# Patient Record
Sex: Male | Born: 1950
Health system: Southern US, Community
[De-identification: ages and names within clinical notes are randomized; demographics above are authoritative.]

## PROBLEM LIST (undated history)

## (undated) DIAGNOSIS — H269 Unspecified cataract: Secondary | ICD-10-CM

## (undated) DIAGNOSIS — M47816 Spondylosis without myelopathy or radiculopathy, lumbar region: Secondary | ICD-10-CM

## (undated) DIAGNOSIS — E785 Hyperlipidemia, unspecified: Secondary | ICD-10-CM

## (undated) DIAGNOSIS — K5909 Other constipation: Secondary | ICD-10-CM

## (undated) DIAGNOSIS — K635 Polyp of colon: Secondary | ICD-10-CM

## (undated) DIAGNOSIS — D563 Thalassemia minor: Secondary | ICD-10-CM

## (undated) DIAGNOSIS — M5136 Other intervertebral disc degeneration, lumbar region: Secondary | ICD-10-CM

## (undated) DIAGNOSIS — K627 Radiation proctitis: Secondary | ICD-10-CM

## (undated) DIAGNOSIS — K219 Gastro-esophageal reflux disease without esophagitis: Secondary | ICD-10-CM

## (undated) DIAGNOSIS — C801 Malignant (primary) neoplasm, unspecified: Secondary | ICD-10-CM

## (undated) DIAGNOSIS — R972 Elevated prostate specific antigen [PSA]: Secondary | ICD-10-CM

## (undated) HISTORY — DX: Spondylosis without myelopathy or radiculopathy, lumbar region: M47.816

## (undated) HISTORY — DX: Thalassemia minor: D56.3

## (undated) HISTORY — PX: PROSTATE SURGERY: SHX751

## (undated) HISTORY — DX: Hyperlipidemia, unspecified: E78.5

## (undated) HISTORY — DX: Other intervertebral disc degeneration, lumbar region: M51.36

## (undated) HISTORY — PX: CATARACT EXTRACTION W/ INTRAOCULAR LENS IMPLANT: SHX1309

## (undated) HISTORY — DX: Elevated prostate specific antigen (PSA): R97.20

---

## 2006-03-26 ENCOUNTER — Emergency Department: Payer: Self-pay | Admitting: Emergency Medicine

## 2007-10-02 ENCOUNTER — Other Ambulatory Visit: Payer: Self-pay

## 2007-10-02 ENCOUNTER — Emergency Department: Payer: Self-pay | Admitting: Emergency Medicine

## 2007-10-17 ENCOUNTER — Ambulatory Visit: Payer: Self-pay | Admitting: Emergency Medicine

## 2013-04-23 HISTORY — PX: CATARACT EXTRACTION W/ INTRAOCULAR LENS IMPLANT: SHX1309

## 2013-11-13 LAB — HM COLONOSCOPY

## 2015-09-09 ENCOUNTER — Ambulatory Visit (INDEPENDENT_AMBULATORY_CARE_PROVIDER_SITE_OTHER): Payer: Medicare Other | Admitting: Family Medicine

## 2015-09-09 ENCOUNTER — Encounter: Payer: Self-pay | Admitting: Family Medicine

## 2015-09-09 VITALS — BP 108/66 | HR 61 | Temp 97.7°F | Ht 67.9 in | Wt 140.6 lb

## 2015-09-09 DIAGNOSIS — E785 Hyperlipidemia, unspecified: Secondary | ICD-10-CM

## 2015-09-09 DIAGNOSIS — Z720 Tobacco use: Secondary | ICD-10-CM | POA: Insufficient documentation

## 2015-09-09 DIAGNOSIS — Z860100 Personal history of colon polyps, unspecified: Secondary | ICD-10-CM | POA: Insufficient documentation

## 2015-09-09 DIAGNOSIS — Z23 Encounter for immunization: Secondary | ICD-10-CM | POA: Diagnosis not present

## 2015-09-09 DIAGNOSIS — M47816 Spondylosis without myelopathy or radiculopathy, lumbar region: Secondary | ICD-10-CM | POA: Insufficient documentation

## 2015-09-09 DIAGNOSIS — M5430 Sciatica, unspecified side: Secondary | ICD-10-CM | POA: Insufficient documentation

## 2015-09-09 DIAGNOSIS — M5431 Sciatica, right side: Secondary | ICD-10-CM

## 2015-09-09 DIAGNOSIS — M479 Spondylosis, unspecified: Secondary | ICD-10-CM | POA: Diagnosis not present

## 2015-09-09 NOTE — Patient Instructions (Addendum)
I will suggest that your children talk to their doctors about getting screening colonoscopies starting at age 65, if not earlier based on their other risk factors Ask your [facility] why they stopped the aspirin Request labs and last 2 years of office notes from [place] colonoscopy report and path report form 2 years ago Good luck quitting smoking; it will be the best thing you can do for your health We'll see you in October for your Medicare visit  Back Exercises Back exercises help treat and prevent back injuries. The goal is to increase your strength in your belly (abdominal) and back muscles. These exercises can also help with flexibility. Start these exercises when told by your doctor. HOME CARE Back exercises include: Pelvic Tilt.  Lie on your back with your knees bent. Tilt your pelvis until the lower part of your back is against the floor. Hold this position 5 to 10 sec. Repeat this exercise 5 to 10 times. Knee to Chest.  Pull 1 knee up against your chest and hold for 20 to 30 seconds. Repeat this with the other knee. This may be done with the other leg straight or bent, whichever feels better. Then, pull both knees up against your chest. Sit-Ups or Curl-Ups.  Bend your knees 90 degrees. Start with tilting your pelvis, and do a partial, slow sit-up. Only lift your upper half 30 to 45 degrees off the floor. Take at least 2 to 3 seonds for each sit-up. Do not do sit-ups with your knees out straight. If partial sit-ups are difficult, simply do the above but with only tightening your belly (abdominal) muscles and holding it as told. Hip-Lift.  Lie on your back with your knees flexed 90 degrees. Push down with your feet and shoulders as you raise your hips 2 inches off the floor. Hold for 10 seconds, repeat 5 to 10 times. Back Arches.  Lie on your stomach. Prop yourself up on bent elbows. Slowly press on your hands, causing an arch in your low back. Repeat 3 to 5  times. Shoulder-Lifts.  Lie face down with arms beside your body. Keep hips and belly pressed to floor as you slowly lift your head and shoulders off the floor. Do not overdo your exercises. Be careful in the beginning. Exercises may cause you some mild back discomfort. If the pain lasts for more than 15 minutes, stop the exercises until you see your doctor. Improvement with exercise for back problems is slow.  Document Released: 01/13/2011 Document Revised: 03/04/2012 Document Reviewed: 10/12/2011 [facility] Patient Information 2015 [place], [place]. This information is not intended to replace advice given to you by your health care provider. Make sure you discuss any questions you have with your health care provider. Smoking Cessation, Tips for Success If you are ready to quit smoking, congratulations! You have chosen to help yourself be healthier. Cigarettes bring nicotine, tar, carbon monoxide, and other irritants into your body. Your lungs, heart, and blood vessels will be able to work better without these poisons. There are many different ways to quit smoking. Nicotine gum, nicotine patches, a nicotine inhaler, or nicotine nasal spray can help with physical craving. Hypnosis, support groups, and medicines help break the habit of smoking. WHAT THINGS CAN I DO TO MAKE QUITTING EASIER?  Here are some tips to help you quit for good:  Pick a date when you will quit smoking completely. Tell all of your friends and family about your plan to quit on that date.  Do  not try to slowly cut down on the number of cigarettes you are smoking. Pick a quit date and quit smoking completely starting on that day.  Throw away all cigarettes.   Clean and remove all ashtrays from your home, work, and car.  On a card, write down your reasons for quitting. Carry the card with you and read it when you get the urge to smoke.  Cleanse your body of nicotine. Drink enough water and fluids to keep your urine clear or pale  yellow. Do this after quitting to flush the nicotine from your body.  Learn to predict your moods. Do not let a bad situation be your excuse to have a cigarette. Some situations in your life might tempt you into wanting a cigarette.  Never have "just one" cigarette. It leads to wanting another and another. Remind yourself of your decision to quit.  Change habits associated with smoking. If you smoked while driving or when feeling stressed, try other activities to replace smoking. Stand up when drinking your coffee. Brush your teeth after eating. Sit in a different chair when you read the paper. Avoid alcohol while trying to quit, and try to drink fewer caffeinated beverages. Alcohol and caffeine may urge you to smoke.  Avoid foods and drinks that can trigger a desire to smoke, such as sugary or spicy foods and alcohol.  Ask people who smoke not to smoke around you.  Have something planned to do right after eating or having a cup of coffee. For example, plan to take a walk or exercise.  Try a relaxation exercise to calm you down and decrease your stress. Remember, you may be tense and nervous for the first 2 weeks after you quit, but this will pass.  Find new activities to keep your hands busy. Play with a pen, coin, or rubber band. Doodle or draw things on paper.  Brush your teeth right after eating. This will help cut down on the craving for the taste of tobacco after meals. You can also try mouthwash.   Use oral substitutes in place of cigarettes. Try using lemon drops, carrots, cinnamon sticks, or chewing gum. Keep them handy so they are available when you have the urge to smoke.  When you have the urge to smoke, try deep breathing.  Designate your home as a nonsmoking area.  If you are a heavy smoker, ask your health care provider about a prescription for nicotine chewing gum. It can ease your withdrawal from nicotine.  Reward yourself. Set aside the cigarette money you save and buy  yourself something nice.  Look for support from others. Join a support group or smoking cessation program. Ask someone at home or at work to help you with your plan to quit smoking.  Always ask yourself, "Do I need this cigarette or is this just a reflex?" Tell yourself, "Today, I choose not to smoke," or "I do not want to smoke." You are reminding yourself of your decision to quit.  Do not replace cigarette smoking with electronic cigarettes (commonly called e-cigarettes). The safety of e-cigarettes is unknown, and some may contain harmful chemicals.  If you relapse, do not give up! Plan ahead and think about what you will do the next time you get the urge to smoke. HOW WILL I FEEL WHEN I QUIT SMOKING? You may have symptoms of withdrawal because your body is used to nicotine (the addictive substance in cigarettes). You may crave cigarettes, be irritable, feel very hungry, cough often,  get headaches, or have difficulty concentrating. The withdrawal symptoms are only temporary. They are strongest when you first quit but will go away within 10-14 days. When withdrawal symptoms occur, stay in control. Think about your reasons for quitting. Remind yourself that these are signs that your body is healing and getting used to being without cigarettes. Remember that withdrawal symptoms are easier to treat than the major diseases that smoking can cause.  Even after the withdrawal is over, expect periodic urges to smoke. However, these cravings are generally short lived and will go away whether you smoke or not. Do not smoke! WHAT RESOURCES ARE AVAILABLE TO HELP ME QUIT SMOKING? Your health care provider can direct you to community resources or hospitals for support, which may include:  Group support.  Education.  Hypnosis.  Therapy. Document Released: 09/08/2004 Document Revised: 04/27/2014 Document Reviewed: 05/29/2013 [facility] Patient Information 2015 [place], [place]. This information is not intended  to replace advice given to you by your health care provider. Make sure you discuss any questions you have with your health care provider.

## 2015-09-09 NOTE — Progress Notes (Signed)
BP 108/66 mmHg  Pulse 61  Temp(Src) 97.7 F (36.5 C)  Ht 5' 7.9" (1.725 m)  Wt 140 lb 9.6 oz (63.776 kg)  BMI 21.43 kg/m2  SpO2 97%   Subjective:    Patient ID: Jerry Pena, male    DOB: 1951-10-18, 64 y.o.   MRN: [id number]  HPI: Jerry Pena is a 64 y.o. male  Chief Complaint  Patient presents with  . Establish Care   He has had sciatica on the right side for three years; tt just started out of the blue; he has tingling in the right leg and the right foot actually goes numb Pain is 24/7, ongoing; nothing makes it worse He was taking 8 tylenol a day but that caused constipation; he switched to Aleve 4 a day which helps some; most days the pain is a 10 out of 10; with the Aleve, the pain comes down to a 5 out of 10; no old injuries to back or hip He guesses it is old age; takes 10 minutes to get going in the mornings, lower back feels stiff; painful if he lifts something heavy or moves the wrong way, pain can keep him in bed for 4-5 days; he tries to be careful No known back problems in the family The [place] did xrays of the back and said there was arthritis, xrays were two years ago; he has an orthopaedist Naproxen Rx chewed up his stomach  He also takes Aleve for his right shoulder; either arthritis or rotator cuff; he is supposed to come back for an MRI to get that scheduled; he has a growth on the right shoulder too, fluid or fatty tissue, not sure; no pain from that; no old injuries; he is right-handed; orthopaedist gave him a shot in the shoulder last November, needs to get another shot  He has a history of high cholesterol, "not really high, just a little elevated"; he is on a small dose, 1/2 daily; last labs were done October 2015; no myalgias; no abdominal pain; tries to watch good diet; eats fish/seafood; eats fruits and veggies  He was taking aspirin daily but they stopped that, not sure why; no bleeding; no fam hx of heart disease; he will call to make an appointment to see  his provider and will ask why  He is going to schedule a colonoscopy; he had 10 polyps and they were removed and needed f/u in 2 years; they might have turned into cancer if left alone  Three years ago, had a blood vessel near his eye burst; did $10,000 worth of work; had double vision, left eye would not move; kept him in the hospital for a whole week; after a few weeks it corrected itself; then had cataracts removed in 2014; okay for a while; still having blurred vision; trouble reading; sees [place] eye doctor  Smoking; has patches and gum from the [place]; on the verge of quitting; started smoking at 24; quit for quite a while; 1/3 ppd  Flu shot UTD  Relevant past medical, surgical, family and social history reviewed and updated as indicated. Interim medical history since our last visit reviewed. Family History  Problem Relation Age of Onset  . Cancer Sister     breast  . Cancer Son     prostate   Allergies and medications reviewed and updated.  Review of Systems  HENT: Negative for nosebleeds.   Eyes: Positive for visual disturbance ("my eyes are terrible").  Respiratory: Negative for shortness of breath.  Cardiovascular: Negative for chest pain.  Gastrointestinal: Negative for abdominal pain and blood in stool.  Musculoskeletal:       Right shoulder pain; limited reaching behind his back; no weakness  Neurological: Positive for numbness (right leg).  Per HPI unless specifically indicated above     Objective:    BP 108/66 mmHg  Pulse 61  Temp(Src) 97.7 F (36.5 C)  Ht 5' 7.9" (1.725 m)  Wt 140 lb 9.6 oz (63.776 kg)  BMI 21.43 kg/m2  SpO2 97%  Wt Readings from Last 3 Encounters:  09/09/15 140 lb 9.6 oz (63.776 kg)    Physical Exam  Constitutional: He appears well-developed and well-nourished. No distress.  HENT:  Head: Normocephalic and atraumatic.  Eyes: EOM are normal. No scleral icterus.  Neck: No thyromegaly present.  Cardiovascular: Normal rate and regular rhythm.    Pulmonary/Chest: Effort normal and breath sounds normal.  Abdominal: Soft. Bowel sounds are normal. He exhibits no distension.  Musculoskeletal: He exhibits no edema.       Right shoulder: He exhibits decreased range of motion.  Neurological: Coordination normal.  Skin: Skin is warm and dry. No pallor.  Psychiatric: He has a normal mood and affect. His behavior is normal. Judgment and thought content normal.    No results found for this or any previous visit.    Assessment & Plan:   Problem List Items Addressed This Visit      Nervous and Auditory   Sciatica - Primary    [facility] by [facility], [facility]        Musculoskeletal and Integument   Arthritis, lumbar spine    Treated by [facility], ortho        Other   Hyperlipidemia    Managed by [facility]; labs done through [place]; encouraged healthy diet      Relevant Medications   atorvastatin (LIPITOR) 20 MG tablet   Tobacco abuse     Encouraged cessation; see AVS for tips given in hand-out      History of colon polyps    F/[facility] colonoscopy with [facility]       Other Visit Diagnoses    Immunization due        Relevant Orders    Flu Vaccine QUAD 36+ mos PF IM (Fluarix & Fluzone Quad PF) (Completed)        Follow up plan: Return in about 3 weeks (around 09/28/2015) for Medicare visit.  An after-visit summary was printed and given to the patient at [place].  Please see the patient instructions which may contain other information and recommendations beyond what is mentioned above in the assessment and plan.

## 2015-09-15 NOTE — Assessment & Plan Note (Signed)
[facility] by [facility]

## 2015-09-15 NOTE — Assessment & Plan Note (Signed)
Managed by [facility]; labs done through [place]; encouraged healthy diet

## 2015-09-15 NOTE — Assessment & Plan Note (Signed)
F/[facility]oscopy with [facility]

## 2015-09-15 NOTE — Assessment & Plan Note (Addendum)
Encouraged cessation; see AVS for tips given in hand-out

## 2015-09-28 ENCOUNTER — Encounter: Payer: Self-pay | Admitting: Family Medicine

## 2015-09-28 ENCOUNTER — Ambulatory Visit (INDEPENDENT_AMBULATORY_CARE_PROVIDER_SITE_OTHER): Payer: Medicare Other | Admitting: Family Medicine

## 2015-09-28 VITALS — BP 105/69 | HR 66 | Temp 96.1°F | Ht 67.25 in | Wt 135.6 lb

## 2015-09-28 DIAGNOSIS — Z720 Tobacco use: Secondary | ICD-10-CM

## 2015-09-28 DIAGNOSIS — Z86010 Personal history of colonic polyps: Secondary | ICD-10-CM

## 2015-09-28 DIAGNOSIS — Z0000 Encounter for general adult medical examination without abnormal findings: Secondary | ICD-10-CM | POA: Insufficient documentation

## 2015-09-28 NOTE — Progress Notes (Signed)
Patient: Jerry Pena, Male    DOB: 1951/10/01, 64 y.o.   MRN: [id number]  Visit Date: 10/03/2015  Today's Provider: Enid Derry, MD   Chief Complaint  Patient presents with  . Medicare Wellness    Pt. reports no problems.    Subjective:   Jerry Pena is a 64 y.o. male who presents today for his [facility] [employer] Visit.  Caregiver input:  n/a  HPI  Here for Medicare Wellness visit  Review of Systems  Constitutional: Negative for unexpected weight change (fluctuates 4-5 pounds either way).  HENT: Negative for hearing loss.   Respiratory: Negative for shortness of breath and wheezing.   Cardiovascular: Negative for chest pain.  Gastrointestinal: Negative for blood in stool.  Genitourinary: Negative for hematuria, decreased urine volume and difficulty urinating.  Musculoskeletal: Positive for arthralgias (3rd MCP on the left hand swelled up; saw [place] staff; no problem for 2 years).  Skin: Negative for rash.  Allergic/Immunologic: Negative for food allergies.  Neurological: Negative for tremors, speech difficulty, light-headedness and headaches.  Psychiatric/Behavioral: Positive for sleep disturbance (sleeps 4-5 hours a night and he's good). Negative for confusion, dysphoric mood and decreased concentration.   USPSTF grade A and B recommendations reviewed in detail together Alcohol; 3-4 beers per week HIV and Hep: testing politely declined by patient Colonoscopy: next due Nov 2016 Flu shot: UTD He had a pneumonia shot: Last year or year before last but not does know what kind (not sure if PCV-13 or PPSV-23); that was through the [place] Exercise: active Diet: eats quite a bit of salad, loves his greens; eats a lot of [place], [place] bacon instead of other meats; lots of seafood; lots of milk, also cheese, yogurt; shredded wheat cereal, raisin bran Tobacco: on patches and gum to help quit AAA: the [facility] ran some dye in him when he was having a problem with the blood vessel that  ruptured in his temple; they sent him through a scanner and had dye running through  Breast: sister had cancer, no lumps Tobacco: less than 30 pack years Aspirin: taking aspirin but the [facility] stopped that 3 years ago, he had some ruptured blood vessel Skin: no worrisome moles  Mother lived to 92 years old; died of a stroke Patient's cholesterol is borderline elevated; they gave him 1/2 pill of cholesterol med at the [place]; no problems Father lived to 20 years old; he was healthy up until the end, natural causes History reviewed. No pertinent past surgical history.  Family History  Problem Relation Age of Onset  . Cancer Sister     breast  . Cancer Son     prostate    Social History   Social History  . Marital Status: Married    Spouse Name: N/A  . Number of Children: N/A  . Years of Education: N/A   Occupational History  . Not on file.   Social History Main Topics  . Smoking status: Light Tobacco Smoker    Types: Cigarettes  . Smokeless tobacco: Never Used  . Alcohol Use: 2.4 oz/week    4 Cans of beer, 0 Standard drinks or equivalent per week  . Drug Use: No  . Sexual Activity: Not on file   Other Topics Concern  . Not on file   Social History Narrative    Outpatient Encounter Prescriptions as of 09/28/2015  Medication Sig  . atorvastatin (LIPITOR) 20 MG tablet Take by mouth. Takes .5 tablet daily  . docusate sodium (COLACE) 50 MG capsule  Take by mouth 2 (two) times daily. Take 2 tablets by mouth twice daily  . pantoprazole (PROTONIX) 40 MG tablet Take 40 mg by mouth daily.   No facility-administered encounter medications on file as of 09/28/2015.    Functional Ability / Safety Screening 1.  Was the timed Get Up and Go test longer than 30 seconds?  no 2.  Does the patient need help with the phone, transportation, shopping,      preparing meals, housework, laundry, medications, or managing money?  no 3.  Does the patient's home have:  loose throw rugs in the hallway?    no      Grab bars in the bathroom? yes      Handrails on the stairs?   n/a      Poor lighting?   no 4.  Has the patient noticed any hearing difficulties?   no  Fall Risk Assessment See under rooming  Depression Screen See under rooming Depression screen [facility] 2/9 09/09/2015  Decreased Interest 0  Down, Depressed, Hopeless 0  PHQ - 2 Score 0   Advanced Directives Does patient have a HCPOA?    yes If yes, name and contact information: oldest son has the info, lives in [place], [phone], [place] Does patient have a living will or MOST form?  yes  Objective:   Vitals: BP 105/69 mmHg  Pulse 66  Temp(Src) 96.1 F (35.6 C)  Ht 5' 7.25" (1.708 m)  Wt 135 lb 9.6 oz (61.508 kg)  BMI 21.08 kg/m2  SpO2 97% Body mass index is 21.08 kg/(m^2).  Visual Acuity Screening   Right eye Left eye Both eyes  Without correction: 20/20 20/20 20/20   With correction:       Physical Exam  Constitutional: He appears well-developed and well-nourished.  HENT:  Head: Normocephalic and atraumatic.  Mouth/Throat: Oropharynx is clear and moist.  Eyes: EOM are normal.  Neck: Neck supple. Carotid bruit is not present. No thyromegaly present.  Cardiovascular: Normal rate and regular rhythm.   Pulmonary/Chest: Effort normal and breath sounds normal.  Abdominal: Soft. He exhibits no distension.  Musculoskeletal: He exhibits no edema.  Neurological: He is alert. He displays no tremor. Gait normal.  Skin: Skin is warm.  Psychiatric: He has a normal mood and affect. His behavior is normal. Judgment and thought content normal.   Mood/affect:  euthymic Appearance:  Casually dressed  Cognitive Testing - 6-CIT  Correct? Score   What year is it? yes 0 Yes = 0    No = 4  What month is it? yes 0 Yes = 0    No = 3  Remember:     [address] [place], [place], [place]     What time is it? yes 0 Yes = 0    No = 3  Count backwards from 20 to 1 yes 0 Correct = 0    1 error = 2   More than 1 error = 4  Say  the months of the year in reverse. yes 0 Correct = 0    1 error = 2   More than 1 error = 4  What address did I ask you to remember? yes 0 Correct = 0  1 error = 2    2 error = 4    3 error = 6    4 error = 8    All wrong = 10       TOTAL SCORE  0/28   Interpretation:  Normal  Normal (0-7) Abnormal (8-28)    Assessment & Plan:     Annual Wellness Visit  Reviewed patient's Family Medical History Reviewed and updated list of patient's medical providers Assessment of cognitive impairment was done Assessed patient's functional ability Established a written schedule for health screening [facility] Completed and Reviewed  Immunization History  Administered Date(s) Administered  . Influenza,inj,Quad PF,36+ Mos 09/09/2015    Health Maintenance  Topic Date Due  . Hepatitis C Screening  [birth year]  . HIV Screening  02/13/1965  . PNA vac Low Risk Adult (2 of 2 - PPSV23) 06/25/2015  . COLONOSCOPY  10/26/2015  . INFLUENZA VACCINE  07/25/2016  . TETANUS/TDAP  06/24/2024  . ZOSTAVAX  Addressed   Discussed health benefits of physical activity, and encouraged him to engage in regular exercise appropriate for his age and condition.    Current outpatient prescriptions:  .  atorvastatin (LIPITOR) 20 MG tablet, Take by mouth. Takes .5 tablet daily, Disp: , Rfl:  .  docusate sodium (COLACE) 50 MG capsule, Take by mouth 2 (two) times daily. Take 2 tablets by mouth twice daily, Disp: , Rfl:  .  pantoprazole (PROTONIX) 40 MG tablet, Take 40 mg by mouth daily., Disp: , Rfl:  There are no discontinued medications.  Next Medicare Wellness Visit in 12+ months  EKG machine was not working, so patient will return for baseline EKG when repaired/replaced  Problem List Items Addressed This Visit      Other   Tobacco abuse    He is actively working on cessation, encouragement given      History of colon polyps    Next colonoscopy due in Nov, done through [place]       Preventative health care - Primary    USPSTF grade A and B recommendations reviewed with patient; age-appropriate recommendations, preventive care, screening tests, etc discussed and encouraged; healthy living encouraged; see AVS for patient education given to patient; return for EKG; request records from [place] to see if PCV-13 vaccine needs to be given        An after-visit summary was printed and given to the patient at [place].  Please see the patient instructions which may contain other information and recommendations beyond what is mentioned above in the assessment and plan.

## 2015-09-28 NOTE — Patient Instructions (Addendum)
Request vaccine record from the [place] office notes from the [place] since 2012 If you received the PPSV-23 (Pneumovax), then you should get a booster of that five years or more from the date of the last shot If you have not already had the PCV-13 (Prevnar), then you should get that shot next Request the tests done at the [place], everything since 2012 Request labs done at the [place] since 2014 We'll see what those show and get back to you If you have not heard back from me in 3 weeks about your PSA and vaccines, then please call me to follow-up  We'll have you return for an EKG when our machine is up and running Return in one year for your next Medicare Wellness visit  Health Maintenance  Topic Date Due  . Hepatitis C Screening  [birth year]  . HIV Screening  02/[birth year]/1965  . PNA vac Low Risk Adult (2 of 2 - PPSV23) 06/25/2015  . COLONOSCOPY  10/26/2015  . INFLUENZA VACCINE  07/25/2016  . TETANUS/TDAP  06/24/2024  . [place] Maintenance A healthy lifestyle and preventative care can promote health and wellness.  Maintain regular health, dental, and eye exams.  Eat a healthy diet. Foods like vegetables, fruits, whole grains, low-fat dairy products, and lean protein foods contain the nutrients you need and are low in calories. Decrease your intake of foods high in solid fats, added sugars, and salt. Get information about a proper diet from your health care provider, if necessary.  Regular physical exercise is one of the most important things you can do for your health. Most adults should get at least 150 minutes of moderate-intensity exercise (any activity that increases your heart rate and causes you to sweat) each week. In addition, most adults need muscle-strengthening exercises on 2 or more days a week.   Maintain a healthy weight. The body mass index (BMI) is a screening tool to identify possible weight problems. It provides an estimate of body fat based on height and weight.  Your health care provider can find your BMI and can help you achieve or maintain a healthy weight. For males 20 years and older:  A BMI below 18.5 is considered underweight.  A BMI of 18.5 to 24.9 is normal.  A BMI of 25 to 29.9 is considered overweight.  A BMI of 30 and above is considered obese.  Maintain normal blood lipids and cholesterol by exercising and minimizing your intake of saturated fat. Eat a balanced diet with plenty of fruits and vegetables. Blood tests for lipids and cholesterol should begin at age 59 and be repeated every 5 years. If your lipid or cholesterol levels are high, you are over age 4, or you are at high risk for heart disease, you may need your cholesterol levels checked more frequently.Ongoing high lipid and cholesterol levels should be treated with medicines if diet and exercise are not working.  If you smoke, find out from your health care provider how to quit. If you do not use tobacco, do not start.  Lung cancer screening is recommended for adults aged 22-80 years who are at high risk for developing lung cancer because of a history of smoking. A yearly low-dose CT scan of the lungs is recommended for people who have at least a 30-pack-year history of smoking and are current smokers or have quit within the past 15 years. A pack year of smoking is smoking an average of 1 pack of cigarettes a  day for 1 year (for example, a 30-pack-year history of smoking could mean smoking 1 pack a day for 30 years or 2 packs a day for 15 years). Yearly screening should continue until the smoker has stopped smoking for at least 15 years. Yearly screening should be stopped for people who develop a health problem that would prevent them from having lung cancer treatment.  If you choose to drink alcohol, do not have more than 2 drinks per day. One drink is considered to be 12 oz (360 mL) of beer, 5 oz (150 mL) of wine, or 1.5 oz (45 mL) of liquor.  Avoid the use of street drugs. Do not  share needles with anyone. Ask for help if you need support or instructions about stopping the use of drugs.  High blood pressure causes heart disease and increases the risk of stroke. Blood pressure should be checked at least every 1-2 years. Ongoing high blood pressure should be treated with medicines if weight loss and exercise are not effective.  If you are 31-77 years old, ask your health care provider if you should take aspirin to prevent heart disease.  Diabetes screening involves taking a blood sample to check your fasting blood sugar level. This should be done once every 3 years after age 31 if you are at a normal weight and without risk factors for diabetes. Testing should be considered at a younger age or be carried out more frequently if you are overweight and have at least 1 risk factor for diabetes.  Colorectal cancer can be detected and often prevented. Most routine colorectal cancer screening begins at the age of 36 and continues through age 69. However, your health care provider may recommend screening at an earlier age if you have risk factors for colon cancer. On a yearly basis, your health care provider may provide home test kits to check for hidden blood in the stool. A small camera at the end of a tube may be used to directly examine the colon (sigmoidoscopy or colonoscopy) to detect the earliest forms of colorectal cancer. Talk to your health care provider about this at age 71 when routine screening begins. A direct exam of the colon should be repeated every 5-10 years through age 72, unless early forms of precancerous polyps or small growths are found.  People who are at an increased risk for hepatitis B should be screened for this virus. You are considered at high risk for hepatitis B if:  You were born in a country where hepatitis B occurs often. Talk with your health care provider about which countries are considered high risk.  Your parents were born in a high-risk country  and you have not received a shot to protect against hepatitis B (hepatitis B vaccine).  You have HIV or AIDS.  You use needles to inject street drugs.  You live with, or have sex with, someone who has hepatitis B.  You are a man who has sex with other men (MSM).  You get hemodialysis treatment.  You take certain medicines for conditions like cancer, organ transplantation, and autoimmune conditions.  Hepatitis C blood testing is recommended for all people born from [birth year] through 1966 and any individual with known risk factors for hepatitis C.  Healthy men should no longer receive prostate-specific antigen (PSA) blood tests as part of routine cancer screening. Talk to your health care provider about prostate cancer screening.  Testicular cancer screening is not recommended for adolescents or adult males who have no  symptoms. Screening includes self-exam, a health care provider exam, and other screening tests. Consult with your health care provider about any symptoms you have or any concerns you have about testicular cancer.  Practice safe sex. Use condoms and avoid high-risk sexual practices to reduce the spread of sexually transmitted infections (STIs).  You should be screened for STIs, including gonorrhea and chlamydia if:  You are sexually active and are younger than 24 years.  You are older than 24 years, and your health care provider tells you that you are at risk for this type of infection.  Your sexual activity has changed since you were last screened, and you are at an increased risk for chlamydia or gonorrhea. Ask your health care provider if you are at risk.  If you are at risk of being infected with HIV, it is recommended that you take a prescription medicine daily to prevent HIV infection. This is called pre-exposure prophylaxis (PrEP). You are considered at risk if:  You are a man who has sex with other men (MSM).  You are a heterosexual man who is sexually active with  multiple partners.  You take drugs by injection.  You are sexually active with a partner who has HIV.  Talk with your health care provider about whether you are at high risk of being infected with HIV. If you choose to begin PrEP, you should first be tested for HIV. You should then be tested every 3 months for as long as you are taking PrEP.  Use sunscreen. Apply sunscreen liberally and repeatedly throughout the day. You should seek shade when your shadow is shorter than you. Protect yourself by wearing long sleeves, pants, a wide-brimmed hat, and sunglasses year round whenever you are outdoors.  Tell your health care provider of new moles or changes in moles, especially if there is a change in shape or color. Also, tell your health care provider if a mole is larger than the size of a pencil eraser.  A one-time screening for abdominal aortic aneurysm (AAA) and surgical repair of large AAAs by ultrasound is recommended for men aged 71-75 years who are current or former smokers.  Stay current with your vaccines (immunizations). Document Released: 06/08/2008 Document Revised: 12/16/2013 Document Reviewed: 05/08/2011 [facility] Patient Information 2015 [place], [place]. This information is not intended to replace advice given to you by your health care provider. Make sure you discuss any questions you have with your health care provider.

## 2015-10-03 NOTE — Assessment & Plan Note (Signed)
Next colonoscopy due in Nov, done through [place]

## 2015-10-03 NOTE — Assessment & Plan Note (Signed)
He is actively working on cessation, encouragement given

## 2015-10-03 NOTE — Assessment & Plan Note (Signed)
USPSTF grade A and B recommendations reviewed with patient; age-appropriate recommendations, preventive care, screening tests, etc discussed and encouraged; healthy living encouraged; see AVS for patient education given to patient; return for EKG; request records from [place] to see if PCV-13 vaccine needs to be given

## 2015-10-14 ENCOUNTER — Telehealth: Payer: Self-pay

## 2015-10-14 NOTE — Telephone Encounter (Signed)
Called patient and notified him that Dr. Sanda Klein said that prescription would have to from the doctor at the [facility]. He agreed and understood. I asked if he had any questions and he asked if he had to get a referral for an eye exam and I told him he did not.

## 2015-10-14 NOTE — Telephone Encounter (Signed)
That needs to go to the doctor who treats him for that; it's not me; it's someone at the [place]; I won't do that

## 2015-10-14 NOTE — Telephone Encounter (Signed)
Fax received from [facility]. Fax says "Patient authorized our pharmacy to contact you on behalf of their order. After speaking with your patient, we have preselected the medication we suggest will help manage their symptoms."  The directions for the medication listed on the fax are: Lidocane 5% Apply 1g to affected area 4 times daily.  Diclofenac Sodium Solution 1.5% Apply 40 drops to affected areas up to 4 times daily.

## 2017-06-06 ENCOUNTER — Ambulatory Visit: Payer: Self-pay | Admitting: Family Medicine

## 2017-06-13 ENCOUNTER — Ambulatory Visit: Payer: Self-pay | Admitting: Family Medicine

## 2017-06-13 ENCOUNTER — Ambulatory Visit (INDEPENDENT_AMBULATORY_CARE_PROVIDER_SITE_OTHER): Payer: Medicare HMO | Admitting: Family Medicine

## 2017-06-13 ENCOUNTER — Encounter: Payer: Self-pay | Admitting: Family Medicine

## 2017-06-13 ENCOUNTER — Ambulatory Visit
Admission: RE | Admit: 2017-06-13 | Discharge: 2017-06-13 | Disposition: A | Discharge status: Home / Self Care | Payer: Medicare HMO | Source: Ambulatory Visit | Attending: Family Medicine | Admitting: Family Medicine

## 2017-06-13 ENCOUNTER — Other Ambulatory Visit: Payer: Self-pay

## 2017-06-13 VITALS — BP 108/64 | HR 94 | Temp 97.9°F | Resp 14 | Wt 136.2 lb

## 2017-06-13 DIAGNOSIS — M5441 Lumbago with sciatica, right side: Secondary | ICD-10-CM

## 2017-06-13 DIAGNOSIS — L821 Other seborrheic keratosis: Secondary | ICD-10-CM

## 2017-06-13 DIAGNOSIS — M51369 Other intervertebral disc degeneration, lumbar region without mention of lumbar back pain or lower extremity pain: Secondary | ICD-10-CM | POA: Insufficient documentation

## 2017-06-13 DIAGNOSIS — Z125 Encounter for screening for malignant neoplasm of prostate: Secondary | ICD-10-CM | POA: Insufficient documentation

## 2017-06-13 DIAGNOSIS — M5136 Other intervertebral disc degeneration, lumbar region: Secondary | ICD-10-CM

## 2017-06-13 DIAGNOSIS — Z86010 Personal history of colonic polyps: Secondary | ICD-10-CM | POA: Diagnosis not present

## 2017-06-13 DIAGNOSIS — Z5181 Encounter for therapeutic drug level monitoring: Secondary | ICD-10-CM

## 2017-06-13 DIAGNOSIS — Z1159 Encounter for screening for other viral diseases: Secondary | ICD-10-CM

## 2017-06-13 DIAGNOSIS — Z720 Tobacco use: Secondary | ICD-10-CM | POA: Diagnosis not present

## 2017-06-13 DIAGNOSIS — Z23 Encounter for immunization: Secondary | ICD-10-CM | POA: Diagnosis not present

## 2017-06-13 DIAGNOSIS — E782 Mixed hyperlipidemia: Secondary | ICD-10-CM

## 2017-06-13 DIAGNOSIS — G8929 Other chronic pain: Secondary | ICD-10-CM | POA: Diagnosis not present

## 2017-06-13 DIAGNOSIS — M47816 Spondylosis without myelopathy or radiculopathy, lumbar region: Secondary | ICD-10-CM | POA: Insufficient documentation

## 2017-06-13 DIAGNOSIS — M545 Low back pain: Secondary | ICD-10-CM | POA: Diagnosis not present

## 2017-06-13 DIAGNOSIS — Z1211 Encounter for screening for malignant neoplasm of colon: Secondary | ICD-10-CM | POA: Diagnosis not present

## 2017-06-13 DIAGNOSIS — M2578 Osteophyte, vertebrae: Secondary | ICD-10-CM | POA: Diagnosis not present

## 2017-06-13 HISTORY — DX: Other intervertebral disc degeneration, lumbar region: M51.36

## 2017-06-13 HISTORY — DX: Other intervertebral disc degeneration, lumbar region without mention of lumbar back pain or lower extremity pain: M51.369

## 2017-06-13 HISTORY — DX: Spondylosis without myelopathy or radiculopathy, lumbar region: M47.816

## 2017-06-13 NOTE — Assessment & Plan Note (Signed)
Check fasting labs another day; encouraged diet low in saturated fats

## 2017-06-13 NOTE — Patient Instructions (Addendum)
Please call [phone] to contact [employer] about their home care management service We'll give you the PCV-13 vaccine today against Pneumonia See your pharmacist for the Shingrix (shingles vaccine) in one month or after Return for fasting labs in the next few weeks Nothing with calories after midnight before you come I've put in referrals for the dermatologist and the gastroenterologist If you have not heard anything from my staff in a week about any orders/referrals/studies from today, please contact us here to follow-up [phone] Try to limit saturated fats in your diet (bologna, hot dogs, barbeque, cheeseburgers, hamburgers, steak, bacon, sausage, cheese, etc.) and get more fresh fruits, vegetables, and whole grains

## 2017-06-13 NOTE — Assessment & Plan Note (Signed)
Check PSA when he returns for labs

## 2017-06-13 NOTE — Assessment & Plan Note (Signed)
Not rechecked, overdue; he would like to see someone here in [place]

## 2017-06-13 NOTE — Assessment & Plan Note (Signed)
Encouraged complete smoking cessation.  

## 2017-06-13 NOTE — Progress Notes (Signed)
BP 108/64   Pulse 94   Temp 97.9 F (36.6 C) (Oral)   Resp 14   Wt 136 lb 3.2 oz (61.8 kg)   SpO2 95%   BMI 21.17 kg/m    Subjective:    Patient ID: Jerry Pena, male    DOB: 1951-05-08, 66 y.o.   MRN: [id number]  HPI: Jerry Pena is a 66 y.o. male  Chief Complaint  Patient presents with  . Follow-up    regarding paperwork from ins company   HPI Patient is here to fill out paperwork for his insurance company; see form He needs PCV-13; no hx of that being given He smokes about 3 cigarettes a month; never more than a pack a week smoker Patient does not have diabetes; no hx of heart disease No recent hospital visits High cholesterol; no abd pain, no muscle aches, just Rx expired; not fasting today Eats a lot of fish and chicken; eats a lot of cheese; does eat eggs; does eat dairy, whole milk to 2% Stays active; eats a lot of shredded wheat Has HCPOA and living will Right foot pain going on for a long time; callus or wart; treated with freeze on, but comes back; gets really tender; just for blanket to touch is sore; left foot is fine; keeps a bandaid on it to keep it soft; using tylenol for pain Has pain in the right leg; all the way down the leg; numbness and tingling; bad some days; pain in the lower back; worse with some movements; iif he bends too fast, aggrevates him for 2 weeks; denies B/B dysfunction  Depression screen [facility] 2/9 06/13/2017 09/09/2015  Decreased Interest 0 0  Down, Depressed, Hopeless 0 0  PHQ - 2 Score 0 0   Relevant past medical, surgical, family and social history reviewed Past Medical History:  Diagnosis Date  . Hyperlipidemia    No past surgical history on file.   Family History  Problem Relation Age of Onset  . Cancer Sister        breast  . Cancer Son        prostate   Social History   Social History  . Marital status: Married    Spouse name: N/A  . Number of children: N/A  . Years of education: N/A   Occupational History  . Not  on file.   Social History Main Topics  . Smoking status: Light Tobacco Smoker    Types: Cigarettes  . Smokeless tobacco: Never Used  . Alcohol use 2.4 oz/week    4 Cans of beer per week  . Drug use: No  . Sexual activity: Not on file   Other Topics Concern  . Not on file   Social History Narrative  . No narrative on file   Interim medical history since last visit reviewed. Allergies and medications reviewed  Review of Systems Per HPI unless specifically indicated above     Objective:    BP 108/64   Pulse 94   Temp 97.9 F (36.6 C) (Oral)   Resp 14   Wt 136 lb 3.2 oz (61.8 kg)   SpO2 95%   BMI 21.17 kg/m   Wt Readings from Last 3 Encounters:  06/13/17 136 lb 3.2 oz (61.8 kg)  09/28/15 135 lb 9.6 oz (61.5 kg)  09/09/15 140 lb 9.6 oz (63.8 kg)    Physical Exam  Constitutional: He appears well-developed and well-nourished. No distress.  Eyes: No scleral icterus.  Cardiovascular: Normal rate.  Pulmonary/Chest: Effort normal.  Abdominal: He exhibits no distension.  Musculoskeletal:       Lumbar back: He exhibits tenderness. He exhibits no swelling, no edema, no deformity and no spasm.  Neurological: He is alert.  Reflex Scores:      Patellar reflexes are 2+ on the right side and 2+ on the left side. LE strength 5/5  Skin: He is not diaphoretic. No pallor.  Verrucous papule on the extensor surface of the 3rd toe on the right foot; tender; no proximal red streaks  Psychiatric: He has a normal mood and affect.    No results found for this or any previous visit.    Assessment & Plan:   Problem List Items Addressed This Visit      Other   Tobacco abuse    Encouraged complete smoking cessation      Prostate cancer screening    Check PSA when he returns for labs      Relevant Orders   PSA   Medication monitoring encounter   Relevant Orders   COMPLETE METABOLIC PANEL WITH GFR   Hyperlipidemia - Primary    Check fasting labs another day; encouraged diet  low in saturated fats      Relevant Orders   Lipid panel   History of colon polyps    Not rechecked, overdue; he would like to see someone here in [place]       Other Visit Diagnoses    Screen for colon cancer       Relevant Orders   Ambulatory referral to Gastroenterology   Need for shingles vaccine       get this in one month at local pharmacy   Need for pneumococcal vaccination       Relevant Orders   Pneumococcal conjugate vaccine 13-valent (Completed)   Verrucous papule       Relevant Orders   Ambulatory referral to Dermatology   Chronic right-sided low back pain with right-sided sciatica       will start with xray and PT, then on to MRI or referral if not improving; always take tylenol per package directions, not to excess   Relevant Medications   acetaminophen (TYLENOL) 500 MG tablet   Other Relevant Orders   DG Lumbar Spine Complete (Completed)   Ambulatory referral to Physical Therapy      Follow up plan: Return in about 1 year (around 06/13/2018) for Medicare Wellness check.  An after-visit summary was printed and given to the patient at [place].  Please see the patient instructions which may contain other information and recommendations beyond what is mentioned above in the assessment and plan.  Meds ordered this encounter  Medications  . acetaminophen (TYLENOL) 500 MG tablet    Sig: Take 500 mg by mouth every 6 (six) hours as needed.    Orders Placed This Encounter  Procedures  . DG Lumbar Spine Complete  . Pneumococcal conjugate vaccine 13-valent  . COMPLETE METABOLIC PANEL WITH GFR  . Lipid panel  . PSA  . Ambulatory referral to Gastroenterology  . Ambulatory referral to Dermatology  . Ambulatory referral to Physical Therapy

## 2017-06-22 ENCOUNTER — Telehealth: Payer: Self-pay | Admitting: Family Medicine

## 2017-06-22 ENCOUNTER — Telehealth: Payer: Self-pay

## 2017-06-22 ENCOUNTER — Other Ambulatory Visit: Payer: Self-pay

## 2017-06-22 ENCOUNTER — Encounter: Payer: Self-pay | Admitting: Family Medicine

## 2017-06-22 DIAGNOSIS — Z1211 Encounter for screening for malignant neoplasm of colon: Secondary | ICD-10-CM

## 2017-06-22 NOTE — Telephone Encounter (Signed)
Gastroenterology Pre-Procedure Review  Request Date:  Requesting Physician: Dr.   PATIENT REVIEW QUESTIONS: The patient responded to the following health history questions as indicated:    1. Are you having any GI issues? no 2. Do you have a personal history of Polyps? yes (2014) 3. Do you have a family history of Colon Cancer or Polyps? no 4. Diabetes Mellitus? no 5. Joint replacements in the past 12 months?no 6. Major health problems in the past 3 months?no 7. Any artificial heart valves, MVP, or defibrillator?no    MEDICATIONS & ALLERGIES:    Patient reports the following regarding taking any anticoagulation/antiplatelet therapy:   Plavix, Coumadin, Eliquis, Xarelto, Lovenox, Pradaxa, Brilinta, or Effient? no Aspirin? no  Patient confirms/reports the following medications:  Current Outpatient Prescriptions  Medication Sig Dispense Refill  . acetaminophen (TYLENOL) 500 MG tablet Take 500 mg by mouth every 6 (six) hours as needed.     No current facility-administered medications for this visit.     Patient confirms/reports the following allergies:  Allergies  Allergen Reactions  . Penicillins Hives    No orders of the defined types were placed in this encounter.   AUTHORIZATION INFORMATION Primary Insurance: 1D#: Group #:  Secondary Insurance: 1D#: Group #:  SCHEDULE INFORMATION: Date: 08/02/17 Time: Location: [facility]

## 2017-06-22 NOTE — Telephone Encounter (Signed)
Pt said that the dr told him to call if the pain was any worse. He said that since June 26th he has taken 20 to 24 tylenol and ibuprophen switching back and forth. Please advise. Michela Pitcher he is going to try heating pad.

## 2017-06-22 NOTE — Telephone Encounter (Signed)
Please call and find out if he has been scheduled with Dermatology yet. If not, please have Kristeen Miss look into getting this referral scheduled quickly. If pain is significantly worse, then he needs to be re-seen. Thanks!

## 2017-06-25 NOTE — Telephone Encounter (Signed)
[facility] VOICE MAIL

## 2017-06-26 MED ORDER — PREDNISONE 20 MG PO TABS: ORAL_TABLET | ORAL | 0 refills | Status: AC

## 2017-06-26 NOTE — Telephone Encounter (Signed)
I called number back and he doesn't live there any more I called mobile number; spoke with patient It's not the wart that's causing pain; it's the sciatica He has taken tylenol and ibuprofen Let's start prednisone taper, [facility] confirmed Do NOT take any ibuprofen while on prednisone; okay to take tylenol Call us back if we need to go further (PT, xrays, referral, etc.); he agrees

## 2017-06-26 NOTE — Telephone Encounter (Signed)
Staff -- please remove the [phone] number; he does not live there; thank you

## 2017-06-26 NOTE — Addendum Note (Signed)
Addended by: LADA, Satira Anis on: 06/26/2017 06:26 PM   Modules accepted: Orders

## 2017-07-11 ENCOUNTER — Telehealth: Payer: Self-pay | Admitting: Family Medicine

## 2017-07-11 NOTE — Telephone Encounter (Signed)
Patient notified information sent and was given phone number to [facility]

## 2017-07-11 NOTE — Telephone Encounter (Signed)
PT SAID THAT HE TOOK ALL THE PREDISONE AND THAT HIS PAIN IS BACK AS BAD AS BEFORE. ALSO HE HAS NOT HEARD ANYTHING FROM THE REFERRAL TO THE DERMATOLOGY. PLEASE ADVISE. PT SAID THAT ALL THE REFERRALS HAVE BEEN APPROVED PER HIS INSURANCE COMPANY.

## 2017-07-12 NOTE — Telephone Encounter (Signed)
Left detailed voicemail

## 2017-07-12 NOTE — Telephone Encounter (Signed)
Please suggest he go to the [facility] walk in clinic this afternoon; he likely needs imaging; may have disc problems, pinched nerve, needs to see ortho if my treatments have not given him relief; thank you

## 2017-07-30 ENCOUNTER — Telehealth: Payer: Self-pay

## 2017-07-30 NOTE — Telephone Encounter (Signed)
Patient needed to cancel colonoscopy 08/02/17 due to transportation.  He uses [facility] but did not have anyone to stay with him for procedure.

## 2017-08-02 ENCOUNTER — Ambulatory Visit: Admit: 2017-08-02 | Payer: Medicare HMO | Admitting: Gastroenterology

## 2017-08-02 SURGERY — COLONOSCOPY WITH PROPOFOL
Anesthesia: General

## 2017-08-23 ENCOUNTER — Telehealth: Payer: Self-pay | Admitting: Family Medicine

## 2017-08-23 NOTE — Telephone Encounter (Signed)
Patient notified

## 2017-08-23 NOTE — Telephone Encounter (Signed)
Please ask patient to have the fasting labs done that were ordered in June; thank you

## 2017-08-28 DIAGNOSIS — L84 Corns and callosities: Secondary | ICD-10-CM | POA: Diagnosis not present

## 2017-09-03 ENCOUNTER — Ambulatory Visit (INDEPENDENT_AMBULATORY_CARE_PROVIDER_SITE_OTHER): Payer: Medicare HMO

## 2017-09-03 DIAGNOSIS — Z23 Encounter for immunization: Secondary | ICD-10-CM | POA: Diagnosis not present

## 2017-09-03 DIAGNOSIS — Z125 Encounter for screening for malignant neoplasm of prostate: Secondary | ICD-10-CM | POA: Diagnosis not present

## 2017-09-03 DIAGNOSIS — Z1159 Encounter for screening for other viral diseases: Secondary | ICD-10-CM | POA: Diagnosis not present

## 2017-09-03 DIAGNOSIS — Z5181 Encounter for therapeutic drug level monitoring: Secondary | ICD-10-CM | POA: Diagnosis not present

## 2017-09-03 DIAGNOSIS — E782 Mixed hyperlipidemia: Secondary | ICD-10-CM | POA: Diagnosis not present

## 2017-09-03 LAB — COMPLETE METABOLIC PANEL WITH GFR
AG RATIO: 1.6 (calc) (ref 1.0–2.5)
ALT: 11 U/L (ref 9–46)
AST: 16 U/L (ref 10–35)
Albumin: 4.4 g/dL (ref 3.6–5.1)
Alkaline phosphatase (APISO): 63 U/L (ref 40–115)
BUN: 17 mg/dL (ref 7–25)
CALCIUM: 9.8 mg/dL (ref 8.6–10.3)
CHLORIDE: 105 mmol/L (ref 98–110)
CO2: 30 mmol/L (ref 20–32)
Creat: 1.02 mg/dL (ref 0.70–1.25)
GFR, EST NON AFRICAN AMERICAN: 76 mL/min/{1.73_m2} (ref >=60)
GFR, Est African American: 88 mL/min/{1.73_m2} (ref >=60)
GLOBULIN: 2.7 g/dL (ref 1.9–3.7)
Glucose, Bld: 85 mg/dL (ref 65–99)
POTASSIUM: 4.7 mmol/L (ref 3.5–5.3)
Sodium: 142 mmol/L (ref 135–146)
Total Bilirubin: 0.4 mg/dL (ref 0.2–1.2)
Total Protein: 7.1 g/dL (ref 6.1–8.1)

## 2017-09-03 LAB — PSA: PSA: 4.4 ng/mL — ABNORMAL HIGH (ref <=4.0)

## 2017-09-03 LAB — HEPATITIS C ANTIBODY
HEP C AB: NONREACTIVE
SIGNAL TO CUT-OFF: 0.03 (ref <1.00)

## 2017-09-03 LAB — LIPID PANEL
Cholesterol: 191 mg/dL (ref <200)
HDL: 71 mg/dL (ref >40)
LDL Cholesterol (Calc): 100 mg/dL (calc) — ABNORMAL HIGH
NON-HDL CHOLESTEROL (CALC): 120 mg/dL (ref <130)
Total CHOL/HDL Ratio: 2.7 (calc) (ref <5.0)
Triglycerides: 106 mg/dL (ref <150)

## 2017-09-12 ENCOUNTER — Other Ambulatory Visit: Payer: Self-pay

## 2017-09-12 ENCOUNTER — Encounter: Payer: Self-pay | Admitting: Family Medicine

## 2017-09-12 DIAGNOSIS — R972 Elevated prostate specific antigen [PSA]: Secondary | ICD-10-CM | POA: Insufficient documentation

## 2017-09-12 HISTORY — DX: Elevated prostate specific antigen (PSA): R97.20

## 2017-09-17 ENCOUNTER — Encounter: Payer: Self-pay | Admitting: Family Medicine

## 2017-10-02 ENCOUNTER — Encounter: Payer: Self-pay | Admitting: Urology

## 2017-10-02 ENCOUNTER — Ambulatory Visit (INDEPENDENT_AMBULATORY_CARE_PROVIDER_SITE_OTHER): Payer: Medicare HMO | Admitting: Urology

## 2017-10-02 VITALS — BP 106/69 | HR 66 | Ht 67.25 in | Wt 138.1 lb

## 2017-10-02 DIAGNOSIS — R972 Elevated prostate specific antigen [PSA]: Secondary | ICD-10-CM

## 2017-10-02 LAB — URINALYSIS, COMPLETE
Bilirubin, UA: NEGATIVE
GLUCOSE, UA: NEGATIVE
KETONES UA: NEGATIVE
LEUKOCYTES UA: NEGATIVE
NITRITE UA: NEGATIVE
RBC UA: NEGATIVE
SPEC GRAV UA: 1.025 (ref 1.005–1.030)
UUROB: 0.2 mg/dL (ref 0.2–1.0)
pH, UA: 5.5 (ref 5.0–7.5)

## 2017-10-02 NOTE — Progress Notes (Signed)
10/02/2017 9:59 AM   Arma Heading 1951/01/06 [id number]  Referring provider: Arnetha Courser, MD [address] [place], [place] [address]  Chief Complaint  Patient presents with  . Elevated PSA    HPI: 35 AAM presents for further evaluation and management of an elevated PSA.  This was noted to be elevated as part of a routine annual physical.  He denies any progression of his urinary tract symptoms over the last 6 months.  He has no history of gross hematuria or recurrent UTIs.  He has no significant urologic history.    His son was diagnosed with prostate cancer at the age of 56 (gleason 3+4) and underwent RALP.  SHIM 20 IPSS: 5, QOL 0   PMH: Past Medical History:  Diagnosis Date  . DDD (degenerative disc disease), lumbar 06/13/2017   Lumbar imaging June 2018  . Elevated PSA 09/12/2017   Refer to urologist, Sept 2018  . Facet hypertrophy of lumbar region 06/13/2017   Lumbar imaging June 2018  . Hyperlipidemia     Surgical History: History reviewed. No pertinent surgical history.  Home Medications:  Allergies as of 10/02/2017      Reactions   Penicillins Hives      Medication List       Accurate as of 10/02/17  9:59 AM. Always use your most recent med list.          acetaminophen 500 MG tablet Commonly known as:  TYLENOL Take 500 mg by mouth every 6 (six) hours as needed.       Allergies:  Allergies  Allergen Reactions  . Penicillins Hives    Family History: Family History  Problem Relation Age of Onset  . Cancer Sister        breast  . Cancer Son        prostate  . Prostate cancer Son   . Bladder Cancer Neg Hx   . Kidney cancer Neg Hx     Social History:  reports that he has been smoking Cigarettes.  He has never used smokeless tobacco. He reports that he drinks about 2.4 oz of alcohol per week . He reports that he does not use drugs.  ROS: UROLOGY Frequent Urination?: No Hard to postpone urination?: No Burning/pain with  urination?: No Get up at night to urinate?: No Leakage of urine?: No Urine stream starts and stops?: No Trouble starting stream?: No Do you have to strain to urinate?: No Blood in urine?: No Urinary tract infection?: No Sexually transmitted disease?: No Injury to kidneys or bladder?: No Painful intercourse?: No Weak stream?: No Erection problems?: No Penile pain?: No  Gastrointestinal Nausea?: No Vomiting?: No Indigestion/heartburn?: Yes Diarrhea?: No Constipation?: No  Constitutional Fever: No Night sweats?: No Weight loss?: No Fatigue?: No  Skin Skin rash/lesions?: No Itching?: No  Eyes Blurred vision?: Yes Double vision?: No  Ears/Nose/Throat Sore throat?: No Sinus problems?: No  Hematologic/Lymphatic Swollen glands?: No Easy bruising?: No  Cardiovascular Leg swelling?: No Chest pain?: No  Respiratory Cough?: No Shortness of breath?: No  Endocrine Excessive thirst?: No  Musculoskeletal Back pain?: Yes Joint pain?: Yes  Neurological Headaches?: No Dizziness?: No  Psychologic Depression?: No Anxiety?: No  Physical Exam: BP 106/69 (BP Location: Right Arm, Patient Position: Sitting, Cuff Size: Normal)   Pulse 66   Ht 5' 7.25" (1.708 m)   Wt 62.6 kg (138 lb 1.6 oz)   BMI 21.47 kg/m   Constitutional:  Alert and oriented, No acute distress. HEENT:  [place] AT, moist mucus membranes.  Trachea midline, no masses. Cardiovascular: No clubbing, cyanosis, or edema. Respiratory: Normal respiratory effort, no increased work of breathing. GI: Abdomen is soft, nontender, nondistended, no abdominal masses GU: No CVA tenderness.  DRE: 35gm prostate with nodule on right lateral lob, mid gland Skin: No rashes, bruises or suspicious lesions. Lymph: No cervical or inguinal adenopathy. Neurologic: Grossly intact, no focal deficits, moving all 4 extremities. Psychiatric: Normal mood and affect.  Laboratory Data: No results found for: WBC, HGB, HCT, MCV,  PLT  Lab Results  Component Value Date   CREATININE 1.02 09/03/2017    No results found for: PSA1  No results found for: TESTOSTERONE  No results found for: HGBA1C  Urinalysis No results found for: SPECGRAV, PHUR, COLORU, APPEARANCEUR, LEUKOCYTESUR, PROTEINUR, GLUCOSEU, KETONESU, RBCU, BILIRUBINUR, UUROB, NITRITE  No results found for: LABMICR, WBCUA, RBCUA, LABEPIT, MUCUS, BACTERIA  Pertinent Imaging: none No results found for this or any previous visit. No results found for this or any previous visit. No results found for this or any previous visit. No results found for this or any previous visit. No results found for this or any previous visit. No results found for this or any previous visit. No results found for this or any previous visit. No results found for this or any previous visit.  Assessment & Plan:  Patient with elevated PSA once, no PSA's prior to compare to.  His DRE was concerning for a nodule on the right.  He has a strong family history of prostate cancer.  I am concerned and think he will need a biopsy in the near future.  However, we will check his PSA today and a again in 3 months.  At that visit we will repeat his rectal exam.  I did discuss prostate biopsy with him briefly.  He understands that next visit will likely be further discussion about this.  1. Elevated PSA  - Urinalysis, Complete - PSA   No Follow-up on file.  Ardis Hughs, [facility] [address] [place], [place] [address] [phone]

## 2017-10-03 LAB — PSA: PROSTATE SPECIFIC AG, SERUM: 7.5 ng/mL — AB (ref 0.0–4.0)

## 2017-10-15 ENCOUNTER — Telehealth: Payer: Self-pay

## 2017-10-15 NOTE — Telephone Encounter (Signed)
Called patient and notified of vmail patient was previously scheduled and has a prostate biopsy tomorrow he will keep apt

## 2017-10-15 NOTE — Telephone Encounter (Signed)
-----   Message from Ardis Hughs, MD sent at 10/12/2017  6:59 AM EDT ----- Regarding: Elevated PSA Keina Mutch, This patient's PSA is significantly elevated compared to his initial PSA, he has a nodule, and he has a strong family history.  My initial plan was to repeat his PSA in 3 months.  I think he needs a biopsy sooner.  Can you help arrange this?  I would recommend that he be put on Dr. Cherrie Gauze schedule, because he's likely to need treatment.  Thanks, Suezanne Jacquet   ----- Message ----- From: Leia Alf, CMA Sent: 10/03/2017   8:48 AM To: Ardis Hughs, MD    ----- Message ----- From: Interface, [facility] Lab Results In Sent: 10/02/2017  11:39 AM To: Rowe [facility]

## 2017-10-16 ENCOUNTER — Ambulatory Visit (INDEPENDENT_AMBULATORY_CARE_PROVIDER_SITE_OTHER): Payer: Medicare HMO | Admitting: Urology

## 2017-10-16 ENCOUNTER — Encounter: Payer: Self-pay | Admitting: Urology

## 2017-10-16 ENCOUNTER — Other Ambulatory Visit: Payer: Self-pay | Admitting: Urology

## 2017-10-16 VITALS — BP 109/62 | HR 73 | Ht 67.0 in | Wt 138.0 lb

## 2017-10-16 DIAGNOSIS — N4289 Other specified disorders of prostate: Secondary | ICD-10-CM

## 2017-10-16 DIAGNOSIS — R972 Elevated prostate specific antigen [PSA]: Secondary | ICD-10-CM

## 2017-10-16 DIAGNOSIS — C61 Malignant neoplasm of prostate: Secondary | ICD-10-CM

## 2017-10-16 MED ORDER — GENTAMICIN SULFATE 40 MG/ML IJ SOLN
80.0000 mg | Freq: Once | INTRAMUSCULAR | Status: AC
  Administered 2017-10-16: 80 mg via INTRAMUSCULAR

## 2017-10-16 MED ORDER — LEVOFLOXACIN 500 MG PO TABS
500.0000 mg | ORAL_TABLET | Freq: Once | ORAL | Status: AC
  Administered 2017-10-16: 500 mg via ORAL

## 2017-10-22 ENCOUNTER — Other Ambulatory Visit: Payer: Self-pay | Admitting: Urology

## 2017-10-22 LAB — PATHOLOGY REPORT

## 2017-10-23 ENCOUNTER — Telehealth: Payer: Self-pay | Admitting: Gastroenterology

## 2017-10-23 NOTE — Telephone Encounter (Signed)
Patient LVM and is ready to r/s his procedure.

## 2017-10-24 NOTE — Telephone Encounter (Signed)
Pt stated that he is waiting for his PSA results to come back.  He would like to have his Colonoscopy before Thanksgiving.  He will call back to reschedule after he gets his results.  Thanks [employer]

## 2017-10-25 DIAGNOSIS — C801 Malignant (primary) neoplasm, unspecified: Secondary | ICD-10-CM

## 2017-10-25 HISTORY — DX: Malignant (primary) neoplasm, unspecified: C80.1

## 2017-10-29 NOTE — Progress Notes (Signed)
Prostate Biopsy Procedure   Informed consent was obtained after discussing risks/benefits of the procedure.  A time out was performed to ensure correct patient identity.  Pre-Procedure: - Last PSA Level: 7.5   - Gentamicin given prophylactically - Levaquin 500 mg administered PO -Transrectal Ultrasound performed revealing a 35 gm prostate -No significant hypoechoic or median lobe noted  Procedure: - Prostate block performed using 10 cc 1% lidocaine and biopsies taken from sextant areas, a total of 12 under ultrasound guidance.  Post-Procedure: - Patient tolerated the procedure well - He was counseled to seek immediate medical attention if experiences any severe pain, significant bleeding, or fevers - Return in one week to discuss biopsy results

## 2017-10-30 ENCOUNTER — Ambulatory Visit: Payer: Medicare HMO | Admitting: Urology

## 2017-10-31 ENCOUNTER — Encounter: Payer: Self-pay | Admitting: Urology

## 2017-10-31 ENCOUNTER — Ambulatory Visit (INDEPENDENT_AMBULATORY_CARE_PROVIDER_SITE_OTHER): Payer: Medicare HMO | Admitting: Urology

## 2017-10-31 VITALS — BP 102/66 | HR 67 | Ht 67.0 in | Wt 141.7 lb

## 2017-10-31 DIAGNOSIS — C61 Malignant neoplasm of prostate: Secondary | ICD-10-CM | POA: Diagnosis not present

## 2017-10-31 NOTE — Progress Notes (Signed)
10/31/2017 9:55 AM   Arma Heading February 26, 1951 [id number]  Referring provider: Arnetha Courser, MD [address] [place], [place] [address]  Chief Complaint  Patient presents with  . Follow-up    biopsy results    HPI: 66 year old male presents for prostate biopsy follow-up.  Prostate biopsy was performed on 10/16/2017 for PSA of 7.5.  Prostate volume was 35 cc.  Standard 12 core biopsies were performed.  He had no postbiopsy complaints.  Pathology: 7/12 cores positive for Gleason 3+3/3+4 adenocarcinoma with the most extensive disease on the right side.  Diagnosis:  A. PROSTATIC ADENOCARCINOMA. GLEASON'S SCORE 6 (GRADES 3 + 3) NOTED IN 1  OUT OF 1 SUBMITTED PROSTATE CORE SEGMENTS. APPROXIMATELY 7% OF SUBMITTED  TISSUE INVOLVED.  B. PROSTATIC ADENOCARCINOMA. GLEASON'S SCORE 6 (GRADES 3 + 3) NOTED IN 1  OUT OF 1 SUBMITTED PROSTATE CORE SEGMENTS. APPROXIMATELY 8% OF SUBMITTED  TISSUE INVOLVED.  C. PROSTATIC ADENOCARCINOMA. GLEASON'S SCORE 6 (GRADES 3 + 3) NOTED IN 1  OUT OF 1 SUBMITTED PROSTATE CORE SEGMENTS. APPROXIMATELY 30% OF SUBMITTED  TISSUE INVOLVED.  D. BENIGN PROSTATIC TISSUE AND TISSUE CONSISTENT WITH EITHER SEMINAL  VESICLE OR EJACULATORY DUCT ORIGIN. NO EVIDENCE OF MALIGNANCY.  E. BENIGN PROSTATE TISSUE. NO EVIDENCE OF MALIGNANCY.  F. PROSTATIC ADENOCARCINOMA. GLEASON'S SCORE 7 (GRADES 3 + 4) NOTED IN 1  OUT OF 1 SUBMITTED PROSTATE CORE SEGMENTS. APPROXIMATELY 95% OF SUBMITTED  TISSUE INVOLVED. GLEASON GRADE 4 COMPRISES 5% OR LESS OF THE TUMOR.  PERINEURAL INVASION IS PRESENT. (GRADE GROUP 2)  G. PROSTATIC TISSUE CONTAINING A SMALL FOCUS OF ATYPICAL GLANDS SUSPICIOUS  BUT NOT DIAGNOSTIC OF ADENOCARCINOMA.  H. PROSTATIC ADENOCARCINOMA. GLEASON'S SCORE 7 (GRADES 3 + 4) NOTED IN 1  OUT OF 1 SUBMITTED PROSTATE CORE SEGMENTS. APPROXIMATELY 5% OF SUBMITTED  TISSUE INVOLVED. GLEASON GRADE 4 COMPRISES 5% OR LESS OF THE TUMOR.  I. BENIGN PROSTATE TISSUE.  NO EVIDENCE OF MALIGNANCY.  [place] TISSUE CONSISTENT WITH PROSTATIC STROMA. NO  EVIDENCE OF MALIGNANCY.  K. PROSTATIC ADENOCARCINOMA. GLEASON'S SCORE 7 (GRADES 3 + 4) NOTED IN 1  OUT OF 1 SUBMITTED PROSTATE CORE SEGMENTS. APPROXIMATELY 64% OF SUBMITTED  TISSUE INVOLVED. GLEASON GRADE 4 APPROACHES 50% OF THE TUMOR. PERINEURAL  INVASION IS PRESENT. (GRADE GROUP 2)  L. PROSTATIC ADENOCARCINOMA. GLEASON'S SCORE 7 (GRADES 3 + 4) NOTED IN 1  OUT OF 1 SUBMITTED PROSTATE CORE SEGMENTS. APPROXIMATELY 56% OF SUBMITTED  TISSUE INVOLVED. GLEASON GRADE 4 COMPRISES 30% OF THE TUMOR. PERINEURAL  INVASION IS PRESENT.   PMH: Past Medical History:  Diagnosis Date  . DDD (degenerative disc disease), lumbar 06/13/2017   Lumbar imaging June 2018  . Elevated PSA 09/12/2017   Refer to urologist, Sept 2018  . Facet hypertrophy of lumbar region 06/13/2017   Lumbar imaging June 2018  . Hyperlipidemia     Surgical History: History reviewed. No pertinent surgical history.  Home Medications:  Allergies as of 10/31/2017      Reactions   Penicillins Hives      Medication List        Accurate as of 10/31/17  9:55 AM. Always use your most recent med list.          acetaminophen 500 MG tablet Commonly known as:  TYLENOL Take 500 mg by mouth every 6 (six) hours as needed.       Allergies:  Allergies  Allergen Reactions  . Penicillins Hives    Family History: Family History  Problem Relation Age of  Onset  . Cancer Sister        breast  . Cancer Son        prostate  . Prostate cancer Son   . Bladder Cancer Neg Hx   . Kidney cancer Neg Hx     Social History:  reports that he has been smoking cigarettes.  he has never used smokeless tobacco. He reports that he drinks about 2.4 oz of alcohol per week. He reports that he does not use drugs.  ROS: UROLOGY Frequent Urination?: No Hard to postpone urination?: No Burning/pain with urination?: No Get up at night to urinate?:  No Leakage of urine?: No Urine stream starts and stops?: No Trouble starting stream?: No Do you have to strain to urinate?: No Blood in urine?: No Urinary tract infection?: No Sexually transmitted disease?: No Injury to kidneys or bladder?: No Painful intercourse?: No Weak stream?: No Erection problems?: No Penile pain?: No  Gastrointestinal Nausea?: No Vomiting?: No Indigestion/heartburn?: No Diarrhea?: No Constipation?: No  Constitutional Fever: No Night sweats?: No Weight loss?: No Fatigue?: No  Skin Skin rash/lesions?: No Itching?: No  Eyes Blurred vision?: No Double vision?: No  Ears/Nose/Throat Sore throat?: No Sinus problems?: No  Hematologic/Lymphatic Swollen glands?: No Easy bruising?: No  Cardiovascular Leg swelling?: No Chest pain?: No  Respiratory Cough?: No Shortness of breath?: No  Endocrine Excessive thirst?: No  Musculoskeletal Back pain?: No Joint pain?: No  Neurological Headaches?: No Dizziness?: No  Psychologic Depression?: No Anxiety?: No  Physical Exam: BP 102/66 (BP Location: Right Arm, Patient Position: Sitting, Cuff Size: Normal)   Pulse 67   Ht 5\' 7"  (1.702 m)   Wt 141 lb 11.2 oz (64.3 kg)   BMI 22.19 kg/m   Constitutional:  Alert and oriented, No acute distress. HEENT: [place] AT, moist mucus membranes.  Trachea midline, no masses. Cardiovascular: No clubbing, cyanosis, or edema. Respiratory: Normal respiratory effort, no increased work of breathing. GI: Abdomen is soft, nontender, nondistended, no abdominal masses GU: No CVA tenderness.  Skin: No rashes, bruises or suspicious lesions. Lymph: No cervical or inguinal adenopathy. Neurologic: Grossly intact, no focal deficits, moving all 4 extremities. Psychiatric: Normal mood and affect.  Laboratory Data:  Lab Results  Component Value Date   CREATININE 1.02 09/03/2017    Lab Results  Component Value Date   PSA1 7.5 (H) 10/02/2017   Urinalysis Lab  Results  Component Value Date   SPECGRAV 1.025 10/02/2017   PHUR 5.5 10/02/2017   COLORU Yellow 10/02/2017   APPEARANCEUR Clear 10/02/2017   LEUKOCYTESUR Negative 10/02/2017   PROTEINUR Trace (A) 10/02/2017   GLUCOSEU Negative 10/02/2017   KETONESU Negative 10/02/2017   RBCU Negative 10/02/2017   BILIRUBINUR Negative 10/02/2017   UUROB 0.2 10/02/2017   NITRITE Negative 10/02/2017    Assessment & Plan:    1.  Adenocarcinoma prostate Clinical stage [id number].  CAPRA score is 4 indicating intermediate risk disease.  The pathology report was discussed with Mr. Apfel.  Management options were discussed in detail including curative options of radical prostatectomy and radiation modalities including IMRT and brachytherapy.  Active surveillance was discussed but not recommended.  He was informed at this point there is not considered a "best" treatment for prostate cancer based on the lack of randomized studies.  He indicated right away that he was interested in radical prostatectomy. Will schedule him an appointment with Dr. Erlene Quan for further discussion.  PT referral was placed.   Abbie Sons, [facility]  [address] [place], [place] [address] [phone]

## 2017-11-13 ENCOUNTER — Encounter: Payer: Self-pay | Admitting: Urology

## 2017-11-13 ENCOUNTER — Ambulatory Visit (INDEPENDENT_AMBULATORY_CARE_PROVIDER_SITE_OTHER): Payer: Medicare HMO | Admitting: Urology

## 2017-11-13 VITALS — BP 111/73 | HR 92 | Ht 67.0 in | Wt 139.8 lb

## 2017-11-13 DIAGNOSIS — C61 Malignant neoplasm of prostate: Secondary | ICD-10-CM | POA: Diagnosis not present

## 2017-11-13 NOTE — Progress Notes (Signed)
11/13/2017 8:56 AM   Jerry Pena 18-Feb-1951 [id number]  Referring provider: Arnetha Courser, MD [address] [place], [place] [address]  Chief Complaint  Patient presents with  . Prostate Cancer    HPI: 66 year old male who presents today for consideration of radical robotic prostatectomy.  He was initially seen and evaluated for elevated PSA of 7.5.  He underwent prostate biopsy which revealed 7 of 12 cores positive for malignancy, Gleason 3+4 on the right involving the apex and right lateral mid (up to 95% of tissue) as well as Gleason 3+3 and 3+4 on the contralateral side with lower volume disease.  TRUS volume 35 cc.  On rectal exam, nodule was noted at the right lateral lobe as well as mid gland.  SHIM 20 IPSS: 5, QOL 0  He does have a family history of prostate cancer.  Of note, his son was diagnosed with prostate cancer at age 41, Gleason 3+4 and underwent robotic laparoscopic radical prostatectomy.  No previous abdominal surgeries.  PMH: Past Medical History:  Diagnosis Date  . DDD (degenerative disc disease), lumbar 06/13/2017   Lumbar imaging June 2018  . Elevated PSA 09/12/2017   Refer to urologist, Sept 2018  . Facet hypertrophy of lumbar region 06/13/2017   Lumbar imaging June 2018  . Hyperlipidemia     Surgical History: History reviewed. No pertinent surgical history.  Home Medications:  Allergies as of 11/13/2017      Reactions   Penicillins Hives      Medication List        Accurate as of 11/13/17  8:56 AM. Always use your most recent med list.          acetaminophen 500 MG tablet Commonly known as:  TYLENOL Take 500 mg by mouth every 6 (six) hours as needed.       Allergies:  Allergies  Allergen Reactions  . Penicillins Hives    Family History: Family History  Problem Relation Age of Onset  . Cancer Sister        breast  . Cancer Son        prostate  . Prostate cancer Son   . Bladder Cancer Neg Hx   . Kidney cancer  Neg Hx     Social History:  reports that he has been smoking cigarettes.  he has never used smokeless tobacco. He reports that he drinks about 2.4 oz of alcohol per week. He reports that he does not use drugs.  ROS: UROLOGY Frequent Urination?: No Hard to postpone urination?: No Burning/pain with urination?: No Get up at night to urinate?: No Leakage of urine?: No Urine stream starts and stops?: No Trouble starting stream?: No Do you have to strain to urinate?: No Blood in urine?: No Urinary tract infection?: No Sexually transmitted disease?: No Injury to kidneys or bladder?: No Painful intercourse?: No Weak stream?: No Erection problems?: No Penile pain?: No  Gastrointestinal Nausea?: No Vomiting?: No Indigestion/heartburn?: No Diarrhea?: No Constipation?: No  Constitutional Fever: No Weight loss?: No Fatigue?: No  Skin Skin rash/lesions?: No Itching?: No  Eyes Blurred vision?: No  Ears/Nose/Throat Sore throat?: No Sinus problems?: No  Hematologic/Lymphatic Swollen glands?: No Easy bruising?: No  Cardiovascular Chest pain?: No  Respiratory Cough?: No Shortness of breath?: No     Musculoskeletal Back pain?: No Joint pain?: No  Neurological Dizziness?: No  Psychologic Depression?: No Anxiety?: No  Physical Exam: BP 111/73 (BP Location: Right Arm, Patient Position: Sitting, Cuff Size: Normal)  Pulse 92   Ht 5\' 7"  (1.702 m)   Wt 139 lb 12.8 oz (63.4 kg)   BMI 21.90 kg/m   Constitutional:  Alert and oriented, No acute distress. HEENT: [place] AT, moist mucus membranes.  Trachea midline, no masses. Cardiovascular: No clubbing, cyanosis, or edema. Respiratory: Normal respiratory effort, no increased work of breathing. GI: Abdomen is soft, nontender, nondistended, no abdominal masses. No abdominal scars.   GU: No CVA tenderness.  Neurologic: Grossly intact, no focal deficits, moving all 4 extremities. Psychiatric: Normal mood and  affect.  Laboratory Data: Lab Results  Component Value Date   CREATININE 1.02 09/03/2017    Lab Results  Component Value Date   PSA1 7.5 (H) 10/02/2017    Urinalysis Lab Results  Component Value Date   SPECGRAV 1.025 10/02/2017   PHUR 5.5 10/02/2017   COLORU Yellow 10/02/2017   APPEARANCEUR Clear 10/02/2017   LEUKOCYTESUR Negative 10/02/2017   PROTEINUR Trace (A) 10/02/2017   GLUCOSEU Negative 10/02/2017   KETONESU Negative 10/02/2017   RBCU Negative 10/02/2017   BILIRUBINUR Negative 10/02/2017   UUROB 0.2 10/02/2017   NITRITE Negative 10/02/2017    Pertinent Imaging: N/a  Assessment & Plan:    1. Prostate cancer [facility]) The patient was counseled about the natural history of prostate cancer and the standard treatment options that are available for prostate cancer. It was explained to him how his age and life expectancy, clinical stage, Gleason score, and PSA affect his prognosis, the decision to proceed with additional staging studies, as well as how that information influences recommended treatment strategies. We discussed the roles for active surveillance, radiation therapy, surgical therapy, androgen deprivation, as well as ablative therapy options for the treatment of prostate cancer as appropriate to his individual cancer situation. We discussed the risks and benefits of these options with regard to their impact on cancer control and also in terms of potential adverse events, complications, and impact on quality of life particularly related to urinary, bowel, and sexual function. The patient was encouraged to ask questions throughout the discussion today and all questions were answered to his stated satisfaction. In addition, the patient was providedwith and/or directed to appropriate resources and literature for further education about prostate cancer treatment options.  We discussed surgical therapy for prostate cancer including the different available surgical approaches.  We discussed, in detail, the risks and expectations of surgery with regard to cancer control, urinary control, and erectile dysfunction as well as expected post operative cover he processed. Additional risks of surgery including but not limalited to bleeding, infection, hernia formation, nerve damage, steel formation, bowel/rect injury, potentially necessitating colostomy, damage to the urinary tract resulting in urinary leakage, urethral stricture, and cardiopulmonary risk such as myocardial infarction, stroke, death, thromboembolism etc. were explained. The risk of open surgical conversion for robotics/laparoscopic prostatectomy is also discussed.  He was offered referral to radiation oncology but declined.  He is most interested in surgery and has talked to his son extensively about this.    He is scheduled for PT for [employer].    We will schedule him for a robotic radical prostatectomy with bilateral pelvic lymph node dissection.  We will likely perform non-nerve sparing on the right given the extent of disease and partial nerve sparing on the left depending on intraoperative findings.  He understands all of this.  Discussion was lengthy today.  All questions answered.  [name], MD  [facility] [address], [place], [place] [address] [phone]  [phone]  I spent 25 min with this patient of which greater than 50% was spent in counseling and coordination of care with the patient.

## 2017-11-13 NOTE — H&P (View-Only) (Signed)
11/13/2017 8:56 AM   Jerry Pena 08-06-1951 [id number]  Referring provider: Arnetha Courser, MD [address] [place], [place] [address]  Chief Complaint  Patient presents with  . Prostate Cancer    HPI: 66 year old male who presents today for consideration of radical robotic prostatectomy.  He was initially seen and evaluated for elevated PSA of 7.5.  He underwent prostate biopsy which revealed 7 of 12 cores positive for malignancy, Gleason 3+4 on the right involving the apex and right lateral mid (up to 95% of tissue) as well as Gleason 3+3 and 3+4 on the contralateral side with lower volume disease.  TRUS volume 35 cc.  On rectal exam, nodule was noted at the right lateral lobe as well as mid gland.  SHIM 20 IPSS: 5, QOL 0  He does have a family history of prostate cancer.  Of note, his son was diagnosed with prostate cancer at age 78, Gleason 3+4 and underwent robotic laparoscopic radical prostatectomy.  No previous abdominal surgeries.  PMH: Past Medical History:  Diagnosis Date  . DDD (degenerative disc disease), lumbar 06/13/2017   Lumbar imaging June 2018  . Elevated PSA 09/12/2017   Refer to urologist, Sept 2018  . Facet hypertrophy of lumbar region 06/13/2017   Lumbar imaging June 2018  . Hyperlipidemia     Surgical History: History reviewed. No pertinent surgical history.  Home Medications:  Allergies as of 11/13/2017      Reactions   Penicillins Hives      Medication List        Accurate as of 11/13/17  8:56 AM. Always use your most recent med list.          acetaminophen 500 MG tablet Commonly known as:  TYLENOL Take 500 mg by mouth every 6 (six) hours as needed.       Allergies:  Allergies  Allergen Reactions  . Penicillins Hives    Family History: Family History  Problem Relation Age of Onset  . Cancer Sister        breast  . Cancer Son        prostate  . Prostate cancer Son   . Bladder Cancer Neg Hx   . Kidney cancer  Neg Hx     Social History:  reports that he has been smoking cigarettes.  he has never used smokeless tobacco. He reports that he drinks about 2.4 oz of alcohol per week. He reports that he does not use drugs.  ROS: UROLOGY Frequent Urination?: No Hard to postpone urination?: No Burning/pain with urination?: No Get up at night to urinate?: No Leakage of urine?: No Urine stream starts and stops?: No Trouble starting stream?: No Do you have to strain to urinate?: No Blood in urine?: No Urinary tract infection?: No Sexually transmitted disease?: No Injury to kidneys or bladder?: No Painful intercourse?: No Weak stream?: No Erection problems?: No Penile pain?: No  Gastrointestinal Nausea?: No Vomiting?: No Indigestion/heartburn?: No Diarrhea?: No Constipation?: No  Constitutional Fever: No Weight loss?: No Fatigue?: No  Skin Skin rash/lesions?: No Itching?: No  Eyes Blurred vision?: No  Ears/Nose/Throat Sore throat?: No Sinus problems?: No  Hematologic/Lymphatic Swollen glands?: No Easy bruising?: No  Cardiovascular Chest pain?: No  Respiratory Cough?: No Shortness of breath?: No     Musculoskeletal Back pain?: No Joint pain?: No  Neurological Dizziness?: No  Psychologic Depression?: No Anxiety?: No  Physical Exam: BP 111/73 (BP Location: Right Arm, Patient Position: Sitting, Cuff Size: Normal)  Pulse 92   Ht 5\' 7"  (1.702 m)   Wt 139 lb 12.8 oz (63.4 kg)   BMI 21.90 kg/m   Constitutional:  Alert and oriented, No acute distress. HEENT: [place] AT, moist mucus membranes.  Trachea midline, no masses. Cardiovascular: No clubbing, cyanosis, or edema. Respiratory: Normal respiratory effort, no increased work of breathing. GI: Abdomen is soft, nontender, nondistended, no abdominal masses. No abdominal scars.   GU: No CVA tenderness.  Neurologic: Grossly intact, no focal deficits, moving all 4 extremities. Psychiatric: Normal mood and  affect.  Laboratory Data: Lab Results  Component Value Date   CREATININE 1.02 09/03/2017    Lab Results  Component Value Date   PSA1 7.5 (H) 10/02/2017    Urinalysis Lab Results  Component Value Date   SPECGRAV 1.025 10/02/2017   PHUR 5.5 10/02/2017   COLORU Yellow 10/02/2017   APPEARANCEUR Clear 10/02/2017   LEUKOCYTESUR Negative 10/02/2017   PROTEINUR Trace (A) 10/02/2017   GLUCOSEU Negative 10/02/2017   KETONESU Negative 10/02/2017   RBCU Negative 10/02/2017   BILIRUBINUR Negative 10/02/2017   UUROB 0.2 10/02/2017   NITRITE Negative 10/02/2017    Pertinent Imaging: N/a  Assessment & Plan:    1. Prostate cancer [facility]) The patient was counseled about the natural history of prostate cancer and the standard treatment options that are available for prostate cancer. It was explained to him how his age and life expectancy, clinical stage, Gleason score, and PSA affect his prognosis, the decision to proceed with additional staging studies, as well as how that information influences recommended treatment strategies. We discussed the roles for active surveillance, radiation therapy, surgical therapy, androgen deprivation, as well as ablative therapy options for the treatment of prostate cancer as appropriate to his individual cancer situation. We discussed the risks and benefits of these options with regard to their impact on cancer control and also in terms of potential adverse events, complications, and impact on quality of life particularly related to urinary, bowel, and sexual function. The patient was encouraged to ask questions throughout the discussion today and all questions were answered to his stated satisfaction. In addition, the patient was providedwith and/or directed to appropriate resources and literature for further education about prostate cancer treatment options.  We discussed surgical therapy for prostate cancer including the different available surgical approaches.  We discussed, in detail, the risks and expectations of surgery with regard to cancer control, urinary control, and erectile dysfunction as well as expected post operative cover he processed. Additional risks of surgery including but not limalited to bleeding, infection, hernia formation, nerve damage, steel formation, bowel/rect injury, potentially necessitating colostomy, damage to the urinary tract resulting in urinary leakage, urethral stricture, and cardiopulmonary risk such as myocardial infarction, stroke, death, thromboembolism etc. were explained. The risk of open surgical conversion for robotics/laparoscopic prostatectomy is also discussed.  He was offered referral to radiation oncology but declined.  He is most interested in surgery and has talked to his son extensively about this.    He is scheduled for PT for [employer].    We will schedule him for a robotic radical prostatectomy with bilateral pelvic lymph node dissection.  We will likely perform non-nerve sparing on the right given the extent of disease and partial nerve sparing on the left depending on intraoperative findings.  He understands all of this.  Discussion was lengthy today.  All questions answered.  [name], MD  [facility] [address], [place], [place] [address] [phone]  [phone]  I spent 25 min with this patient of which greater than 50% was spent in counseling and coordination of care with the patient.

## 2017-11-14 ENCOUNTER — Other Ambulatory Visit: Payer: Self-pay | Admitting: Radiology

## 2017-11-14 DIAGNOSIS — C61 Malignant neoplasm of prostate: Secondary | ICD-10-CM

## 2017-11-23 ENCOUNTER — Ambulatory Visit: Payer: Medicare HMO | Attending: Urology | Admitting: Physical Therapy

## 2017-11-23 ENCOUNTER — Encounter
Admission: RE | Admit: 2017-11-23 | Discharge: 2017-11-23 | Disposition: A | Discharge status: Home / Self Care | Payer: Medicare HMO | Source: Ambulatory Visit | Attending: Urology | Admitting: Urology

## 2017-11-23 ENCOUNTER — Other Ambulatory Visit: Payer: Self-pay

## 2017-11-23 ENCOUNTER — Encounter: Payer: Self-pay | Admitting: Physical Therapy

## 2017-11-23 DIAGNOSIS — R29898 Other symptoms and signs involving the musculoskeletal system: Secondary | ICD-10-CM | POA: Insufficient documentation

## 2017-11-23 DIAGNOSIS — R278 Other lack of coordination: Secondary | ICD-10-CM | POA: Diagnosis not present

## 2017-11-23 DIAGNOSIS — Z01818 Encounter for other preprocedural examination: Secondary | ICD-10-CM | POA: Insufficient documentation

## 2017-11-23 DIAGNOSIS — F17200 Nicotine dependence, unspecified, uncomplicated: Secondary | ICD-10-CM | POA: Insufficient documentation

## 2017-11-23 DIAGNOSIS — Z136 Encounter for screening for cardiovascular disorders: Secondary | ICD-10-CM | POA: Diagnosis not present

## 2017-11-23 DIAGNOSIS — R001 Bradycardia, unspecified: Secondary | ICD-10-CM | POA: Insufficient documentation

## 2017-11-23 HISTORY — DX: Malignant (primary) neoplasm, unspecified: C80.1

## 2017-11-23 LAB — PROTIME-INR
INR: 0.93
PROTHROMBIN TIME: 12.4 s (ref 11.4–15.2)

## 2017-11-23 LAB — CBC
HCT: 39.2 % — ABNORMAL LOW (ref 40.0–52.0)
Hemoglobin: 13.2 g/dL (ref 13.0–18.0)
MCH: 23.4 pg — AB (ref 26.0–34.0)
MCHC: 33.5 g/dL (ref 32.0–36.0)
MCV: 69.7 fL — AB (ref 80.0–100.0)
PLATELETS: 318 10*3/uL (ref 150–440)
RBC: 5.63 MIL/uL (ref 4.40–5.90)
RDW: 16.1 % — AB (ref 11.5–14.5)
WBC: 5.4 10*3/uL (ref 3.8–10.6)

## 2017-11-23 LAB — BASIC METABOLIC PANEL
ANION GAP: 10 (ref 5–15)
BUN: 16 mg/dL (ref 6–20)
CO2: 27 mmol/L (ref 22–32)
Calcium: 9.2 mg/dL (ref 8.9–10.3)
Chloride: 102 mmol/L (ref 101–111)
Creatinine, Ser: 0.97 mg/dL (ref 0.61–1.24)
GFR calc Af Amer: >60 mL/min (ref >60)
GLUCOSE: 103 mg/dL — AB (ref 65–99)
POTASSIUM: 4.4 mmol/L (ref 3.5–5.1)
SODIUM: 139 mmol/L (ref 135–145)

## 2017-11-23 LAB — URINALYSIS, ROUTINE W REFLEX MICROSCOPIC
BILIRUBIN URINE: NEGATIVE
Glucose, UA: NEGATIVE mg/dL
Hgb urine dipstick: NEGATIVE
Ketones, ur: NEGATIVE mg/dL
LEUKOCYTES UA: NEGATIVE
NITRITE: NEGATIVE
PH: 5 (ref 5.0–8.0)
Protein, ur: NEGATIVE mg/dL
SPECIFIC GRAVITY, URINE: 1.021 (ref 1.005–1.030)

## 2017-11-23 LAB — TYPE AND SCREEN
ABO/RH(D): O POS
Antibody Screen: NEGATIVE

## 2017-11-23 NOTE — Therapy (Addendum)
[place] MAIN [facility] SERVICES [address] [place], [place], [address] Phone: [phone]   Fax:  [phone]  Physical Therapy Evaluation  Patient Details  Name: Jerry Pena MRN: [id number] Date of Birth: [birth year] Referring Provider: Elenor Quinones    Encounter Date: 11/23/2017    Past Medical History:  Diagnosis Date  . Cancer ([place]) 10/2017   Prostate  . DDD (degenerative disc disease), lumbar 06/13/2017   Lumbar imaging June 2018  . Elevated PSA 09/12/2017   Refer to urologist, Sept 2018  . Facet hypertrophy of lumbar region 06/13/2017   Lumbar imaging June 2018  . Hyperlipidemia     Past Surgical History:  Procedure Laterality Date  . CATARACT EXTRACTION W/ INTRAOCULAR LENS IMPLANT Left 3/ 28/2014   eyes  . CATARACT EXTRACTION W/ INTRAOCULAR LENS IMPLANT Right 04/23/2013    There were no vitals filed for this visit.   Subjective Assessment - 11/23/17 0807    Subjective  Pt is scheduled for prostate surgery ( radical prostatectomy) on 12/10 /18 and was referred to PT for pelvic floor training to minimize urinary lekaage post surgery. Pt denied urinary leakage currently and LBP.  Daily Fluid intake:  3 glasses of water. 8 oz of ginger ale with every meal. coffee 2 x/ week.  no tea. Bowel movements occur daily to every other day. [place] Stool Type 4. Pt eats a lot of greens and fibers. Denied straining with bowel movements.   Pt is retired. Pt enjoys lifting weights 50lb ( 25 lbs in each hand) .  Pt has been active all his life. Pt walk to his grocery store.     Pertinent History  Hx of sciatica but it has gotten " alot " better since he has been more active.  Denied abdominal/ back surgeries.          [facility] PT Assessment - 11/23/17 1201      Assessment   Medical Diagnosis  prostate cancer    Referring Provider  [place]       Precautions   Precautions  None      Restrictions   Weight Bearing Restrictions  No      Balance  Screen   Has the patient fallen in the past 6 months  No      Functional Tests   Functional tests  Other      Other:   Other/ Comments  breathholding, straining w/ weightlifting dumbbells, sit to stand       Posture/Postural Control   Posture Comments  abdominal straining with double leg lifts ( self-selected exercise)  . Slight abdominal bulging with leg lifts which he prefers to perform in his self-selected workout      Bed Mobility   Bed Mobility  -- crunch method ( abdominal / pelvic floor strain)              Objective measurements completed on examination: See above findings.    Pelvic Floor Special Questions - 11/23/17 1203    External Perineal Exam  through clothing, abdominal / pelvic floor straining with cue for bowel movement. no lift of pelvic floor with cue for contraction ( imprved contraction with cue to squeeze with exhale)        [facility] Adult PT Treatment/Exercise - 11/23/17 1201      Neuro Re-ed    Neuro Re-ed Details   see pt instructions                PT Short  Term Goals - 12-22-17 1205      PT SHORT TERM GOAL #1   Title  Pt will demo proper pelvic floor coordination and contraction in order to progress with pelvic floor strengthenign exercises post surgery    Time  1    Period  Days    Status  Achieved      PT SHORT TERM GOAL #2   Title  Pt will be IND with HEP    Time  1    Period  Days    Status  Achieved      PT SHORT TERM GOAL #3   Title  Pt will demo proper coordination of deep core mm to minimzie strain on pelvic floor and abdominal mm with his self selected exercises with weight lifting and other core exercises    Time  1    Period  Weeks    Status  Achieved                Plan - 12-22-17 1204    Clinical Impression Statement  Pt is a 66 yo male who is scheduled for prostate surgery on 12/03/17. Pt was referred to pelvic health PT to learn ways to minimize urinary incontinence post -op. Pt has clinical  presentations that place him a higher risk for urinary incontinence include: _slight abdominal bulging  _poor deep core strength and coordination _dyscoordination of pelvic floor _limited education/awareness of proper ways to weight lift and perform core exercises with less strain on his abdominal/pelvic floor mm  These are factors leading to a poor functioning  intraabdomnal pressure system which is required for continence. Following today's session, pt learned body mechanics, modifications to his self-selected exercises, demo'd proper technique to weight lift with proper co-activation of deep core mm. He voiced understanding/ demo'd ways to not strain his abdominal/  pelvic floor area. Pt also demo'd correctly pelvic floor strengthening exercises. Pt is d/c today due to achieving his STG. Pt may benefit from pelvic health PT post surgery in order to yield better outcomes with urinary continence.  .          Clinical Presentation  Stable    Clinical Decision Making  Low    Rehab Potential  Good    PT Frequency  One time visit    PT Treatment/Interventions  Neuromuscular re-education;Patient/family education;Stair training;Therapeutic activities;Therapeutic exercise;Manual techniques;Moist Heat    Consulted and Agree with Plan of Care  Patient       Patient will benefit from skilled therapeutic intervention in order to improve the following deficits and impairments:  Difficulty walking, Decreased mobility, Decreased balance, Impaired flexibility, Decreased coordination, Decreased safety awareness, Hypomobility, Decreased endurance, Decreased strength, Increased muscle spasms  Visit Diagnosis: Other lack of coordination  Other symptoms and signs involving the musculoskeletal system  G-Codes - 12/22/17 1200    Functional Assessment Tool Used (Outpatient Only)  clinical judgement     Functional Limitation  Mobility: Walking and moving around    Mobility: Walking and Moving Around Current  Status ([id number])  At least 1 percent but less than 20 percent impaired, limited or restricted    Mobility: Walking and Moving Around Goal Status [phone])  At least 1 percent but less than 20 percent impaired, limited or restricted        Problem List Patient Active Problem List   Diagnosis Date Noted  . Elevated PSA 09/12/2017  . Prostate cancer screening 06/13/2017  . Medication monitoring encounter 06/13/2017  . Facet hypertrophy of lumbar  region 06/13/2017  . DDD (degenerative disc disease), lumbar 06/13/2017  . Preventative health care 09/28/2015  . Hyperlipidemia 09/09/2015  . Sciatica 09/09/2015  . Arthritis, lumbar spine 09/09/2015  . Tobacco abuse 09/09/2015  . History of colon polyps 09/09/2015    Jerl Mina ,PT, DPT, E-RYT  11/23/2017, 12:07 PM  [place] MAIN [facility] SERVICES [address] [place], [place], [address] Phone: [phone]   Fax:  [phone]  Name: Jerry Pena MRN: [id number] Date of Birth: 05-19-1951

## 2017-11-23 NOTE — Patient Instructions (Addendum)
    Currently:  3 ( 8 fl oz ) of ginger ale   3 ( 8 fl oz) of water   1: 1 ratio    ________  Aim to increase water to bladder irritants ( ginger ale) to 2:1  3 ( 8 fl oz ) of ginger ale   6 ( 8 fl oz) of water    ________________________   Avoid straining pelvic floor, abdominal muscles , spine  Use log rolling technique instead of getting out of bed with your neck or the sit-up    Into bed: Sit --> sidelying --> back    ___ Log rolling out of .bed   L  arm overhead  Raise hips and scoot hips to R   Drop knees to L,  scooting L shoulder back to get completely on your L side so your shoulders, hips, and knees point to the L    Then breathe as you drop feet off bed and prop onto L elbow and  use both hands to push yourself    ____    ONE WEEK PRIOR TO SURGERY:  1) Pelvic floor squeezes in sitting/ laying down   Inhale, exhale, quick squeeze then inhale to relax pelvic floor  10 reps X 5 x day     2) deep core level 2 ( knee out exercise)    3) lifting weights:  practice coordination:   exhale when you lift weight to engage pelvic floor correctly    ________________   WEEK AFTER SURGERY WHEN CATHETER IS IN:  Do not do any pelvic floor squeezes until catheter is removed   You can do  1)  Deep core level 2 exercise  6 min in the morning and night  2) walking 15 min for 2x day   WEEK AFTER CATHETER IS REMOVED  1) Pelvic floor squeezes in sitting/ laying down   Inhale, exhale, quick squeeze then inhale to relax pelvic floor  10 reps X 5 x day     2) deep core level 2 ( knee out exercise)    3) Hold off on lifting weights   _______________   AFTER YOUR DOCTOR HAS CLEARED TO BE ABLE TO LIFT AGAIN:   Build up your weights gradually from 3 lbs for 2-3 weeks , then progress to 5 lbs for 2-4 weeks, after 16  weeks then resume back to higher weights

## 2017-11-23 NOTE — Patient Instructions (Signed)
  Your procedure is scheduled ZO:[id number] Dec. 10th , 2018. Report to Same Day Surgery. To find out your arrival time please call [phone] between 1PM - 3PM on Friday Dec. 7th, 2018 .  Remember: Instructions that are not followed completely may result in serious medical risk, up to and including death, or upon the discretion of your surgeon and anesthesiologist your surgery may need to be rescheduled.    _x___ 1. Do not eat food after midnight night prior to surgery. No gum   chewing or hard candies, snacks or breakfast.    2023-06-12 drink the following: water, Gatorade, clear apple juice, black coffee     or black tea up until 2 hours prior to ARRIVAL time.     _x___ 2. No Alcohol for 24 hours before or after surgery.   ____ 3. Bring all medications with you on the day of surgery if instructed.    __x__ 4. Notify your doctor if there is any change in your medical condition     (cold, fever, infections).    __x___ 5.   Do Not Smoke or use e-cigarettes For 24 Hours Prior to Your   Surgery.  Do not use any chewable tobacco products for at least 6   hours prior to  surgery.                      Do not wear jewelry, make-up, hairpins, clips or nail polish.  Do not wear lotions, powders, or perfumes.   Do not shave 48 hours prior to surgery. Men may shave face and neck.  Do not bring valuables to the hospital.    [facility] is not responsible for any belongings or valuables.               Contacts, dentures or bridgework may not be worn into surgery.  Leave your suitcase in the car. After surgery it may be brought to your room.  For patients admitted to the hospital, discharge time is determined by your  treatment team.   Patients discharged the day of surgery will not be allowed to drive home.    Please read over the following fact sheets that you were given:   [facility] Preparing for Surgery  ____ Take these medicines the morning of surgery with A SIP OF WATER:  NONE        ____ Fleet Enema (as directed)   _x___ Use CHG Soap as directed on instruction sheet  ____ Use inhalers on the day of surgery and bring to hospital day of surgery  ____ Stop metformin 2 days prior to surgery    ____ Take 1/2 of usual insulin dose the night before surgery and none on the morning of          surgery.   ____ Stop Eliquis/Coumadin/Plavix/aspirin on does not apply.  _x___ Stop Anti-inflammatories such as Advil, Aleve, Ibuprofen, Motrin, Naproxen,  Naprosyn, Goodies powders or aspirin products. OK to take Tylenol.   ____ Stop supplements until after surgery.    ____ Bring C-Pap to the hospital.

## 2017-11-24 LAB — URINE CULTURE: CULTURE: NO GROWTH

## 2017-11-26 NOTE — Addendum Note (Signed)
Addended by: Jerl Mina on: 11/26/2017 08:28 AM   Modules accepted: Orders

## 2017-12-05 ENCOUNTER — Other Ambulatory Visit: Payer: Self-pay | Admitting: Radiology

## 2017-12-09 MED ORDER — CLINDAMYCIN PHOSPHATE 900 MG/50ML IV SOLN
900.0000 mg | INTRAVENOUS | Status: AC
  Administered 2017-12-10: 900 mg via INTRAVENOUS

## 2017-12-10 ENCOUNTER — Encounter
Admission: RE | Disposition: A | Discharge status: Home / Self Care | Payer: Self-pay | Source: Ambulatory Visit | Attending: Urology

## 2017-12-10 ENCOUNTER — Observation Stay
Admission: RE | Admit: 2017-12-10 | Discharge: 2017-12-11 | Disposition: A | Discharge status: Home / Self Care | Payer: Medicare HMO | Source: Ambulatory Visit | Attending: Urology | Admitting: Urology

## 2017-12-10 ENCOUNTER — Encounter: Payer: Self-pay | Admitting: *Deleted

## 2017-12-10 ENCOUNTER — Other Ambulatory Visit: Payer: Self-pay

## 2017-12-10 ENCOUNTER — Ambulatory Visit: Payer: Medicare HMO | Admitting: Certified Registered"

## 2017-12-10 DIAGNOSIS — J449 Chronic obstructive pulmonary disease, unspecified: Secondary | ICD-10-CM | POA: Diagnosis not present

## 2017-12-10 DIAGNOSIS — Z8042 Family history of malignant neoplasm of prostate: Secondary | ICD-10-CM | POA: Diagnosis not present

## 2017-12-10 DIAGNOSIS — C61 Malignant neoplasm of prostate: Secondary | ICD-10-CM | POA: Diagnosis not present

## 2017-12-10 DIAGNOSIS — C775 Secondary and unspecified malignant neoplasm of intrapelvic lymph nodes: Secondary | ICD-10-CM | POA: Diagnosis not present

## 2017-12-10 DIAGNOSIS — F17210 Nicotine dependence, cigarettes, uncomplicated: Secondary | ICD-10-CM | POA: Diagnosis not present

## 2017-12-10 HISTORY — PX: ROBOT ASSISTED LAPAROSCOPIC RADICAL PROSTATECTOMY: SHX5141

## 2017-12-10 HISTORY — PX: PELVIC LYMPH NODE DISSECTION: SHX6543

## 2017-12-10 LAB — TYPE AND SCREEN
ABO/RH(D): O POS
ANTIBODY SCREEN: NEGATIVE

## 2017-12-10 SURGERY — ROBOTIC ASSISTED LAPAROSCOPIC RADICAL PROSTATECTOMY
Anesthesia: General | Site: Abdomen | Wound class: Clean Contaminated

## 2017-12-10 MED ORDER — LACTATED RINGERS IV SOLN
INTRAVENOUS | Status: DC | PRN
  Administered 2017-12-10: 10:00:00 via INTRAVENOUS

## 2017-12-10 MED ORDER — LIDOCAINE HCL (PF) 2 % IJ SOLN
INTRAMUSCULAR | Status: AC
Start: 2017-12-10 — End: 2017-12-10
  Filled 2017-12-10: qty 10

## 2017-12-10 MED ORDER — EPHEDRINE SULFATE 50 MG/ML IJ SOLN
INTRAMUSCULAR | Status: AC
  Filled 2017-12-10: qty 1

## 2017-12-10 MED ORDER — ROCURONIUM BROMIDE 50 MG/5ML IV SOLN
INTRAVENOUS | Status: AC
  Filled 2017-12-10: qty 1

## 2017-12-10 MED ORDER — PHENYLEPHRINE HCL 10 MG/ML IJ SOLN
INTRAMUSCULAR | Status: DC | PRN
  Administered 2017-12-10: 100 ug via INTRAVENOUS
  Administered 2017-12-10 (×2): 50 ug via INTRAVENOUS

## 2017-12-10 MED ORDER — MIDAZOLAM HCL 2 MG/2ML IJ SOLN
INTRAMUSCULAR | Status: AC
  Filled 2017-12-10: qty 2

## 2017-12-10 MED ORDER — BUPIVACAINE HCL (PF) 0.5 % IJ SOLN
INTRAMUSCULAR | Status: AC
  Filled 2017-12-10: qty 30

## 2017-12-10 MED ORDER — SUGAMMADEX SODIUM 200 MG/2ML IV SOLN
INTRAVENOUS | Status: DC | PRN
  Administered 2017-12-10: 125 mg via INTRAVENOUS

## 2017-12-10 MED ORDER — SUCCINYLCHOLINE CHLORIDE 20 MG/ML IJ SOLN
INTRAMUSCULAR | Status: DC | PRN
  Administered 2017-12-10: 70 mg via INTRAVENOUS

## 2017-12-10 MED ORDER — FENTANYL CITRATE (PF) 100 MCG/2ML IJ SOLN
INTRAMUSCULAR | Status: AC
  Filled 2017-12-10: qty 2

## 2017-12-10 MED ORDER — FENTANYL CITRATE (PF) 250 MCG/5ML IJ SOLN
INTRAMUSCULAR | Status: AC
  Filled 2017-12-10: qty 5

## 2017-12-10 MED ORDER — HEPARIN SODIUM (PORCINE) 5000 UNIT/ML IJ SOLN
5000.0000 [IU] | Freq: Three times a day (TID) | INTRAMUSCULAR | Status: DC
  Administered 2017-12-10 – 2017-12-11 (×2): 5000 [IU] via SUBCUTANEOUS
  Filled 2017-12-10 (×2): qty 1

## 2017-12-10 MED ORDER — THROMBIN (RECOMBINANT) 5000 UNITS EX SOLR
CUTANEOUS | Status: DC | PRN
  Administered 2017-12-10: 5000 [IU] via TOPICAL

## 2017-12-10 MED ORDER — LIDOCAINE HCL (CARDIAC) 20 MG/ML IV SOLN
INTRAVENOUS | Status: DC | PRN
  Administered 2017-12-10: 60 mg via INTRAVENOUS

## 2017-12-10 MED ORDER — FAMOTIDINE 20 MG PO TABS
ORAL_TABLET | ORAL | Status: AC
  Filled 2017-12-10: qty 1

## 2017-12-10 MED ORDER — ONDANSETRON HCL 4 MG/2ML IJ SOLN: 4.0000 mg | Freq: Once | INTRAMUSCULAR | Status: DC | PRN

## 2017-12-10 MED ORDER — CLINDAMYCIN PHOSPHATE 900 MG/50ML IV SOLN
INTRAVENOUS | Status: AC
  Filled 2017-12-10: qty 50

## 2017-12-10 MED ORDER — OXYBUTYNIN CHLORIDE 5 MG PO TABS: 5.0000 mg | ORAL_TABLET | Freq: Three times a day (TID) | ORAL | Status: DC | PRN

## 2017-12-10 MED ORDER — SUGAMMADEX SODIUM 200 MG/2ML IV SOLN
INTRAVENOUS | Status: AC
  Filled 2017-12-10: qty 2

## 2017-12-10 MED ORDER — MIDAZOLAM HCL 2 MG/2ML IJ SOLN
INTRAMUSCULAR | Status: DC | PRN
  Administered 2017-12-10: 2 mg via INTRAVENOUS

## 2017-12-10 MED ORDER — THROMBIN (RECOMBINANT) 5000 UNITS EX SOLR
CUTANEOUS | Status: AC
  Filled 2017-12-10: qty 5000

## 2017-12-10 MED ORDER — SODIUM CHLORIDE 0.9 % IJ SOLN
INTRAMUSCULAR | Status: AC
  Filled 2017-12-10: qty 10

## 2017-12-10 MED ORDER — PROPOFOL 10 MG/ML IV BOLUS
INTRAVENOUS | Status: DC | PRN
  Administered 2017-12-10: 110 mg via INTRAVENOUS

## 2017-12-10 MED ORDER — ROCURONIUM BROMIDE 100 MG/10ML IV SOLN
INTRAVENOUS | Status: DC | PRN
  Administered 2017-12-10 (×2): 20 mg via INTRAVENOUS
  Administered 2017-12-10: 45 mg via INTRAVENOUS
  Administered 2017-12-10: 10 mg via INTRAVENOUS
  Administered 2017-12-10: 5 mg via INTRAVENOUS
  Administered 2017-12-10 (×2): 20 mg via INTRAVENOUS

## 2017-12-10 MED ORDER — MORPHINE SULFATE (PF) 2 MG/ML IV SOLN
2.0000 mg | INTRAVENOUS | Status: DC | PRN
  Administered 2017-12-10: 4 mg via INTRAVENOUS
  Administered 2017-12-10 (×2): 2 mg via INTRAVENOUS
  Administered 2017-12-11 (×4): 4 mg via INTRAVENOUS
  Filled 2017-12-10 (×2): qty 2
  Filled 2017-12-10 (×2): qty 1
  Filled 2017-12-10 (×3): qty 2

## 2017-12-10 MED ORDER — ACETAMINOPHEN 325 MG PO TABS: 650.0000 mg | ORAL_TABLET | ORAL | Status: DC | PRN

## 2017-12-10 MED ORDER — OXYCODONE HCL 5 MG PO TABS
5.0000 mg | ORAL_TABLET | ORAL | Status: DC | PRN
  Administered 2017-12-10: 5 mg via ORAL
  Filled 2017-12-10: qty 1

## 2017-12-10 MED ORDER — ONDANSETRON HCL 4 MG/2ML IJ SOLN
INTRAMUSCULAR | Status: DC | PRN
  Administered 2017-12-10: 4 mg via INTRAVENOUS

## 2017-12-10 MED ORDER — FENTANYL CITRATE (PF) 100 MCG/2ML IJ SOLN: 25.0000 ug | INTRAMUSCULAR | Status: DC | PRN

## 2017-12-10 MED ORDER — ZOLPIDEM TARTRATE 5 MG PO TABS: 5.0000 mg | ORAL_TABLET | Freq: Every evening | ORAL | Status: DC | PRN

## 2017-12-10 MED ORDER — PROPOFOL 10 MG/ML IV BOLUS
INTRAVENOUS | Status: AC
  Filled 2017-12-10: qty 20

## 2017-12-10 MED ORDER — ACETAMINOPHEN 10 MG/ML IV SOLN
INTRAVENOUS | Status: AC
  Filled 2017-12-10: qty 100

## 2017-12-10 MED ORDER — ONDANSETRON HCL 4 MG/2ML IJ SOLN: 4.0000 mg | INTRAMUSCULAR | Status: DC | PRN

## 2017-12-10 MED ORDER — LACTATED RINGERS IV SOLN
INTRAVENOUS | Status: DC
  Administered 2017-12-10: 09:00:00 via INTRAVENOUS

## 2017-12-10 MED ORDER — DIPHENHYDRAMINE HCL 12.5 MG/5ML PO ELIX
12.5000 mg | ORAL_SOLUTION | Freq: Four times a day (QID) | ORAL | Status: DC | PRN
  Filled 2017-12-10: qty 5

## 2017-12-10 MED ORDER — SUCCINYLCHOLINE CHLORIDE 20 MG/ML IJ SOLN
INTRAMUSCULAR | Status: AC
  Filled 2017-12-10: qty 1

## 2017-12-10 MED ORDER — SODIUM CHLORIDE 0.9 % IV SOLN
INTRAVENOUS | Status: DC
  Administered 2017-12-10 – 2017-12-11 (×2): via INTRAVENOUS

## 2017-12-10 MED ORDER — FENTANYL CITRATE (PF) 100 MCG/2ML IJ SOLN
INTRAMUSCULAR | Status: DC | PRN
  Administered 2017-12-10 (×2): 50 ug via INTRAVENOUS
  Administered 2017-12-10: 100 ug via INTRAVENOUS
  Administered 2017-12-10 (×2): 50 ug via INTRAVENOUS

## 2017-12-10 MED ORDER — DOCUSATE SODIUM 100 MG PO CAPS
100.0000 mg | ORAL_CAPSULE | Freq: Two times a day (BID) | ORAL | Status: DC
  Administered 2017-12-10 – 2017-12-11 (×2): 100 mg via ORAL
  Filled 2017-12-10 (×2): qty 1

## 2017-12-10 MED ORDER — DIPHENHYDRAMINE HCL 50 MG/ML IJ SOLN: 12.5000 mg | Freq: Four times a day (QID) | INTRAMUSCULAR | Status: DC | PRN

## 2017-12-10 MED ORDER — ACETAMINOPHEN 10 MG/ML IV SOLN
INTRAVENOUS | Status: DC | PRN
  Administered 2017-12-10: 1000 mg via INTRAVENOUS

## 2017-12-10 MED ORDER — ONDANSETRON HCL 4 MG/2ML IJ SOLN
INTRAMUSCULAR | Status: AC
  Filled 2017-12-10: qty 2

## 2017-12-10 MED ORDER — FAMOTIDINE 20 MG PO TABS
20.0000 mg | ORAL_TABLET | Freq: Once | ORAL | Status: AC
  Administered 2017-12-10: 20 mg via ORAL

## 2017-12-10 MED ORDER — BUPIVACAINE HCL 0.5 % IJ SOLN
INTRAMUSCULAR | Status: DC | PRN
  Administered 2017-12-10: 36 mL

## 2017-12-10 SURGICAL SUPPLY — 100 items
ANCHOR TIS RET SYS 235ML (MISCELLANEOUS) IMPLANT
APPLICATOR SURGIFLO ENDO (HEMOSTASIS) ×3 IMPLANT
APPLIER CLIP LOGIC TI 5 (MISCELLANEOUS) ×3 IMPLANT
BAG URINE DRAINAGE (UROLOGICAL SUPPLIES) ×3 IMPLANT
BLADE CLIPPER SURG (BLADE) ×3 IMPLANT
BULB RESERV EVAC DRAIN JP 100C (MISCELLANEOUS) IMPLANT
CANISTER SUCT 1200ML W/VALVE (MISCELLANEOUS) ×3 IMPLANT
CATH DRAINAGE MALECOT 26FR (CATHETERS) ×1 IMPLANT
CATH FOL 2WAY LX 18X5 (CATHETERS) ×6 IMPLANT
CATH MALECOT (CATHETERS) ×3
CHLORAPREP W/TINT 26ML (MISCELLANEOUS) ×6 IMPLANT
CLIP SUT LAPRA TY ABSORB (SUTURE) ×6 IMPLANT
CLIP VESOLOCK LG 6/CT PURPLE (CLIP) ×9 IMPLANT
CORD BIP STRL DISP 12FT (MISCELLANEOUS) ×3 IMPLANT
CORD MONOPOLAR M/FML 12FT (MISCELLANEOUS) ×3 IMPLANT
COVER TIP SHEARS 8 DVNC (MISCELLANEOUS) ×1 IMPLANT
COVER TIP SHEARS 8MM DA VINCI (MISCELLANEOUS) ×2
CUTTER ECHEON FLEX ENDO 45 340 (ENDOMECHANICALS) ×3 IMPLANT
DEFOGGER SCOPE WARMER CLEARIFY (MISCELLANEOUS) ×3 IMPLANT
DERMABOND ADVANCED (GAUZE/BANDAGES/DRESSINGS) ×2
DERMABOND ADVANCED .7 DNX12 (GAUZE/BANDAGES/DRESSINGS) ×1 IMPLANT
DRAIN CHANNEL JP 15F RND 16 (MISCELLANEOUS) IMPLANT
DRAIN CHANNEL JP 19F (MISCELLANEOUS) IMPLANT
DRAPE LEGGINS SURG 28X43 STRL (DRAPES) ×3 IMPLANT
DRAPE SHEET LG 3/4 BI-LAMINATE (DRAPES) ×3 IMPLANT
DRAPE SURG 17X11 SM STRL (DRAPES) ×12 IMPLANT
DRAPE TABLE BACK 80X90 (DRAPES) ×3 IMPLANT
DRAPE UNDER BUTTOCK W/FLU (DRAPES) ×3 IMPLANT
DRIVER LRG NEEDLE DA VINCI (INSTRUMENTS) ×6
DRIVER NDLE LRG DVNC (INSTRUMENTS) ×3 IMPLANT
DRSG TELFA 3X8 NADH (GAUZE/BANDAGES/DRESSINGS) ×3 IMPLANT
ELECT REM PT RETURN 9FT ADLT (ELECTROSURGICAL) ×3
ELECTRODE REM PT RTRN 9FT ADLT (ELECTROSURGICAL) ×1 IMPLANT
FILTER LAP SMOKE EVAC STRL (MISCELLANEOUS) ×3 IMPLANT
FORCEPS MARYLAND BIPOLAR 8X55 (INSTRUMENTS) ×2
FORCEPS MRYLND BPLR 8X55 DVNC (INSTRUMENTS) ×1 IMPLANT
GLOVE BIO SURGEON STRL SZ 6.5 (GLOVE) ×12 IMPLANT
GLOVE BIO SURGEONS STRL SZ 6.5 (GLOVE) ×6
GOWN STRL REUS W/ TWL LRG LVL3 (GOWN DISPOSABLE) ×4 IMPLANT
GOWN STRL REUS W/TWL LRG LVL3 (GOWN DISPOSABLE) ×8
GRASPER SUT TROCAR 14GX15 (MISCELLANEOUS) ×3 IMPLANT
HEMOSTAT SURGICEL 2X14 (HEMOSTASIS) IMPLANT
HOLDER FOLEY CATH W/STRAP (MISCELLANEOUS) ×3 IMPLANT
IRRIGATION STRYKERFLOW (MISCELLANEOUS) ×1 IMPLANT
IRRIGATOR STRYKERFLOW (MISCELLANEOUS) ×3
IV LACTATED RINGERS 1000ML (IV SOLUTION) ×3 IMPLANT
JELLY LUB 2OZ STRL (MISCELLANEOUS) ×2
JELLY LUBE 2OZ STRL (MISCELLANEOUS) ×1 IMPLANT
KIT ACCESSORY DA VINCI DISP (KITS) ×2
KIT ACCESSORY DVNC DISP (KITS) ×1 IMPLANT
KIT PINK PAD W/HEAD ARE REST (MISCELLANEOUS) ×3
KIT PINK PAD W/HEAD ARM REST (MISCELLANEOUS) ×1 IMPLANT
LABEL OR SOLS (LABEL) ×3 IMPLANT
MARKER SKIN DUAL TIP RULER LAB (MISCELLANEOUS) ×6 IMPLANT
NEEDLE HYPO 22GX1.5 SAFETY (NEEDLE) ×3 IMPLANT
NEEDLE INSUFFLATION 14GA 120MM (NEEDLE) ×3 IMPLANT
NS IRRIG 500ML POUR BTL (IV SOLUTION) ×3 IMPLANT
PACK LAP CHOLECYSTECTOMY (MISCELLANEOUS) ×3 IMPLANT
PENCIL ELECTRO HAND CTR (MISCELLANEOUS) ×3 IMPLANT
PROGRASP ENDOWRIST DA VINCI (INSTRUMENTS) ×2
PROGRASP ENDOWRIST DVNC (INSTRUMENTS) ×1 IMPLANT
RELOAD STAPLER WHITE 60MM (STAPLE) IMPLANT
RELOAD WH ECHELON 45 (STAPLE) ×3 IMPLANT
SCISSORS METZENBAUM CVD 33 (INSTRUMENTS) IMPLANT
SLEEVE ENDOPATH XCEL 5M (ENDOMECHANICALS) ×6 IMPLANT
SOLUTION ELECTROLUBE (MISCELLANEOUS) ×3 IMPLANT
SPOGE SURGIFLO 8M (HEMOSTASIS) ×2
SPONGE LAP 4X18 5PK (MISCELLANEOUS) ×3 IMPLANT
SPONGE SURGIFLO 8M (HEMOSTASIS) ×1 IMPLANT
SPONGE VERSALON 4X4 4PLY (MISCELLANEOUS) IMPLANT
STAPLE ECHEON FLEX 60 POW ENDO (STAPLE) IMPLANT
STAPLER RELOAD WHITE 60MM (STAPLE)
STAPLER SKIN PROX 35W (STAPLE) ×3 IMPLANT
STRAP SAFETY BODY (MISCELLANEOUS) ×6 IMPLANT
SUT DVC VLOC 90 3-0 CV23 UNDY (SUTURE) ×3 IMPLANT
SUT DVC VLOC 90 3-0 CV23 VLT (SUTURE) ×6
SUT ETHILON 3-0 FS-10 30 BLK (SUTURE)
SUT MNCRL 4-0 (SUTURE) ×4
SUT MNCRL 4-0 27XMFL (SUTURE) ×2
SUT PROLENE 5 0 RB 1 DA (SUTURE) IMPLANT
SUT SILK 2 0 SH (SUTURE) ×3 IMPLANT
SUT VIC AB 0 CT1 36 (SUTURE) ×6 IMPLANT
SUT VIC AB 2-0 CT1 (SUTURE) ×6 IMPLANT
SUT VIC AB 2-0 SH 27 (SUTURE) ×4
SUT VIC AB 2-0 SH 27XBRD (SUTURE) ×2 IMPLANT
SUT VICRYL 0 AB UR-6 (SUTURE) ×3 IMPLANT
SUTURE DVC VLC 90 3-0 CV23 VLT (SUTURE) ×2 IMPLANT
SUTURE EHLN 3-0 FS-10 30 BLK (SUTURE) IMPLANT
SUTURE MNCRL 4-0 27XMF (SUTURE) ×2 IMPLANT
SYR 10ML LL (SYRINGE) ×3 IMPLANT
SYR BULB IRRIG 60ML STRL (SYRINGE) IMPLANT
SYRINGE IRR TOOMEY STRL 70CC (SYRINGE) ×6 IMPLANT
TAPE CLOTH 10X20 WHT NS LF (TAPE) ×1 IMPLANT
TAPE CLOTH 2X10 WHT NS LF (TAPE) ×2
TROCAR DISP BLADELESS 8 DVNC (TROCAR) ×1 IMPLANT
TROCAR DISP BLADELESS 8MM (TROCAR) ×2
TROCAR ENDOPATH XCEL 12X100 BL (ENDOMECHANICALS) ×6 IMPLANT
TROCAR XCEL 12X100 BLDLESS (ENDOMECHANICALS) ×3 IMPLANT
TROCAR XCEL NON-BLD 5MMX100MML (ENDOMECHANICALS) ×3 IMPLANT
TUBING INSUFFLATION (TUBING) ×3 IMPLANT

## 2017-12-10 NOTE — Anesthesia Preprocedure Evaluation (Signed)
Anesthesia Evaluation  Patient identified by MRN, date of birth, ID band Patient awake    Reviewed: Allergy & Precautions, NPO status , Patient's Chart, lab work & pertinent test results  Airway Mallampati: II       Dental  (+) Teeth Intact   Pulmonary COPD, Current Smoker,     + decreased breath sounds      Cardiovascular Exercise Tolerance: Good  Rhythm:Regular Rate:Normal     Neuro/Psych negative psych ROS   GI/Hepatic negative GI ROS, Neg liver ROS,   Endo/Other  negative endocrine ROS  Renal/GU negative Renal ROS  negative genitourinary   Musculoskeletal   Abdominal Normal abdominal exam  (+)   Peds negative pediatric ROS (+)  Hematology negative hematology ROS (+)   Anesthesia Other Findings   Reproductive/Obstetrics                             Anesthesia Physical Anesthesia Plan  ASA: II  Anesthesia Plan: General   Post-op Pain Management:    Induction: Intravenous  PONV Risk Score and Plan:   Airway Management Planned: Oral ETT  Additional Equipment:   Intra-op Plan:   Post-operative Plan: Extubation in OR  Informed Consent: I have reviewed the patients History and Physical, chart, labs and discussed the procedure including the risks, benefits and alternatives for the proposed anesthesia with the patient or authorized representative who has indicated his/her understanding and acceptance.     Plan Discussed with: CRNA  Anesthesia Plan Comments:         Anesthesia Quick Evaluation

## 2017-12-10 NOTE — Interval H&P Note (Signed)
History and Physical Interval Note:  12/10/2017 8:36 AM  Jerry Pena  has presented today for surgery, with the diagnosis of Prostate cancer  The various methods of treatment have been discussed with the patient and family. After consideration of risks, benefits and other options for treatment, the patient has consented to  Procedure(s): ROBOTIC ASSISTED LAPAROSCOPIC RADICAL PROSTATECTOMY (N/A) PELVIC LYMPH NODE DISSECTION (N/A) as a surgical intervention .  The patient's history has been reviewed, patient examined, no change in status, stable for surgery.  I have reviewed the patient's chart and labs.  Questions were answered to the patient's satisfaction.    RRR CTAB  [name]

## 2017-12-10 NOTE — Transfer of Care (Signed)
Immediate Anesthesia Transfer of Care Note  Patient: Jerry Pena  Procedure(s) Performed: ROBOTIC ASSISTED LAPAROSCOPIC RADICAL PROSTATECTOMY (N/A Abdomen) PELVIC LYMPH NODE DISSECTION (N/A Abdomen)  Patient Location: PACU  Anesthesia Type:General  Level of Consciousness: sedated and responds to stimulation  Airway & Oxygen Therapy: Patient Spontanous Breathing and Patient connected to face mask oxygen  Post-op Assessment: Report given to RN and Post -op Vital signs reviewed and stable  Post vital signs: Reviewed and stable  Last Vitals:  Vitals:   12/10/17 1354 12/10/17 1357  BP: (!) 129/94 (!) 129/94  Pulse: 81 96  Resp: 16 17  Temp: 37.4 C   SpO2: 100% 100%    Last Pain:  Vitals:   12/10/17 0814  TempSrc: Temporal         Complications: No apparent anesthesia complications

## 2017-12-10 NOTE — Anesthesia Procedure Notes (Signed)
Procedure Name: Intubation Performed by: Lance Muss, CRNA Pre-anesthesia Checklist: Patient identified, Patient being monitored, Timeout performed, Emergency Drugs available and Suction available Patient Re-evaluated:Patient Re-evaluated prior to induction Oxygen Delivery Method: Circle system utilized Preoxygenation: Pre-oxygenation with 100% oxygen Induction Type: IV induction Ventilation: Mask ventilation without difficulty Laryngoscope Size: Mac and 3 Grade View: Grade IV Tube type: Oral Tube size: 7.5 mm Number of attempts: 2 Airway Equipment and Method: Stylet and Video-laryngoscopy Placement Confirmation: ETT inserted through vocal cords under direct vision,  positive ETCO2 and breath sounds checked- equal and bilateral Secured at: 23 cm Tube secured with: Tape Dental Injury: Teeth and Oropharynx as per pre-operative assessment  Difficulty Due To: Difficult Airway- due to anterior larynx and Difficult Airway- due to immobile epiglottis Future Recommendations: Recommend- induction with short-acting agent, and alternative techniques readily available Comments: First attempt with MAC 3, grade 4 view. Second attempt with Mcgraph 3, grade 2 view, able to pass ETT through cords with cricoid pressure. +EtCO2, +BBS.

## 2017-12-10 NOTE — Progress Notes (Signed)
IS attempted, pt sleepy not able to perform, requested to come back tomorrow

## 2017-12-10 NOTE — Anesthesia Post-op Follow-up Note (Signed)
Anesthesia QCDR form completed.        

## 2017-12-10 NOTE — Op Note (Signed)
12/10/17  PREOPERATIVE DIAGNOSIS: Prostate cancer.  POSTOPERATIVE DIAGNOSIS: Prostate cancer.  OPERATION PERFORMED: 1. DaVinci laparoscopic radical prostatectomy (right non-nerve sparing, partial nerve sparing on left) 2 DaVinci laproscopic bilateral pelvic lymph node dissection.  SURGEON: [name], MD  ASSISTANTS: Sharma Covert, PA  ANESTHESIA: General.  EBL: 50 cc  SPECIMEN: Prostate with bilateral seminal vesicals, bilateral pelvic lymph nodes, anterior fat pad.  INDICATION: Pt.is a 66 year old male with Gleason 3+4 prostate cancer. Treatment options were discussed with him at length and he chose DaVinci radical prostatectomy.  Excellent baseline erections therefore plan for at partial nerve sparing was discussed on the left only. Bilateral pelvic lymph node dissection was planned due to his risk stratification.  PROCEDURE IN DETAIL: Patient was given Ancef preoperatively. He had sequential compression devices applied preoperatively for DVT prophylaxis. He was taken to the operating room where he was induced with general anesthesia. After adequate anesthesia, he was placed in the dorsal lithotomy position. His arms were draped by his side and was appropriately padded and secured to the operating room table. He was placed in the Trendelenburg position.  He was prepped and draped in sterile fashion. An 46 French Foley was placed in the bladder and instilled with 15 cc sterile water. Orogastric tube was placed. The Veress needle was passed just above the umbilicus and the abdomen was insufflated to 15 atmospheres. A 12 mm, blunt-tip trocar was placed just above the umbilicus. The zero-degree camera was passed within this and the following trocars were placed under direct vision; 8 mm robotic trocars were placed 9 cm laterally and inferiorly to the initially placed umbilical trocar. A third one was placed 7 cm lateral to the left-sided trocar. In the  corresponding position on the right side, a 12 mm trocar was placed, and then a 5 mm trocar was placed to the right and well above the umbilicus.  The robot was then docked with the robot trocar. I used the zero-degree camera. I had the hot scissors in the right hand and the left hand with the [place] bipolar and far left hand the Prograsp forceps. Initially I divided the median umbilical ligament bilaterally and the urachus and developed the space of Retzius down to pubic bone. I divided the parietal peritoneum laterally up to the vas deferens on each side. I used the Cardier forceps to provide cranial traction on the urachus. I cleaned off the Endopelvic fascia on each side and then divided it with the scissors laterally to the perirectal fat and medially to the puboprostatic ligaments which were divided. I then ligated the dorsal vein complex using a 45 mm vascular load stapler.   I then addressed the bladder neck with a 30-degree down lens. I identified the bladder neck by pulling on the Foley catheter. I divided the anterior bladder neck musculature until I then found the anterior bladder neck mucosa which was incised. I identified the Foley catheter within, deflated the balloon, pulled the Foley out through this opening and then using the Carter-Thomason needle with a #0-Vicryl suture, passed through The suprapubic region and pulled the suture through the eye of the Foley and then back out. This allowed me to provide upward traction on the prostate. I then divided the lateral bladder neck mucosa and the posterior bladder neck mucosa. I was well away from ureteral orifices. I divided the posterior bladder neck musculature until I identified the vas deferens. They were freed proximally, then divided. I freed up the seminal vesicals using blunt and sharp  dissection. Extremely judicious use of electrocautery was used near the seminal vesicle tips to avoid injury to neurovascular  bundle.   I then went back to the 0-degree lens. I divided the Denonvilliers fascia beneath the prostate and developed the prostate off the rectum. I then did a left partial nerve sparing by  creating a plane which was intrafascial and a wider non-nerve sparing approach on the right. I then isolated the pedicles of the prostate and placed weck clips on the pedicles of the prostate and then divided it with cold scissors. I continued to divide the neurovascular bundles off the prostate out to the apex of the prostate. At this point the prostate was freed up except for the urethra. I addressed the prostate anteriorly, divided the dorsal vein , then the anterior urethral wall, pulled the Foley catheter back and then divided the posterior urethral wall. Specimen was completely freed up. I placed the prostate in an Endo catch bag and then placed the bag in the upper abdomen out of the way. I then irrigated the pelvis. The rectal test was negative. There was reasonable hemostasis.  I then did the pelvic lymph node dissection by incising the fascia overlying the right external iliac vein, dissecting distally. I went just distal to the node of [place] where we placed clips and then divided the lymphatics. The lateral aspect of the dissection was the pelvic side wall, inferior was the obturator nerve and proximal the hypogastric vessels. I placed clips at the proximal aspect and then divided the lymphatics. This was removed with the spoon grasper and sent to pathology.   I then did the left obturator lymph node dissection in the same fashion as the left side.  With good hemostasis, I then did the posterior reconstruction. I used a 3-0 VLoc suture on an RB1 through the cut edge of Denonvilliers fascia beneath the bladder on the right side and through the posterior striated sphincter underneath the urethra. This brought the bladder neck and urethra and closer proximity to help facilitate  anastomosis.   I then did the urethral vesicle anastomosis again with two 3-0 VLoc sutures on an RB1 needle interlocked. I passed both ends of the suture from the outside-in through the bladder neck at the 6 o'clock position. I passed both through the urethral stump from the inside-out in the corresponding position. I reapproximated the bladder neck to the urethra. I then ran the Left suture on the left side anastomosis to the 9 o'clock position. Then I went back to the right sided suture and ran that up the right side to the 12 o'clock position. I then continued the left suture to the 12 o'clock position.The suture was then suspended anteriorly behind the pubic bone.   I then placed a new 72 French Foley into the bladder and filled it with 10 cc sterile water. I irrigated the bladder with 120 cc. There was no leakage. There was reasonable hemostasis.  Surgiflow was used on either side of the pedicles for an additional hemostasis.  The instruments were then removed. The robot was undocked and all the trocars were removed under direct vision. There was good hemostasis. I then enlarged the umbilical trocar site large enough to remove the prostate and I closed the fascia here with #0-0 Vicryl suture in a running fashion. A 0-0 was used at the lateral right port site to close this fascia as well.  All the port sites were irrigated. Lidocaine was injected into all the trocar sites.  The skin was closed with 4-0 Monocryl in running subcuticular fashion. Dermabond was applied.   At this point patient was awakened and extubated in the operating room and taken to the recovery room in stable condition. There were no complications. All counts correct.  [name], MD

## 2017-12-10 NOTE — Anesthesia Postprocedure Evaluation (Signed)
Anesthesia Post Note  Patient: Jerry Pena  Procedure(s) Performed: ROBOTIC ASSISTED LAPAROSCOPIC RADICAL PROSTATECTOMY (N/A Abdomen) PELVIC LYMPH NODE DISSECTION (N/A Abdomen)  Patient location during evaluation: PACU Anesthesia Type: General Level of consciousness: awake Pain management: pain level controlled Vital Signs Assessment: post-procedure vital signs reviewed and stable Respiratory status: spontaneous breathing Cardiovascular status: stable Anesthetic complications: no     Last Vitals:  Vitals:   12/10/17 1354 12/10/17 1357  BP: (!) 129/94 (!) 129/94  Pulse: 81 96  Resp: 16 17  Temp: 37.4 C   SpO2: 100% 100%    Last Pain:  Vitals:   12/10/17 1354  TempSrc:   PainSc: Asleep                 VAN STAVEREN,Shineka Auble

## 2017-12-11 ENCOUNTER — Telehealth: Payer: Self-pay | Admitting: Family Medicine

## 2017-12-11 ENCOUNTER — Encounter: Payer: Self-pay | Admitting: Urology

## 2017-12-11 DIAGNOSIS — C775 Secondary and unspecified malignant neoplasm of intrapelvic lymph nodes: Secondary | ICD-10-CM | POA: Diagnosis not present

## 2017-12-11 DIAGNOSIS — J449 Chronic obstructive pulmonary disease, unspecified: Secondary | ICD-10-CM | POA: Diagnosis not present

## 2017-12-11 DIAGNOSIS — F17210 Nicotine dependence, cigarettes, uncomplicated: Secondary | ICD-10-CM | POA: Diagnosis not present

## 2017-12-11 DIAGNOSIS — C61 Malignant neoplasm of prostate: Secondary | ICD-10-CM | POA: Diagnosis not present

## 2017-12-11 DIAGNOSIS — Z8042 Family history of malignant neoplasm of prostate: Secondary | ICD-10-CM | POA: Diagnosis not present

## 2017-12-11 LAB — BASIC METABOLIC PANEL
Anion gap: 5 (ref 5–15)
BUN: 13 mg/dL (ref 6–20)
CALCIUM: 8.2 mg/dL — AB (ref 8.9–10.3)
CO2: 27 mmol/L (ref 22–32)
CREATININE: 1.01 mg/dL (ref 0.61–1.24)
Chloride: 104 mmol/L (ref 101–111)
GFR calc non Af Amer: >60 mL/min (ref >60)
Glucose, Bld: 105 mg/dL — ABNORMAL HIGH (ref 65–99)
Potassium: 3.8 mmol/L (ref 3.5–5.1)
SODIUM: 136 mmol/L (ref 135–145)

## 2017-12-11 LAB — CBC
HCT: 36.8 % — ABNORMAL LOW (ref 40.0–52.0)
Hemoglobin: 11.8 g/dL — ABNORMAL LOW (ref 13.0–18.0)
MCH: 22.3 pg — ABNORMAL LOW (ref 26.0–34.0)
MCHC: 32 g/dL (ref 32.0–36.0)
MCV: 69.9 fL — ABNORMAL LOW (ref 80.0–100.0)
PLATELETS: 236 10*3/uL (ref 150–440)
RBC: 5.27 MIL/uL (ref 4.40–5.90)
RDW: 16.1 % — AB (ref 11.5–14.5)
WBC: 7.9 10*3/uL (ref 3.8–10.6)

## 2017-12-11 MED ORDER — DOCUSATE SODIUM 100 MG PO CAPS: 100.0000 mg | ORAL_CAPSULE | Freq: Two times a day (BID) | ORAL | 0 refills | Status: DC

## 2017-12-11 MED ORDER — OXYCODONE-ACETAMINOPHEN 5-325 MG PO TABS: 1.0000 | ORAL_TABLET | ORAL | 0 refills | Status: DC | PRN

## 2017-12-11 MED ORDER — OXYBUTYNIN CHLORIDE 5 MG PO TABS: 5.0000 mg | ORAL_TABLET | Freq: Three times a day (TID) | ORAL | 0 refills | Status: DC | PRN

## 2017-12-11 NOTE — Discharge Instructions (Signed)
· Activity:  You are encouraged to ambulate frequently (about every hour during waking hours) to help prevent blood clots from forming in your legs or lungs.  However, you should not engage in any heavy lifting (> 5-10 lbs), strenuous activity, or straining. ° °· Diet: You should advance your diet as instructed by your physician.  It will be normal to have some bloating, nausea, and abdominal discomfort intermittently. ° °· Prescriptions:  You will be provided a prescription for pain medication to take as needed.  If your pain is not severe enough to require the prescription pain medication, you may take extra strength Tylenol instead which will have less side effects.  You should also take a prescribed stool softener to avoid straining with bowel movements as the prescription pain medication may constipate you. ° °· Incisions: You may remove your dressing bandages 48 hours after surgery if not removed in the hospital.  You will either have some small staples or special tissue glue at each of the incision sites. Once the bandages are removed (if present), the incisions may stay open to air.  You may start showering (but not soaking or bathing in water) the 2nd day after surgery and the incisions simply need to be patted dry after the shower.  No additional care is needed. ° °What to call us about: You should call the office if you develop fever > 101 or develop persistent vomiting, redness or draining around your incision, or any other concerning symptoms.   ° °[facility] °[address], [address] °[place], [place] [address] °[phone] ° ° °Indwelling Urinary Catheter Care, Adult °Take good care of your catheter to keep it working and to prevent problems. °How to wear your catheter °Attach your catheter to your leg with tape (adhesive tape) or a leg strap. Make sure it is not too tight. If you use tape, remove any bits of tape that are already on the catheter. °How to wear a drainage  bag °You should have: °· A large overnight bag. °· A small leg bag. ° °Overnight Bag °You may wear the overnight bag at any time. Always keep the bag below the level of your bladder but off the floor. When you sleep, put a clean plastic bag in a wastebasket. Then hang the bag inside the wastebasket. °Leg Bag °Never wear the leg bag at night. Always wear the leg bag below your knee. Keep the leg bag secure with a leg strap or tape. °How to care for your skin °· Clean the skin around the catheter at least once every day. °· Shower every day. Do not take baths. °· Put creams, lotions, or ointments on your genital area only as told by your doctor. °· Do not use powders, sprays, or lotions on your genital area. °How to clean your catheter and your skin °1. Wash your hands with soap and water. °2. Wet a washcloth in warm water and gentle (mild) soap. °3. Use the washcloth to clean the skin where the catheter enters your body. Clean downward and wipe away from the catheter in small circles. Do not wipe toward the catheter. °4. Pat the area dry with a clean towel. Make sure to clean off all soap. °How to care for your drainage bags °Empty your drainage bag when it is ?-½ full or at least 2-3 times a day. Replace your drainage bag once a month or sooner if it starts to smell bad or look dirty. Do not clean your   drainage bag unless told by your doctor. °Emptying a drainage bag ° °Supplies Needed °· Rubbing alcohol. °· Gauze pad or cotton ball. °· Tape or a leg strap. ° °Steps °1. Wash your hands with soap and water. °2. Separate (detach) the bag from your leg. °3. Hold the bag over the toilet or a clean container. Keep the bag below your hips and bladder. This stops pee (urine) from going back into the tube. °4. Open the pour spout at the bottom of the bag. °5. Empty the pee into the toilet or container. Do not let the pour spout touch any surface. °6. Put rubbing alcohol on a gauze pad or cotton ball. °7. Use the gauze pad  or cotton ball to clean the pour spout. °8. Close the pour spout. °9. Attach the bag to your leg with tape or a leg strap. °10. Wash your hands. ° °Changing a drainage bag °Supplies Needed °· Alcohol wipes. °· A clean drainage bag. °· Adhesive tape or a leg strap. ° °Steps °1. Wash your hands with soap and water. °2. Separate the dirty bag from your leg. °3. Pinch the rubber catheter with your fingers so that pee does not spill out. °4. Separate the catheter tube from the drainage tube where these tubes connect (at the connection valve). Do not let the tubes touch any surface. °5. Clean the end of the catheter tube with an alcohol wipe. Use a different alcohol wipe to clean the end of the drainage tube. °6. Connect the catheter tube to the drainage tube of the clean bag. °7. Attach the new bag to the leg with adhesive tape or a leg strap. °8. Wash your hands. ° °How to prevent infection and other problems °· Never pull on your catheter or try to remove it. Pulling can damage tissue in your body. °· Always wash your hands before and after touching your catheter. °· If a leg strap gets wet, replace it with a dry one. °· Drink enough fluids to keep your pee clear or pale yellow, or as told by your doctor. °· Do not let the drainage bag or tubing touch the floor. °· Wear cotton underwear. °· If you are male, wipe from front to back after you poop (have a bowel movement). °· Check on the catheter often to make sure it works and the tubing is not twisted. °Get help if: °· Your pee is cloudy. °· Your pee smells unusually bad. °· Your pee is not draining into the bag. °· Your tube gets clogged. °· Your catheter starts to leak. °· Your bladder feels full. °Get help right away if: °· You have redness, swelling, or pain where the catheter enters your body. °· You have fluid, pus, or a bad smell coming from the area where the catheter enters your body. °· The area where the catheter enters your body feels warm. °· You have a  fever. °· You have pain in your: °? Stomach (abdomen). °? Legs. °? Lower back. °? Bladder. °· You see blood fill the catheter. °· Your pee is pink or red. °· You feel sick to your stomach (nauseous). °· You throw up (vomit). °· You have chills. °· Your catheter gets pulled out. °This information is not intended to replace advice given to you by your health care provider. Make sure you discuss any questions you have with your health care provider. °Document Released: 04/07/2013 Document Revised: 11/08/2016 Document Reviewed: 05/26/2014 °Elsevier Interactive Patient Education © 2018 [employer]vier Inc[employer] ° °  Inc. ° °

## 2017-12-11 NOTE — Telephone Encounter (Signed)
Please call patient this afternoon Check on him after prostate surgery See how he's feeling Any needs, anything we can do? We're thinking of him Thank you

## 2017-12-11 NOTE — Discharge Summary (Signed)
Date of admission: 12/10/2017  Date of discharge: 12/11/2017  Admission diagnosis: Prostate cancer  Discharge diagnosis: same  Secondary diagnoses:  Patient Active Problem List   Diagnosis Date Noted  . Prostate cancer ([place]) 12/10/2017  . Elevated PSA 09/12/2017  . Prostate cancer screening 06/13/2017  . Medication monitoring encounter 06/13/2017  . Facet hypertrophy of lumbar region 06/13/2017  . DDD (degenerative disc disease), lumbar 06/13/2017  . Preventative health care 09/28/2015  . Hyperlipidemia 09/09/2015  . Sciatica 09/09/2015  . Arthritis, lumbar spine 09/09/2015  . Tobacco abuse 09/09/2015  . History of colon polyps 09/09/2015    History and Physical: For full details, please see admission history and physical. Briefly, Jerry Pena is a 66 y.o. year old patient with prostate cancer admitted for observation following robotic radical prostatectomy with bilateral pelvic lymph node dissection.   Hospital Course: Patient tolerated the procedure well.  He was then transferred to the floor after an uneventful PACU stay.  His hospital course was uncomplicated.  On POD#*1 he had met discharge criteria: was eating a regular diet, was up and ambulating independently,  pain was well controlled, and was ready to for discharge.  He underwent Foley catheter teaching prior to discharge.  Physical Exam  Constitutional: He is oriented to person, place, and time. He appears well-developed.  HENT:  Head: Normocephalic and atraumatic.  Cardiovascular: Normal rate.  Pulmonary/Chest: Effort normal. No respiratory distress.  Abdominal:  Incisions clean dry and intact.  Nondistended.  Appropriately tender.  Genitourinary:  Genitourinary Comments: Foley catheter in place draining pink tinged urine.  Paraphimosis reduced.  Secured to left leg.  Neurological: He is alert and oriented to person, place, and time.  Skin: Skin is warm and dry.  Vitals reviewed.    Laboratory values:   Recent Labs    12/11/17 0341  WBC 7.9  HGB 11.8*  HCT 36.8*   Recent Labs    12/11/17 0341  NA 136  K 3.8  CL 104  CO2 27  GLUCOSE 105*  BUN 13  CREATININE 1.01  CALCIUM 8.2*   No results for input(s): LABPT, INR in the last 72 hours. No results for input(s): LABURIN in the last 72 hours. Results for orders placed or performed during the hospital encounter of 11/23/17  Urine culture     Status: None   Collection Time: 11/23/17  9:59 AM  Result Value Ref Range Status   Specimen Description URINE, RANDOM  Final   Special Requests NONE  Final   Culture   Final    NO GROWTH Performed at [facility], [address], [place], [place] [address]    Report Status 11/24/2017 FINAL  Final    Disposition: Home   Discharge instruction: Activity:  You are encouraged to ambulate frequently (about every hour during waking hours) to help prevent blood clots from forming in your legs or lungs.  However, you should not engage in any heavy lifting (> 5-10 lbs), strenuous activity, or straining.   Diet: You should advance your diet as instructed by your physician.  It will be normal to have some bloating, nausea, and abdominal discomfort intermittently.   Prescriptions:  You will be provided a prescription for pain medication to take as needed.  If your pain is not severe enough to require the prescription pain medication, you may take extra strength Tylenol instead which will have less side effects.  You should also take a prescribed stool softener to avoid straining with bowel movements as  the prescription pain medication may constipate you.   Incisions: You may remove your dressing bandages 48 hours after surgery if not removed in the hospital.  You will either have some small staples or special tissue glue at each of the incision sites. Once the bandages are removed (if present), the incisions may stay open to air.  You may start showering (but not soaking or bathing in water)  the 2nd day after surgery and the incisions simply need to be patted dry after the shower.  No additional care is needed.  What to call us about: You should call the office if you develop fever > 101 or develop persistent vomiting, redness or draining around your incision, or any other concerning symptoms.    [facility] [address] [place], [place] [address] [phone]    Discharge medications:  Allergies as of 12/11/2017      Reactions   Aspirin Other (See Comments)   Upset stomach, indigestion and heart burn   Penicillins Hives, Other (See Comments)   Has patient had a PCN reaction causing immediate rash, facial/tongue/throat swelling, SOB or lightheadedness with hypotension: Yes Has patient had a PCN reaction causing severe rash involving mucus membranes or skin necrosis: No Has patient had a PCN reaction that required hospitalization: Yes Has patient had a PCN reaction occurring within the last 10 years: No If all of the above answers are "NO", then may proceed with Cephalosporin use.      Medication List    TAKE these medications   acetaminophen 500 MG tablet Commonly known as:  TYLENOL Take 1,000 mg by mouth every 6 (six) hours as needed for moderate pain or headache.   docusate sodium 100 MG capsule Commonly known as:  COLACE Take 1 capsule (100 mg total) by mouth 2 (two) times daily.   ibuprofen 200 MG tablet Commonly known as:  ADVIL,MOTRIN Take 400 mg by mouth every 6 (six) hours as needed for headache or moderate pain.   oxybutynin 5 MG tablet Commonly known as:  DITROPAN Take 1 tablet (5 mg total) by mouth every 8 (eight) hours as needed for bladder spasms.   oxyCODONE-acetaminophen 5-325 MG tablet Commonly known as:  PERCOCET Take 1-2 tablets by mouth every 4 (four) hours as needed for moderate pain or severe pain.       Followup:  Follow-up Information    [name], MD In 1 month.   Specialty:   Urology Why:  post op (PSA just prior to f/u) Contact information: [address] [place] [id number] [phone]

## 2017-12-11 NOTE — Progress Notes (Signed)
Discharge teaching, paperwork, follow up appointments, and prescriptions provided. Patient given leg bag, bedside bag, leg strap, and 10 ml syringe as ordered. Demonstrated to patient how to switch between leg bag and bedside bag and how to empty each type of foley bag. Patient verbalized understanding of each process. Educated patient on how to remove foley catheter using 10 ml syringe. Patient verbalized understanding. No questions or concerns. Discharged home. Escorted via wheelchair. ELQ

## 2017-12-11 NOTE — Care Management Obs Status (Signed)
[facility] NOTIFICATION   Patient Details  Name: Lawson Mahone MRN: [id number] Date of Birth: October 26, 1951   Medicare Observation Status Notification Given:  No(admitted less than 24 hours)    Beverly Sessions, RN 12/11/2017, 10:35 AM

## 2017-12-11 NOTE — Telephone Encounter (Signed)
Called pt, he states that he is doing much better. Yesterday didn't feel so well after anesthesia but much improved today. Thanks [place] for calling. Informed pt that he will be in our thoughts and prayers.

## 2017-12-12 LAB — SURGICAL PATHOLOGY

## 2017-12-14 ENCOUNTER — Ambulatory Visit: Payer: Medicare HMO | Admitting: Urology

## 2017-12-14 ENCOUNTER — Encounter: Payer: Self-pay | Admitting: Urology

## 2017-12-14 ENCOUNTER — Telehealth: Payer: Self-pay | Admitting: Radiology

## 2017-12-14 ENCOUNTER — Ambulatory Visit (INDEPENDENT_AMBULATORY_CARE_PROVIDER_SITE_OTHER): Payer: Medicare HMO | Admitting: Urology

## 2017-12-14 VITALS — BP 127/81 | HR 92 | Ht 67.0 in | Wt 140.0 lb

## 2017-12-14 DIAGNOSIS — T839XXA Unspecified complication of genitourinary prosthetic device, implant and graft, initial encounter: Secondary | ICD-10-CM

## 2017-12-14 DIAGNOSIS — C61 Malignant neoplasm of prostate: Secondary | ICD-10-CM

## 2017-12-14 NOTE — Progress Notes (Signed)
Subjective:     Patient ID: Jerry Pena, male   DOB: 11-08-1951, 66 y.o.   MRN: [id number]  66 year old male postop day 4 status post prostatectomy with bilateral pelvic lymph node dissection issues with his catheter overnight.  He noted that it stopped draining around 11 PM with leakage around the catheter.  He was also having severe bladder spasms.  Around 3 AM, he elected to remove his own catheter without consultation from any medical professional.    Since the catheter has been removed, he is dripping continuously.  His urine is clear yellow.  No abdominal pain.  Does report having significant bladder spasms when straining to have a bowel movement.  These persist for approximately 20-30 after the start.  He is taking oxybutynin as needed.  He is very bothered by these.  He is extremely hesitant to have his catheter replaced today.       Objective:   Physical Exam  Constitutional: He appears well-developed.  HENT:  Head: Normocephalic and atraumatic.  Cardiovascular: Normal rate.  Pulmonary/Chest: No respiratory distress.  Abdominal: Soft. He exhibits no distension. There is no tenderness.  Clean dry and intact.  Genitourinary:  Genitourinary Comments: Uncircumcised with bilateral descended testicles.  Clear urine seen dripping from meatus.  Musculoskeletal: He exhibits no edema.  Psychiatric: He has a normal mood and affect.   After lengthy discussion, the patient ultimately was agreeable to having his catheter placed.  The patient was prepped and draped in standard surgical fashion.  Attention was applied.  Urethral meatus to sit for several minutes.  Foley catheter placement.  First attempt at placing a 46 French standard Foley catheter was unsuccessful meeting resistance at the anastomosis.  There was blood tinged urine at the tip of the catheter upon removal.  I made one final attempt using a 16 [place] coud which was ultimately successful.  The bladder was irrigated several  times using 60 cc time.  No clots were identified.  This confirmed adequate position.  The balloon was filled with 10 cc of sterile water.  It was secured to the patient's right thigh.  Catheter teaching was also performed again today.     Assessment:     66 year old male status post robotic prostatectomy with bilateral pelvic patient removed his own catheter [place] earlier this morning after failed to drain properly.    Plan:     -Foley catheter replaced, keep an additional week due to concern for trauma of the anastomosis -We discussed possible implications and complications of premature Foley catheter including worsening of his stress incontinence and slow recovery -Myrbetriq 25 mg x 1 week given in addition to oxybutynin to help with bladder spasms -Importance of maintaining the catheter was reviewed again in detail with the patient -Pathology was reviewed today with the patient, will defer this discussion until his follow-up visit with PSA  [name], MD

## 2017-12-14 NOTE — Telephone Encounter (Signed)
Pt states he found a ride & will arrive asap.

## 2017-12-14 NOTE — Telephone Encounter (Signed)
Pt called stating he noticed there was very little urine in his catheter bag last night around 11:30 & his bladder felt full. After calling the nurse line he continued to feel pressure but still no urine was going into the bag. At that time pt removed his catheter in the manner explained to him upon discharge from [facility]. Pt was still unable to urinate so he pressed on his bladder & after a blood clot came out he was able to urinate. Pt states there is no bleeding at this time. Advised pt to RTC immediately. Pt states he will try to find a ride. Explained the importance of returning to clinic & urged pt to find a ride asap. Pt voices understanding.

## 2017-12-21 ENCOUNTER — Ambulatory Visit (INDEPENDENT_AMBULATORY_CARE_PROVIDER_SITE_OTHER): Payer: Medicare HMO

## 2017-12-21 VITALS — BP 122/77 | HR 72 | Ht 67.0 in | Wt 136.0 lb

## 2017-12-21 DIAGNOSIS — C61 Malignant neoplasm of prostate: Secondary | ICD-10-CM

## 2017-12-21 DIAGNOSIS — Z4689 Encounter for fitting and adjustment of other specified devices: Secondary | ICD-10-CM | POA: Diagnosis not present

## 2017-12-21 NOTE — Progress Notes (Signed)
Catheter Removal  Patient is present today for a catheter removal.  73ml of water was drained from the balloon. A 16FR coude foley cath was removed from the bladder no complications were noted . Patient tolerated well.  Preformed by: Toniann Fail, LPN   Follow up/ Additional notes: Reinforced with pt to increase fluid in take today and RTC by 3pm if not able to urinate. Pt voiced understanding.   Blood pressure 122/77, pulse 72, height 5\' 7"  (1.702 m), weight 136 lb (61.7 kg).

## 2017-12-27 ENCOUNTER — Other Ambulatory Visit: Payer: Medicare HMO

## 2018-01-02 ENCOUNTER — Ambulatory Visit: Payer: Medicare HMO | Admitting: Urology

## 2018-01-03 ENCOUNTER — Other Ambulatory Visit: Payer: Medicare HMO

## 2018-01-03 DIAGNOSIS — C61 Malignant neoplasm of prostate: Secondary | ICD-10-CM

## 2018-01-04 LAB — PSA: Prostate Specific Ag, Serum: 0.2 ng/mL (ref 0.0–4.0)

## 2018-01-08 ENCOUNTER — Encounter: Payer: Self-pay | Admitting: Urology

## 2018-01-08 ENCOUNTER — Ambulatory Visit (INDEPENDENT_AMBULATORY_CARE_PROVIDER_SITE_OTHER): Payer: Medicare HMO | Admitting: Urology

## 2018-01-08 VITALS — BP 93/59 | HR 72 | Ht 67.0 in | Wt 138.0 lb

## 2018-01-08 DIAGNOSIS — N5231 Erectile dysfunction following radical prostatectomy: Secondary | ICD-10-CM

## 2018-01-08 DIAGNOSIS — C61 Malignant neoplasm of prostate: Secondary | ICD-10-CM

## 2018-01-08 DIAGNOSIS — N393 Stress incontinence (female) (male): Secondary | ICD-10-CM

## 2018-01-08 MED ORDER — TADALAFIL 5 MG PO TABS: 5.0000 mg | ORAL_TABLET | Freq: Every day | ORAL | 11 refills | Status: DC | PRN

## 2018-01-08 NOTE — Progress Notes (Signed)
01/08/2018 1:15 PM   Arma Heading May 09, 1951 [id number]  Referring provider: Arnetha Courser, MD [address] [place], [place] [address]  Chief Complaint  Patient presents with  . Prostate Cancer    HPI: 67 year old male with a history of Gleason 4+3 prostate cancer who underwent radical robotic prostatectomy with bilateral pelvic lymph node dissection on 12/10/2017.  A non-nerve sparing approach was used on the right side with partial nerve sparing on the left.  His postoperative course was complicated by premature Foley removal by the patient himself.  This was replaced in the clinic and remained for an additional week.  Ultimately, his catheter was removed on 12/21/2017.  Surgical pathology consistent with Gleason 4+3 prostate cancer since of extraprostatic extension but negative margins.  In addition to this, 1 of 4 nodes from the right pelvic lymph nodes including external iliac and obturator positive, 0 of 2 on the left. pT3a N1 Mx.    He reports that overall, he is pleased with the progress in his urinary incontinence.  Initially, he was copiously wet but now is only leaking 3-4 pads per day which are not saturated.  If he wears a depends, this is not at all saturated.  He is dry at night.  He is able to control his urinary flow.  He sees improvement on a daily basis.  He is started doing his pelvic floor exercises again routinely.  He would like to discuss his erectile status today as well.  He reports that he has had some partial erections with penile fullness but not a true erection.  He is interested in augmenting this medication.  Postop PSA 0.2.  PMH: Past Medical History:  Diagnosis Date  . Cancer ([facility]) 10/2017   Prostate  . DDD (degenerative disc disease), lumbar 06/13/2017   Lumbar imaging June 2018  . Elevated PSA 09/12/2017   Refer to urologist, Sept 2018  . Facet hypertrophy of lumbar region 06/13/2017   Lumbar imaging June 2018  . Hyperlipidemia       Surgical History: Past Surgical History:  Procedure Laterality Date  . CATARACT EXTRACTION W/ INTRAOCULAR LENS IMPLANT Left 3/ 28/2014   eyes  . CATARACT EXTRACTION W/ INTRAOCULAR LENS IMPLANT Right 04/23/2013  . PELVIC LYMPH NODE DISSECTION N/A 12/10/2017   Procedure: PELVIC LYMPH NODE DISSECTION;  Surgeon: [name], MD;  Location: [facility] ORS;  Service: Urology;  Laterality: N/A;  . ROBOT ASSISTED LAPAROSCOPIC RADICAL PROSTATECTOMY N/A 12/10/2017   Procedure: ROBOTIC ASSISTED LAPAROSCOPIC RADICAL PROSTATECTOMY;  Surgeon: [name], MD;  Location: [facility] ORS;  Service: Urology;  Laterality: N/A;    Home Medications:  Allergies as of 01/08/2018      Reactions   Aspirin Other (See Comments)   Upset stomach, indigestion and heart burn   Penicillins Hives, Other (See Comments)   Has patient had a PCN reaction causing immediate rash, facial/tongue/throat swelling, SOB or lightheadedness with hypotension: Yes Has patient had a PCN reaction causing severe rash involving mucus membranes or skin necrosis: No Has patient had a PCN reaction that required hospitalization: Yes Has patient had a PCN reaction occurring within the last 10 years: No If all of the above answers are "NO", then may proceed with Cephalosporin use.      Medication List        Accurate as of 01/08/18  1:15 PM. Always use your most recent med list.          acetaminophen 500 MG tablet Commonly known as:  TYLENOL Take 1,000 mg by mouth every 6 (six) hours as needed for moderate pain or headache.   AZO CONFIDENCE PO Take by mouth.   docusate sodium 100 MG capsule Commonly known as:  COLACE Take 1 capsule (100 mg total) by mouth 2 (two) times daily.   ibuprofen 200 MG tablet Commonly known as:  ADVIL,MOTRIN Take 400 mg by mouth every 6 (six) hours as needed for headache or moderate pain.   oxybutynin 5 MG tablet Commonly known as:  DITROPAN Take 5 mg by mouth 3 (three) times daily.   tadalafil 5  MG tablet Commonly known as:  CIALIS Take 1 tablet (5 mg total) by mouth daily as needed for erectile dysfunction.       Allergies:  Allergies  Allergen Reactions  . Aspirin Other (See Comments)    Upset stomach, indigestion and heart burn  . Penicillins Hives and Other (See Comments)    Has patient had a PCN reaction causing immediate rash, facial/tongue/throat swelling, SOB or lightheadedness with hypotension: Yes Has patient had a PCN reaction causing severe rash involving mucus membranes or skin necrosis: No Has patient had a PCN reaction that required hospitalization: Yes Has patient had a PCN reaction occurring within the last 10 years: No If all of the above answers are "NO", then may proceed with Cephalosporin use.     Family History: Family History  Problem Relation Age of Onset  . Cancer Sister        breast  . Cancer Son        prostate  . Prostate cancer Son   . Bladder Cancer Neg Hx   . Kidney cancer Neg Hx     Social History:  reports that he has been smoking cigarettes.  he has never used smokeless tobacco. He reports that he drinks about 1.2 oz of alcohol per week. He reports that he does not use drugs.  ROS: UROLOGY Frequent Urination?: No Hard to postpone urination?: No Burning/pain with urination?: No Get up at night to urinate?: No Leakage of urine?: Yes Urine stream starts and stops?: No Trouble starting stream?: No Do you have to strain to urinate?: No Blood in urine?: No Urinary tract infection?: No Sexually transmitted disease?: No Injury to kidneys or bladder?: No Painful intercourse?: No Weak stream?: No Erection problems?: Yes Penile pain?: No  Gastrointestinal Nausea?: No Vomiting?: No Indigestion/heartburn?: No Diarrhea?: No Constipation?: No  Constitutional Fever: No Night sweats?: No Weight loss?: No Fatigue?: No  Skin Skin rash/lesions?: No Itching?: No  Eyes Blurred vision?: No Double vision?:  No  Ears/Nose/Throat Sore throat?: No Sinus problems?: No  Hematologic/Lymphatic Swollen glands?: No Easy bruising?: No  Cardiovascular Leg swelling?: No Chest pain?: No  Respiratory Cough?: No Shortness of breath?: No  Endocrine Excessive thirst?: No  Musculoskeletal Back pain?: No Joint pain?: No  Neurological Headaches?: No Dizziness?: No  Psychologic Depression?: No Anxiety?: No  Physical Exam: BP (!) 93/59   Pulse 72   Ht 5\' 7"  (1.702 m)   Wt 138 lb (62.6 kg)   BMI 21.61 kg/m   Constitutional:  Alert and oriented, No acute distress. HEENT: [place] AT, moist mucus membranes.  Trachea midline, no masses. Cardiovascular: No clubbing, cyanosis, or edema. Respiratory: Normal respiratory effort, no increased work of breathing. GI: Abdomen is soft, nontender, wounds healing well.  No evidence of hernia. Skin: No rashes, bruises or suspicious lesions. Neurologic: Grossly intact, no focal deficits, moving all 4 extremities. Psychiatric: Normal mood and affect.  Laboratory Data: Lab Results  Component Value Date   WBC 7.9 12/11/2017   HGB 11.8 (L) 12/11/2017   HCT 36.8 (L) 12/11/2017   MCV 69.9 (L) 12/11/2017   PLT 236 12/11/2017    Lab Results  Component Value Date   CREATININE 1.01 12/11/2017    Lab Results  Component Value Date   PSA1 0.2 01/03/2018   PSA1 7.5 (H) 10/02/2017     Urinalysis N/a  Pertinent Imaging: n/a  Assessment & Plan:    1. Prostate cancer [facility]) Surgical pathology reviewed with the patient, pT3a N1 with persistent PSA of 0.2 I recommended further staging with to ensure that there is no evidence of additional metastatic disease other than local regional If this is otherwise negative, would highly consider adjuvant radiation pole pelvis with ADT I will call him with his PET scan results and further recommendations He is agreeable to this plan - NM PET (AXUMIN) SKULL BASE TO MID THIGH; Future  2. Stress incontinence of  urine Continue pelvic floor exercises Overall significant improvement which is reassuring  3. Erectile dysfunction after radical prostatectomy Interested in penile rehab Will start with Cialis daily if this is covered by his insurance company Alternatively, discussed generic sildenafil as an option discussed how she take these medications (he will call us and let us know if he would like to fill this prescription)    [name], MD  [place] [address], [employer] [place], [place] [address] [phone]

## 2018-01-09 ENCOUNTER — Telehealth: Payer: Self-pay | Admitting: Urology

## 2018-01-09 NOTE — Telephone Encounter (Signed)
-----   Message from [name], MD sent at 01/08/2018  1:16 PM EST ----- I ordered a PET for this patient- please arrange

## 2018-01-09 NOTE — Telephone Encounter (Signed)
PET scan was approved today and scheduling has been notified to call the patient to get this scheduled ASAP.  Thanks, Sharyn Lull

## 2018-01-11 ENCOUNTER — Telehealth: Payer: Self-pay | Admitting: Radiology

## 2018-01-11 NOTE — Telephone Encounter (Signed)
Pt called re: cialis prescription prior auth. Made pt aware that medication is not a formulary medication with his insurance & that pharmacy advised they would send request for an alternative medication. Pt voices understanding.

## 2018-01-17 ENCOUNTER — Encounter
Admission: RE | Admit: 2018-01-17 | Discharge: 2018-01-17 | Disposition: A | Discharge status: Home / Self Care | Payer: Medicare HMO | Source: Ambulatory Visit | Attending: Urology | Admitting: Urology

## 2018-01-17 DIAGNOSIS — C61 Malignant neoplasm of prostate: Secondary | ICD-10-CM | POA: Insufficient documentation

## 2018-01-17 DIAGNOSIS — N289 Disorder of kidney and ureter, unspecified: Secondary | ICD-10-CM | POA: Diagnosis not present

## 2018-01-17 MED ORDER — AXUMIN (FLUCICLOVINE F 18) INJECTION
10.0000 | Freq: Once | INTRAVENOUS | Status: AC
  Administered 2018-01-17: 10.6 via INTRAVENOUS

## 2018-01-21 NOTE — Telephone Encounter (Signed)
Results of Axumin PET scan reviewed with the patient by telephone today.  No evidence of metastatic disease or any metabolically active disease.  Given evidence of disease, would recommend Lupron.  We discussed the side effects of this medication in detail including risk of hot flashes, loss of muscle mass, loss of bone mass, and cardiovascular side effects for long-term ADT.  He understands all of these and is willing to have this injection done.  I also sent a message to Dr. Noreene Filbert asking about the role of radiation which could be considered.  I am waiting to hear back from him.  Would likely want to wait until he has regained some of his continence before we offered him any adjuvant radiation.  Patient is agreeable to this plan.  He will come in and have a repeat PSA drawn as well as be administered at 38-month Depo-Lupron.  I will see him in 3 months after his injection at the time of his next Lupron due date to discuss his continence at that point and role for radiation.  [name], MD

## 2018-01-21 NOTE — Telephone Encounter (Signed)
apps made left message for patient to call back to confirm  michelle

## 2018-01-23 ENCOUNTER — Ambulatory Visit: Payer: Medicare HMO

## 2018-01-28 ENCOUNTER — Telehealth: Payer: Self-pay | Admitting: Radiology

## 2018-01-28 ENCOUNTER — Ambulatory Visit (INDEPENDENT_AMBULATORY_CARE_PROVIDER_SITE_OTHER): Payer: Medicare HMO

## 2018-01-28 VITALS — BP 120/81 | HR 72 | Ht 67.0 in | Wt 138.0 lb

## 2018-01-28 DIAGNOSIS — C61 Malignant neoplasm of prostate: Secondary | ICD-10-CM

## 2018-01-28 MED ORDER — LEUPROLIDE ACETATE (3 MONTH) 22.5 MG IM KIT
22.5000 mg | PACK | Freq: Once | INTRAMUSCULAR | Status: AC
  Administered 2018-01-28: 22.5 mg via INTRAMUSCULAR

## 2018-01-28 MED ORDER — SILDENAFIL CITRATE 20 MG PO TABS: 20.0000 mg | ORAL_TABLET | ORAL | 11 refills | Status: DC | PRN

## 2018-01-28 NOTE — Telephone Encounter (Signed)
Pt notified of script sent to pharmacy. No questions at this time.

## 2018-01-28 NOTE — Progress Notes (Signed)
Lupron IM Injection   Due to Prostate Cancer patient is present today for a Lupron Injection.  Medication: Lupron 3 month Dose: 22.5 mg  Location: left upper outer buttocks Lot: [id number] Exp: 07/29/2020  Patient tolerated well, no complications were noted  Performed by: Toniann Fail, LPN   Follow up: 3 months to see Dr. Erlene Quan and receive next injection.   Blood pressure 120/81, pulse 72, height 5\' 7"  (1.702 m), weight 138 lb (62.6 kg).

## 2018-01-28 NOTE — Telephone Encounter (Signed)
Notified pt that per [facility], Medicare denies all prescriptions for ED & they go by Medicare guidelines. Ref #[id number] Pt asks for prescription of Viagra to be sent to [facility] he will pay out of pocket. Please advise.

## 2018-01-28 NOTE — Telephone Encounter (Signed)
Script sent to pharmacy.  Jarrah Seher, MD  

## 2018-04-19 ENCOUNTER — Other Ambulatory Visit: Payer: Self-pay

## 2018-04-19 ENCOUNTER — Other Ambulatory Visit: Payer: Medicare HMO

## 2018-04-19 ENCOUNTER — Telehealth: Payer: Self-pay | Admitting: Urology

## 2018-04-19 DIAGNOSIS — R972 Elevated prostate specific antigen [PSA]: Secondary | ICD-10-CM

## 2018-04-19 NOTE — Telephone Encounter (Signed)
Pt came in for lab work today and asked if someone could give him a call.  He said Dr Erlene Quan told him he could possibly have hot flashes after surgery.  He wants to know if he can take anything or do anything about this.  Please give pt a call.

## 2018-04-19 NOTE — Telephone Encounter (Signed)
Hot flashes are from the Lupron.  They will improve over time.  If he would like to come in and discuss medical options, he can make an appointment.  The medications used are antidepressants.  [name], MD

## 2018-04-19 NOTE — Telephone Encounter (Signed)
LMOM for patient to call back and schedule an appointment if he wanted to discuss medical options for his Hot Flashes. I did tell him the hot flashes are coming from the Lupron injection.

## 2018-04-20 LAB — PSA: Prostate Specific Ag, Serum: <0.1 ng/mL (ref 0.0–4.0)

## 2018-04-23 ENCOUNTER — Ambulatory Visit: Payer: Medicare HMO | Admitting: Urology

## 2018-04-25 NOTE — Telephone Encounter (Addendum)
Pt states he does not want to continue Lupron injections due to the severity of the hot flashes. He has an appt with Dr Erlene Quan on 05/08/2018 & states he will discuss this further with her at that time.

## 2018-05-01 ENCOUNTER — Ambulatory Visit: Payer: Medicare HMO | Admitting: Urology

## 2018-05-08 ENCOUNTER — Ambulatory Visit: Payer: Medicare HMO | Admitting: Urology

## 2018-05-24 ENCOUNTER — Ambulatory Visit (INDEPENDENT_AMBULATORY_CARE_PROVIDER_SITE_OTHER): Payer: Medicare HMO | Admitting: Urology

## 2018-05-24 ENCOUNTER — Encounter: Payer: Self-pay | Admitting: Urology

## 2018-05-24 VITALS — BP 103/70 | HR 89 | Ht 67.0 in | Wt 140.6 lb

## 2018-05-24 DIAGNOSIS — N5231 Erectile dysfunction following radical prostatectomy: Secondary | ICD-10-CM | POA: Diagnosis not present

## 2018-05-24 DIAGNOSIS — C61 Malignant neoplasm of prostate: Secondary | ICD-10-CM | POA: Diagnosis not present

## 2018-05-24 DIAGNOSIS — N393 Stress incontinence (female) (male): Secondary | ICD-10-CM

## 2018-05-24 NOTE — Progress Notes (Signed)
05/24/2018 10:16 AM   Arma Heading 08-13-1951 [id number]  Referring provider: Arnetha Courser, MD [address] [place], [place] [address]  Chief Complaint  Patient presents with  . Follow-up    prostate cancer    HPI: 67 year old male with a history of Gleason 4+3 prostate cancer who underwent radical robotic prostatectomy with bilateral pelvic lymph node dissection on 12/10/2017.  A non-nerve sparing approach was used on the right side with partial nerve sparing on the left.  His postoperative course was complicated by premature Foley removal by the patient himself.  This was replaced in the clinic and remained for an additional week.  Ultimately, his catheter was removed on 12/21/2017.  Surgical pathology consistent with Gleason 4+3 prostate cancer since of extraprostatic extension but negative margins.  In addition to this, 1 of 4 nodes from the right pelvic lymph nodes including external iliac and obturator positive, 0 of 2 on the left. pT3a N1 Mx.  Postoperatively, he had an [place] PET scan which showed no evidence of prostate cancer within the prostatectomy bed or residual nodal mets.  No evidence of distant disease or skeletal disease appreciated.  In the setting of metastatic diease to the nodes, he was started on Lupron but struggling with hot flashes.  He does not want to take the medication anymore.  He is miserable and has a hot flash as frequently as every 15 minutes.  Postop PSA 0.2.  Most recent PSA <0.01 on 24/26/2019.    Leaking 2 ppd which are moist but not saturated.    He has no erectile function.  He has tried sildenafil without response.  He may be interested in intracavernosal injections.   PMH: Past Medical History:  Diagnosis Date  . Cancer ([place]) 10/2017   Prostate  . DDD (degenerative disc disease), lumbar 06/13/2017   Lumbar imaging June 2018  . Elevated PSA 09/12/2017   Refer to urologist, Sept 2018  . Facet hypertrophy of lumbar  region 06/13/2017   Lumbar imaging June 2018  . Hyperlipidemia     Surgical History: Past Surgical History:  Procedure Laterality Date  . CATARACT EXTRACTION W/ INTRAOCULAR LENS IMPLANT Left 3/ 28/2014   eyes  . CATARACT EXTRACTION W/ INTRAOCULAR LENS IMPLANT Right 04/23/2013  . PELVIC LYMPH NODE DISSECTION N/A 12/10/2017   Procedure: PELVIC LYMPH NODE DISSECTION;  Surgeon: [name], MD;  Location: [facility] ORS;  Service: Urology;  Laterality: N/A;  . ROBOT ASSISTED LAPAROSCOPIC RADICAL PROSTATECTOMY N/A 12/10/2017   Procedure: ROBOTIC ASSISTED LAPAROSCOPIC RADICAL PROSTATECTOMY;  Surgeon: [name], MD;  Location: [facility] ORS;  Service: Urology;  Laterality: N/A;    Home Medications:  Allergies as of 05/24/2018      Reactions   Aspirin Other (See Comments)   Upset stomach, indigestion and heart burn   Penicillins Hives, Other (See Comments)   Has patient had a PCN reaction causing immediate rash, facial/tongue/throat swelling, SOB or lightheadedness with hypotension: Yes Has patient had a PCN reaction causing severe rash involving mucus membranes or skin necrosis: No Has patient had a PCN reaction that required hospitalization: Yes Has patient had a PCN reaction occurring within the last 10 years: No If all of the above answers are "NO", then may proceed with Cephalosporin use.      Medication List        Accurate as of 05/24/18 10:16 AM. Always use your most recent med list.          acetaminophen 500 MG tablet Commonly  known as:  TYLENOL Take 1,000 mg by mouth every 6 (six) hours as needed for moderate pain or headache.   docusate sodium 100 MG capsule Commonly known as:  COLACE Take 1 capsule (100 mg total) by mouth 2 (two) times daily.   ibuprofen 200 MG tablet Commonly known as:  ADVIL,MOTRIN Take 400 mg by mouth every 6 (six) hours as needed for headache or moderate pain.   sildenafil 20 MG tablet Commonly known as:  REVATIO Take 1 tablet (20 mg total) by  mouth as needed. Take 1-5 tabs as needed prior to intercourse       Allergies:  Allergies  Allergen Reactions  . Aspirin Other (See Comments)    Upset stomach, indigestion and heart burn  . Penicillins Hives and Other (See Comments)    Has patient had a PCN reaction causing immediate rash, facial/tongue/throat swelling, SOB or lightheadedness with hypotension: Yes Has patient had a PCN reaction causing severe rash involving mucus membranes or skin necrosis: No Has patient had a PCN reaction that required hospitalization: Yes Has patient had a PCN reaction occurring within the last 10 years: No If all of the above answers are "NO", then may proceed with Cephalosporin use.     Family History: Family History  Problem Relation Age of Onset  . Cancer Sister        breast  . Cancer Son        prostate  . Prostate cancer Son   . Bladder Cancer Neg Hx   . Kidney cancer Neg Hx     Social History:  reports that he has been smoking cigarettes.  He has never used smokeless tobacco. He reports that he drinks about 1.2 oz of alcohol per week. He reports that he does not use drugs.  ROS: UROLOGY Frequent Urination?: Yes Hard to postpone urination?: No Burning/pain with urination?: No Get up at night to urinate?: No Leakage of urine?: Yes Urine stream starts and stops?: No Trouble starting stream?: No Do you have to strain to urinate?: No Blood in urine?: No Urinary tract infection?: No Sexually transmitted disease?: No Injury to kidneys or bladder?: No Painful intercourse?: No Weak stream?: No Erection problems?: Yes Penile pain?: No  Gastrointestinal Nausea?: No Vomiting?: No Indigestion/heartburn?: No Diarrhea?: No Constipation?: No  Constitutional Fever: No Night sweats?: Yes Weight loss?: No Fatigue?: No  Skin Skin rash/lesions?: No Itching?: Yes  Eyes Blurred vision?: No Double vision?: No  Ears/Nose/Throat Sore throat?: No Sinus problems?:  No  Hematologic/Lymphatic Swollen glands?: No Easy bruising?: No  Cardiovascular Leg swelling?: No Chest pain?: No  Respiratory Cough?: No Shortness of breath?: No  Endocrine Excessive thirst?: No  Musculoskeletal Back pain?: Yes Joint pain?: Yes  Neurological Headaches?: No Dizziness?: No  Psychologic Depression?: No Anxiety?: No  Physical Exam: BP 103/70 (BP Location: Left Arm, Patient Position: Sitting, Cuff Size: Normal)   Pulse 89   Ht 5\' 7"  (1.702 m)   Wt 140 lb 9.6 oz (63.8 kg)   BMI 22.02 kg/m   Constitutional:  Alert and oriented, No acute distress. HEENT: [place] AT, moist mucus membranes.  Trachea midline, no masses. Cardiovascular: No clubbing, cyanosis, or edema. Respiratory: Normal respiratory effort, no increased work of breathing. Skin: No rashes, bruises or suspicious lesions. Neurologic: Grossly intact, no focal deficits, moving all 4 extremities. Psychiatric: Normal mood and affect.  Laboratory Data: Lab Results  Component Value Date   WBC 7.9 12/11/2017   HGB 11.8 (L) 12/11/2017   HCT 36.8 (  L) 12/11/2017   MCV 69.9 (L) 12/11/2017   PLT 236 12/11/2017    Lab Results  Component Value Date   CREATININE 1.01 12/11/2017    Component     Latest Ref Rng & Units 10/02/2017 01/03/2018 04/19/2018  Prostate Specific Ag, Serum     0.0 - 4.0 ng/mL 7.5 (H) 0.2 <0.1    Urinalysis N/a  Pertinent Imaging: No new interval imaging  Assessment & Plan:     1. Prostate cancer ([place]) pT3a N1 with persistent PSA of 0.2 s/p RALP with BPNLD 11/2017 Staging via [facility] PET negative Refuses any further Lupron secondary to hot flashes today, declined treatment with antidepressants, etc. for symptom control Case discussed with Dr. Baruch Gouty, may be a candidate for whole pelvic salvage radiation Will refer to radiation oncology to discuss this further  2. Stress incontinence of urine Continue pelvic floor exercises Improving slowly  3. Erectile  dysfunction after radical prostatectomy No response to PDE 5 inhibitors Interested in intracavernosal injections We will arrange for teaching session, advised to pick up test dose 1 day prior to visit   Return for injection teaching with shannon then MD visit with me 6 mo.  [name], MD  [facility] [address], [place], [place] [address] [phone]

## 2018-05-30 ENCOUNTER — Encounter: Payer: Self-pay | Admitting: Radiation Oncology

## 2018-05-30 ENCOUNTER — Ambulatory Visit
Admission: RE | Admit: 2018-05-30 | Discharge: 2018-05-30 | Disposition: A | Discharge status: Home / Self Care | Payer: Medicare HMO | Source: Ambulatory Visit | Attending: Radiation Oncology | Admitting: Radiation Oncology

## 2018-05-30 ENCOUNTER — Other Ambulatory Visit: Payer: Self-pay

## 2018-05-30 VITALS — BP 115/70 | HR 76 | Temp 95.9°F | Resp 18 | Ht 68.0 in | Wt 143.0 lb

## 2018-05-30 DIAGNOSIS — N529 Male erectile dysfunction, unspecified: Secondary | ICD-10-CM | POA: Insufficient documentation

## 2018-05-30 DIAGNOSIS — E785 Hyperlipidemia, unspecified: Secondary | ICD-10-CM | POA: Insufficient documentation

## 2018-05-30 DIAGNOSIS — Z79899 Other long term (current) drug therapy: Secondary | ICD-10-CM | POA: Diagnosis not present

## 2018-05-30 DIAGNOSIS — Z8042 Family history of malignant neoplasm of prostate: Secondary | ICD-10-CM | POA: Insufficient documentation

## 2018-05-30 DIAGNOSIS — F17210 Nicotine dependence, cigarettes, uncomplicated: Secondary | ICD-10-CM | POA: Insufficient documentation

## 2018-05-30 DIAGNOSIS — Z809 Family history of malignant neoplasm, unspecified: Secondary | ICD-10-CM | POA: Diagnosis not present

## 2018-05-30 DIAGNOSIS — M5136 Other intervertebral disc degeneration, lumbar region: Secondary | ICD-10-CM | POA: Diagnosis not present

## 2018-05-30 DIAGNOSIS — C61 Malignant neoplasm of prostate: Secondary | ICD-10-CM | POA: Insufficient documentation

## 2018-05-30 NOTE — Consult Note (Signed)
NEW PATIENT EVALUATION  Name: Jerry Pena  MRN: [id number]  Date:   05/30/2018     DOB: [birth year]   This 67 y.o. male patient presents to the clinic for initial evaluation of stage IV (T3 N1 M0) pathologic staging adenocarcinoma the prostate Gleason score of 7 (4+3) status post robotic prostatectomy and pelvic lymph node sampling.  REFERRING PHYSICIAN: Arnetha Courser, MD  CHIEF COMPLAINT:  Chief Complaint  Patient presents with  . Prostate Cancer    Pt is here for initial consultation of prostate cancer.      DIAGNOSIS: The encounter diagnosis was Prostate cancer ([place]).   PREVIOUS INVESTIGATIONS:  PET CT scan reviewed Pathology reports reviewed Clinical notes reviewed  HPI: patient is a 67 year old male who originally presented with a PSA of 7.5 prompting transrectal ultrasound-guided biopsy showing 8 of 12 cores positive for adenocarcinoma a mixture of Gleason 7 (3+4) and Gleason 6 (3+3). Patient went on to [facility] radical robotic prostatectomy ith pelvic lymph node sampling. Surgical pathology was consistent with Gleason 7 (4+3) adenocarcinoma with extraprostatic extension but negative margins. One of 4 lymph nodes from the right pelvic lymph node sampling was positive for metastatic disease. Postoperatively he had an PET CT scan showing no evidence of prostate cancer within the prostatic bedor any residual activity in pelvic lymph nodes. He also had no evidence of distant spread or skeletal metastasis. Patient is now incontinent. He was started on Lupron although cannot tolerated based on the hot flash profile. Patient also has erectile dysfunction.his most recent PSA is less than 0.01. Based on his poor prognostic factors including lymph node involvement and extraprostatic extension he is now referred to radiation oncology for opinion. He's having no bone pain at this time urinary incontinence and erectile dysfunction as described above are present.  PLANNED TREATMENT REGIMEN: salvage  radiation therapy  PAST MEDICAL HISTORY:  has a past medical history of Cancer ([place]) (10/2017), DDD (degenerative disc disease), lumbar (06/13/2017), Elevated PSA (09/12/2017), Facet hypertrophy of lumbar region (06/13/2017), and Hyperlipidemia.    PAST SURGICAL HISTORY:  Past Surgical History:  Procedure Laterality Date  . CATARACT EXTRACTION W/ INTRAOCULAR LENS IMPLANT Left 3/ 28/2014   eyes  . CATARACT EXTRACTION W/ INTRAOCULAR LENS IMPLANT Right 04/23/2013  . PELVIC LYMPH NODE DISSECTION N/A 12/10/2017   Procedure: PELVIC LYMPH NODE DISSECTION;  Surgeon: [name], MD;  Location: [facility] ORS;  Service: Urology;  Laterality: N/A;  . ROBOT ASSISTED LAPAROSCOPIC RADICAL PROSTATECTOMY N/A 12/10/2017   Procedure: ROBOTIC ASSISTED LAPAROSCOPIC RADICAL PROSTATECTOMY;  Surgeon: [name], MD;  Location: [facility] ORS;  Service: Urology;  Laterality: N/A;    FAMILY HISTORY: family history includes Cancer in his sister and son; Prostate cancer in his son.  SOCIAL HISTORY:  reports that he has been smoking cigarettes.  He has never used smokeless tobacco. He reports that he drinks about 1.2 oz of alcohol per week. He reports that he does not use drugs.  ALLERGIES: Aspirin and Penicillins  MEDICATIONS:  Current Outpatient Medications  Medication Sig Dispense Refill  . acetaminophen (TYLENOL) 500 MG tablet Take 1,000 mg by mouth every 6 (six) hours as needed for moderate pain or headache.     . docusate sodium (COLACE) 100 MG capsule Take 1 capsule (100 mg total) by mouth 2 (two) times daily. 60 capsule 0  . ibuprofen (ADVIL,MOTRIN) 200 MG tablet Take 400 mg by mouth every 6 (six) hours as needed for headache or moderate pain.    . sildenafil (REVATIO) 20 MG  tablet Take 1 tablet (20 mg total) by mouth as needed. Take 1-5 tabs as needed prior to intercourse (Patient not taking: Reported on 05/24/2018) 30 tablet 11   No current facility-administered medications for this encounter.     ECOG  PERFORMANCE STATUS:  0 - Asymptomatic  REVIEW OF SYSTEMS: except for the urinary incontinence and erectile dysfunction Patient denies any weight loss, fatigue, weakness, fever, chills or night sweats. Patient denies any loss of vision, blurred vision. Patient denies any ringing  of the ears or hearing loss. No irregular heartbeat. Patient denies heart murmur or history of fainting. Patient denies any chest pain or pain radiating to her upper extremities. Patient denies any shortness of breath, difficulty breathing at night, cough or hemoptysis. Patient denies any swelling in the lower legs. Patient denies any nausea vomiting, vomiting of blood, or coffee ground material in the vomitus. Patient denies any stomach pain. Patient states has had normal bowel movements no significant constipation or diarrhea. Patient denies any dysuria, hematuria or significant nocturia. Patient denies any problems walking, swelling in the joints or loss of balance. Patient denies any skin changes, loss of hair or loss of weight. Patient denies any excessive worrying or anxiety or significant depression. Patient denies any problems with insomnia. Patient denies excessive thirst, polyuria, polydipsia. Patient denies any swollen glands, patient denies easy bruising or easy bleeding. Patient denies any recent infections, allergies or URI. Patient "s visual fields have not changed significantly in recent time.    PHYSICAL EXAM: BP 115/70   Pulse 76   Temp (!) 95.9 F (35.5 C)   Resp 18   Ht 5\' 8"  (1.727 m)   Wt 142 lb 15.5 oz (64.8 kg)   BMI 21.74 kg/m  On rectal exam rectal sphincter tone is good prostatic fossa is clear without evidence of nodularity or mass. Well-developed well-nourished patient in NAD. HEENT reveals PERLA, EOMI, discs not visualized.  Oral cavity is clear. No oral mucosal lesions are identified. Neck is clear without evidence of cervical or supraclavicular adenopathy. Lungs are clear to A&P. Cardiac  examination is essentially unremarkable with regular rate and rhythm without murmur rub or thrill. Abdomen is benign with no organomegaly or masses noted. Motor sensory and DTR levels are equal and symmetric in the upper and lower extremities. Cranial nerves II through XII are grossly intact. Proprioception is intact. No peripheral adenopathy or edema is identified. No motor or sensory levels are noted. Crude visual fields are within normal range.  LABORATORY DATA: pathology reports reviewed    RADIOLOGY RESULTS:PET CT scan reviewed and compatible above-stated findings   IMPRESSION: stage IV pathologic staging T3 N1 M0 Gleason 7 (+3) adenocarcinoma the prostate status post radical robotic-assisted prostatectomy in 67 year old male  PLAN: at this time I have recommended salvage radiation therapy to both his prostatic fossa as well as pelvic lymph nodes. Would use I MRT radiation therapy to treat both his prostatic fossa up to 7600 cGy in his pelvic lymph nodes up to 5400 cGy using I MRT dose painting technique. Risks and benefits of treatment including increased lower urinary tract symptoms such as urgency frequency possible diarrhea fatigue alteration of blood counts skin reaction all were discussed in detail with the patient. I go to a slightly higher dose based on years clinical experience using I am RT treating this area with excellent side effect profile. Patient seems to comprehend my treatment plan well. I have personally set up and ordered CT simulation for next week.  I  would like to take this opportunity to thank you for allowing me to participate in the care of your patient.Noreene Filbert, MD

## 2018-06-04 ENCOUNTER — Ambulatory Visit: Payer: Medicare HMO

## 2018-06-05 ENCOUNTER — Ambulatory Visit: Payer: Medicare HMO

## 2018-06-05 ENCOUNTER — Ambulatory Visit: Payer: Medicare HMO | Admitting: Urology

## 2018-06-06 ENCOUNTER — Ambulatory Visit
Admission: RE | Admit: 2018-06-06 | Discharge: 2018-06-06 | Disposition: A | Discharge status: Home / Self Care | Payer: Medicare HMO | Source: Ambulatory Visit | Attending: Radiation Oncology | Admitting: Radiation Oncology

## 2018-06-06 DIAGNOSIS — Z801 Family history of malignant neoplasm of trachea, bronchus and lung: Secondary | ICD-10-CM | POA: Insufficient documentation

## 2018-06-06 DIAGNOSIS — Z823 Family history of stroke: Secondary | ICD-10-CM | POA: Diagnosis not present

## 2018-06-06 DIAGNOSIS — Z803 Family history of malignant neoplasm of breast: Secondary | ICD-10-CM | POA: Insufficient documentation

## 2018-06-06 DIAGNOSIS — C61 Malignant neoplasm of prostate: Secondary | ICD-10-CM | POA: Insufficient documentation

## 2018-06-06 DIAGNOSIS — Z8052 Family history of malignant neoplasm of bladder: Secondary | ICD-10-CM | POA: Insufficient documentation

## 2018-06-06 DIAGNOSIS — Z510 Encounter for antineoplastic radiation therapy: Secondary | ICD-10-CM | POA: Insufficient documentation

## 2018-06-06 DIAGNOSIS — Z79899 Other long term (current) drug therapy: Secondary | ICD-10-CM | POA: Diagnosis not present

## 2018-06-06 DIAGNOSIS — Z8249 Family history of ischemic heart disease and other diseases of the circulatory system: Secondary | ICD-10-CM | POA: Diagnosis not present

## 2018-06-06 DIAGNOSIS — E7800 Pure hypercholesterolemia, unspecified: Secondary | ICD-10-CM | POA: Insufficient documentation

## 2018-06-06 DIAGNOSIS — Z8051 Family history of malignant neoplasm of kidney: Secondary | ICD-10-CM | POA: Diagnosis not present

## 2018-06-06 DIAGNOSIS — Z8042 Family history of malignant neoplasm of prostate: Secondary | ICD-10-CM | POA: Insufficient documentation

## 2018-06-06 DIAGNOSIS — F17210 Nicotine dependence, cigarettes, uncomplicated: Secondary | ICD-10-CM | POA: Diagnosis not present

## 2018-06-07 ENCOUNTER — Ambulatory Visit: Payer: Medicare HMO | Admitting: Urology

## 2018-06-07 ENCOUNTER — Encounter: Payer: Self-pay | Admitting: Family Medicine

## 2018-06-07 ENCOUNTER — Ambulatory Visit (INDEPENDENT_AMBULATORY_CARE_PROVIDER_SITE_OTHER): Payer: Medicare HMO

## 2018-06-07 VITALS — BP 110/68 | HR 60 | Temp 97.5°F | Resp 12 | Ht 67.0 in | Wt 140.6 lb

## 2018-06-07 DIAGNOSIS — C61 Malignant neoplasm of prostate: Secondary | ICD-10-CM | POA: Diagnosis not present

## 2018-06-07 DIAGNOSIS — Z0000 Encounter for general adult medical examination without abnormal findings: Secondary | ICD-10-CM | POA: Diagnosis not present

## 2018-06-07 DIAGNOSIS — Z9229 Personal history of other drug therapy: Secondary | ICD-10-CM | POA: Diagnosis not present

## 2018-06-07 DIAGNOSIS — Z1211 Encounter for screening for malignant neoplasm of colon: Secondary | ICD-10-CM | POA: Diagnosis not present

## 2018-06-07 NOTE — Patient Instructions (Addendum)
Jerry Pena , Thank you for taking time to come for your Medicare Wellness Visit. I appreciate your ongoing commitment to your health goals. Please review the following plan we discussed and let me know if I can assist you in the future.   Screening recommendations/referrals: Colorectal Screening: You will receive a call from our office Lung Cancer Screening: You do not qualify for this screening Hepatitis C Screening: Up to date Bone Density: Please call to schedule your appointment  Vision and Dental Exams: Recommended annual ophthalmology exams for early detection of glaucoma and other disorders of the eye Recommended annual dental exams for proper oral hygiene  Vaccinations: Influenza vaccine: Up to date Pneumococcal vaccine: Up to date Tdap vaccine: Up to date Shingles vaccine: Please call your insurance company to determine your out of pocket expense for the Shingrix vaccine. You may also receive this vaccine at your local pharmacy or Health Dept.  Advanced directives: Please bring a copy of your POA (Power of Attorney) and/or Living Will to your next appointment.  Conditions/risks identified: Recommend to drink at least 6-8 8oz glasses of water per day.  Next appointment: Please schedule your Annual Wellness Visit with your Nurse Health Advisor in one year.  Preventive Care 1 Years and Older, Male Preventive care refers to lifestyle choices and visits with your health care provider that can promote health and wellness. What does preventive care include?  A yearly physical exam. This is also called an annual well check.  Dental exams once or twice a year.  Routine eye exams. Ask your health care provider how often you should have your eyes checked.  Personal lifestyle choices, including:  Daily care of your teeth and gums.  Regular physical activity.  Eating a healthy diet.  Avoiding tobacco and drug use.  Limiting alcohol use.  Practicing safe sex.  Taking low  doses of aspirin every day.  Taking vitamin and mineral supplements as recommended by your health care provider. What happens during an annual well check? The services and screenings done by your health care provider during your annual well check will depend on your age, overall health, lifestyle risk factors, and family history of disease. Counseling  Your health care provider may ask you questions about your:  Alcohol use.  Tobacco use.  Drug use.  Emotional well-being.  Home and relationship well-being.  Sexual activity.  Eating habits.  History of falls.  Memory and ability to understand (cognition).  Work and work Statistician. Screening  You may have the following tests or measurements:  Height, weight, and BMI.  Blood pressure.  Lipid and cholesterol levels. These may be checked every 5 years, or more frequently if you are over 64 years old.  Skin check.  Lung cancer screening. You may have this screening every year starting at age 54 if you have a 30-pack-year history of smoking and currently smoke or have quit within the past 15 years.  Fecal occult blood test (FOBT) of the stool. You may have this test every year starting at age 39.  Flexible sigmoidoscopy or colonoscopy. You may have a sigmoidoscopy every 5 years or a colonoscopy every 10 years starting at age 45.  Prostate cancer screening. Recommendations will vary depending on your family history and other risks.  Hepatitis C blood test.  Hepatitis B blood test.  Sexually transmitted disease (STD) testing.  Diabetes screening. This is done by checking your blood sugar (glucose) after you have not eaten for a while (fasting). You may have  this done every 1-3 years.  Abdominal aortic aneurysm (AAA) screening. You may need this if you are a current or former smoker.  Osteoporosis. You may be screened starting at age 41 if you are at high risk. Talk with your health care provider about your test  results, treatment options, and if necessary, the need for more tests. Vaccines  Your health care provider may recommend certain vaccines, such as:  Influenza vaccine. This is recommended every year.  Tetanus, diphtheria, and acellular pertussis (Tdap, Td) vaccine. You may need a Td booster every 10 years.  Zoster vaccine. You may need this after age 88.  Pneumococcal 13-valent conjugate (PCV13) vaccine. One dose is recommended after age 15.  Pneumococcal polysaccharide (PPSV23) vaccine. One dose is recommended after age 72. Talk to your health care provider about which screenings and vaccines you need and how often you need them. This information is not intended to replace advice given to you by your health care provider. Make sure you discuss any questions you have with your health care provider. Document Released: 01/07/2016 Document Revised: 08/30/2016 Document Reviewed: 10/12/2015 Elsevier Interactive Patient Education  2017 [place] Prevention in the Home Falls can cause injuries. They can happen to people of all ages. There are many things you can do to make your home safe and to help prevent falls. What can I do on the outside of my home?  Regularly fix the edges of walkways and driveways and fix any cracks.  Remove anything that might make you trip as you walk through a door, such as a raised step or threshold.  Trim any bushes or trees on the path to your home.  Use bright outdoor lighting.  Clear any walking paths of anything that might make someone trip, such as rocks or tools.  Regularly check to see if handrails are loose or broken. Make sure that both sides of any steps have handrails.  Any raised decks and porches should have guardrails on the edges.  Have any leaves, snow, or ice cleared regularly.  Use sand or salt on walking paths during winter.  Clean up any spills in your garage right away. This includes oil or grease spills. What can I do in  the bathroom?  Use night lights.  Install grab bars by the toilet and in the tub and shower. Do not use towel bars as grab bars.  Use non-skid mats or decals in the tub or shower.  If you need to sit down in the shower, use a plastic, non-slip stool.  Keep the floor dry. Clean up any water that spills on the floor as soon as it happens.  Remove soap buildup in the tub or shower regularly.  Attach bath mats securely with double-sided non-slip rug tape.  Do not have throw rugs and other things on the floor that can make you trip. What can I do in the bedroom?  Use night lights.  Make sure that you have a light by your bed that is easy to reach.  Do not use any sheets or blankets that are too big for your bed. They should not hang down onto the floor.  Have a firm chair that has side arms. You can use this for support while you get dressed.  Do not have throw rugs and other things on the floor that can make you trip. What can I do in the kitchen?  Clean up any spills right away.  Avoid walking on wet floors.  Keep items that you use a lot in easy-to-reach places.  If you need to reach something above you, use a strong step stool that has a grab bar.  Keep electrical cords out of the way.  Do not use floor polish or wax that makes floors slippery. If you must use wax, use non-skid floor wax.  Do not have throw rugs and other things on the floor that can make you trip. What can I do with my stairs?  Do not leave any items on the stairs.  Make sure that there are handrails on both sides of the stairs and use them. Fix handrails that are broken or loose. Make sure that handrails are as long as the stairways.  Check any carpeting to make sure that it is firmly attached to the stairs. Fix any carpet that is loose or worn.  Avoid having throw rugs at the top or bottom of the stairs. If you do have throw rugs, attach them to the floor with carpet tape.  Make sure that you  have a light switch at the top of the stairs and the bottom of the stairs. If you do not have them, ask someone to add them for you. What else can I do to help prevent falls?  Wear shoes that:  Do not have high heels.  Have rubber bottoms.  Are comfortable and fit you well.  Are closed at the toe. Do not wear sandals.  If you use a stepladder:  Make sure that it is fully opened. Do not climb a closed stepladder.  Make sure that both sides of the stepladder are locked into place.  Ask someone to hold it for you, if possible.  Clearly mark and make sure that you can see:  Any grab bars or handrails.  First and last steps.  Where the edge of each step is.  Use tools that help you move around (mobility aids) if they are needed. These include:  Canes.  Walkers.  Scooters.  Crutches.  Turn on the lights when you go into a dark area. Replace any light bulbs as soon as they burn out.  Set up your furniture so you have a clear path. Avoid moving your furniture around.  If any of your floors are uneven, fix them.  If there are any pets around you, be aware of where they are.  Review your medicines with your doctor. Some medicines can make you feel dizzy. This can increase your chance of falling. Ask your doctor what other things that you can do to help prevent falls. This information is not intended to replace advice given to you by your health care provider. Make sure you discuss any questions you have with your health care provider. Document Released: 10/07/2009 Document Revised: 05/18/2016 Document Reviewed: 01/15/2015 Elsevier Interactive Patient Education  2017 [employer].

## 2018-06-07 NOTE — Progress Notes (Signed)
Subjective:   Jerry Pena is a 67 y.o. male who presents for an Initial Medicare Annual Wellness Visit.  Review of Systems  N/A Cardiac Risk Factors include: smoking/ tobacco exposure;advanced age (>34men, >41 women);dyslipidemia;male gender    Objective:    Today's Vitals   06/07/18 0753  BP: 110/68  Pulse: 60  Resp: 12  Temp: (!) 97.5 F (36.4 C)  TempSrc: Oral  SpO2: 97%  Weight: 140 lb 9.6 oz (63.8 kg)  Height: 5\' 7"  (1.702 m)   Body mass index is 22.02 kg/m.  Advanced Directives 06/07/2018 05/30/2018 12/10/2017 11/23/2017 11/23/2017 06/13/2017  Does Patient Have a Medical Advance Directive? Yes Yes Yes Yes Yes Yes  Type of Paramedic of [place];Living will [place];Living will [place];Living will Living will;Healthcare Power of Attorney - -  Does patient want to make changes to medical advance directive? - No - Patient declined No - Patient declined No - Patient declined - -  Copy of [place] in Chart? No - copy requested No - copy requested No - copy requested No - copy requested - -    Current Medications (verified) Outpatient Encounter Medications as of 06/07/2018  Medication Sig  . acetaminophen (TYLENOL) 500 MG tablet Take 1,000 mg by mouth every 6 (six) hours as needed for moderate pain or headache.   . docusate sodium (COLACE) 100 MG capsule Take 1 capsule (100 mg total) by mouth 2 (two) times daily.  Marland Kitchen ibuprofen (ADVIL,MOTRIN) 200 MG tablet Take 400 mg by mouth every 6 (six) hours as needed for headache or moderate pain.  . sildenafil (REVATIO) 20 MG tablet Take 1 tablet (20 mg total) by mouth as needed. Take 1-5 tabs as needed prior to intercourse (Patient not taking: Reported on 05/24/2018)   No facility-administered encounter medications on file as of 06/07/2018.     Allergies (verified) Aspirin and Penicillins   History: Past Medical History:  Diagnosis Date  . Cancer  ([place]) 10/2017   Prostate  . DDD (degenerative disc disease), lumbar 06/13/2017   Lumbar imaging June 2018  . Elevated PSA 09/12/2017   Refer to urologist, Sept 2018  . Facet hypertrophy of lumbar region 06/13/2017   Lumbar imaging June 2018  . Hyperlipidemia    Past Surgical History:  Procedure Laterality Date  . CATARACT EXTRACTION W/ INTRAOCULAR LENS IMPLANT Left 3/ 28/2014   eyes  . CATARACT EXTRACTION W/ INTRAOCULAR LENS IMPLANT Right 04/23/2013  . PELVIC LYMPH NODE DISSECTION N/A 12/10/2017   Procedure: PELVIC LYMPH NODE DISSECTION;  Surgeon: [name], MD;  Location: [facility] ORS;  Service: Urology;  Laterality: N/A;  . ROBOT ASSISTED LAPAROSCOPIC RADICAL PROSTATECTOMY N/A 12/10/2017   Procedure: ROBOTIC ASSISTED LAPAROSCOPIC RADICAL PROSTATECTOMY;  Surgeon: [name], MD;  Location: [facility] ORS;  Service: Urology;  Laterality: N/A;   Family History  Problem Relation Age of Onset  . Stroke Mother   . Stroke Father   . Cancer Sister        breast, lung  . Pulmonary Hypertension Sister   . Cancer Son        prostate  . Prostate cancer Son   . Hypertension Sister   . Bladder Cancer Neg Hx   . Kidney cancer Neg Hx    Social History   Socioeconomic History  . Marital status: Divorced    Spouse name: Not on file  . Number of children: 4  . Years of education: Not on file  .  Highest education level: Associate degree: academic program  Occupational History  . Occupation: Retired  Scientific laboratory technician  . Financial resource strain: Not hard at all  . Food insecurity:    Worry: Never true    Inability: Never true  . Transportation needs:    Medical: No    Non-medical: No  Tobacco Use  . Smoking status: Light Tobacco Smoker    Years: 48.00    Types: Cigars  . Smokeless tobacco: Never Used  Substance and Sexual Activity  . Alcohol use: Yes    Alcohol/week: 0.0 oz    Comment: rare  . Drug use: No  . Sexual activity: Not Currently  Lifestyle  . Physical activity:     Days per week: 7 days    Minutes per session: 120 min  . Stress: Not at all  Relationships  . Social connections:    Talks on phone: Patient refused    Gets together: Patient refused    Attends religious service: Patient refused    Active member of club or organization: Patient refused    Attends meetings of clubs or organizations: Patient refused    Relationship status: Divorced  Other Topics Concern  . Not on file  Social History Narrative  . Not on file   Tobacco Counseling Ready to quit: No Counseling given: Yes  Clinical Intake:  Pre-visit preparation completed: Yes  Pain : No/denies pain   BMI - recorded: 22.02 Nutritional Status: BMI of 19-24  Normal Nutritional Risks: None Diabetes: No  How often do you need to have someone help you when you read instructions, pamphlets, or other written materials from your doctor or pharmacy?: 1 - Never  Interpreter Needed?: No  Information entered by :: AEversole, LPN  Activities of Daily Living In your present state of health, do you have any difficulty performing the following activities: 06/07/2018 12/10/2017  Hearing? N N  Comment denies hearing aids -  Vision? N N  Comment denies eyeglasses -  Difficulty concentrating or making decisions? N N  Walking or climbing stairs? Y N  Comment joint pain -  Dressing or bathing? N N  Doing errands, shopping? N N  Preparing Food and eating ? N -  Comment denies dentures -  Using the Toilet? N -  In the past six months, have you accidently leaked urine? Y -  Comment r/t prostate surgery -  Do you have problems with loss of bowel control? N -  Managing your Medications? N -  Managing your Finances? N -  Housekeeping or managing your Housekeeping? N -  Some recent data might be hidden     Immunizations and Health Maintenance Immunization History  Administered Date(s) Administered  . Influenza, High Dose Seasonal PF 09/03/2017  . Influenza,inj,Quad PF,6+ Mos 09/09/2015    . Pneumococcal Conjugate-13 06/13/2017  . Pneumococcal Polysaccharide-23 06/24/2014  . Td 06/24/2014   Health Maintenance Due  Topic Date Due  . COLONOSCOPY  10/26/2015    Patient Care Team: Arnetha Courser, MD as PCP - General (Family Medicine) [name], MD as Consulting Physician (Urology) Noreene Filbert, MD as Consulting Physician (Radiation Oncology)  Indicate any recent Medical Services you may have received from other than [facility] providers in the past year (date may be approximate).    Assessment:   This is a routine wellness examination for [place].  Hearing/Vision screen Vision Screening Comments: No established with a provider for annual eye exams  Dietary issues and exercise activities discussed: Current Exercise Habits:  Home exercise routine, Type of exercise: strength training/weights;walking, Time (Minutes): 60, Frequency (Times/Week): 7, Weekly Exercise (Minutes/Week): 420, Intensity: Mild, Exercise limited by: None identified  Goals    . DIET - INCREASE WATER INTAKE     Recommend to drink at least 6-8 8oz glasses of water per day.      Depression Screen PHQ 2/9 Scores 06/07/2018 05/30/2018 06/13/2017 09/09/2015  PHQ - 2 Score 0 0 0 0  PHQ- 9 Score 0 - - -    Fall Risk Fall Risk  06/07/2018 05/30/2018 06/13/2017 09/09/2015  Falls in the past year? No No No No  Risk for fall due to : Other (Comment) - - -  Risk for fall due to: Comment sciatica - - -    FALL RISK PREVENTION PERTAINING TO HOME: Is your home free of loose throw rugs in walkways, pet beds, electrical cords, etc? Yes Is there adequate lighting in your home to reduce risk of falls?  Yes Are there stairs in or around your home WITH handrails? Yes  ASSISTIVE DEVICES UTILIZED TO PREVENT FALLS: Use of a cane, walker or w/c? No Grab bars in the bathroom? Yes  Shower chair or a place to sit while bathing? No An elevated toilet seat or a handicapped toilet? No  Timed Get Up and Go Performed: Yes. Pt  ambulated 10 feet within 7 sec. Gait stead-fast and without the use of an assistive device. No intervention required at this time. Fall risk prevention has been discussed.  Community Resource Referral:  Pt declined my offer to send [employer] Referral to Care Guide for a shower chair or an elevated toilet seat.  Cognitive Function:     6CIT Screen 06/07/2018  What Year? 0 points  What month? 0 points  What time? 0 points  Count back from 20 0 points  Months in reverse 0 points  Repeat phrase 8 points  Total Score 8    Screening Tests Health Maintenance  Topic Date Due  . COLONOSCOPY  10/26/2015  . INFLUENZA VACCINE  07/25/2018  . PNA vac Low Risk Adult (2 of 2 - PPSV23) 06/25/2019  . TETANUS/TDAP  06/24/2024  . Hepatitis C Screening  Completed    Qualifies for Shingles Vaccine? Yes. Due for Shingrix. Education has been provided regarding the importance of this vaccine. Pt has been advised to call his insurance company to determine his out of pocket expense. Advised he may also receive this vaccine at his local pharmacy or Health Dept. Verbalized acceptance and understanding.  Cancer Screenings: Lung: Low Dose CT Chest recommended if Age 80-80 years, 30 pack-year currently smoking OR have quit w/in 15years. Patient does not qualify. Colorectal: Referral placed for pt to be scheduled for screening colonoscopy. Will undergo radiation txt's x6 weeks beginning 06/17/18. Will wait to proceed with scheduling colonoscopy until radiation txt's are complete and rad onc has cleared pt to proceed. Bone Density/DEXA: Ordered today. Pt provided with contact information and advised to schedule appt.  Additional Screenings: Hepatitis C Screening: Completed 09/03/17    Plan:  I have personally reviewed and addressed the Medicare Annual Wellness questionnaire and have noted the following in the patient's chart:  A. Medical and social history B. Use of alcohol, tobacco or illicit drugs    C. Current medications and supplements D. Functional ability and status E.  Nutritional status F.  Physical activity G. Advance directives H. List of other physicians I.  Hospitalizations, surgeries, and ER visits in previous 12 months J.  Vitals K. Screenings such as hearing and vision if needed, cognitive and depression L. Referrals and appointments  In addition, I have reviewed and discussed with patient certain preventive protocols, quality metrics, and best practice recommendations. A written personalized care plan for preventive services as well as general preventive health recommendations were provided to patient.  See attached scanned questionnaire for additional information.   Signed,  Aleatha Borer, LPN Nurse Health Advisor

## 2018-06-13 DIAGNOSIS — Z510 Encounter for antineoplastic radiation therapy: Secondary | ICD-10-CM | POA: Diagnosis not present

## 2018-06-13 DIAGNOSIS — Z801 Family history of malignant neoplasm of trachea, bronchus and lung: Secondary | ICD-10-CM | POA: Diagnosis not present

## 2018-06-13 DIAGNOSIS — C61 Malignant neoplasm of prostate: Secondary | ICD-10-CM | POA: Diagnosis not present

## 2018-06-13 DIAGNOSIS — F17210 Nicotine dependence, cigarettes, uncomplicated: Secondary | ICD-10-CM | POA: Diagnosis not present

## 2018-06-13 DIAGNOSIS — Z8051 Family history of malignant neoplasm of kidney: Secondary | ICD-10-CM | POA: Diagnosis not present

## 2018-06-13 DIAGNOSIS — Z803 Family history of malignant neoplasm of breast: Secondary | ICD-10-CM | POA: Diagnosis not present

## 2018-06-13 DIAGNOSIS — Z8052 Family history of malignant neoplasm of bladder: Secondary | ICD-10-CM | POA: Diagnosis not present

## 2018-06-13 DIAGNOSIS — Z8042 Family history of malignant neoplasm of prostate: Secondary | ICD-10-CM | POA: Diagnosis not present

## 2018-06-13 DIAGNOSIS — E7800 Pure hypercholesterolemia, unspecified: Secondary | ICD-10-CM | POA: Diagnosis not present

## 2018-06-14 ENCOUNTER — Ambulatory Visit (INDEPENDENT_AMBULATORY_CARE_PROVIDER_SITE_OTHER): Payer: Medicare HMO | Admitting: Family Medicine

## 2018-06-14 ENCOUNTER — Encounter: Payer: Self-pay | Admitting: Family Medicine

## 2018-06-14 ENCOUNTER — Other Ambulatory Visit: Payer: Self-pay | Admitting: *Deleted

## 2018-06-14 VITALS — BP 104/58 | HR 89 | Temp 97.5°F | Resp 14 | Ht 67.0 in | Wt 141.9 lb

## 2018-06-14 DIAGNOSIS — Z5181 Encounter for therapeutic drug level monitoring: Secondary | ICD-10-CM

## 2018-06-14 DIAGNOSIS — D649 Anemia, unspecified: Secondary | ICD-10-CM

## 2018-06-14 DIAGNOSIS — E782 Mixed hyperlipidemia: Secondary | ICD-10-CM | POA: Diagnosis not present

## 2018-06-14 DIAGNOSIS — C61 Malignant neoplasm of prostate: Secondary | ICD-10-CM | POA: Diagnosis not present

## 2018-06-14 DIAGNOSIS — Z720 Tobacco use: Secondary | ICD-10-CM

## 2018-06-14 DIAGNOSIS — M47816 Spondylosis without myelopathy or radiculopathy, lumbar region: Secondary | ICD-10-CM | POA: Diagnosis not present

## 2018-06-14 LAB — CBC WITH DIFFERENTIAL/PLATELET
BASOS ABS: 69 {cells}/uL (ref 0–200)
Basophils Relative: 1.3 %
EOS ABS: 254 {cells}/uL (ref 15–500)
Eosinophils Relative: 4.8 %
HCT: 33.9 % — ABNORMAL LOW (ref 38.5–50.0)
HEMOGLOBIN: 11.2 g/dL — AB (ref 13.2–17.1)
Lymphs Abs: 2268 cells/uL (ref 850–3900)
MCH: 22.8 pg — AB (ref 27.0–33.0)
MCHC: 33 g/dL (ref 32.0–36.0)
MCV: 69 fL — AB (ref 80.0–100.0)
MPV: 10.2 fL (ref 7.5–12.5)
Monocytes Relative: 11.4 %
NEUTROS ABS: 2104 {cells}/uL (ref 1500–7800)
Neutrophils Relative %: 39.7 %
Platelets: 369 10*3/uL (ref 140–400)
RBC: 4.91 10*6/uL (ref 4.20–5.80)
RDW: 17.4 % — ABNORMAL HIGH (ref 11.0–15.0)
Total Lymphocyte: 42.8 %
WBC: 5.3 10*3/uL (ref 3.8–10.8)
WBCMIX: 604 {cells}/uL (ref 200–950)

## 2018-06-14 LAB — LIPID PANEL
CHOL/HDL RATIO: 3.1 (calc) (ref <5.0)
Cholesterol: 168 mg/dL (ref <200)
HDL: 54 mg/dL (ref >40)
LDL CHOLESTEROL (CALC): 86 mg/dL
Non-HDL Cholesterol (Calc): 114 mg/dL (calc) (ref <130)
TRIGLYCERIDES: 184 mg/dL — AB (ref <150)

## 2018-06-14 LAB — BASIC METABOLIC PANEL
BUN: 18 mg/dL (ref 7–25)
CO2: 31 mmol/L (ref 20–32)
CREATININE: 0.95 mg/dL (ref 0.70–1.25)
Calcium: 9.8 mg/dL (ref 8.6–10.3)
Chloride: 103 mmol/L (ref 98–110)
GLUCOSE: 91 mg/dL (ref 65–139)
Potassium: 4.4 mmol/L (ref 3.5–5.3)
Sodium: 139 mmol/L (ref 135–146)

## 2018-06-14 LAB — IRON,TIBC AND FERRITIN PANEL
%SAT: 28 % (calc) (ref 20–48)
FERRITIN: 272 ng/mL (ref 24–380)
Iron: 83 ug/dL (ref 50–180)
TIBC: 294 mcg/dL (calc) (ref 250–425)

## 2018-06-14 LAB — CBC MORPHOLOGY

## 2018-06-14 NOTE — Patient Instructions (Addendum)
I do encourage you to quit smoking Call [phone] to sign up for smoking cessation classes You can call 1-800-QUIT-NOW to talk with a smoking cessation coach Try to limit saturated fats in your diet (bologna, hot dogs, barbeque, cheeseburgers, hamburgers, steak, bacon, sausage, cheese, etc.) and get more fresh fruits, vegetables, and whole grains  Steps to Quit Smoking Smoking tobacco can be bad for your health. It can also affect almost every organ in your body. Smoking puts you and people around you at risk for many serious long-lasting (chronic) diseases. Quitting smoking is hard, but it is one of the best things that you can do for your health. It is never too late to quit. What are the benefits of quitting smoking? When you quit smoking, you lower your risk for getting serious diseases and conditions. They can include:  Lung cancer or lung disease.  Heart disease.  Stroke.  Heart attack.  Not being able to have children (infertility).  Weak bones (osteoporosis) and broken bones (fractures).  If you have coughing, wheezing, and shortness of breath, those symptoms may get better when you quit. You may also get sick less often. If you are pregnant, quitting smoking can help to lower your chances of having a baby of low birth weight. What can I do to help me quit smoking? Talk with your doctor about what can help you quit smoking. Some things you can do (strategies) include:  Quitting smoking totally, instead of slowly cutting back how much you smoke over a period of time.  Going to in-person counseling. You are more likely to quit if you go to many counseling sessions.  Using resources and support systems, such as: ? Database administrator with a Social worker. ? Phone quitlines. ? Careers information officer. ? Support groups or group counseling. ? Text messaging programs. ? Mobile phone apps or applications.  Taking medicines. Some of these medicines may have nicotine in them. If you  are pregnant or breastfeeding, do not take any medicines to quit smoking unless your doctor says it is okay. Talk with your doctor about counseling or other things that can help you.  Talk with your doctor about using more than one strategy at the same time, such as taking medicines while you are also going to in-person counseling. This can help make quitting easier. What things can I do to make it easier to quit? Quitting smoking might feel very hard at first, but there is a lot that you can do to make it easier. Take these steps:  Talk to your family and friends. Ask them to support and encourage you.  Call phone quitlines, reach out to support groups, or work with a Social worker.  Ask people who smoke to not smoke around you.  Avoid places that make you want (trigger) to smoke, such as: ? Bars. ? Parties. ? Smoke-break areas at work.  Spend time with people who do not smoke.  Lower the stress in your life. Stress can make you want to smoke. Try these things to help your stress: ? Getting regular exercise. ? Deep-breathing exercises. ? Yoga. ? Meditating. ? Doing a body scan. To do this, close your eyes, focus on one area of your body at a time from head to toe, and notice which parts of your body are tense. Try to relax the muscles in those areas.  Download or buy apps on your mobile phone or tablet that can help you stick to your quit plan. There are many free apps,  such as QuitGuide from the [employer] Office manager for Disease Control and Prevention). You can find more support from smokefree.gov and other websites.  This information is not intended to replace advice given to you by your health care provider. Make sure you discuss any questions you have with your health care provider. Document Released: 10/07/2009 Document Revised: 08/08/2016 Document Reviewed: 04/27/2015 Elsevier Interactive Patient Education  2018 [employer].

## 2018-06-14 NOTE — Assessment & Plan Note (Signed)
He'll stay active; using tylenol for flare ups; [facility]

## 2018-06-14 NOTE — Assessment & Plan Note (Signed)
He'll try to quit on his own; discussed risk of intense, odd dreams with the Chantix; he'll try the nicorette gum instead for the occasional cigarette; call if I can of any help

## 2018-06-14 NOTE — Assessment & Plan Note (Signed)
Check today; limit saturated fats 

## 2018-06-14 NOTE — Progress Notes (Signed)
BP (!) 104/58   Pulse 89   Temp (!) 97.5 F (36.4 C) (Oral)   Resp 14   Ht 5\' 7"  (1.702 m)   Wt 141 lb 14.4 oz (64.4 kg)   SpO2 96%   BMI 22.22 kg/m    Subjective:    Patient ID: Jerry Pena, male    DOB: 1951/08/31, 67 y.o.   MRN: [id number]  HPI: Jerry Pena is a 67 y.o. male  Chief Complaint  Patient presents with  . Follow-up    from [facility]    HPI Patient is undergoing treatment for prostate cancer; radiation treatment starting on Monday; 5 days a week for 8 weeks; stopped the hormone therapy, hot flashes were too much; started to stay in; mood is a little better now Last PSA <0.1 in April  Blood pressure is excellent; mother and father lived into their late 59's  Facet arthropathy; no pain lately; might flare up every 3-4 months; inactivity will actually trigger it; when it flares up, he'll take a few tylenol; heating pad will help and that eases off after 2-3 days  Smoking every now and then; he's thinking about chantix; he has been reading about that; no previous experience; he used to use nicotine patches from the [facility], but had side effects of crazy dreams; stopped using those;   High cholesterol; he had been on small dose of cholesterol medicine through the [place]; not many fatty meats; not much cheese; not many eggs Lab Results  Component Value Date   CHOL 191 09/03/2017   HDL 71 09/03/2017   LDLCALC 100 (H) 09/03/2017   TRIG 106 09/03/2017   CHOLHDL 2.7 09/03/2017   Mild anemia in December 2018; eating a lot of greens; no blood in the stool  He'll have colonoscopy as soon as finished with radiation therapy and cleared by oncologist  Bone density test: patient called them; hx of hormone therapy  Depression screen [facility] 2/9 06/14/2018 06/07/2018 05/30/2018 06/13/2017 09/09/2015  Decreased Interest 0 0 0 0 0  Down, Depressed, Hopeless 1 0 0 0 0  PHQ - 2 Score 1 0 0 0 0  Altered sleeping 1 0 - - -  Tired, decreased energy 1 0 - - -  Change in appetite 0 0 -  - -  Feeling bad or failure about yourself  0 0 - - -  Trouble concentrating 0 0 - - -  Moving slowly or fidgety/restless 0 0 - - -  Suicidal thoughts 0 0 - - -  PHQ-9 Score 3 0 - - -  Difficult doing work/chores Somewhat difficult Not difficult at all - - -    Relevant past medical, surgical, family and social history reviewed Past Medical History:  Diagnosis Date  . Cancer ([place]) 10/2017   Prostate  . DDD (degenerative disc disease), lumbar 06/13/2017   Lumbar imaging June 2018  . Elevated PSA 09/12/2017   Refer to urologist, Sept 2018  . Facet hypertrophy of lumbar region 06/13/2017   Lumbar imaging June 2018  . Hyperlipidemia    Past Surgical History:  Procedure Laterality Date  . CATARACT EXTRACTION W/ INTRAOCULAR LENS IMPLANT Left 3/ 28/2014   eyes  . CATARACT EXTRACTION W/ INTRAOCULAR LENS IMPLANT Right 04/23/2013  . PELVIC LYMPH NODE DISSECTION N/A 12/10/2017   Procedure: PELVIC LYMPH NODE DISSECTION;  Surgeon: [name], MD;  Location: [facility] ORS;  Service: Urology;  Laterality: N/A;  . ROBOT ASSISTED LAPAROSCOPIC RADICAL PROSTATECTOMY N/A 12/10/2017   Procedure: ROBOTIC  ASSISTED LAPAROSCOPIC RADICAL PROSTATECTOMY;  Surgeon: [name], MD;  Location: [facility] ORS;  Service: Urology;  Laterality: N/A;   Family History  Problem Relation Age of Onset  . Stroke Mother   . Stroke Father   . Cancer Sister        breast, lung  . Pulmonary Hypertension Sister   . Cancer Son        prostate  . Prostate cancer Son   . Hypertension Sister   . Bladder Cancer Neg Hx   . Kidney cancer Neg Hx    Social History   Tobacco Use  . Smoking status: Light Tobacco Smoker    Years: 48.00    Types: Cigars  . Smokeless tobacco: Never Used  Substance Use Topics  . Alcohol use: Yes    Alcohol/week: 0.0 oz    Comment: rare  . Drug use: No    Interim medical history since last visit reviewed. Allergies and medications reviewed  Review of Systems Per HPI unless  specifically indicated above     Objective:    BP (!) 104/58   Pulse 89   Temp (!) 97.5 F (36.4 C) (Oral)   Resp 14   Ht 5\' 7"  (1.702 m)   Wt 141 lb 14.4 oz (64.4 kg)   SpO2 96%   BMI 22.22 kg/m   Wt Readings from Last 3 Encounters:  06/14/18 141 lb 14.4 oz (64.4 kg)  06/07/18 140 lb 9.6 oz (63.8 kg)  05/30/18 142 lb 15.5 oz (64.8 kg)    Physical Exam  Constitutional: He appears well-developed and well-nourished. No distress.  HENT:  Head: Normocephalic and atraumatic.  Eyes: EOM are normal. No scleral icterus.  Neck: No thyromegaly present.  Cardiovascular: Normal rate and regular rhythm.  Pulmonary/Chest: Effort normal and breath sounds normal.  Abdominal: Soft. Bowel sounds are normal. He exhibits no distension.  Musculoskeletal: He exhibits no edema.  Neurological: Coordination normal.  Skin: Skin is warm and dry. No pallor.  Psychiatric: He has a normal mood and affect. His behavior is normal. Judgment and thought content normal.   Results for orders placed or performed in visit on 04/19/18  PSA  Result Value Ref Range   Prostate Specific Ag, Serum <0.1 0.0 - 4.0 ng/mL      Assessment & Plan:   Problem List Items Addressed This Visit      Musculoskeletal and Integument   Facet hypertrophy of lumbar region    He'll stay active; using tylenol for flare ups; [facility]        Genitourinary   Prostate cancer ([place]) - Primary    Will start radiation therapy on Monday for 8 weeks; discussed leakage; f/u with Dr. Erlene Quan        Other   Medication monitoring encounter   Relevant Orders   Basic Metabolic Panel (BMET)   Tobacco abuse    He'll try to quit on his own; discussed risk of intense, odd dreams with the Chantix; he'll try the nicorette gum instead for the occasional cigarette; call if I can of any help      Hyperlipidemia    Check today; limit saturated fats      Relevant Orders   Lipid panel      Other Visit Diagnoses    Anemia, unspecified type       Relevant Medications   vitamin B-12 (CYANOCOBALAMIN) 100 MCG tablet   Other Relevant Orders   CBC with Differential/Platelet  Fe+TIBC+Fer       Follow up plan: Return in about 6 months (around 12/14/2018) for follow-up visit with Dr. Sanda Klein.  An after-visit summary was printed and given to the patient at [place].  Please see the patient instructions which may contain other information and recommendations beyond what is mentioned above in the assessment and plan.  No orders of the defined types were placed in this encounter.   Orders Placed This Encounter  Procedures  . CBC with Differential/Platelet  . Fe+TIBC+Fer  . Basic Metabolic Panel (BMET)  . Lipid panel

## 2018-06-14 NOTE — Assessment & Plan Note (Signed)
Will start radiation therapy on Monday for 8 weeks; discussed leakage; f/u with Dr. Erlene Quan

## 2018-06-17 ENCOUNTER — Ambulatory Visit
Admission: RE | Admit: 2018-06-17 | Discharge: 2018-06-17 | Disposition: A | Discharge status: Home / Self Care | Payer: Medicare HMO | Source: Ambulatory Visit | Attending: Radiation Oncology | Admitting: Radiation Oncology

## 2018-06-18 ENCOUNTER — Ambulatory Visit
Admission: RE | Admit: 2018-06-18 | Discharge: 2018-06-18 | Disposition: A | Discharge status: Home / Self Care | Payer: Medicare HMO | Source: Ambulatory Visit | Attending: Radiation Oncology | Admitting: Radiation Oncology

## 2018-06-18 DIAGNOSIS — Z8042 Family history of malignant neoplasm of prostate: Secondary | ICD-10-CM | POA: Diagnosis not present

## 2018-06-18 DIAGNOSIS — F17210 Nicotine dependence, cigarettes, uncomplicated: Secondary | ICD-10-CM | POA: Diagnosis not present

## 2018-06-18 DIAGNOSIS — Z510 Encounter for antineoplastic radiation therapy: Secondary | ICD-10-CM | POA: Diagnosis not present

## 2018-06-18 DIAGNOSIS — Z8051 Family history of malignant neoplasm of kidney: Secondary | ICD-10-CM | POA: Diagnosis not present

## 2018-06-18 DIAGNOSIS — C61 Malignant neoplasm of prostate: Secondary | ICD-10-CM | POA: Diagnosis not present

## 2018-06-18 DIAGNOSIS — E7800 Pure hypercholesterolemia, unspecified: Secondary | ICD-10-CM | POA: Diagnosis not present

## 2018-06-18 DIAGNOSIS — Z803 Family history of malignant neoplasm of breast: Secondary | ICD-10-CM | POA: Diagnosis not present

## 2018-06-18 DIAGNOSIS — Z8052 Family history of malignant neoplasm of bladder: Secondary | ICD-10-CM | POA: Diagnosis not present

## 2018-06-18 DIAGNOSIS — Z801 Family history of malignant neoplasm of trachea, bronchus and lung: Secondary | ICD-10-CM | POA: Diagnosis not present

## 2018-06-19 ENCOUNTER — Ambulatory Visit
Admission: RE | Admit: 2018-06-19 | Discharge: 2018-06-19 | Disposition: A | Discharge status: Home / Self Care | Payer: Medicare HMO | Source: Ambulatory Visit | Attending: Radiation Oncology | Admitting: Radiation Oncology

## 2018-06-19 DIAGNOSIS — F17210 Nicotine dependence, cigarettes, uncomplicated: Secondary | ICD-10-CM | POA: Diagnosis not present

## 2018-06-19 DIAGNOSIS — Z8042 Family history of malignant neoplasm of prostate: Secondary | ICD-10-CM | POA: Diagnosis not present

## 2018-06-19 DIAGNOSIS — Z801 Family history of malignant neoplasm of trachea, bronchus and lung: Secondary | ICD-10-CM | POA: Diagnosis not present

## 2018-06-19 DIAGNOSIS — Z8051 Family history of malignant neoplasm of kidney: Secondary | ICD-10-CM | POA: Diagnosis not present

## 2018-06-19 DIAGNOSIS — E7800 Pure hypercholesterolemia, unspecified: Secondary | ICD-10-CM | POA: Diagnosis not present

## 2018-06-19 DIAGNOSIS — Z510 Encounter for antineoplastic radiation therapy: Secondary | ICD-10-CM | POA: Diagnosis not present

## 2018-06-19 DIAGNOSIS — Z8052 Family history of malignant neoplasm of bladder: Secondary | ICD-10-CM | POA: Diagnosis not present

## 2018-06-19 DIAGNOSIS — C61 Malignant neoplasm of prostate: Secondary | ICD-10-CM | POA: Diagnosis not present

## 2018-06-19 DIAGNOSIS — Z803 Family history of malignant neoplasm of breast: Secondary | ICD-10-CM | POA: Diagnosis not present

## 2018-06-20 ENCOUNTER — Ambulatory Visit
Admission: RE | Admit: 2018-06-20 | Discharge: 2018-06-20 | Disposition: A | Discharge status: Home / Self Care | Payer: Medicare HMO | Source: Ambulatory Visit | Attending: Radiation Oncology | Admitting: Radiation Oncology

## 2018-06-20 DIAGNOSIS — F17210 Nicotine dependence, cigarettes, uncomplicated: Secondary | ICD-10-CM | POA: Diagnosis not present

## 2018-06-20 DIAGNOSIS — Z8051 Family history of malignant neoplasm of kidney: Secondary | ICD-10-CM | POA: Diagnosis not present

## 2018-06-20 DIAGNOSIS — Z801 Family history of malignant neoplasm of trachea, bronchus and lung: Secondary | ICD-10-CM | POA: Diagnosis not present

## 2018-06-20 DIAGNOSIS — C61 Malignant neoplasm of prostate: Secondary | ICD-10-CM | POA: Diagnosis not present

## 2018-06-20 DIAGNOSIS — Z8042 Family history of malignant neoplasm of prostate: Secondary | ICD-10-CM | POA: Diagnosis not present

## 2018-06-20 DIAGNOSIS — Z803 Family history of malignant neoplasm of breast: Secondary | ICD-10-CM | POA: Diagnosis not present

## 2018-06-20 DIAGNOSIS — E7800 Pure hypercholesterolemia, unspecified: Secondary | ICD-10-CM | POA: Diagnosis not present

## 2018-06-20 DIAGNOSIS — Z8052 Family history of malignant neoplasm of bladder: Secondary | ICD-10-CM | POA: Diagnosis not present

## 2018-06-20 DIAGNOSIS — Z510 Encounter for antineoplastic radiation therapy: Secondary | ICD-10-CM | POA: Diagnosis not present

## 2018-06-21 ENCOUNTER — Ambulatory Visit
Admission: RE | Admit: 2018-06-21 | Discharge: 2018-06-21 | Disposition: A | Discharge status: Home / Self Care | Payer: Medicare HMO | Source: Ambulatory Visit | Attending: Radiation Oncology | Admitting: Radiation Oncology

## 2018-06-21 DIAGNOSIS — Z8051 Family history of malignant neoplasm of kidney: Secondary | ICD-10-CM | POA: Diagnosis not present

## 2018-06-21 DIAGNOSIS — E7800 Pure hypercholesterolemia, unspecified: Secondary | ICD-10-CM | POA: Diagnosis not present

## 2018-06-21 DIAGNOSIS — Z510 Encounter for antineoplastic radiation therapy: Secondary | ICD-10-CM | POA: Diagnosis not present

## 2018-06-21 DIAGNOSIS — Z803 Family history of malignant neoplasm of breast: Secondary | ICD-10-CM | POA: Diagnosis not present

## 2018-06-21 DIAGNOSIS — Z8042 Family history of malignant neoplasm of prostate: Secondary | ICD-10-CM | POA: Diagnosis not present

## 2018-06-21 DIAGNOSIS — Z801 Family history of malignant neoplasm of trachea, bronchus and lung: Secondary | ICD-10-CM | POA: Diagnosis not present

## 2018-06-21 DIAGNOSIS — Z8052 Family history of malignant neoplasm of bladder: Secondary | ICD-10-CM | POA: Diagnosis not present

## 2018-06-21 DIAGNOSIS — F17210 Nicotine dependence, cigarettes, uncomplicated: Secondary | ICD-10-CM | POA: Diagnosis not present

## 2018-06-21 DIAGNOSIS — C61 Malignant neoplasm of prostate: Secondary | ICD-10-CM | POA: Diagnosis not present

## 2018-06-24 ENCOUNTER — Ambulatory Visit
Admission: RE | Admit: 2018-06-24 | Discharge: 2018-06-24 | Disposition: A | Discharge status: Home / Self Care | Payer: Medicare HMO | Source: Ambulatory Visit | Attending: Radiation Oncology | Admitting: Radiation Oncology

## 2018-06-24 ENCOUNTER — Other Ambulatory Visit: Payer: Self-pay | Admitting: Family Medicine

## 2018-06-24 DIAGNOSIS — R718 Other abnormality of red blood cells: Secondary | ICD-10-CM

## 2018-06-24 DIAGNOSIS — C61 Malignant neoplasm of prostate: Secondary | ICD-10-CM | POA: Insufficient documentation

## 2018-06-24 DIAGNOSIS — Z510 Encounter for antineoplastic radiation therapy: Secondary | ICD-10-CM | POA: Insufficient documentation

## 2018-06-24 NOTE — Progress Notes (Signed)
Order labs.

## 2018-06-25 ENCOUNTER — Ambulatory Visit
Admission: RE | Admit: 2018-06-25 | Discharge: 2018-06-25 | Disposition: A | Discharge status: Home / Self Care | Payer: Medicare HMO | Source: Ambulatory Visit | Attending: Radiation Oncology | Admitting: Radiation Oncology

## 2018-06-25 DIAGNOSIS — C61 Malignant neoplasm of prostate: Secondary | ICD-10-CM | POA: Diagnosis not present

## 2018-06-25 DIAGNOSIS — Z510 Encounter for antineoplastic radiation therapy: Secondary | ICD-10-CM | POA: Diagnosis not present

## 2018-06-26 ENCOUNTER — Ambulatory Visit
Admission: RE | Admit: 2018-06-26 | Discharge: 2018-06-26 | Disposition: A | Discharge status: Home / Self Care | Payer: Medicare HMO | Source: Ambulatory Visit | Attending: Radiation Oncology | Admitting: Radiation Oncology

## 2018-06-26 DIAGNOSIS — C61 Malignant neoplasm of prostate: Secondary | ICD-10-CM | POA: Diagnosis not present

## 2018-06-26 DIAGNOSIS — Z510 Encounter for antineoplastic radiation therapy: Secondary | ICD-10-CM | POA: Diagnosis not present

## 2018-06-28 ENCOUNTER — Ambulatory Visit
Admission: RE | Admit: 2018-06-28 | Discharge: 2018-06-28 | Disposition: A | Discharge status: Home / Self Care | Payer: Medicare HMO | Source: Ambulatory Visit | Attending: Radiation Oncology | Admitting: Radiation Oncology

## 2018-06-28 DIAGNOSIS — C61 Malignant neoplasm of prostate: Secondary | ICD-10-CM | POA: Diagnosis not present

## 2018-06-28 DIAGNOSIS — Z510 Encounter for antineoplastic radiation therapy: Secondary | ICD-10-CM | POA: Diagnosis not present

## 2018-07-01 ENCOUNTER — Ambulatory Visit
Admission: RE | Admit: 2018-07-01 | Discharge: 2018-07-01 | Disposition: A | Discharge status: Home / Self Care | Payer: Medicare HMO | Source: Ambulatory Visit | Attending: Radiation Oncology | Admitting: Radiation Oncology

## 2018-07-01 DIAGNOSIS — C61 Malignant neoplasm of prostate: Secondary | ICD-10-CM | POA: Diagnosis not present

## 2018-07-01 DIAGNOSIS — Z510 Encounter for antineoplastic radiation therapy: Secondary | ICD-10-CM | POA: Diagnosis not present

## 2018-07-02 ENCOUNTER — Ambulatory Visit
Admission: RE | Admit: 2018-07-02 | Discharge: 2018-07-02 | Disposition: A | Discharge status: Home / Self Care | Payer: Medicare HMO | Source: Ambulatory Visit | Attending: Radiation Oncology | Admitting: Radiation Oncology

## 2018-07-02 DIAGNOSIS — Z510 Encounter for antineoplastic radiation therapy: Secondary | ICD-10-CM | POA: Diagnosis not present

## 2018-07-02 DIAGNOSIS — C61 Malignant neoplasm of prostate: Secondary | ICD-10-CM | POA: Diagnosis not present

## 2018-07-03 ENCOUNTER — Inpatient Hospital Stay: Payer: Medicare HMO | Attending: Radiation Oncology

## 2018-07-03 ENCOUNTER — Ambulatory Visit
Admission: RE | Admit: 2018-07-03 | Discharge: 2018-07-03 | Disposition: A | Discharge status: Home / Self Care | Payer: Medicare HMO | Source: Ambulatory Visit | Attending: Radiation Oncology | Admitting: Radiation Oncology

## 2018-07-03 DIAGNOSIS — C61 Malignant neoplasm of prostate: Secondary | ICD-10-CM | POA: Insufficient documentation

## 2018-07-03 DIAGNOSIS — Z510 Encounter for antineoplastic radiation therapy: Secondary | ICD-10-CM | POA: Diagnosis not present

## 2018-07-03 LAB — CBC
HEMATOCRIT: 33.4 % — AB (ref 40.0–52.0)
Hemoglobin: 11 g/dL — ABNORMAL LOW (ref 13.0–18.0)
MCH: 23.3 pg — ABNORMAL LOW (ref 26.0–34.0)
MCHC: 32.8 g/dL (ref 32.0–36.0)
MCV: 71 fL — ABNORMAL LOW (ref 80.0–100.0)
Platelets: 252 10*3/uL (ref 150–440)
RBC: 4.71 MIL/uL (ref 4.40–5.90)
RDW: 17.5 % — AB (ref 11.5–14.5)
WBC: 5.3 10*3/uL (ref 3.8–10.6)

## 2018-07-04 ENCOUNTER — Ambulatory Visit: Payer: Medicare HMO

## 2018-07-05 ENCOUNTER — Ambulatory Visit
Admission: RE | Admit: 2018-07-05 | Discharge: 2018-07-05 | Disposition: A | Discharge status: Home / Self Care | Payer: Medicare HMO | Source: Ambulatory Visit | Attending: Radiation Oncology | Admitting: Radiation Oncology

## 2018-07-05 DIAGNOSIS — C61 Malignant neoplasm of prostate: Secondary | ICD-10-CM | POA: Diagnosis not present

## 2018-07-05 DIAGNOSIS — Z510 Encounter for antineoplastic radiation therapy: Secondary | ICD-10-CM | POA: Diagnosis not present

## 2018-07-08 ENCOUNTER — Ambulatory Visit: Payer: Medicare HMO

## 2018-07-09 ENCOUNTER — Ambulatory Visit
Admission: RE | Admit: 2018-07-09 | Discharge: 2018-07-09 | Disposition: A | Discharge status: Home / Self Care | Payer: Medicare HMO | Source: Ambulatory Visit | Attending: Radiation Oncology | Admitting: Radiation Oncology

## 2018-07-09 DIAGNOSIS — Z510 Encounter for antineoplastic radiation therapy: Secondary | ICD-10-CM | POA: Diagnosis not present

## 2018-07-09 DIAGNOSIS — C61 Malignant neoplasm of prostate: Secondary | ICD-10-CM | POA: Diagnosis not present

## 2018-07-10 ENCOUNTER — Ambulatory Visit
Admission: RE | Admit: 2018-07-10 | Discharge: 2018-07-10 | Disposition: A | Discharge status: Home / Self Care | Payer: Medicare HMO | Source: Ambulatory Visit | Attending: Radiation Oncology | Admitting: Radiation Oncology

## 2018-07-10 DIAGNOSIS — Z510 Encounter for antineoplastic radiation therapy: Secondary | ICD-10-CM | POA: Diagnosis not present

## 2018-07-10 DIAGNOSIS — C61 Malignant neoplasm of prostate: Secondary | ICD-10-CM | POA: Diagnosis not present

## 2018-07-11 ENCOUNTER — Ambulatory Visit
Admission: RE | Admit: 2018-07-11 | Discharge: 2018-07-11 | Disposition: A | Discharge status: Home / Self Care | Payer: Medicare HMO | Source: Ambulatory Visit | Attending: Radiation Oncology | Admitting: Radiation Oncology

## 2018-07-11 DIAGNOSIS — Z510 Encounter for antineoplastic radiation therapy: Secondary | ICD-10-CM | POA: Diagnosis not present

## 2018-07-11 DIAGNOSIS — C61 Malignant neoplasm of prostate: Secondary | ICD-10-CM | POA: Diagnosis not present

## 2018-07-12 ENCOUNTER — Ambulatory Visit
Admission: RE | Admit: 2018-07-12 | Discharge: 2018-07-12 | Disposition: A | Discharge status: Home / Self Care | Payer: Medicare HMO | Source: Ambulatory Visit | Attending: Radiation Oncology | Admitting: Radiation Oncology

## 2018-07-12 DIAGNOSIS — Z510 Encounter for antineoplastic radiation therapy: Secondary | ICD-10-CM | POA: Diagnosis not present

## 2018-07-12 DIAGNOSIS — C61 Malignant neoplasm of prostate: Secondary | ICD-10-CM | POA: Diagnosis not present

## 2018-07-15 ENCOUNTER — Ambulatory Visit
Admission: RE | Admit: 2018-07-15 | Discharge: 2018-07-15 | Disposition: A | Discharge status: Home / Self Care | Payer: Medicare HMO | Source: Ambulatory Visit | Attending: Radiation Oncology | Admitting: Radiation Oncology

## 2018-07-15 DIAGNOSIS — C61 Malignant neoplasm of prostate: Secondary | ICD-10-CM | POA: Diagnosis not present

## 2018-07-15 DIAGNOSIS — Z510 Encounter for antineoplastic radiation therapy: Secondary | ICD-10-CM | POA: Diagnosis not present

## 2018-07-16 ENCOUNTER — Ambulatory Visit
Admission: RE | Admit: 2018-07-16 | Discharge: 2018-07-16 | Disposition: A | Discharge status: Home / Self Care | Payer: Medicare HMO | Source: Ambulatory Visit | Attending: Radiation Oncology | Admitting: Radiation Oncology

## 2018-07-16 DIAGNOSIS — C61 Malignant neoplasm of prostate: Secondary | ICD-10-CM | POA: Diagnosis not present

## 2018-07-16 DIAGNOSIS — Z510 Encounter for antineoplastic radiation therapy: Secondary | ICD-10-CM | POA: Diagnosis not present

## 2018-07-17 ENCOUNTER — Ambulatory Visit
Admission: RE | Admit: 2018-07-17 | Discharge: 2018-07-17 | Disposition: A | Discharge status: Home / Self Care | Payer: Medicare HMO | Source: Ambulatory Visit | Attending: Radiation Oncology | Admitting: Radiation Oncology

## 2018-07-17 ENCOUNTER — Inpatient Hospital Stay: Payer: Medicare HMO

## 2018-07-17 DIAGNOSIS — C61 Malignant neoplasm of prostate: Secondary | ICD-10-CM

## 2018-07-17 DIAGNOSIS — Z510 Encounter for antineoplastic radiation therapy: Secondary | ICD-10-CM | POA: Diagnosis not present

## 2018-07-17 LAB — CBC
HCT: 35.5 % — ABNORMAL LOW (ref 40.0–52.0)
Hemoglobin: 11.4 g/dL — ABNORMAL LOW (ref 13.0–18.0)
MCH: 23.1 pg — ABNORMAL LOW (ref 26.0–34.0)
MCHC: 32.1 g/dL (ref 32.0–36.0)
MCV: 72.1 fL — ABNORMAL LOW (ref 80.0–100.0)
PLATELETS: 294 10*3/uL (ref 150–440)
RBC: 4.92 MIL/uL (ref 4.40–5.90)
RDW: 17.7 % — AB (ref 11.5–14.5)
WBC: 3.3 10*3/uL — ABNORMAL LOW (ref 3.8–10.6)

## 2018-07-18 ENCOUNTER — Ambulatory Visit
Admission: RE | Admit: 2018-07-18 | Discharge: 2018-07-18 | Disposition: A | Discharge status: Home / Self Care | Payer: Medicare HMO | Source: Ambulatory Visit | Attending: Radiation Oncology | Admitting: Radiation Oncology

## 2018-07-18 DIAGNOSIS — C61 Malignant neoplasm of prostate: Secondary | ICD-10-CM | POA: Diagnosis not present

## 2018-07-18 DIAGNOSIS — Z510 Encounter for antineoplastic radiation therapy: Secondary | ICD-10-CM | POA: Diagnosis not present

## 2018-07-19 ENCOUNTER — Ambulatory Visit
Admission: RE | Admit: 2018-07-19 | Discharge: 2018-07-19 | Disposition: A | Discharge status: Home / Self Care | Payer: Medicare HMO | Source: Ambulatory Visit | Attending: Radiation Oncology | Admitting: Radiation Oncology

## 2018-07-19 DIAGNOSIS — Z510 Encounter for antineoplastic radiation therapy: Secondary | ICD-10-CM | POA: Diagnosis not present

## 2018-07-19 DIAGNOSIS — C61 Malignant neoplasm of prostate: Secondary | ICD-10-CM | POA: Diagnosis not present

## 2018-07-22 ENCOUNTER — Ambulatory Visit
Admission: RE | Admit: 2018-07-22 | Discharge: 2018-07-22 | Disposition: A | Discharge status: Home / Self Care | Payer: Medicare HMO | Source: Ambulatory Visit | Attending: Radiation Oncology | Admitting: Radiation Oncology

## 2018-07-22 DIAGNOSIS — Z510 Encounter for antineoplastic radiation therapy: Secondary | ICD-10-CM | POA: Diagnosis not present

## 2018-07-22 DIAGNOSIS — C61 Malignant neoplasm of prostate: Secondary | ICD-10-CM | POA: Diagnosis not present

## 2018-07-23 ENCOUNTER — Ambulatory Visit
Admission: RE | Admit: 2018-07-23 | Discharge: 2018-07-23 | Disposition: A | Discharge status: Home / Self Care | Payer: Medicare HMO | Source: Ambulatory Visit | Attending: Radiation Oncology | Admitting: Radiation Oncology

## 2018-07-23 DIAGNOSIS — Z510 Encounter for antineoplastic radiation therapy: Secondary | ICD-10-CM | POA: Diagnosis not present

## 2018-07-23 DIAGNOSIS — C61 Malignant neoplasm of prostate: Secondary | ICD-10-CM | POA: Diagnosis not present

## 2018-07-24 ENCOUNTER — Ambulatory Visit
Admission: RE | Admit: 2018-07-24 | Discharge: 2018-07-24 | Disposition: A | Discharge status: Home / Self Care | Payer: Medicare HMO | Source: Ambulatory Visit | Attending: Radiation Oncology | Admitting: Radiation Oncology

## 2018-07-24 DIAGNOSIS — Z510 Encounter for antineoplastic radiation therapy: Secondary | ICD-10-CM | POA: Diagnosis not present

## 2018-07-24 DIAGNOSIS — C61 Malignant neoplasm of prostate: Secondary | ICD-10-CM | POA: Diagnosis not present

## 2018-07-25 ENCOUNTER — Ambulatory Visit
Admission: RE | Admit: 2018-07-25 | Discharge: 2018-07-25 | Disposition: A | Discharge status: Home / Self Care | Payer: Medicare HMO | Source: Ambulatory Visit | Attending: Radiation Oncology | Admitting: Radiation Oncology

## 2018-07-25 DIAGNOSIS — C61 Malignant neoplasm of prostate: Secondary | ICD-10-CM | POA: Diagnosis not present

## 2018-07-25 DIAGNOSIS — Z510 Encounter for antineoplastic radiation therapy: Secondary | ICD-10-CM | POA: Diagnosis not present

## 2018-07-26 ENCOUNTER — Ambulatory Visit: Payer: Medicare HMO

## 2018-07-29 ENCOUNTER — Ambulatory Visit
Admission: RE | Admit: 2018-07-29 | Discharge: 2018-07-29 | Disposition: A | Discharge status: Home / Self Care | Payer: Medicare HMO | Source: Ambulatory Visit | Attending: Radiation Oncology | Admitting: Radiation Oncology

## 2018-07-29 DIAGNOSIS — C61 Malignant neoplasm of prostate: Secondary | ICD-10-CM | POA: Diagnosis not present

## 2018-07-29 DIAGNOSIS — Z510 Encounter for antineoplastic radiation therapy: Secondary | ICD-10-CM | POA: Diagnosis not present

## 2018-07-30 ENCOUNTER — Ambulatory Visit: Payer: Medicare HMO

## 2018-07-31 ENCOUNTER — Inpatient Hospital Stay: Payer: Medicare HMO | Attending: Radiation Oncology

## 2018-07-31 ENCOUNTER — Ambulatory Visit
Admission: RE | Admit: 2018-07-31 | Discharge: 2018-07-31 | Disposition: A | Discharge status: Home / Self Care | Payer: Medicare HMO | Source: Ambulatory Visit | Attending: Radiation Oncology | Admitting: Radiation Oncology

## 2018-07-31 DIAGNOSIS — Z510 Encounter for antineoplastic radiation therapy: Secondary | ICD-10-CM | POA: Diagnosis not present

## 2018-07-31 DIAGNOSIS — C61 Malignant neoplasm of prostate: Secondary | ICD-10-CM | POA: Insufficient documentation

## 2018-07-31 LAB — CBC
HEMATOCRIT: 36.9 % — AB (ref 40.0–52.0)
Hemoglobin: 11.9 g/dL — ABNORMAL LOW (ref 13.0–18.0)
MCH: 23.3 pg — ABNORMAL LOW (ref 26.0–34.0)
MCHC: 32.1 g/dL (ref 32.0–36.0)
MCV: 72.6 fL — ABNORMAL LOW (ref 80.0–100.0)
PLATELETS: 299 10*3/uL (ref 150–440)
RBC: 5.09 MIL/uL (ref 4.40–5.90)
RDW: 17.7 % — AB (ref 11.5–14.5)
WBC: 3.1 10*3/uL — AB (ref 3.8–10.6)

## 2018-08-01 ENCOUNTER — Ambulatory Visit
Admission: RE | Admit: 2018-08-01 | Discharge: 2018-08-01 | Disposition: A | Discharge status: Home / Self Care | Payer: Medicare HMO | Source: Ambulatory Visit | Attending: Radiation Oncology | Admitting: Radiation Oncology

## 2018-08-01 DIAGNOSIS — C61 Malignant neoplasm of prostate: Secondary | ICD-10-CM | POA: Diagnosis not present

## 2018-08-01 DIAGNOSIS — Z510 Encounter for antineoplastic radiation therapy: Secondary | ICD-10-CM | POA: Diagnosis not present

## 2018-08-02 ENCOUNTER — Ambulatory Visit
Admission: RE | Admit: 2018-08-02 | Discharge: 2018-08-02 | Disposition: A | Discharge status: Home / Self Care | Payer: Medicare HMO | Source: Ambulatory Visit | Attending: Radiation Oncology | Admitting: Radiation Oncology

## 2018-08-02 DIAGNOSIS — C61 Malignant neoplasm of prostate: Secondary | ICD-10-CM | POA: Diagnosis not present

## 2018-08-02 DIAGNOSIS — Z510 Encounter for antineoplastic radiation therapy: Secondary | ICD-10-CM | POA: Diagnosis not present

## 2018-08-05 ENCOUNTER — Ambulatory Visit
Admission: RE | Admit: 2018-08-05 | Discharge: 2018-08-05 | Disposition: A | Discharge status: Home / Self Care | Payer: Medicare HMO | Source: Ambulatory Visit | Attending: Radiation Oncology | Admitting: Radiation Oncology

## 2018-08-05 DIAGNOSIS — Z510 Encounter for antineoplastic radiation therapy: Secondary | ICD-10-CM | POA: Diagnosis not present

## 2018-08-05 DIAGNOSIS — C61 Malignant neoplasm of prostate: Secondary | ICD-10-CM | POA: Diagnosis not present

## 2018-08-06 ENCOUNTER — Ambulatory Visit
Admission: RE | Admit: 2018-08-06 | Discharge: 2018-08-06 | Disposition: A | Discharge status: Home / Self Care | Payer: Medicare HMO | Source: Ambulatory Visit | Attending: Radiation Oncology | Admitting: Radiation Oncology

## 2018-08-06 DIAGNOSIS — C61 Malignant neoplasm of prostate: Secondary | ICD-10-CM | POA: Diagnosis not present

## 2018-08-06 DIAGNOSIS — Z510 Encounter for antineoplastic radiation therapy: Secondary | ICD-10-CM | POA: Diagnosis not present

## 2018-08-07 ENCOUNTER — Ambulatory Visit
Admission: RE | Admit: 2018-08-07 | Discharge: 2018-08-07 | Disposition: A | Discharge status: Home / Self Care | Payer: Medicare HMO | Source: Ambulatory Visit | Attending: Radiation Oncology | Admitting: Radiation Oncology

## 2018-08-07 DIAGNOSIS — C61 Malignant neoplasm of prostate: Secondary | ICD-10-CM | POA: Diagnosis not present

## 2018-08-07 DIAGNOSIS — Z510 Encounter for antineoplastic radiation therapy: Secondary | ICD-10-CM | POA: Diagnosis not present

## 2018-08-08 ENCOUNTER — Ambulatory Visit
Admission: RE | Admit: 2018-08-08 | Discharge: 2018-08-08 | Disposition: A | Discharge status: Home / Self Care | Payer: Medicare HMO | Source: Ambulatory Visit | Attending: Radiation Oncology | Admitting: Radiation Oncology

## 2018-08-08 DIAGNOSIS — C61 Malignant neoplasm of prostate: Secondary | ICD-10-CM | POA: Diagnosis not present

## 2018-08-08 DIAGNOSIS — Z510 Encounter for antineoplastic radiation therapy: Secondary | ICD-10-CM | POA: Diagnosis not present

## 2018-08-09 ENCOUNTER — Ambulatory Visit: Payer: Medicare HMO

## 2018-08-09 ENCOUNTER — Ambulatory Visit
Admission: RE | Admit: 2018-08-09 | Discharge: 2018-08-09 | Disposition: A | Discharge status: Home / Self Care | Payer: Medicare HMO | Source: Ambulatory Visit | Attending: Radiation Oncology | Admitting: Radiation Oncology

## 2018-08-09 DIAGNOSIS — Z510 Encounter for antineoplastic radiation therapy: Secondary | ICD-10-CM | POA: Diagnosis not present

## 2018-08-09 DIAGNOSIS — C61 Malignant neoplasm of prostate: Secondary | ICD-10-CM | POA: Diagnosis not present

## 2018-08-11 ENCOUNTER — Ambulatory Visit: Admission: RE | Admit: 2018-08-11 | Payer: Medicare HMO | Source: Ambulatory Visit

## 2018-08-12 ENCOUNTER — Ambulatory Visit: Payer: Medicare HMO

## 2018-08-12 ENCOUNTER — Ambulatory Visit
Admission: RE | Admit: 2018-08-12 | Discharge: 2018-08-12 | Disposition: A | Discharge status: Home / Self Care | Payer: Medicare HMO | Source: Ambulatory Visit | Attending: Radiation Oncology | Admitting: Radiation Oncology

## 2018-08-12 DIAGNOSIS — Z510 Encounter for antineoplastic radiation therapy: Secondary | ICD-10-CM | POA: Diagnosis not present

## 2018-08-12 DIAGNOSIS — C61 Malignant neoplasm of prostate: Secondary | ICD-10-CM | POA: Diagnosis not present

## 2018-08-13 ENCOUNTER — Ambulatory Visit: Payer: Medicare HMO

## 2018-08-13 ENCOUNTER — Ambulatory Visit
Admission: RE | Admit: 2018-08-13 | Discharge: 2018-08-13 | Disposition: A | Discharge status: Home / Self Care | Payer: Medicare HMO | Source: Ambulatory Visit | Attending: Radiation Oncology | Admitting: Radiation Oncology

## 2018-08-13 DIAGNOSIS — C61 Malignant neoplasm of prostate: Secondary | ICD-10-CM | POA: Diagnosis not present

## 2018-08-13 DIAGNOSIS — Z510 Encounter for antineoplastic radiation therapy: Secondary | ICD-10-CM | POA: Diagnosis not present

## 2018-08-14 ENCOUNTER — Ambulatory Visit: Payer: Medicare HMO

## 2018-08-14 ENCOUNTER — Ambulatory Visit
Admission: RE | Admit: 2018-08-14 | Discharge: 2018-08-14 | Disposition: A | Discharge status: Home / Self Care | Payer: Medicare HMO | Source: Ambulatory Visit | Attending: Radiation Oncology | Admitting: Radiation Oncology

## 2018-08-14 DIAGNOSIS — C61 Malignant neoplasm of prostate: Secondary | ICD-10-CM | POA: Diagnosis not present

## 2018-08-14 DIAGNOSIS — Z510 Encounter for antineoplastic radiation therapy: Secondary | ICD-10-CM | POA: Diagnosis not present

## 2018-08-15 ENCOUNTER — Ambulatory Visit
Admission: RE | Admit: 2018-08-15 | Discharge: 2018-08-15 | Disposition: A | Discharge status: Home / Self Care | Payer: Medicare HMO | Source: Ambulatory Visit | Attending: Radiation Oncology | Admitting: Radiation Oncology

## 2018-08-15 DIAGNOSIS — C61 Malignant neoplasm of prostate: Secondary | ICD-10-CM | POA: Diagnosis not present

## 2018-08-15 DIAGNOSIS — Z510 Encounter for antineoplastic radiation therapy: Secondary | ICD-10-CM | POA: Diagnosis not present

## 2018-09-09 IMAGING — CR DG LUMBAR SPINE COMPLETE 4+V
1 series · 5 of 5 positions shown · non-contrast
Comparison: 03/26/2006

CLINICAL DATA: Low back pain for several years with right leg
radiculopathy. No acute injury is noted.

EXAM:
LUMBAR SPINE - COMPLETE 4+ VIEW

[Series 1: dg lumbar spine complete 4 +v · 0.14mm/px · 5 of 5 slices shown]
[im 1/5]
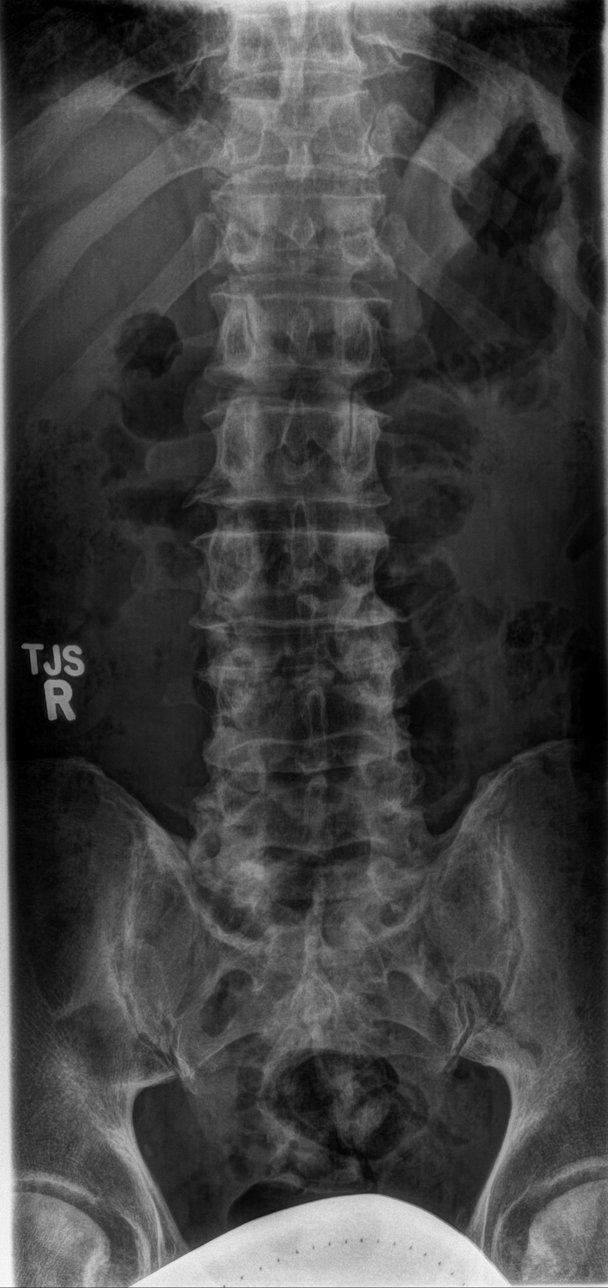
[im 2/5]
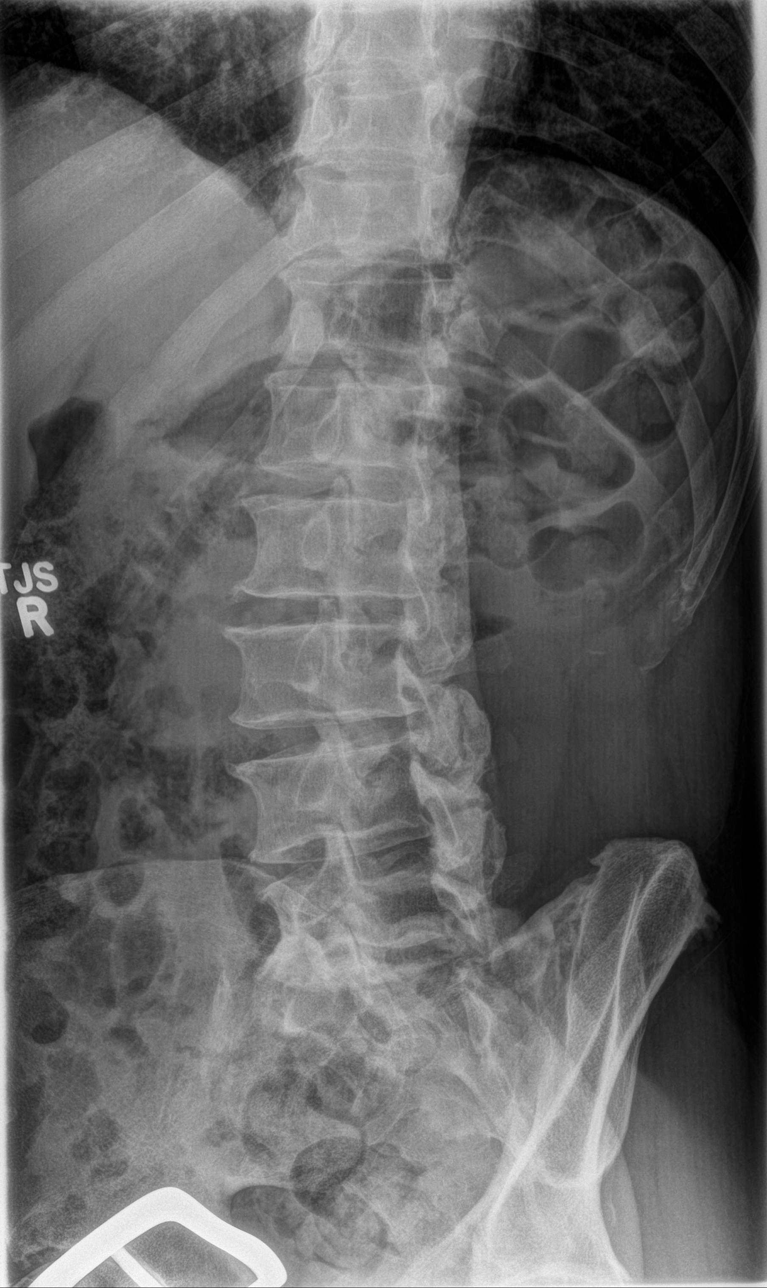
[im 3/5]
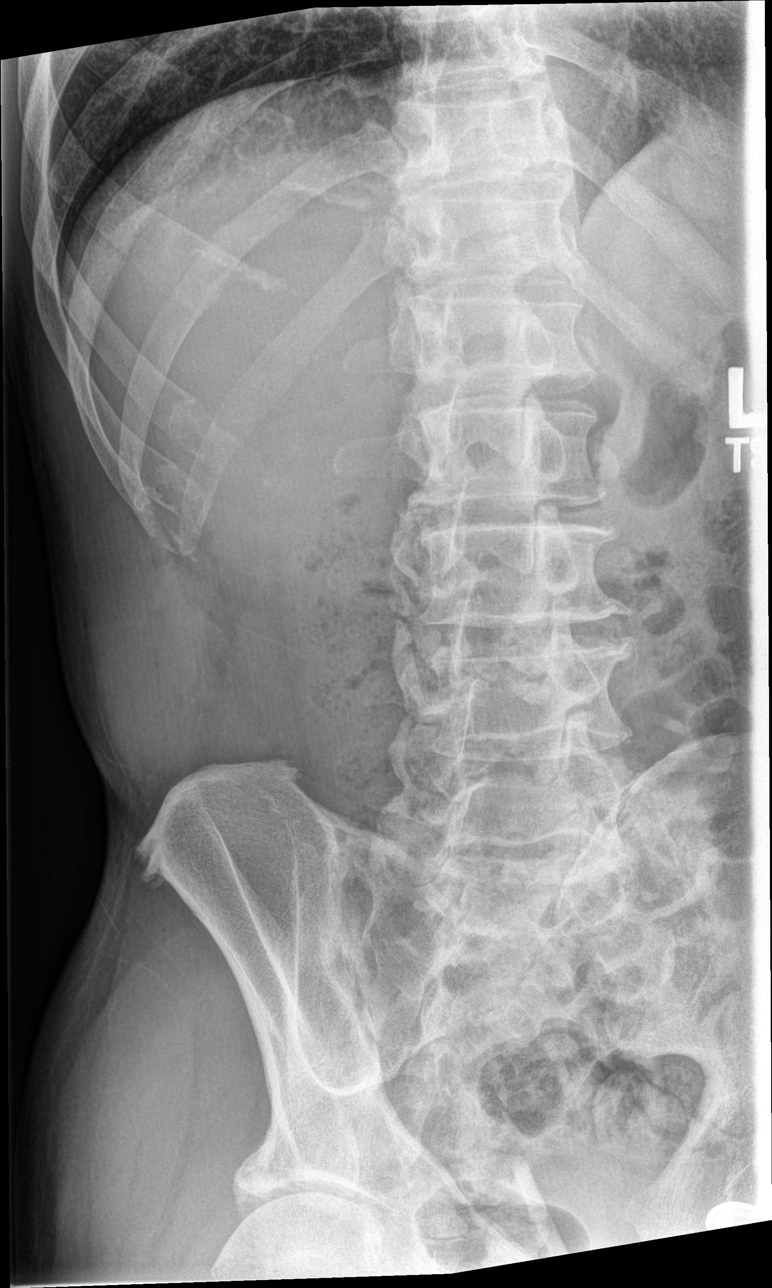
[im 4/5]
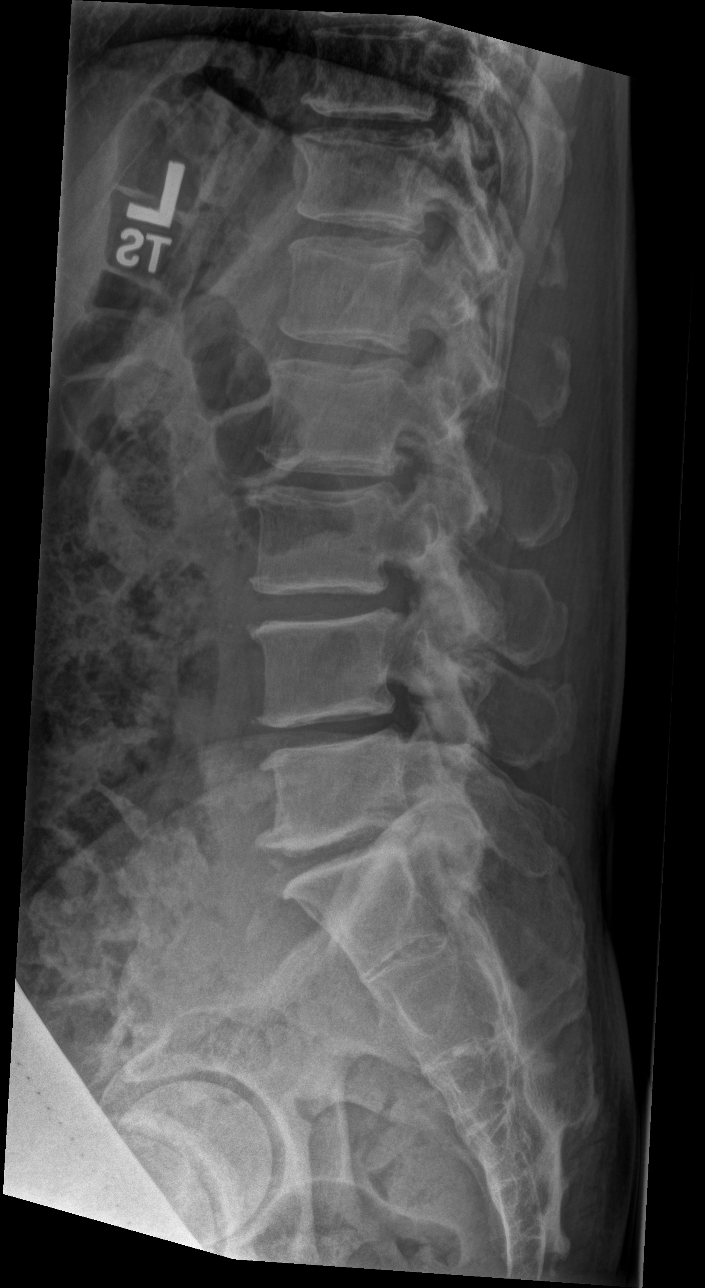
[im 5/5]
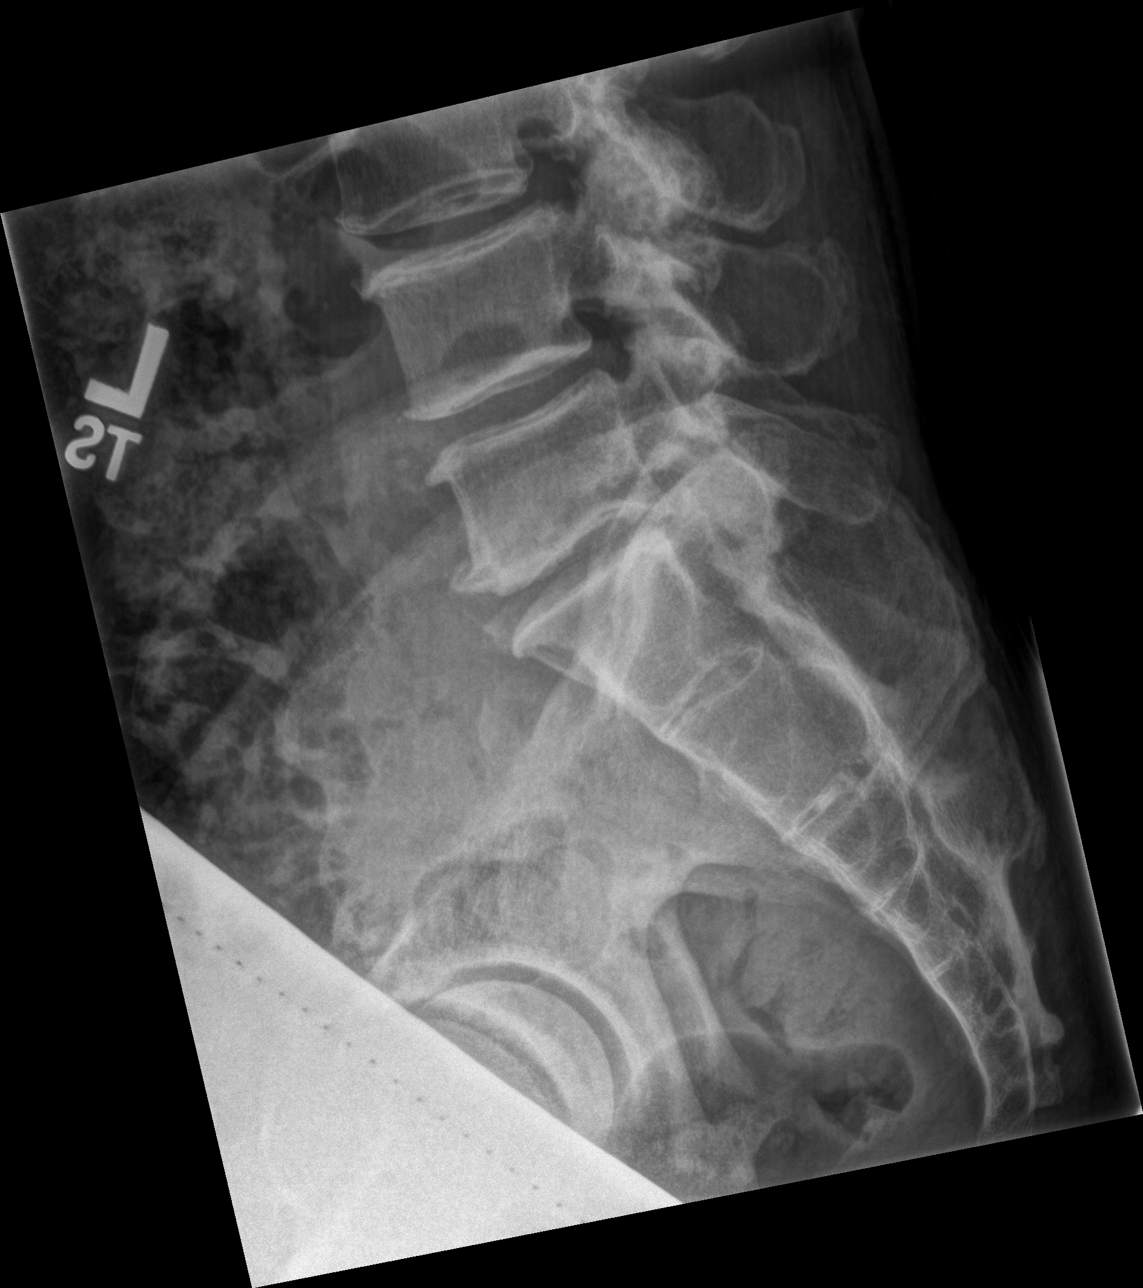

[5 of 5 positions shown; findings below may reference images not displayed]

FINDINGS: Five lumbar type vertebral bodies are well visualized. Vertebral
body height is well maintained. Facet hypertrophic changes are
noted. Multilevel osteophytic changes are seen. Disc space narrowing
is noted throughout the lumbar spine. No anterolisthesis is noted.
No soft tissue changes are noted.
IMPRESSION: Diffuse progressive degenerative change without acute abnormality.

## 2018-09-23 ENCOUNTER — Ambulatory Visit (INDEPENDENT_AMBULATORY_CARE_PROVIDER_SITE_OTHER): Payer: Medicare HMO

## 2018-09-23 ENCOUNTER — Encounter: Payer: Self-pay | Admitting: Family Medicine

## 2018-09-23 DIAGNOSIS — D709 Neutropenia, unspecified: Secondary | ICD-10-CM | POA: Insufficient documentation

## 2018-09-23 DIAGNOSIS — R718 Other abnormality of red blood cells: Secondary | ICD-10-CM | POA: Diagnosis not present

## 2018-09-23 DIAGNOSIS — Z23 Encounter for immunization: Secondary | ICD-10-CM

## 2018-09-23 DIAGNOSIS — D649 Anemia, unspecified: Secondary | ICD-10-CM | POA: Insufficient documentation

## 2018-09-25 ENCOUNTER — Other Ambulatory Visit: Payer: Self-pay

## 2018-09-25 ENCOUNTER — Other Ambulatory Visit: Payer: Self-pay | Admitting: *Deleted

## 2018-09-25 ENCOUNTER — Ambulatory Visit
Admission: RE | Admit: 2018-09-25 | Discharge: 2018-09-25 | Disposition: A | Discharge status: Home / Self Care | Payer: Medicare HMO | Source: Ambulatory Visit | Attending: Radiation Oncology | Admitting: Radiation Oncology

## 2018-09-25 ENCOUNTER — Encounter: Payer: Self-pay | Admitting: Radiation Oncology

## 2018-09-25 VITALS — BP 105/67 | HR 69 | Temp 95.2°F | Resp 18 | Wt 136.0 lb

## 2018-09-25 DIAGNOSIS — N3289 Other specified disorders of bladder: Secondary | ICD-10-CM | POA: Diagnosis not present

## 2018-09-25 DIAGNOSIS — C61 Malignant neoplasm of prostate: Secondary | ICD-10-CM | POA: Diagnosis not present

## 2018-09-25 DIAGNOSIS — Z923 Personal history of irradiation: Secondary | ICD-10-CM | POA: Insufficient documentation

## 2018-09-25 DIAGNOSIS — Z9079 Acquired absence of other genital organ(s): Secondary | ICD-10-CM | POA: Insufficient documentation

## 2018-09-25 MED ORDER — TAMSULOSIN HCL 0.4 MG PO CAPS: 0.4000 mg | ORAL_CAPSULE | Freq: Every day | ORAL | 6 refills | Status: DC

## 2018-09-25 MED ORDER — MIRABEGRON ER 25 MG PO TB24: 25.0000 mg | ORAL_TABLET | Freq: Every day | ORAL | 5 refills | Status: DC

## 2018-09-25 NOTE — Progress Notes (Signed)
Radiation Oncology Follow up Note  Name: Jerry Pena   Date:   09/25/2018 MRN:  [id number] DOB: 1951/08/28    This 67 y.o. male presents to the clinic today for one-month follow-up status post salvage radiation therapy for Gleason 7 (4+3) adenocarcinoma the prostate.  REFERRING PROVIDER: Arnetha Courser, MD  HPI: patient is a 67 year old male now out 1 month having completedsalvage radiation therapy to his prostatic fossa status post robotic prostatectomy and pelvic lymph node sampling for Gleason 7 (4+3) adenocarcinoma the prostate.he received both pelvic lymph node as well as prostate fossa radiation. He is seen today and doing fairly well states he's having nocturia 5. Also has some slight intermittent diarrhea follows no low residue diet her diet restrictions or takes any medication for diarrhea.  COMPLICATIONS OF TREATMENT: none  FOLLOW UP COMPLIANCE: keeps appointments   PHYSICAL EXAM:  BP 105/67 (BP Location: Left Arm, Patient Position: Sitting)   Pulse 69   Temp (!) 95.2 F (35.1 C) (Tympanic)   Resp 18   Wt 136 lb 0.4 oz (61.7 kg)   BMI 21.30 kg/m  Well-developed well-nourished patient in NAD. HEENT reveals PERLA, EOMI, discs not visualized.  Oral cavity is clear. No oral mucosal lesions are identified. Neck is clear without evidence of cervical or supraclavicular adenopathy. Lungs are clear to A&P. Cardiac examination is essentially unremarkable with regular rate and rhythm without murmur rub or thrill. Abdomen is benign with no organomegaly or masses noted. Motor sensory and DTR levels are equal and symmetric in the upper and lower extremities. Cranial nerves II through XII are grossly intact. Proprioception is intact. No peripheral adenopathy or edema is identified. No motor or sensory levels are noted. Crude visual fields are within normal range.  RADIOLOGY RESULTS: no current films for review  PLAN: present time patient is doing well. I'm starting him on Mybetric for  bladder spasm and urinary urgency and frequency. I've also set him up in 3-4 months for follow-up with a PSA prior to that visit. Patient is to call with any concerns at any time. He continues follow-up care with urology.  I would like to take this opportunity to thank you for allowing me to participate in the care of your patient.Noreene Filbert, MD

## 2018-09-26 ENCOUNTER — Telehealth: Payer: Self-pay

## 2018-09-26 DIAGNOSIS — D709 Neutropenia, unspecified: Secondary | ICD-10-CM

## 2018-09-26 DIAGNOSIS — D509 Iron deficiency anemia, unspecified: Secondary | ICD-10-CM

## 2018-09-26 LAB — CBC WITH DIFFERENTIAL/PLATELET
BASOS ABS: 51 {cells}/uL (ref 0–200)
BASOS PCT: 1.5 %
EOS PCT: 9.7 %
Eosinophils Absolute: 330 cells/uL (ref 15–500)
HCT: 39.4 % (ref 38.5–50.0)
HEMOGLOBIN: 12.6 g/dL — AB (ref 13.2–17.1)
Lymphs Abs: 826 cells/uL — ABNORMAL LOW (ref 850–3900)
MCH: 22.7 pg — AB (ref 27.0–33.0)
MCHC: 32 g/dL (ref 32.0–36.0)
MCV: 71.1 fL — ABNORMAL LOW (ref 80.0–100.0)
MONOS PCT: 9.7 %
MPV: 10.3 fL (ref 7.5–12.5)
NEUTROS ABS: 1863 {cells}/uL (ref 1500–7800)
Neutrophils Relative %: 54.8 %
PLATELETS: 315 10*3/uL (ref 140–400)
RBC: 5.54 10*6/uL (ref 4.20–5.80)
RDW: 17 % — ABNORMAL HIGH (ref 11.0–15.0)
Total Lymphocyte: 24.3 %
WBC mixed population: 330 cells/uL (ref 200–950)
WBC: 3.4 10*3/uL — ABNORMAL LOW (ref 3.8–10.8)

## 2018-09-26 LAB — HEMOGLOBINOPATHY EVALUATION
Fetal Hemoglobin Testing: 1.5 % (ref 0.0–1.9)
HEMATOCRIT: 40.6 % (ref 38.5–50.0)
Hemoglobin A2 - HGBRFX: 5.1 % — ABNORMAL HIGH (ref 1.8–3.5)
Hemoglobin: 12.8 g/dL — ABNORMAL LOW (ref 13.2–17.1)
Hgb A: 93.4 % — ABNORMAL LOW (ref >96.0)
MCH: 22.8 pg — ABNORMAL LOW (ref 27.0–33.0)
MCV: 72.4 fL — AB (ref 80.0–100.0)
RBC: 5.61 10*6/uL (ref 4.20–5.80)
RDW: 18.5 % — ABNORMAL HIGH (ref 11.0–15.0)

## 2018-09-26 LAB — PATHOLOGIST SMEAR REVIEW

## 2018-09-26 NOTE — Telephone Encounter (Signed)
-----   Message from Cathrine Muster, [employer] sent at 09/26/2018 10:06 AM EDT -----   ----- Message ----- From: Arnetha Courser, MD Sent: 09/25/2018   2:30 PM EDT To: Cathrine Muster, CMA  Please let pt know that we'd like him to see a hematologist since his white blood cell count is low. I think his red blood cells may be small because of an inherited condition, but we'll have the blood specialist evaluate him to be sure. Please REFER to hematologist, dx: microcytic anemia, neutropenia; thank you

## 2018-09-27 ENCOUNTER — Encounter: Payer: Self-pay | Admitting: Family Medicine

## 2018-09-27 DIAGNOSIS — D563 Thalassemia minor: Secondary | ICD-10-CM

## 2018-09-27 HISTORY — DX: Thalassemia minor: D56.3

## 2018-09-30 NOTE — Telephone Encounter (Signed)
erro  neous encounter

## 2018-10-04 ENCOUNTER — Inpatient Hospital Stay: Payer: Medicare HMO | Attending: Oncology | Admitting: Oncology

## 2018-10-04 ENCOUNTER — Encounter: Payer: Self-pay | Admitting: Oncology

## 2018-10-04 ENCOUNTER — Other Ambulatory Visit: Payer: Self-pay | Admitting: *Deleted

## 2018-10-04 ENCOUNTER — Other Ambulatory Visit: Payer: Self-pay

## 2018-10-04 VITALS — BP 123/79 | HR 61 | Temp 96.7°F | Resp 18 | Ht 68.0 in | Wt 135.3 lb

## 2018-10-04 DIAGNOSIS — Z803 Family history of malignant neoplasm of breast: Secondary | ICD-10-CM | POA: Diagnosis not present

## 2018-10-04 DIAGNOSIS — D509 Iron deficiency anemia, unspecified: Secondary | ICD-10-CM | POA: Diagnosis not present

## 2018-10-04 DIAGNOSIS — Z923 Personal history of irradiation: Secondary | ICD-10-CM

## 2018-10-04 DIAGNOSIS — M5136 Other intervertebral disc degeneration, lumbar region: Secondary | ICD-10-CM | POA: Diagnosis not present

## 2018-10-04 DIAGNOSIS — Z801 Family history of malignant neoplasm of trachea, bronchus and lung: Secondary | ICD-10-CM | POA: Diagnosis not present

## 2018-10-04 DIAGNOSIS — F17210 Nicotine dependence, cigarettes, uncomplicated: Secondary | ICD-10-CM

## 2018-10-04 DIAGNOSIS — D72810 Lymphocytopenia: Secondary | ICD-10-CM

## 2018-10-04 DIAGNOSIS — Z8546 Personal history of malignant neoplasm of prostate: Secondary | ICD-10-CM | POA: Diagnosis not present

## 2018-10-04 DIAGNOSIS — E785 Hyperlipidemia, unspecified: Secondary | ICD-10-CM

## 2018-10-04 DIAGNOSIS — D563 Thalassemia minor: Secondary | ICD-10-CM | POA: Diagnosis not present

## 2018-10-04 DIAGNOSIS — Z79899 Other long term (current) drug therapy: Secondary | ICD-10-CM | POA: Diagnosis not present

## 2018-10-04 DIAGNOSIS — Z8042 Family history of malignant neoplasm of prostate: Secondary | ICD-10-CM

## 2018-10-04 DIAGNOSIS — R718 Other abnormality of red blood cells: Secondary | ICD-10-CM

## 2018-10-04 MED ORDER — PHENAZOPYRIDINE HCL 200 MG PO TABS: 200.0000 mg | ORAL_TABLET | Freq: Three times a day (TID) | ORAL | 3 refills | Status: DC | PRN

## 2018-10-04 MED ORDER — FOLIC ACID 400 MCG PO TABS: 400.0000 ug | ORAL_TABLET | Freq: Every day | ORAL | 4 refills | Status: AC

## 2018-10-04 NOTE — Progress Notes (Signed)
Hematology/Oncology Consult note [facility] [phone] [phone]   Patient Care Team: Arnetha Courser, MD as PCP - General (Family Medicine) [name], MD as Consulting Physician (Urology) Noreene Filbert, MD as Consulting Physician (Radiation Oncology)  REFERRING PROVIDER: Arnetha Courser, MD CHIEF COMPLAINTS/REASON FOR VISIT:  Evaluation of microcytic anemia. Neutropenia.   HISTORY OF PRESENTING ILLNESS:  Jerry Pena is a  67 y.o.  male with PMH listed below who was referred to me for evaluation of microcytic anemia and neutropenia. Patient recently had CBC done at PCPs office. 09/23/2018, hemoglobin 12.6, MCV 71.1.  WBC 3.4, differential showed absolute lymphocyte 826 Pathology smear showed myeloid population consist predominantly with mature segmented neutrophils with reactive changes.  No immature cells are identified.  RBCs appears to be microcytic and hypochromic on smear. Patient had iron panel done on 06/14/2018 which showed iron 83, TIBC 294, iron saturation 28, ferritin 272. He also had a hemoglobinopathy evaluation done on 09/23/2018, hemoglobin 8 to elevated 5.1, consistent with beta thalassemia trait with anemia.  Patient reports that he is feeling well at baseline.  Denies any fatigue, weight loss, fever or chills.  Denies any frequent infection. He tells me that his son also has anemia.  # He has a history prostate cancer and a follows up with Dr. Baruch Gouty. Gleason 7 (4+3 ) adenocarcinoma [p [id number]]. received radical robotic prostatectomy with bilateral pelvic lymph node dissection on 12/10/2017.  Status post both pelvic lymph node as well as prostate fossa radiation.  Per note decline ADT secondary to hot flash. Most recent PSA less than 0.1 04/19/2018.  Review of Systems  Constitutional: Negative for chills, fever, malaise/fatigue and weight loss.  HENT: Negative for nosebleeds and sore throat.   Eyes: Negative for  double vision, photophobia and redness.  Respiratory: Negative for cough, shortness of breath and wheezing.   Cardiovascular: Negative for chest pain, palpitations and orthopnea.  Gastrointestinal: Negative for abdominal pain, blood in stool, nausea and vomiting.  Genitourinary: Negative for dysuria.       Nocturia  Musculoskeletal: Negative for back pain, myalgias and neck pain.  Skin: Negative for itching and rash.  Neurological: Negative for dizziness, tingling and tremors.  Endo/Heme/Allergies: Negative for environmental allergies. Does not bruise/bleed easily.  Psychiatric/Behavioral: Negative for depression.    MEDICAL HISTORY:  Past Medical History:  Diagnosis Date  . Beta thalassemia trait 09/27/2018  . Cancer ([place]) 10/2017   Prostate  . DDD (degenerative disc disease), lumbar 06/13/2017   Lumbar imaging June 2018  . Elevated PSA 09/12/2017   Refer to urologist, Sept 2018  . Facet hypertrophy of lumbar region 06/13/2017   Lumbar imaging June 2018  . Hyperlipidemia     SURGICAL HISTORY: Past Surgical History:  Procedure Laterality Date  . CATARACT EXTRACTION W/ INTRAOCULAR LENS IMPLANT Left 3/ 28/2014   eyes  . CATARACT EXTRACTION W/ INTRAOCULAR LENS IMPLANT Right 04/23/2013  . PELVIC LYMPH NODE DISSECTION N/A 12/10/2017   Procedure: PELVIC LYMPH NODE DISSECTION;  Surgeon: [name], MD;  Location: [facility] ORS;  Service: Urology;  Laterality: N/A;  . ROBOT ASSISTED LAPAROSCOPIC RADICAL PROSTATECTOMY N/A 12/10/2017   Procedure: ROBOTIC ASSISTED LAPAROSCOPIC RADICAL PROSTATECTOMY;  Surgeon: [name], MD;  Location: [facility] ORS;  Service: Urology;  Laterality: N/A;    SOCIAL HISTORY: Social History   Socioeconomic History  . Marital status: Divorced    Spouse name: Not on file  . Number of children: 4  . Years of education: Not on file  .  Highest education level: Associate degree: academic program  Occupational History  . Occupation: Retired  Scientific laboratory technician  .  Financial resource strain: Not hard at all  . Food insecurity:    Worry: Never true    Inability: Never true  . Transportation needs:    Medical: No    Non-medical: No  Tobacco Use  . Smoking status: Light Tobacco Smoker    Years: 48.00    Types: Cigars  . Smokeless tobacco: Never Used  . Tobacco comment: smokes on occasion - off and on  Substance and Sexual Activity  . Alcohol use: Yes    Alcohol/week: 0.0 standard drinks    Comment: beer rarely  . Drug use: No    Comment: used marijuana more than 10 years ago  . Sexual activity: Not Currently  Lifestyle  . Physical activity:    Days per week: 7 days    Minutes per session: 120 min  . Stress: Not at all  Relationships  . Social connections:    Talks on phone: Patient refused    Gets together: Patient refused    Attends religious service: Patient refused    Active member of club or organization: Patient refused    Attends meetings of clubs or organizations: Patient refused    Relationship status: Divorced  . Intimate partner violence:    Fear of current or ex partner: No    Emotionally abused: No    Physically abused: No    Forced sexual activity: No  Other Topics Concern  . Not on file  Social History Narrative  . Not on file    FAMILY HISTORY: Family History  Problem Relation Age of Onset  . Stroke Mother   . Stroke Father   . Cancer Sister        breast, lung  . Pulmonary Hypertension Sister   . Cancer Son        prostate  . Prostate cancer Son   . Hypertension Sister   . Bladder Cancer Neg Hx   . Kidney cancer Neg Hx     ALLERGIES:  is allergic to aspirin and penicillins.  MEDICATIONS:  Current Outpatient Medications  Medication Sig Dispense Refill  . acetaminophen (TYLENOL) 500 MG tablet Take 1,000 mg by mouth every 6 (six) hours as needed for moderate pain or headache.     . docusate sodium (COLACE) 100 MG capsule Take 1 capsule (100 mg total) by mouth 2 (two) times daily. (Patient taking  differently: Take 100 mg by mouth daily as needed. ) 60 capsule 0  . ibuprofen (ADVIL,MOTRIN) 200 MG tablet Take 400 mg by mouth every 6 (six) hours as needed for headache or moderate pain.    . Melatonin 5 MG TABS Take 5 mg by mouth daily as needed.    . mirabegron ER (MYRBETRIQ) 25 MG TB24 tablet Take 1 tablet (25 mg total) by mouth daily. 30 tablet 5  . Multiple Vitamin (MULTIVITAMIN) tablet Take 1 tablet by mouth daily.    . sildenafil (REVATIO) 20 MG tablet Take 1 tablet (20 mg total) by mouth as needed. Take 1-5 tabs as needed prior to intercourse 30 tablet 11  . vitamin B-12 (CYANOCOBALAMIN) 100 MCG tablet Take 100 mcg by mouth daily.    . folic acid (FOLVITE) 263 MCG tablet Take 1 tablet (400 mcg total) by mouth daily. 90 tablet 4  . phenazopyridine (PYRIDIUM) 200 MG tablet Take 1 tablet (200 mg total) by mouth 3 (three) times daily as needed  for pain. 30 tablet 3   No current facility-administered medications for this visit.      PHYSICAL EXAMINATION: ECOG PERFORMANCE STATUS: 0 - Asymptomatic Vitals:   10/04/18 0957  BP: 123/79  Pulse: 61  Resp: 18  Temp: (!) 96.7 F (35.9 C)   Filed Weights   10/04/18 0957  Weight: 135 lb 4.8 oz (61.4 kg)    Physical Exam  Constitutional: He is oriented to person, place, and time. No distress.  HENT:  Head: Normocephalic and atraumatic.  Mouth/Throat: Oropharynx is clear and moist.  Eyes: Pupils are equal, round, and reactive to light. EOM are normal. No scleral icterus.  Neck: Normal range of motion. Neck supple.  Cardiovascular: Normal rate, regular rhythm and normal heart sounds.  Pulmonary/Chest: Effort normal. No respiratory distress. He has no wheezes.  Abdominal: Soft. Bowel sounds are normal. He exhibits no distension and no mass. There is no tenderness.  Musculoskeletal: Normal range of motion. He exhibits no edema or deformity.  Neurological: He is alert and oriented to person, place, and time. No cranial nerve deficit.  Coordination normal.  Skin: Skin is warm and dry. No rash noted. No erythema.  Psychiatric: He has a normal mood and affect. His behavior is normal. Thought content normal.     LABORATORY DATA:  I have reviewed the data as listed Lab Results  Component Value Date   WBC 3.4 (L) 09/23/2018   HGB 12.6 (L) 09/23/2018   HGB 12.8 (L) 09/23/2018   HCT 39.4 09/23/2018   HCT 40.6 09/23/2018   MCV 71.1 (L) 09/23/2018   MCV 72.4 (L) 09/23/2018   PLT 315 09/23/2018   Recent Labs    11/23/17 0959 12/11/17 0341 06/14/18 0911  NA 139 136 139  K 4.4 3.8 4.4  CL 102 104 103  CO2 27 27 31   GLUCOSE 103* 105* 91  BUN 16 13 18   CREATININE 0.97 1.01 0.95  CALCIUM 9.2 8.2* 9.8  GFRNONAA >60 >60  --   GFRAA >60 >60  --    Iron/TIBC/Ferritin/ %Sat    Component Value Date/Time   IRON 83 06/14/2018 0911   TIBC 294 06/14/2018 0911   FERRITIN 272 06/14/2018 0911   IRONPCTSAT 28 06/14/2018 0911       ASSESSMENT & PLAN:  1. Beta thalassemia trait   2. Microcytosis   3. Lymphocytopenia    Discussed with patient and explained to him about beta thalassemia trait.  Microcytic anemia is secondary to beta thalassemia trait and he is asymptomatic.  Suggest patient avoid iron supplements. I suggest patient to take folic acid 710 mg daily given the higher turnover of his red blood cells Mild lymphocytopenia, smear normal.  Avoid alcohol consumption.  Recommend repeat CBC in 1 year. Patient was provided with educational material patient information for thalassemia. History of prostate cancer, continue follow-up with radiation oncology and urology.  Orders Placed This Encounter  Procedures  . CBC with Differential/Platelet    Standing Status:   Future    Standing Expiration Date:   10/05/2019  . Comprehensive metabolic panel    Standing Status:   Future    Standing Expiration Date:   10/05/2019    All questions were answered. The patient knows to call the clinic with any problems questions or  concerns.  Return of visit: 1 year Thank you for this kind referral and the opportunity to participate in the care of this patient. A copy of today's note is routed to referring provider  Total  face to face encounter time for this patient visit was 45 min. >50% of the time was  spent in counseling and coordination of care.    Earlie Server, MD, PhD Hematology Oncology [facility] [address] Pager- [phone] 10/04/2018

## 2018-10-04 NOTE — Progress Notes (Signed)
Patient here for initial visit. He is having burning when voiding. He currently smokes off an on .

## 2018-11-12 NOTE — Progress Notes (Signed)
11/13/2018 10:13 AM   Arma Heading 02/20/1951 [id number]  Referring provider: Arnetha Courser, MD [address] [place], [place] [address]  Chief Complaint  Patient presents with  . Prostate Cancer   HPI: Jerry Pena is a 67 yo M who returns today for a 6 month follow-up for the management and evaluation of Gleason 4+3 prostate cancer. The patient's last visit with [place] was on 05/24/2018.   He is s/p radical robotic prostatectomy with bilateral lymph node dissection on 12/10/2017. A non nerve sparing approach was used on the right side with partial nerve sparing on the left.   His postoperative course was complicated by premature Foley removal by the patient himself.  This was replaced in the clinic and remained for an additional week. Ultimately, his catheter was removed on 12/21/2017.  Surgical pathology consistent with Gleason 4+3 prostate cancer since of extraprostatic extension but negative margins. In addition to this, 1 of 4 nodes from the right pelvic lymph nodes including external iliac and obturator positive, 0 of 2 on the left. pT3aN1 Mx.   Postop PSA  0.2. His most recent PSA from 04/19/18 was undetectable. Most recent PSA was based on pathology. Referred to radiation oncology for adjvant treatment (whole pelvic radiation). He received Lupron on 2/19 x 3 month depo but has declined further injections due to severe hot flashes and intolerance of this medication.  The patient reports today that his incontinence worsened after radiation from 08/15/2018 and is currently on Myrbetriq which did not help with the symptoms. Pt is "sometimes" aware of the leakage. Pt changes 6 pads a day after radiation as opposed to 4 prior to radiation. Pt reports the pads not being completely saturated when changing. Pt reports being depressed and continues to do PT exercises every single day.   Pt reports of having difficulty with erections.  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  He is now interested in pursuing this.  He has transportation issues and thus difficulty picking up the medication.    PMH: Past Medical History:  Diagnosis Date  . Beta thalassemia trait 09/27/2018  . Cancer ([place]) 10/2017   Prostate  . DDD (degenerative disc disease), lumbar 06/13/2017   Lumbar imaging June 2018  . Elevated PSA 09/12/2017   Refer to urologist, Sept 2018  . Facet hypertrophy of lumbar region 06/13/2017   Lumbar imaging June 2018  . Hyperlipidemia     Surgical History: Past Surgical History:  Procedure Laterality Date  . CATARACT EXTRACTION W/ INTRAOCULAR LENS IMPLANT Left 3/ 28/2014   eyes  . CATARACT EXTRACTION W/ INTRAOCULAR LENS IMPLANT Right 04/23/2013  . PELVIC LYMPH NODE DISSECTION N/A 12/10/2017   Procedure: PELVIC LYMPH NODE DISSECTION;  Surgeon: [name], MD;  Location: [facility] ORS;  Service: Urology;  Laterality: N/A;  . ROBOT ASSISTED LAPAROSCOPIC RADICAL PROSTATECTOMY N/A 12/10/2017   Procedure: ROBOTIC ASSISTED LAPAROSCOPIC RADICAL PROSTATECTOMY;  Surgeon: [name], MD;  Location: [facility] ORS;  Service: Urology;  Laterality: N/A;    Home Medications:  Allergies as of 11/13/2018      Reactions   Aspirin Other (See Comments)   Upset stomach, indigestion and heart burn   Penicillins Hives, Other (See Comments)   Has patient had a PCN reaction causing immediate rash, facial/tongue/throat swelling, SOB or lightheadedness with hypotension: Yes Has patient had a PCN reaction causing severe rash involving mucus membranes or skin necrosis: No Has patient had a PCN reaction that required hospitalization: Yes Has patient  had a PCN reaction occurring within the last 10 years: No If all of the above answers are "NO", then may proceed with Cephalosporin use.      Medication List        Accurate as of 11/13/18 10:13 AM. Always use your most recent med list.          acetaminophen 500 MG tablet Commonly known as:  TYLENOL Take 1,000 mg by  mouth every 6 (six) hours as needed for moderate pain or headache.   docusate sodium 100 MG capsule Commonly known as:  COLACE Take 1 capsule (100 mg total) by mouth 2 (two) times daily.   folic acid 347 MCG tablet Commonly known as:  FOLVITE Take 1 tablet (400 mcg total) by mouth daily.   ibuprofen 200 MG tablet Commonly known as:  ADVIL,MOTRIN Take 400 mg by mouth every 6 (six) hours as needed for headache or moderate pain.   Melatonin 5 MG Tabs Take 5 mg by mouth daily as needed.   mirabegron ER 25 MG Tb24 tablet Commonly known as:  MYRBETRIQ Take 1 tablet (25 mg total) by mouth daily.   multivitamin tablet Take 1 tablet by mouth daily.   vitamin B-12 100 MCG tablet Commonly known as:  CYANOCOBALAMIN Take 100 mcg by mouth daily.       Allergies:  Allergies  Allergen Reactions  . Aspirin Other (See Comments)    Upset stomach, indigestion and heart burn  . Penicillins Hives and Other (See Comments)    Has patient had a PCN reaction causing immediate rash, facial/tongue/throat swelling, SOB or lightheadedness with hypotension: Yes Has patient had a PCN reaction causing severe rash involving mucus membranes or skin necrosis: No Has patient had a PCN reaction that required hospitalization: Yes Has patient had a PCN reaction occurring within the last 10 years: No If all of the above answers are "NO", then may proceed with Cephalosporin use.     Family History: Family History  Problem Relation Age of Onset  . Stroke Mother   . Stroke Father   . Cancer Sister        breast, lung  . Pulmonary Hypertension Sister   . Cancer Son        prostate  . Prostate cancer Son   . Hypertension Sister   . Bladder Cancer Neg Hx   . Kidney cancer Neg Hx     Social History:  reports that he has been smoking cigars. He has smoked for the past 48.00 years. He has never used smokeless tobacco. He reports that he drinks alcohol. He reports that he does not use  drugs.  ROS: UROLOGY Frequent Urination?: Yes Hard to postpone urination?: No Burning/pain with urination?: No Get up at night to urinate?: Yes Leakage of urine?: Yes Urine stream starts and stops?: No Trouble starting stream?: No Do you have to strain to urinate?: No Blood in urine?: No Urinary tract infection?: No Sexually transmitted disease?: No Injury to kidneys or bladder?: No Painful intercourse?: No Weak stream?: No Erection problems?: Yes Penile pain?: No  Gastrointestinal Nausea?: No Vomiting?: No Indigestion/heartburn?: No Diarrhea?: No Constipation?: No  Constitutional Fever: No Night sweats?: No Weight loss?: No Fatigue?: No  Skin Skin rash/lesions?: No Itching?: No  Eyes Blurred vision?: No Double vision?: No  Ears/Nose/Throat Sore throat?: No Sinus problems?: No  Hematologic/Lymphatic Swollen glands?: No Easy bruising?: No  Cardiovascular Leg swelling?: No Chest pain?: No  Respiratory Cough?: No Shortness of breath?: No  Endocrine  Excessive thirst?: No  Musculoskeletal Back pain?: Yes Joint pain?: No  Neurological Headaches?: No Dizziness?: No  Psychologic Depression?: No Anxiety?: No  Physical Exam: BP 111/72   Pulse 85   Wt 133 lb (60.3 kg)   BMI 20.22 kg/m   Constitutional:  Alert and oriented, No acute distress. HEENT: [place] AT, moist mucus membranes.  Trachea midline, no masses. Cardiovascular: No clubbing, cyanosis, or edema. Respiratory: Normal respiratory effort, no increased work of breathing. Skin: No rashes, bruises or suspicious lesions. Neurologic: Grossly intact, no focal deficits, moving all 4 extremities. Psychiatric: Normal mood and affect.  Laboratory Data: Lab Results  Component Value Date   WBC 3.4 (L) 09/23/2018   HGB 12.6 (L) 09/23/2018   HGB 12.8 (L) 09/23/2018   HCT 39.4 09/23/2018   HCT 40.6 09/23/2018   MCV 71.1 (L) 09/23/2018   MCV 72.4 (L) 09/23/2018   PLT 315 09/23/2018    Lab  Results  Component Value Date   CREATININE 0.95 06/14/2018    Assessment & Plan:    1. Prostate cancer ([place]) pT3a N1 with persistent PSA of 0.2 s/p RALP with BPNLD 11/2017  Staging via axman PET negative  Refuses any further Lupron secondary to hot flashes today, declined treatment with antidepressants, etc. for symptom control s/p whole pelvic salvage radiation completed several months ago PSA today pending  Monitor PSA closely and follow-up in 3 months If PSA rises consider oral antihormone pills w/o lupron  2. Stress incontinence of urine Continue pelvic floor exercises Worsened now, 6 pads a day after radiation  Surgery recommended if leakage occurs but not advised to do surgery currently due to radiation, recommend additional time for recovery  Incontinence penile clamp recommended for incontinence.     3. Erectile dysfunction after radical prostatectomy  No response to PDE 5 inhibitors  Interested in intracavernosal injections  We will arrange for teaching session, advised to pick up test dose 1 day prior to visit.  Return for injection teaching with Larene Beach on 12/4, then MD visit with me in 3 mo for PSA Transportation may limit ability to pick up medication 12/4.   Return in about 3 months (around 02/13/2019) for PSA .  [facility] [address] [place], [place] [address] [phone]  I, Lucas Mallow, am acting as a scribe for Dr. [name],  I have reviewed the above documentation for accuracy and completeness, and I agree with the above.   [name], MD

## 2018-11-13 ENCOUNTER — Ambulatory Visit (INDEPENDENT_AMBULATORY_CARE_PROVIDER_SITE_OTHER): Payer: Medicare HMO | Admitting: Urology

## 2018-11-13 ENCOUNTER — Other Ambulatory Visit: Payer: Self-pay

## 2018-11-13 ENCOUNTER — Encounter: Payer: Self-pay | Admitting: Urology

## 2018-11-13 VITALS — BP 111/72 | HR 85 | Wt 133.0 lb

## 2018-11-13 DIAGNOSIS — N5231 Erectile dysfunction following radical prostatectomy: Secondary | ICD-10-CM

## 2018-11-13 DIAGNOSIS — C61 Malignant neoplasm of prostate: Secondary | ICD-10-CM | POA: Diagnosis not present

## 2018-11-13 DIAGNOSIS — N393 Stress incontinence (female) (male): Secondary | ICD-10-CM | POA: Diagnosis not present

## 2018-11-13 DIAGNOSIS — R972 Elevated prostate specific antigen [PSA]: Secondary | ICD-10-CM | POA: Diagnosis not present

## 2018-11-13 MED ORDER — AMBULATORY NON FORMULARY MEDICATION: 0 refills | Status: DC

## 2018-11-13 NOTE — Addendum Note (Signed)
Addended by: Amado Coe on: 11/13/2018 01:34 PM   Modules accepted: Orders

## 2018-11-14 ENCOUNTER — Telehealth: Payer: Self-pay

## 2018-11-14 LAB — PSA: Prostate Specific Ag, Serum: <0.1 ng/mL (ref 0.0–4.0)

## 2018-11-14 NOTE — Telephone Encounter (Signed)
-----   Message from [name], MD sent at 11/14/2018  8:11 AM EST ----- PSA remains undetectable.  Great news.    [name], MD

## 2018-11-14 NOTE — Telephone Encounter (Signed)
Spoke with patient and advised him his PSA was undetectable.  Patient was happy.

## 2018-11-27 ENCOUNTER — Encounter: Payer: Self-pay | Admitting: Urology

## 2018-11-27 ENCOUNTER — Ambulatory Visit (INDEPENDENT_AMBULATORY_CARE_PROVIDER_SITE_OTHER): Payer: Medicare HMO | Admitting: Urology

## 2018-11-27 VITALS — BP 103/68 | HR 87 | Ht 68.0 in | Wt 127.9 lb

## 2018-11-27 DIAGNOSIS — N5231 Erectile dysfunction following radical prostatectomy: Secondary | ICD-10-CM | POA: Diagnosis not present

## 2018-11-27 NOTE — Progress Notes (Signed)
11/27/2018 11:34 AM  Arma Heading [birth year] [id number]  Referring provider: Arnetha Courser, MD [address] [place], [place] [address]  Chief Complaint  Patient presents with  . [employer] teaching   HPI: Jerry Pena is a 67 y.o. African American male with a history of Gleason 4+3 prostate cancer and erectile dysfunction who presents today for TriMix injection trial.    Pt expresses that he has tried OTC to absolutely no avail. Pt denies history of sickle cell.  Erectile dysfunction after radical prostatectomy  No response to PDE 5 inhibitors  Interested in intracavernosal injections  We will arrange for teaching session, advised to pick up test dose 1 day prior to visit.  Return for injection teaching with Larene Beach on 12/4, then MD visit with me in 3 mo for PSA Transportation may limit ability to pick up medication 12/4.  He is s/p radical robotic prostatectomy with bilateral lymph node dissection on 12/10/2017. A non nerve sparing approach was used on the right side with partial nerve sparing on the left.     Physical Exam:  BP 103/68 (BP Location: Right Arm, Patient Position: Sitting, Cuff Size: Normal)   Pulse 87   Ht 5\' 8"  (1.727 m)   Wt 127 lb 14.4 oz (58 kg)   BMI 19.45 kg/m   Constitutional:  Well nourished. Alert and oriented, No acute distress. Head: Normocephalic and atraumatic Pulmonary/Chest: Normal effort, no respiratory distress. GU: No CVA tenderness.  No bladder fullness or masses.  Patient with uncircumcised phallus.  Urethral meatus is patent.  No penile discharge. No penile lesions or rashes. Scrotum without lesions, cysts, rashes and/or edema.   Skin: Warm and dry Psychiatric: Normal mood and affect.  Procedure  Patient's right corpus cavernosum is identified.  An area near the base of the penis is cleansed with rubbing alcohol.  Careful to avoid the dorsal vein, 3 mcg of Trimix (papaverine 30 mg, phentolamine 1 mg and prostaglandin E1 10 mcg, Lot  # [id number]  exp # 11/28/2018 is injected at a 90 degree angle into the right corpus cavernosum near the base of the penis.  Patient experienced a very firm erection in 15 minutes.    Assessment & Plan:    1. Erectile dysfunction after radical prostatectomy  - No response to PDE 5 inhibitors  - Patient instructed on self-administering injections. Instructed to tap the syringe to allow air bubbles to float to the top of the syringe, then depress the plunger to allow the air to escape. Explained how to select an injection site at the right base of his penis between 9 and 11:00 positions between the base and midportion of the penis, careful to avoid the 6 and 12:00 positions and any surface veins or arteries. Presented how to graft the head of the penis towards the right with light tension. Pt instructed to cleanse the area using an alcohol swab, inject at a 90 angle into the corpus cavernosum with 3 g of Trimix. He then applied compression to the injection site. - He tolerated the procedure well and experienced a satisfactory erection - He stated he had good comfort level with the process of the injections - advised not to increase dose more the 1 mcg with each injection - Advised patient of the condition of priapism, painful erection lasting for more than four hours, and to contact the office immediately or seek treatment in the ED   Return for RTC in 01/2018 with Dr. 02/2018 .  Erlene Quan,  PA-C  [facility] [address] [place], [place] [address] [phone]  I, Temidayo Atanda-[place] , am acting as a scribe for Nori Riis, PA-C  I have reviewed the above documentation for accuracy and completeness, and I agree with the above.    Zara Council, PA-C

## 2018-12-12 ENCOUNTER — Telehealth: Payer: Self-pay | Admitting: Urology

## 2018-12-12 NOTE — Telephone Encounter (Signed)
Would you call [name] and check on how the Trimix injections are going?

## 2018-12-12 NOTE — Telephone Encounter (Signed)
Jerry Pena,  I spoke with pt and per him he only has done 1 injection so far, he states as of right now he seems ok, no complaints

## 2018-12-16 ENCOUNTER — Ambulatory Visit (INDEPENDENT_AMBULATORY_CARE_PROVIDER_SITE_OTHER): Payer: Medicare HMO | Admitting: Family Medicine

## 2018-12-16 ENCOUNTER — Encounter: Payer: Self-pay | Admitting: Family Medicine

## 2018-12-16 DIAGNOSIS — Z720 Tobacco use: Secondary | ICD-10-CM

## 2018-12-16 DIAGNOSIS — D649 Anemia, unspecified: Secondary | ICD-10-CM | POA: Diagnosis not present

## 2018-12-16 DIAGNOSIS — D563 Thalassemia minor: Secondary | ICD-10-CM | POA: Diagnosis not present

## 2018-12-16 DIAGNOSIS — D709 Neutropenia, unspecified: Secondary | ICD-10-CM

## 2018-12-16 DIAGNOSIS — E782 Mixed hyperlipidemia: Secondary | ICD-10-CM | POA: Diagnosis not present

## 2018-12-16 DIAGNOSIS — C61 Malignant neoplasm of prostate: Secondary | ICD-10-CM | POA: Diagnosis not present

## 2018-12-16 NOTE — Progress Notes (Signed)
BP 112/66 (BP Location: Left Arm, Patient Position: Sitting, Cuff Size: Normal)   Pulse 80   Temp 97.8 F (36.6 C) (Oral)   Resp 16   Wt 138 lb 4.8 oz (62.7 kg)   SpO2 99%   BMI 21.03 kg/m    Subjective:    Patient ID: Jerry Pena, male    DOB: 10-15-1951, 67 y.o.   MRN: [id number]  HPI: Jerry Pena is a 67 y.o. male  Chief Complaint  Patient presents with  . Follow-up    6 mth f/u  . Hyperlipidemia  . Anemia  . Prostate Cancer    patient has an appt with the oncologist next month    HPI Here for f/u  Working with urologist on prostate cancer, seeing Dr. Baruch Gouty, going to see him next month Injection x 1 worked well with urologist  Seeing heme-onc; beta-thalassemia trait; avoiding iron; low white blood cells; energy is good; no recent infection  Hyperlipidemia; he does not eat a lot of fatty pork meats; does like cheese; 2 eggs a month; on no medicines, just diet controlled  Lab Results  Component Value Date   CHOL 168 06/14/2018   HDL 54 06/14/2018   LDLCALC 86 06/14/2018   TRIG 184 (H) 06/14/2018   CHOLHDL 3.1 06/14/2018   He has labs coming up with the other doctor on Januar 9th  Depression screen [facility] 2/9 12/16/2018 06/14/2018 06/07/2018 05/30/2018 06/13/2017  Decreased Interest 0 0 0 0 0  Down, Depressed, Hopeless 0 1 0 0 0  PHQ - 2 Score 0 1 0 0 0  Altered sleeping 0 1 0 - -  Tired, decreased energy 0 1 0 - -  Change in appetite 0 0 0 - -  Feeling bad or failure about yourself  0 0 0 - -  Trouble concentrating 0 0 0 - -  Moving slowly or fidgety/restless 0 0 0 - -  Suicidal thoughts 0 0 0 - -  PHQ-9 Score 0 3 0 - -  Difficult doing work/chores Not difficult at all Somewhat difficult Not difficult at all - -   Fall Risk  12/16/2018 06/14/2018 06/07/2018 05/30/2018 06/13/2017  Falls in the past year? 0 No No No No  Number falls in past yr: 0 - - - -  Injury with Fall? 0 - - - -  Risk for fall due to : - - Other (Comment) - -  Risk for fall due to:  Comment - - sciatica - -    Relevant past medical, surgical, family and social history reviewed Past Medical History:  Diagnosis Date  . Beta thalassemia trait 09/27/2018  . Cancer ([place]) 10/2017   Prostate  . DDD (degenerative disc disease), lumbar 06/13/2017   Lumbar imaging June 2018  . Elevated PSA 09/12/2017   Refer to urologist, Sept 2018  . Facet hypertrophy of lumbar region 06/13/2017   Lumbar imaging June 2018  . Hyperlipidemia    Past Surgical History:  Procedure Laterality Date  . CATARACT EXTRACTION W/ INTRAOCULAR LENS IMPLANT Left 3/ 28/2014   eyes  . CATARACT EXTRACTION W/ INTRAOCULAR LENS IMPLANT Right 04/23/2013  . PELVIC LYMPH NODE DISSECTION N/A 12/10/2017   Procedure: PELVIC LYMPH NODE DISSECTION;  Surgeon: [name], MD;  Location: [facility] ORS;  Service: Urology;  Laterality: N/A;  . ROBOT ASSISTED LAPAROSCOPIC RADICAL PROSTATECTOMY N/A 12/10/2017   Procedure: ROBOTIC ASSISTED LAPAROSCOPIC RADICAL PROSTATECTOMY;  Surgeon: [name], MD;  Location: [facility] ORS;  Service: Urology;  Laterality: N/A;  Family History  Problem Relation Age of Onset  . Stroke Mother   . Stroke Father   . Cancer Sister        breast, lung  . Pulmonary Hypertension Sister   . Cancer Son        prostate  . Prostate cancer Son   . Hypertension Sister   . Bladder Cancer Neg Hx   . Kidney cancer Neg Hx    Social History   Tobacco Use  . Smoking status: Light Tobacco Smoker    Years: 48.00    Types: Cigars  . Smokeless tobacco: Never Used  . Tobacco comment: smokes on occasion - off and on  Substance Use Topics  . Alcohol use: Yes    Alcohol/week: 0.0 standard drinks    Comment: beer rarely  . Drug use: No    Comment: used marijuana more than 10 years ago     Office Visit from 12/16/2018 in [facility]  AUDIT-C Score  2      Interim medical history since last visit reviewed. Allergies and medications reviewed  Review of Systems Per HPI  unless specifically indicated above     Objective:    BP 112/66 (BP Location: Left Arm, Patient Position: Sitting, Cuff Size: Normal)   Pulse 80   Temp 97.8 F (36.6 C) (Oral)   Resp 16   Wt 138 lb 4.8 oz (62.7 kg)   SpO2 99%   BMI 21.03 kg/m   Wt Readings from Last 3 Encounters:  12/16/18 138 lb 4.8 oz (62.7 kg)  11/27/18 127 lb 14.4 oz (58 kg)  11/13/18 133 lb (60.3 kg)    Physical Exam Constitutional:      General: He is not in acute distress.    Appearance: He is well-developed.  HENT:     Head: Normocephalic and atraumatic.  Eyes:     General: No scleral icterus. Neck:     Thyroid: No thyromegaly.  Cardiovascular:     Rate and Rhythm: Normal rate and regular rhythm.  Pulmonary:     Effort: Pulmonary effort is normal.     Breath sounds: Normal breath sounds.  Abdominal:     General: Bowel sounds are normal. There is no distension.     Palpations: Abdomen is soft.  Skin:    General: Skin is warm and dry.     Coloration: Skin is not pale.  Neurological:     Coordination: Coordination normal.  Psychiatric:        Behavior: Behavior normal.        Thought Content: Thought content normal.        Judgment: Judgment normal.     Results for orders placed or performed in visit on 11/13/18  PSA  Result Value Ref Range   Prostate Specific Ag, Serum <0.1 0.0 - 4.0 ng/mL      Assessment & Plan:   Problem List Items Addressed This Visit      Genitourinary   Prostate cancer ([place])    Under the care of radiation oncologist and urologist        Other   Tobacco abuse    He's thinking about quitting; just occasional cigar; no safe amount of tobacco      Neutropenia ([facility])    Stable, no infections; has been seen by heme-onc      Hyperlipidemia    Avoid / limit saturated fats; check lipids in June      Beta thalassemia trait  Stable, chronic; avooiding excess iron      Anemia    Good energy level; taking folic acid; no bleeding          Follow  up plan: No follow-ups on file.  An after-visit summary was printed and given to the patient at [facility].  Please see the patient instructions which may contain other information and recommendations beyond what is mentioned above in the assessment and plan.  No orders of the defined types were placed in this encounter.   No orders of the defined types were placed in this encounter.

## 2018-12-16 NOTE — Assessment & Plan Note (Signed)
Good energy level; taking folic acid; no bleeding

## 2018-12-16 NOTE — Assessment & Plan Note (Signed)
He's thinking about quitting; just occasional cigar; no safe amount of tobacco

## 2018-12-16 NOTE — Assessment & Plan Note (Signed)
Stable, no infections; has been seen by heme-onc

## 2018-12-16 NOTE — Assessment & Plan Note (Signed)
Avoid / limit saturated fats; check lipids in June

## 2018-12-16 NOTE — Assessment & Plan Note (Signed)
Stable, chronic; avooiding excess iron

## 2018-12-16 NOTE — Assessment & Plan Note (Signed)
Under the care of radiation oncologist and urologist

## 2018-12-23 NOTE — Telephone Encounter (Signed)
Patient called the office to report that he has used the trimix injections 4 times, but it has only worked twice.  Please advise.  Patient can be reached at [phone].

## 2018-12-26 NOTE — Telephone Encounter (Signed)
Please ask Mr. Hable how much of the Trimix he injected those four times.

## 2018-12-27 NOTE — Telephone Encounter (Signed)
Spoke with patient and he says he Trimix was 68ml.  He says it has been helping.

## 2018-12-27 NOTE — Telephone Encounter (Signed)
I would advise Jerry Pena that he should continue to inject the 3 mg and if he has an unsatisfactory erection, he may inject another 1 mcg into the other side of the penis, but no more for that specific interlude.  Advise patient of the condition of priapism, painful erection lasting for more than four hours, and to contact the office immediately or seek treatment in the ED.

## 2018-12-27 NOTE — Telephone Encounter (Signed)
Informed patient of Jerry Pena recommendations: continue to inject the 36mcg and if he has an unsatisfactory erection, he may inject another 17mcg into the other side of the penis, but no more for that specific interlude.  Advised patient of the condition of priapism, painful erection lasting for more than four hours, and to contact the office immediately or seek treatment in the ED.

## 2019-01-02 ENCOUNTER — Other Ambulatory Visit: Payer: Self-pay

## 2019-01-02 ENCOUNTER — Inpatient Hospital Stay: Payer: Medicare HMO | Attending: Oncology

## 2019-01-02 DIAGNOSIS — C61 Malignant neoplasm of prostate: Secondary | ICD-10-CM

## 2019-01-02 LAB — PSA: Prostatic Specific Antigen: 0.01 ng/mL (ref 0.00–4.00)

## 2019-01-03 ENCOUNTER — Telehealth: Payer: Self-pay | Admitting: Urology

## 2019-01-03 NOTE — Telephone Encounter (Signed)
Spoke with patient.  He is concerned about his medication being expired when he took it last.  Patient is going to call us back on Jan 31 to refill his testosterone.

## 2019-01-03 NOTE — Telephone Encounter (Signed)
Pt did not want to elaborate to me of why he wants to speak to [facility], would like a call to speak to [facility] @ [phone]. Tried to get more info from pt as to why he needs to talk to [facility], he has question about his " Vial of medication and expiration date" . Please advise pt. Thank you.

## 2019-01-09 ENCOUNTER — Encounter: Payer: Self-pay | Admitting: Radiation Oncology

## 2019-01-09 ENCOUNTER — Other Ambulatory Visit: Payer: Self-pay

## 2019-01-09 ENCOUNTER — Ambulatory Visit
Admission: RE | Admit: 2019-01-09 | Discharge: 2019-01-09 | Disposition: A | Discharge status: Home / Self Care | Payer: Medicare HMO | Source: Ambulatory Visit | Attending: Radiation Oncology | Admitting: Radiation Oncology

## 2019-01-09 VITALS — BP 117/77 | HR 62 | Temp 98.0°F | Resp 20 | Wt 133.8 lb

## 2019-01-09 DIAGNOSIS — N529 Male erectile dysfunction, unspecified: Secondary | ICD-10-CM | POA: Diagnosis not present

## 2019-01-09 DIAGNOSIS — R32 Unspecified urinary incontinence: Secondary | ICD-10-CM | POA: Insufficient documentation

## 2019-01-09 DIAGNOSIS — Z923 Personal history of irradiation: Secondary | ICD-10-CM | POA: Insufficient documentation

## 2019-01-09 DIAGNOSIS — C61 Malignant neoplasm of prostate: Secondary | ICD-10-CM | POA: Insufficient documentation

## 2019-01-09 NOTE — Progress Notes (Signed)
Radiation Oncology Follow up Note  Name: Jerry Pena   Date:   01/09/2019 MRN:  [id number] DOB: 19-Nov-1951    This 68 y.o. male presents to the clinic today for four-month follow-up status post salvage radiation therapy for Gleason 7 (4+3) adenocarcinoma prostate status post prostatectomy.  REFERRING PROVIDER: Arnetha Courser, MD  HPI: patient is a 68 year old male now out 4 months having completed salvage radiation therapy to his prostatic fossa for a Gleason 7 (4+3) adenocarcinoma the prostate. He received both prostatic fossa and pelvic lymph node radiation therapy and is seen today in routine follow-up. He is doing well still having some problems with erectile dysfunction although the injection seems to be working. He has some urinary incontinence wears a depends undergarment is being considered for surgical intervention by Dr. Erlene Quan. Fortunately his most recent PSA was 7.62.  COMPLICATIONS OF TREATMENT: none  FOLLOW UP COMPLIANCE: keeps appointments   PHYSICAL EXAM:  BP 117/77   Pulse 62   Temp 98 F (36.7 C)   Resp 20   Wt 133 lb 13.1 oz (60.7 kg)   BMI 20.35 kg/m  Well-developed well-nourished patient in NAD. HEENT reveals PERLA, EOMI, discs not visualized.  Oral cavity is clear. No oral mucosal lesions are identified. Neck is clear without evidence of cervical or supraclavicular adenopathy. Lungs are clear to A&P. Cardiac examination is essentially unremarkable with regular rate and rhythm without murmur rub or thrill. Abdomen is benign with no organomegaly or masses noted. Motor sensory and DTR levels are equal and symmetric in the upper and lower extremities. Cranial nerves II through XII are grossly intact. Proprioception is intact. No peripheral adenopathy or edema is identified. No motor or sensory levels are noted. Crude visual fields are within normal range.  RADIOLOGY RESULTS: films for review  PLAN: present time he is under excellent biochemical control status post  salvage radiation therapy for adenocarcinoma the prostate and please was overall progress. I've asked to see him back in 6 months for follow-up with a PSA prior to that visit. Patient continues close follow-up care with Dr. Erlene Quan addressing several issues including erectile dysfunction as well as urinary incontinence. Patient is to call with any concerns at any time.  I would like to take this opportunity to thank you for allowing me to participate in the care of your patient.Noreene Filbert, MD

## 2019-01-23 ENCOUNTER — Telehealth: Payer: Self-pay | Admitting: Urology

## 2019-01-23 DIAGNOSIS — G43909 Migraine, unspecified, not intractable, without status migrainosus: Secondary | ICD-10-CM | POA: Insufficient documentation

## 2019-01-23 DIAGNOSIS — H269 Unspecified cataract: Secondary | ICD-10-CM | POA: Insufficient documentation

## 2019-01-23 MED ORDER — AMBULATORY NON FORMULARY MEDICATION: 3 refills | Status: DC

## 2019-01-23 NOTE — Telephone Encounter (Signed)
Patient notified the RX will be sent to pharmacy

## 2019-01-23 NOTE — Telephone Encounter (Signed)
Patient called the office today to request a prescription for Trimix to be sent to the [facility] in [place].  Please call him with questions or concerns.

## 2019-01-27 ENCOUNTER — Encounter: Payer: Self-pay | Admitting: Family Medicine

## 2019-02-05 ENCOUNTER — Other Ambulatory Visit: Payer: Self-pay

## 2019-02-05 DIAGNOSIS — C61 Malignant neoplasm of prostate: Secondary | ICD-10-CM

## 2019-02-06 ENCOUNTER — Other Ambulatory Visit: Payer: Medicare HMO

## 2019-02-06 DIAGNOSIS — C61 Malignant neoplasm of prostate: Secondary | ICD-10-CM

## 2019-02-07 LAB — PSA

## 2019-02-12 ENCOUNTER — Ambulatory Visit (INDEPENDENT_AMBULATORY_CARE_PROVIDER_SITE_OTHER): Payer: Medicare HMO | Admitting: Urology

## 2019-02-12 ENCOUNTER — Encounter: Payer: Self-pay | Admitting: Urology

## 2019-02-12 VITALS — BP 104/67 | HR 79 | Ht 68.0 in | Wt 132.0 lb

## 2019-02-12 DIAGNOSIS — N393 Stress incontinence (female) (male): Secondary | ICD-10-CM | POA: Diagnosis not present

## 2019-02-12 DIAGNOSIS — C61 Malignant neoplasm of prostate: Secondary | ICD-10-CM | POA: Diagnosis not present

## 2019-02-12 DIAGNOSIS — N5231 Erectile dysfunction following radical prostatectomy: Secondary | ICD-10-CM

## 2019-02-12 NOTE — Progress Notes (Signed)
02/12/2019 10:25 AM   Arma Heading [birth year] [id number]  Referring provider: Arnetha Courser, MD [address] [place], [place] [address]  Chief Complaint  Patient presents with  . Prostate Cancer    HPI: Jerry Pena is a 68 yo M who returns today for a 3 month f/u for the evaluation and management of Gleason 4+3 prostate cancer.   He is s/p radical robotic prostatectomy with bilateral lymph node dissection on 12/10/2017. A non nerve sparing approach was used on the right side with partial nerve sparing on the left.   Surgical pathology consistent with Gleason 4+3 prostate cancer since of extraprostatic extension but negative margins. In addition to this, 1 of 4 nodes from the right pelvic lymph nodes including external iliac and obturator positive, 0 of 2 on the left. pT3aN1 Mx  Postop PSA  0.2. Referred to radiation oncology for adjvant treatment (whole pelvic radiation). He received Lupron on 2/19 x 3 month depo but has declined further injections due to severe hot flashes and intolerance of this medication.   His most recent PSA was undetectable as of 02/06/2019.   He returned to the office on 11/27/2018 for the evaluation and management of erectile dyfunction after radical prostatectomy. He was instructed on self-administering 3 g of Trimix. He states that Trimix worked twice and did not work after that. He was using Testdose vial beyond expiration date.  He reports of leakage and changes pads 3x a day even though not needed (change primarily for hygiene purposes). He changes that many times just in case. He is not bothered by these urinary symptoms.    PMH: Past Medical History:  Diagnosis Date  . Beta thalassemia trait 09/27/2018  . Cancer ([place]) 10/2017   Prostate  . DDD (degenerative disc disease), lumbar 06/13/2017   Lumbar imaging June 2018  . Elevated PSA 09/12/2017   Refer to urologist, Sept 2018  . Facet hypertrophy of lumbar region 06/13/2017   Lumbar  imaging June 2018  . Hyperlipidemia     Surgical History: Past Surgical History:  Procedure Laterality Date  . CATARACT EXTRACTION W/ INTRAOCULAR LENS IMPLANT Left 3/ 28/2014   eyes  . CATARACT EXTRACTION W/ INTRAOCULAR LENS IMPLANT Right 04/23/2013  . PELVIC LYMPH NODE DISSECTION N/A 12/10/2017   Procedure: PELVIC LYMPH NODE DISSECTION;  Surgeon: [name], MD;  Location: [facility] ORS;  Service: Urology;  Laterality: N/A;  . ROBOT ASSISTED LAPAROSCOPIC RADICAL PROSTATECTOMY N/A 12/10/2017   Procedure: ROBOTIC ASSISTED LAPAROSCOPIC RADICAL PROSTATECTOMY;  Surgeon: [name], MD;  Location: [facility] ORS;  Service: Urology;  Laterality: N/A;    Home Medications:  Allergies as of 02/12/2019      Reactions   Aspirin Other (See Comments)   Upset stomach, indigestion and heart burn   Penicillins Hives, Other (See Comments)   Has patient had a PCN reaction causing immediate rash, facial/tongue/throat swelling, SOB or lightheadedness with hypotension: Yes Has patient had a PCN reaction causing severe rash involving mucus membranes or skin necrosis: No Has patient had a PCN reaction that required hospitalization: Yes Has patient had a PCN reaction occurring within the last 10 years: No If all of the above answers are "NO", then may proceed with Cephalosporin use.      Medication List       Accurate as of February 12, 2019 10:25 AM. Always use your most recent med list.        acetaminophen 500 MG tablet Commonly known as:  TYLENOL Take 1,000  mg by mouth every 6 (six) hours as needed for moderate pain or headache.   AMBULATORY NON FORMULARY MEDICATION Medication Name: Compounded Medication   docusate sodium 100 MG capsule Commonly known as:  COLACE Take 1 capsule (100 mg total) by mouth 2 (two) times daily.   folic acid 400 MCG tablet Commonly known as:  FOLVITE Take 1 tablet (400 mcg total) by mouth daily.   ibuprofen 200 MG tablet Commonly known as:  ADVIL,MOTRIN Take  400 mg by mouth every 6 (six) hours as needed for headache or moderate pain.   Melatonin 5 MG Tabs Take 5 mg by mouth daily as needed.   mirabegron ER 25 MG Tb24 tablet Commonly known as:  MYRBETRIQ Take 1 tablet (25 mg total) by mouth daily.   multivitamin tablet Take 1 tablet by mouth daily.   vitamin B-12 100 MCG tablet Commonly known as:  CYANOCOBALAMIN Take 100 mcg by mouth daily.       Allergies:  Allergies  Allergen Reactions  . Aspirin Other (See Comments)    Upset stomach, indigestion and heart burn  . Penicillins Hives and Other (See Comments)    Has patient had a PCN reaction causing immediate rash, facial/tongue/throat swelling, SOB or lightheadedness with hypotension: Yes Has patient had a PCN reaction causing severe rash involving mucus membranes or skin necrosis: No Has patient had a PCN reaction that required hospitalization: Yes Has patient had a PCN reaction occurring within the last 10 years: No If all of the above answers are "NO", then may proceed with Cephalosporin use.     Family History: Family History  Problem Relation Age of Onset  . Stroke Mother   . Stroke Father   . Cancer Sister        breast, lung  . Pulmonary Hypertension Sister   . Cancer Son        prostate  . Prostate cancer Son   . Hypertension Sister   . Bladder Cancer Neg Hx   . Kidney cancer Neg Hx     Social History:  reports that he has been smoking cigars. He has smoked for the past 48.00 years. He has never used smokeless tobacco. He reports current alcohol use. He reports that he does not use drugs.  ROS: UROLOGY Frequent Urination?: No Hard to postpone urination?: No Burning/pain with urination?: No Get up at night to urinate?: No Leakage of urine?: Yes Urine stream starts and stops?: No Trouble starting stream?: No Do you have to strain to urinate?: No Blood in urine?: No Urinary tract infection?: No Sexually transmitted disease?: No Injury to kidneys or  bladder?: No Painful intercourse?: No Weak stream?: No Erection problems?: Yes Penile pain?: No  Gastrointestinal Nausea?: No Vomiting?: No Indigestion/heartburn?: No Diarrhea?: No Constipation?: No  Constitutional Fever: No Night sweats?: No Weight loss?: No Fatigue?: No  Skin Skin rash/lesions?: No Itching?: No  Eyes Blurred vision?: No Double vision?: No  Ears/Nose/Throat Sore throat?: No Sinus problems?: No  Hematologic/Lymphatic Swollen glands?: No Easy bruising?: No  Cardiovascular Leg swelling?: No Chest pain?: No  Respiratory Cough?: No Shortness of breath?: No  Endocrine Excessive thirst?: No  Musculoskeletal Back pain?: No Joint pain?: No  Neurological Headaches?: No Dizziness?: No  Psychologic Depression?: Yes Anxiety?: No  Physical Exam: BP 104/67 (BP Location: Left Arm, Patient Position: Sitting, Cuff Size: Normal)   Pulse 79   Ht 5\' 8"  (1.727 m)   Wt 132 lb (59.9 kg)   BMI 20.07 kg/m  Constitutional:  Alert and oriented, No acute distress. HEENT: [place] AT, moist mucus membranes.  Trachea midline, no masses. Cardiovascular: No clubbing, cyanosis, or edema. Respiratory: Normal respiratory effort, no increased work of breathing. Skin: No rashes, bruises or suspicious lesions. Neurologic: Grossly intact, no focal deficits, moving all 4 extremities. Psychiatric: Normal mood and affect.  Laboratory Data: Component     Latest Ref Rng & Units 10/02/2017 01/03/2018 04/19/2018 11/13/2018  Prostate Specific Ag, Serum     0.0 - 4.0 ng/mL 7.5 (H) 0.2 <0.1 <0.1   Component     Latest Ref Rng & Units 02/06/2019  Prostate Specific Ag, Serum     0.0 - 4.0 ng/mL <0.1   Assessment & Plan:    1. Prostate cancer ([place]) pT3a N1 with persistent PSA of 0.2 s/p RALP with BPNLD 11/2017  Staging via axman PET negative  Most recent PSA undetectable, 02/06/2019 Refusing further ADT If PSA rises consider oral agents w/o lupron Would recommend that  he continue to have PSAs every 3 months, alternating between myself and radiation oncology (will be seen 06/2019 by Dr. Baruch Gouty with labs) Return in about 6 months with PSA prior   2. Stress incontinence of urine Improved, pads 3x a day, unsaturated, from 6x  Overall minimal bother  3. Erectile dysfunction after radical prostatectomy  Trimix injections effective for first two trials, he was using Testdose vial beyond expiration which was inappropriate Encourage to use non-expired medication   Return in about 6 months (around 08/13/2019) for PSA prior .  [facility] [address] [place], [place] [address] [phone]  I, Lucas Mallow, am acting as a scribe for Dr. [name],  I have reviewed the above documentation for accuracy and completeness, and I agree with the above.   [name], MD

## 2019-04-17 ENCOUNTER — Encounter: Payer: Self-pay | Admitting: Family Medicine

## 2019-06-13 ENCOUNTER — Other Ambulatory Visit: Payer: Self-pay

## 2019-06-13 ENCOUNTER — Ambulatory Visit (INDEPENDENT_AMBULATORY_CARE_PROVIDER_SITE_OTHER): Payer: Medicare HMO | Admitting: Nurse Practitioner

## 2019-06-13 ENCOUNTER — Ambulatory Visit (INDEPENDENT_AMBULATORY_CARE_PROVIDER_SITE_OTHER): Payer: Medicare HMO

## 2019-06-13 ENCOUNTER — Encounter: Payer: Self-pay | Admitting: Nurse Practitioner

## 2019-06-13 VITALS — BP 106/66 | HR 61 | Temp 97.6°F | Resp 16 | Ht 68.0 in | Wt 135.1 lb

## 2019-06-13 VITALS — BP 106/66 | HR 61 | Temp 97.6°F | Resp 14 | Ht 68.0 in | Wt 135.1 lb

## 2019-06-13 DIAGNOSIS — Z1211 Encounter for screening for malignant neoplasm of colon: Secondary | ICD-10-CM

## 2019-06-13 DIAGNOSIS — M5136 Other intervertebral disc degeneration, lumbar region: Secondary | ICD-10-CM | POA: Diagnosis not present

## 2019-06-13 DIAGNOSIS — Z131 Encounter for screening for diabetes mellitus: Secondary | ICD-10-CM | POA: Diagnosis not present

## 2019-06-13 DIAGNOSIS — Z0000 Encounter for general adult medical examination without abnormal findings: Secondary | ICD-10-CM

## 2019-06-13 DIAGNOSIS — D709 Neutropenia, unspecified: Secondary | ICD-10-CM | POA: Diagnosis not present

## 2019-06-13 DIAGNOSIS — E782 Mixed hyperlipidemia: Secondary | ICD-10-CM | POA: Diagnosis not present

## 2019-06-13 DIAGNOSIS — C61 Malignant neoplasm of prostate: Secondary | ICD-10-CM | POA: Diagnosis not present

## 2019-06-13 NOTE — Patient Instructions (Signed)
Always:  - Increase water intake - Increase activity as tolerated.  _______________ Can Do always or sometimes:   - Metamucil Powder; 1 teaspon start once a day for 3 days then twice a day for 3 days then can go up to 3 times a day if needed.  - Take docusate sodium 100 mg twice a day  ________  Rarely just as needed; Please follow direction on box:   - Take Polyethylene glycol 17 grams per day  OR  - Sennosides 8.6 mg: 2 tablets orally once a day at bedtime   _______ If unable to have Bowel movement, having abdominal pain, or nausea and vomiting please get medical assistance immediately.

## 2019-06-13 NOTE — Patient Instructions (Signed)
Jerry Pena , Thank you for taking time to come for your Medicare Wellness Visit. I appreciate your ongoing commitment to your health goals. Please review the following plan we discussed and let me know if I can assist you in the future.   Screening recommendations/referrals: Colonoscopy: done 11/13/13. A referral has been sent to gastroenterology for repeat colonoscopy screening. Their office will contact you to schedule.  Recommended yearly ophthalmology/optometry visit for glaucoma screening and checkup Recommended yearly dental visit for hygiene and checkup  Vaccinations: Influenza vaccine: done 09/23/18 Pneumococcal vaccine: done 06/13/17 Tdap vaccine: done 06/24/14 Shingles vaccine: Shingrix discussed. Please contact your pharmacy for coverage information.   Advanced directives: Advance directive discussed with you today. I have provided a copy for you to complete at home and have notarized. Once this is complete please bring a copy in to our office so we can scan it into your chart.  Conditions/risks identified: If you wish to quit smoking, help is available. For free tobacco cessation program offerings call the [facility] at [phone] or Live Well Line at (365) 494-1101. You may also visit [email or web address] or email [email or web address] for more information on other programs.   Next appointment: Please follow up in one year for your Medicare Annual Wellness visit.    Preventive Care 2 Years and Older, Male Preventive care refers to lifestyle choices and visits with your health care provider that can promote health and wellness. What does preventive care include?  A yearly physical exam. This is also called an annual well check.  Dental exams once or twice a year.  Routine eye exams. Ask your health care provider how often you should have your eyes checked.  Personal lifestyle choices, including:  Daily care of your teeth and gums.  Regular physical  activity.  Eating a healthy diet.  Avoiding tobacco and drug use.  Limiting alcohol use.  Practicing safe sex.  Taking low doses of aspirin every day.  Taking vitamin and mineral supplements as recommended by your health care provider. What happens during an annual well check? The services and screenings done by your health care provider during your annual well check will depend on your age, overall health, lifestyle risk factors, and family history of disease. Counseling  Your health care provider may ask you questions about your:  Alcohol use.  Tobacco use.  Drug use.  Emotional well-being.  Home and relationship well-being.  Sexual activity.  Eating habits.  History of falls.  Memory and ability to understand (cognition).  Work and work Statistician. Screening  You may have the following tests or measurements:  Height, weight, and BMI.  Blood pressure.  Lipid and cholesterol levels. These may be checked every 5 years, or more frequently if you are over 47 years old.  Skin check.  Lung cancer screening. You may have this screening every year starting at age 41 if you have a 30-pack-year history of smoking and currently smoke or have quit within the past 15 years.  Fecal occult blood test (FOBT) of the stool. You may have this test every year starting at age 65.  Flexible sigmoidoscopy or colonoscopy. You may have a sigmoidoscopy every 5 years or a colonoscopy every 10 years starting at age 30.  Prostate cancer screening. Recommendations will vary depending on your family history and other risks.  Hepatitis C blood test.  Hepatitis B blood test.  Sexually transmitted disease (STD) testing.  Diabetes screening. This is done by checking your blood sugar (glucose)  after you have not eaten for a while (fasting). You may have this done every 1-3 years.  Abdominal aortic aneurysm (AAA) screening. You may need this if you are a current or former  smoker.  Osteoporosis. You may be screened starting at age 25 if you are at high risk. Talk with your health care provider about your test results, treatment options, and if necessary, the need for more tests. Vaccines  Your health care provider may recommend certain vaccines, such as:  Influenza vaccine. This is recommended every year.  Tetanus, diphtheria, and acellular pertussis (Tdap, Td) vaccine. You may need a Td booster every 10 years.  Zoster vaccine. You may need this after age 6.  Pneumococcal 13-valent conjugate (PCV13) vaccine. One dose is recommended after age 34.  Pneumococcal polysaccharide (PPSV23) vaccine. One dose is recommended after age 66. Talk to your health care provider about which screenings and vaccines you need and how often you need them. This information is not intended to replace advice given to you by your health care provider. Make sure you discuss any questions you have with your health care provider. Document Released: 01/07/2016 Document Revised: 08/30/2016 Document Reviewed: 10/12/2015 Elsevier Interactive Patient Education  2017 [employer] Prevention in the Home Falls can cause injuries. They can happen to people of all ages. There are many things you can do to make your home safe and to help prevent falls. What can I do on the outside of my home?  Regularly fix the edges of walkways and driveways and fix any cracks.  Remove anything that might make you trip as you walk through a door, such as a raised step or threshold.  Trim any bushes or trees on the path to your home.  Use bright outdoor lighting.  Clear any walking paths of anything that might make someone trip, such as rocks or tools.  Regularly check to see if handrails are loose or broken. Make sure that both sides of any steps have handrails.  Any raised decks and porches should have guardrails on the edges.  Have any leaves, snow, or ice cleared regularly.  Use sand or  salt on walking paths during winter.  Clean up any spills in your garage right away. This includes oil or grease spills. What can I do in the bathroom?  Use night lights.  Install grab bars by the toilet and in the tub and shower. Do not use towel bars as grab bars.  Use non-skid mats or decals in the tub or shower.  If you need to sit down in the shower, use a plastic, non-slip stool.  Keep the floor dry. Clean up any water that spills on the floor as soon as it happens.  Remove soap buildup in the tub or shower regularly.  Attach bath mats securely with double-sided non-slip rug tape.  Do not have throw rugs and other things on the floor that can make you trip. What can I do in the bedroom?  Use night lights.  Make sure that you have a light by your bed that is easy to reach.  Do not use any sheets or blankets that are too big for your bed. They should not hang down onto the floor.  Have a firm chair that has side arms. You can use this for support while you get dressed.  Do not have throw rugs and other things on the floor that can make you trip. What can I do in the kitchen?  Clean up any spills right away.  Avoid walking on wet floors.  Keep items that you use a lot in easy-to-reach places.  If you need to reach something above you, use a strong step stool that has a grab bar.  Keep electrical cords out of the way.  Do not use floor polish or wax that makes floors slippery. If you must use wax, use non-skid floor wax.  Do not have throw rugs and other things on the floor that can make you trip. What can I do with my stairs?  Do not leave any items on the stairs.  Make sure that there are handrails on both sides of the stairs and use them. Fix handrails that are broken or loose. Make sure that handrails are as long as the stairways.  Check any carpeting to make sure that it is firmly attached to the stairs. Fix any carpet that is loose or worn.  Avoid having  throw rugs at the top or bottom of the stairs. If you do have throw rugs, attach them to the floor with carpet tape.  Make sure that you have a light switch at the top of the stairs and the bottom of the stairs. If you do not have them, ask someone to add them for you. What else can I do to help prevent falls?  Wear shoes that:  Do not have high heels.  Have rubber bottoms.  Are comfortable and fit you well.  Are closed at the toe. Do not wear sandals.  If you use a stepladder:  Make sure that it is fully opened. Do not climb a closed stepladder.  Make sure that both sides of the stepladder are locked into place.  Ask someone to hold it for you, if possible.  Clearly mark and make sure that you can see:  Any grab bars or handrails.  First and last steps.  Where the edge of each step is.  Use tools that help you move around (mobility aids) if they are needed. These include:  Canes.  Walkers.  Scooters.  Crutches.  Turn on the lights when you go into a dark area. Replace any light bulbs as soon as they burn out.  Set up your furniture so you have a clear path. Avoid moving your furniture around.  If any of your floors are uneven, fix them.  If there are any pets around you, be aware of where they are.  Review your medicines with your doctor. Some medicines can make you feel dizzy. This can increase your chance of falling. Ask your doctor what other things that you can do to help prevent falls. This information is not intended to replace advice given to you by your health care provider. Make sure you discuss any questions you have with your health care provider. Document Released: 10/07/2009 Document Revised: 05/18/2016 Document Reviewed: 01/15/2015 Elsevier Interactive Patient Education  2017 [employer].

## 2019-06-13 NOTE — Progress Notes (Signed)
Name: Jerry Pena   MRN: [id number]    DOB: 07-Jan-1951   Date:06/13/2019       Progress Note  Subjective  Chief Complaint  Chief Complaint  Patient presents with  . Follow-up    HPI  Hyperlipidemia Had mildly elevated triglycerides last year.  Occasionally eats fried foods- mostly uses the oven. Gets at least 4 servings of vegetables a day. Avoids red meat- eats it rarely Lab Results  Component Value Date   CHOL 168 06/14/2018   HDL 54 06/14/2018   LDLCALC 86 06/14/2018   TRIG 184 (H) 06/14/2018   CHOLHDL 3.1 06/14/2018    Prostate cancer Follows up with Dr. Erlene Quan (urology) & Dr. Baruch Gouty (oncology). He had a radical robotic prostatectomy on 11/2017 had trialed some radiation afterwards but stopped due to side effects. His last PSA was undetectable.   Degenerartive disc disease Takes calcium and vitamin D supplements daily, takes aceteaminophen as needed for pain.   Neutropenia Follows up with Dr. Tasia Catchings for microcytic anemia and neutropenia- last seen 10/04/2018. Noted to have beta thalaseemia trait and suggested to avoid iron supplementation. And start taking folic acid 400mg  daily with repeat labs in a year.   PHQ2/9: Depression screen [facility] 2/9 06/13/2019 12/16/2018 06/14/2018 06/07/2018 05/30/2018  Decreased Interest 0 0 0 0 0  Down, Depressed, Hopeless 0 0 1 0 0  PHQ - 2 Score 0 0 1 0 0  Altered sleeping 0 0 1 0 -  Tired, decreased energy 1 0 1 0 -  Change in appetite 0 0 0 0 -  Feeling bad or failure about yourself  0 0 0 0 -  Trouble concentrating 0 0 0 0 -  Moving slowly or fidgety/restless 0 0 0 0 -  Suicidal thoughts 0 0 0 0 -  PHQ-9 Score 1 0 3 0 -  Difficult doing work/chores Not difficult at all Not difficult at all Somewhat difficult Not difficult at all -     PHQ reviewed. Negative  Patient Active Problem List   Diagnosis Date Noted  . Migraine 01/23/2019  . Cataract 01/23/2019  . Beta thalassemia trait 09/27/2018  . Anemia 09/23/2018  . Microcytosis  09/23/2018  . Neutropenia ([place]) 09/23/2018  . History of hormone therapy 06/07/2018  . Prostate cancer ([place]) 12/10/2017  . Elevated PSA 09/12/2017  . Medication monitoring encounter 06/13/2017  . Facet hypertrophy of lumbar region 06/13/2017  . DDD (degenerative disc disease), lumbar 06/13/2017  . Preventative health care 09/28/2015  . Hyperlipidemia 09/09/2015  . Sciatica 09/09/2015  . Arthritis, lumbar spine 09/09/2015  . Tobacco abuse 09/09/2015  . History of colon polyps 09/09/2015    Past Medical History:  Diagnosis Date  . Beta thalassemia trait 09/27/2018  . Cancer ([place]) 10/2017   Prostate  . DDD (degenerative disc disease), lumbar 06/13/2017   Lumbar imaging June 2018  . Elevated PSA 09/12/2017   Refer to urologist, Sept 2018  . Facet hypertrophy of lumbar region 06/13/2017   Lumbar imaging June 2018  . Hyperlipidemia     Past Surgical History:  Procedure Laterality Date  . CATARACT EXTRACTION W/ INTRAOCULAR LENS IMPLANT Left 3/ 28/2014   eyes  . CATARACT EXTRACTION W/ INTRAOCULAR LENS IMPLANT Right 04/23/2013  . PELVIC LYMPH NODE DISSECTION N/A 12/10/2017   Procedure: PELVIC LYMPH NODE DISSECTION;  Surgeon: [name], MD;  Location: [facility] ORS;  Service: Urology;  Laterality: N/A;  . PROSTATE SURGERY    . ROBOT ASSISTED LAPAROSCOPIC RADICAL PROSTATECTOMY N/A 12/10/2017  Procedure: ROBOTIC ASSISTED LAPAROSCOPIC RADICAL PROSTATECTOMY;  Surgeon: [name], MD;  Location: [facility] ORS;  Service: Urology;  Laterality: N/A;    Social History   Tobacco Use  . Smoking status: Light Tobacco Smoker    Years: 48.00    Types: Cigars  . Smokeless tobacco: Never Used  . Tobacco comment: smokes 3 small cigars per day  Substance Use Topics  . Alcohol use: Yes    Alcohol/week: 0.0 standard drinks    Comment: beer rarely     Current Outpatient Medications:  .  acetaminophen (TYLENOL) 500 MG tablet, Take 1,000 mg by mouth every 6 (six) hours as needed for moderate  pain or headache. , Disp: , Rfl:  .  calcium-vitamin D (OSCAL WITH D) 500-200 MG-UNIT tablet, Take 1 tablet by mouth., Disp: , Rfl:  .  docusate sodium (COLACE) 100 MG capsule, Take 1 capsule (100 mg total) by mouth 2 (two) times daily. (Patient taking differently: Take 100 mg by mouth daily as needed. ), Disp: 60 capsule, Rfl: 0 .  folic acid (FOLVITE) 622 MCG tablet, Take 1 tablet (400 mcg total) by mouth daily., Disp: 90 tablet, Rfl: 4 .  ibuprofen (ADVIL,MOTRIN) 200 MG tablet, Take 400 mg by mouth every 6 (six) hours as needed for headache or moderate pain., Disp: , Rfl:  .  Melatonin 5 MG TABS, Take 5 mg by mouth daily as needed., Disp: , Rfl:  .  Multiple Vitamin (MULTIVITAMIN) tablet, Take 1 tablet by mouth daily., Disp: , Rfl:  .  vitamin B-12 (CYANOCOBALAMIN) 100 MCG tablet, Take 100 mcg by mouth daily., Disp: , Rfl:  .  vitamin C (ASCORBIC ACID) 500 MG tablet, Take 500 mg by mouth daily., Disp: , Rfl:   Allergies  Allergen Reactions  . Aspirin Other (See Comments)    Upset stomach, indigestion and heart burn  . Penicillins Hives and Other (See Comments)    Has patient had a PCN reaction causing immediate rash, facial/tongue/throat swelling, SOB or lightheadedness with hypotension: Yes Has patient had a PCN reaction causing severe rash involving mucus membranes or skin necrosis: No Has patient had a PCN reaction that required hospitalization: Yes Has patient had a PCN reaction occurring within the last 10 years: No If all of the above answers are "NO", then may proceed with Cephalosporin use.     Review of Systems  Constitutional: Negative for chills, fever and malaise/fatigue.  HENT: Negative for congestion, sinus pain and sore throat.   Eyes: Negative for blurred vision and double vision.  Respiratory: Negative for cough and shortness of breath.   Cardiovascular: Negative for chest pain, palpitations and leg swelling.  Gastrointestinal: Positive for constipation (takes  colace). Negative for abdominal pain, diarrhea and nausea.  Genitourinary: Negative for dysuria.       Does have some incontinence since radiation   Musculoskeletal: Negative for falls and joint pain.  Skin: Negative for rash.  Neurological: Negative for dizziness and headaches.  Endo/Heme/Allergies: Negative for polydipsia.  Psychiatric/Behavioral: The patient is not nervous/anxious and does not have insomnia.      No other specific complaints in a complete review of systems (except as listed in HPI above).  Objective  Vitals:   06/13/19 1058  BP: 106/66  Pulse: 61  Resp: 14  Temp: 97.6 F (36.4 C)  SpO2: 99%  Weight: 135 lb 1.6 oz (61.3 kg)  Height: 5\' 8"  (1.727 m)    Body mass index is 20.54 kg/m.  Nursing Note and Vital Signs  reviewed.  Physical Exam Constitutional:      Appearance: Normal appearance.  HENT:     Head: Normocephalic and atraumatic.  Eyes:     Conjunctiva/sclera: Conjunctivae normal.     Pupils: Pupils are equal, round, and reactive to light.  Neck:     Musculoskeletal: Normal range of motion.  Cardiovascular:     Rate and Rhythm: Normal rate.  Pulmonary:     Effort: Pulmonary effort is normal.  Musculoskeletal: Normal range of motion.  Skin:    General: Skin is dry.     Findings: No rash.  Neurological:     General: No focal deficit present.     Mental Status: He is alert and oriented to person, place, and time.  Psychiatric:        Mood and Affect: Mood normal.        Behavior: Behavior normal.       No results found for this or any previous visit (from the past 48 hour(s)).  Assessment & Plan  1. Mixed hyperlipidemia Will recheck today  - Lipid Profile  2. DDD (degenerative disc disease), lumbar Improving pain states takes tylenol maybe once a week.   3. Prostate cancer (HCC) Last PSA 4 months ago was undetectable  4. Neutropenia, unspecified type ([place]) Follow up with oncology   5. Screening for colon cancer Referred  to GI   6. Screening for diabetes mellitus - COMPLETE METABOLIC PANEL WITH GFR

## 2019-06-13 NOTE — Progress Notes (Signed)
Subjective:   Jerry Pena is a 68 y.o. male who presents for Medicare Annual/Subsequent preventive examination.  Review of Systems:   Cardiac Risk Factors include: advanced age (>26men, >33 women);male gender     Objective:    Vitals: BP 106/66 (BP Location: Right Arm, Patient Position: Sitting, Cuff Size: Normal)   Pulse 61   Temp 97.6 F (36.4 C) (Oral)   Resp 16   Ht 5\' 8"  (1.727 m)   Wt 135 lb 1.6 oz (61.3 kg)   SpO2 99%   BMI 20.54 kg/m   Body mass index is 20.54 kg/m.  Advanced Directives 06/13/2019 01/09/2019 10/04/2018 09/25/2018 06/07/2018 05/30/2018 12/10/2017  Does Patient Have a Medical Advance Directive? No Yes Yes Yes Yes Yes Yes  Type of Advance Directive - [place];Living will - [place];Living will [place];Living will [place];Living will [place];Living will  Does patient want to make changes to medical advance directive? - - - - - No - Patient declined No - Patient declined  Copy of [place] in Chart? - - - No - copy requested No - copy requested No - copy requested No - copy requested  Would patient like information on creating a medical advance directive? Yes (MAU/Ambulatory/Procedural Areas - Information given) - - - - - -    Tobacco Social History   Tobacco Use  Smoking Status Light Tobacco Smoker  . Years: 48.00  . Types: Cigars  Smokeless Tobacco Never Used  Tobacco Comment   smokes 3 small cigars per day     Ready to quit: Yes Counseling given: Yes Comment: smokes 3 small cigars per day   Clinical Intake:  Pre-visit preparation completed: Yes  Pain : No/denies pain     BMI - recorded: 20.54 Nutritional Status: BMI of 19-24  Normal Nutritional Risks: None Diabetes: No  How often do you need to have someone help you when you read instructions, pamphlets, or other written materials from your doctor or pharmacy?: 1 -  Never  Interpreter Needed?: No  Information entered by :: Clemetine Marker LPN  Past Medical History:  Diagnosis Date  . Beta thalassemia trait 09/27/2018  . Cancer ([place]) 10/2017   Prostate  . DDD (degenerative disc disease), lumbar 06/13/2017   Lumbar imaging June 2018  . Elevated PSA 09/12/2017   Refer to urologist, Sept 2018  . Facet hypertrophy of lumbar region 06/13/2017   Lumbar imaging June 2018  . Hyperlipidemia    Past Surgical History:  Procedure Laterality Date  . CATARACT EXTRACTION W/ INTRAOCULAR LENS IMPLANT Left 3/ 28/2014   eyes  . CATARACT EXTRACTION W/ INTRAOCULAR LENS IMPLANT Right 04/23/2013  . PELVIC LYMPH NODE DISSECTION N/A 12/10/2017   Procedure: PELVIC LYMPH NODE DISSECTION;  Surgeon: [name], MD;  Location: [facility] ORS;  Service: Urology;  Laterality: N/A;  . PROSTATE SURGERY    . ROBOT ASSISTED LAPAROSCOPIC RADICAL PROSTATECTOMY N/A 12/10/2017   Procedure: ROBOTIC ASSISTED LAPAROSCOPIC RADICAL PROSTATECTOMY;  Surgeon: [name], MD;  Location: [facility] ORS;  Service: Urology;  Laterality: N/A;   Family History  Problem Relation Age of Onset  . Stroke Mother   . Stroke Father   . Cancer Sister        breast, lung  . Pulmonary Hypertension Sister   . Cancer Son        prostate  . Prostate cancer Son   . Hypertension Sister   . Bladder  Cancer Neg Hx   . Kidney cancer Neg Hx    Social History   Socioeconomic History  . Marital status: Divorced    Spouse name: Not on file  . Number of children: 4  . Years of education: Not on file  . Highest education level: Associate degree: academic program  Occupational History  . Occupation: Retired  Scientific laboratory technician  . Financial resource strain: Not hard at all  . Food insecurity    Worry: Never true    Inability: Never true  . Transportation needs    Medical: No    Non-medical: No  Tobacco Use  . Smoking status: Light Tobacco Smoker    Years: 48.00    Types: Cigars  . Smokeless tobacco: Never  Used  . Tobacco comment: smokes 3 small cigars per day  Substance and Sexual Activity  . Alcohol use: Yes    Alcohol/week: 0.0 standard drinks    Comment: beer rarely  . Drug use: No    Comment: used marijuana more than 10 years ago  . Sexual activity: Not Currently  Lifestyle  . Physical activity    Days per week: 7 days    Minutes per session: 90 min  . Stress: Only a little  Relationships  . Social connections    Talks on phone: More than three times a week    Gets together: Twice a week    Attends religious service: More than 4 times per year    Active member of club or organization: No    Attends meetings of clubs or organizations: Never    Relationship status: Divorced  Other Topics Concern  . Not on file  Social History Narrative   Patient just buried his baby sister, Diane,10/2018    Outpatient Encounter Medications as of 06/13/2019  Medication Sig  . acetaminophen (TYLENOL) 500 MG tablet Take 1,000 mg by mouth every 6 (six) hours as needed for moderate pain or headache.   . calcium-vitamin D (OSCAL WITH D) 500-200 MG-UNIT tablet Take 1 tablet by mouth.  . docusate sodium (COLACE) 100 MG capsule Take 1 capsule (100 mg total) by mouth 2 (two) times daily. (Patient taking differently: Take 100 mg by mouth daily as needed. )  . folic acid (FOLVITE) 287 MCG tablet Take 1 tablet (400 mcg total) by mouth daily.  Marland Kitchen ibuprofen (ADVIL,MOTRIN) 200 MG tablet Take 400 mg by mouth every 6 (six) hours as needed for headache or moderate pain.  . Melatonin 5 MG TABS Take 5 mg by mouth daily as needed.  . Multiple Vitamin (MULTIVITAMIN) tablet Take 1 tablet by mouth daily.  . vitamin B-12 (CYANOCOBALAMIN) 100 MCG tablet Take 100 mcg by mouth daily.  . vitamin C (ASCORBIC ACID) 500 MG tablet Take 500 mg by mouth daily.  . [DISCONTINUED] AMBULATORY NON FORMULARY MEDICATION Medication Name: Compounded Medication  . [DISCONTINUED] mirabegron ER (MYRBETRIQ) 25 MG TB24 tablet Take 1 tablet  (25 mg total) by mouth daily.   No facility-administered encounter medications on file as of 06/13/2019.     Activities of Daily Living In your present state of health, do you have any difficulty performing the following activities: 06/13/2019 12/16/2018  Hearing? N N  Comment declines hearing aids -  Vision? N Y  Comment - patient stated that he needs to have his eyes checked  Difficulty concentrating or making decisions? N N  Walking or climbing stairs? N N  Dressing or bathing? N N  Doing errands, shopping? N N  Preparing Food and eating ? N -  Using the Toilet? N -  In the past six months, have you accidently leaked urine? Y -  Comment prostate removed 10/2017 -  Do you have problems with loss of bowel control? N -  Managing your Medications? N -  Managing your Finances? N -  Housekeeping or managing your Housekeeping? N -  Some recent data might be hidden    Patient Care Team: Lada, Satira Anis, MD as PCP - General (Family Medicine) [name], MD as Consulting Physician (Urology) Noreene Filbert, MD as Consulting Physician (Radiation Oncology)   Assessment:   This is a routine wellness examination for [place].  Exercise Activities and Dietary recommendations Current Exercise Habits: Home exercise routine, Type of exercise: strength training/weights;walking, Frequency (Times/Week): 7, Intensity: Moderate, Exercise limited by: None identified  Goals    . DIET - INCREASE WATER INTAKE     Recommend to drink at least 6-8 8oz glasses of water per day.    . Quit Smoking     Pt started smoking again as a result of dealing with urinary incontinence from prostate surgery and would like to be able to quit.   If you wish to quit smoking, help is available. For free tobacco cessation program offerings call the [facility] at (801)346-4537 or Live Well Line at [phone]. You may also visit [email or web address] or email [email or web address] [email or web address] for more  information on other programs.         Fall Risk Fall Risk  06/13/2019 12/16/2018 06/14/2018 06/07/2018 05/30/2018  Falls in the past year? 0 0 No No No  Number falls in past yr: 0 0 - - -  Injury with Fall? 0 0 - - -  Risk for fall due to : - - - Other (Comment) -  Risk for fall due to: Comment - - - sciatica -  Follow up Falls prevention discussed - - - -   FALL RISK PREVENTION PERTAINING TO THE HOME:  Any stairs in or around the home? Yes  If so, do they handrails? Yes   Home free of loose throw rugs in walkways, pet beds, electrical cords, etc? Yes  Adequate lighting in your home to reduce risk of falls? Yes   ASSISTIVE DEVICES UTILIZED TO PREVENT FALLS:  Life alert? No  Use of a cane, walker or w/c? No  Grab bars in the bathroom? Yes  Shower chair or bench in shower? No  Elevated toilet seat or a handicapped toilet? No   DME ORDERS:  DME order needed?  No   TIMED UP AND GO:  Was the test performed? Yes .  Length of time to ambulate 10 feet: 5 sec.   GAIT:  Appearance of gait: Gait stead-fast and without the use of an assistive device.  Education: Fall risk prevention has been discussed.  Intervention(s) required? No   Depression Screen PHQ 2/9 Scores 06/13/2019 12/16/2018 06/14/2018 06/07/2018  PHQ - 2 Score 0 0 1 0  PHQ- 9 Score 1 0 3 0    Cognitive Function     6CIT Screen 06/13/2019 06/07/2018  What Year? 0 points 0 points  What month? 0 points 0 points  What time? 0 points 0 points  Count back from 20 0 points 0 points  Months in reverse 0 points 0 points  Repeat phrase 0 points 8 points  Total Score 0 8    Immunization History  Administered Date(s) Administered  . Influenza, High Dose Seasonal  PF 09/03/2017, 09/23/2018  . Influenza,inj,Quad PF,6+ Mos 09/09/2015  . Pneumococcal Conjugate-13 06/13/2017  . Pneumococcal Polysaccharide-23 09/30/2012, 06/24/2014  . Td 06/24/2014  . Tdap 01/21/2013  . Zoster 03/17/2013    Qualifies for Shingles  Vaccine? Yes  Zostavax completed 2014. Due for Shingrix. Education has been provided regarding the importance of this vaccine. Pt has been advised to call insurance company to determine out of pocket expense. Advised may also receive vaccine at local pharmacy or Health Dept. Verbalized acceptance and understanding.  Tdap: Up to date  Flu Vaccine: Up to date  Pneumococcal Vaccine: Up to date   Screening Tests Health Maintenance  Topic Date Due  . COLONOSCOPY  11/14/2015  . PNA vac Low Risk Adult (2 of 2 - PPSV23) 06/25/2019  . INFLUENZA VACCINE  07/26/2019  . TETANUS/TDAP  06/24/2024  . Hepatitis C Screening  Completed   Cancer Screenings:  Colorectal Screening: Completed 11/13/13. Repeat every 2 years. Referral to GI placed today. Pt aware the office will call re: appt. Pt was referred to GI last year and had to postpone colonoscopy due to radiation treatments.   Lung Cancer Screening: (Low Dose CT Chest recommended if Age 49-80 years, 30 pack-year currently smoking OR have quit w/in 15years.) does not qualify.   Additional Screening:  Hepatitis C Screening: does qualify; Completed 09/03/17  Vision Screening: Recommended annual ophthalmology exams for early detection of glaucoma and other disorders of the eye. Is the patient up to date with their annual eye exam?  No  Who is the provider or what is the name of the office in which the pt attends annual eye exams? [facility]  Dental Screening: Recommended annual dental exams for proper oral hygiene  Community Resource Referral:  CRR required this visit?  No       Plan:   I have personally reviewed and addressed the Medicare Annual Wellness questionnaire and have noted the following in the patient's chart:  A. Medical and social history B. Use of alcohol, tobacco or illicit drugs  C. Current medications and supplements D. Functional ability and status E.  Nutritional status F.  Physical activity G. Advance directives H.  List of other physicians I.  Hospitalizations, surgeries, and ER visits in previous 12 months J.  [place] such as hearing and vision if needed, cognitive and depression L. Referrals and appointments   In addition, I have reviewed and discussed with patient certain preventive protocols, quality metrics, and best practice recommendations. A written personalized care plan for preventive services as well as general preventive health recommendations were provided to patient.   Signed,  Clemetine Marker, LPN Nurse Health Advisor   Nurse Notes: pt c/o frustration with urinary incontinence and leakage due to prostate removal 10/2017. He has an appt with urology in August to follow up and does not want any additional surgery.

## 2019-06-14 LAB — COMPLETE METABOLIC PANEL WITH GFR
AG Ratio: 1.6 (calc) (ref 1.0–2.5)
ALT: 9 U/L (ref 9–46)
AST: 15 U/L (ref 10–35)
Albumin: 4 g/dL (ref 3.6–5.1)
Alkaline phosphatase (APISO): 71 U/L (ref 35–144)
BUN: 13 mg/dL (ref 7–25)
CO2: 28 mmol/L (ref 20–32)
Calcium: 8.9 mg/dL (ref 8.6–10.3)
Chloride: 107 mmol/L (ref 98–110)
Creat: 0.91 mg/dL (ref 0.70–1.25)
GFR, Est African American: 99 mL/min/{1.73_m2} (ref >=60)
GFR, Est Non African American: 86 mL/min/{1.73_m2} (ref >=60)
Globulin: 2.5 g/dL (calc) (ref 1.9–3.7)
Glucose, Bld: 87 mg/dL (ref 65–99)
Potassium: 4.5 mmol/L (ref 3.5–5.3)
Sodium: 142 mmol/L (ref 135–146)
Total Bilirubin: 0.5 mg/dL (ref 0.2–1.2)
Total Protein: 6.5 g/dL (ref 6.1–8.1)

## 2019-06-14 LAB — LIPID PANEL
Cholesterol: 157 mg/dL (ref <200)
HDL: 61 mg/dL (ref >=40)
LDL Cholesterol (Calc): 77 mg/dL (calc)
Non-HDL Cholesterol (Calc): 96 mg/dL (calc) (ref <130)
Total CHOL/HDL Ratio: 2.6 (calc) (ref <5.0)
Triglycerides: 104 mg/dL (ref <150)

## 2019-07-08 ENCOUNTER — Telehealth: Payer: Self-pay | Admitting: Gastroenterology

## 2019-07-08 NOTE — Telephone Encounter (Signed)
Pt is calling he states he has a referral from his PCP to get a colonoscopy done.

## 2019-07-09 ENCOUNTER — Other Ambulatory Visit: Payer: Self-pay

## 2019-07-09 DIAGNOSIS — Z86010 Personal history of colonic polyps: Secondary | ICD-10-CM

## 2019-07-09 DIAGNOSIS — Z1211 Encounter for screening for malignant neoplasm of colon: Secondary | ICD-10-CM

## 2019-07-09 NOTE — Telephone Encounter (Signed)
Gastroenterology Pre-Procedure Review  Request Date: 07/28/19 Requesting Physician: Dr. Vicente Males  PATIENT REVIEW QUESTIONS: The patient responded to the following health history questions as indicated:    1. Are you having any GI issues? no 2. Do you have a personal history of Polyps? yes (2014 Colonoscopy performed at the [facility] noted) 3. Do you have a family history of Colon Cancer or Polyps? no 4. Diabetes Mellitus? no 5. Joint replacements in the past 12 months?No surgeries in the past year however prostate surgery in Dec 2018 6. Major health problems in the past 3 months?no 7. Any artificial heart valves, MVP, or defibrillator?no    MEDICATIONS & ALLERGIES:    Patient reports the following regarding taking any anticoagulation/antiplatelet therapy:   Plavix, Coumadin, Eliquis, Xarelto, Lovenox, Pradaxa, Brilinta, or Effient? no Aspirin? no  Patient confirms/reports the following medications:  Current Outpatient Medications  Medication Sig Dispense Refill  . acetaminophen (TYLENOL) 500 MG tablet Take 1,000 mg by mouth every 6 (six) hours as needed for moderate pain or headache.     . calcium-vitamin D (OSCAL WITH D) 500-200 MG-UNIT tablet Take 1 tablet by mouth.    . docusate sodium (COLACE) 100 MG capsule Take 1 capsule (100 mg total) by mouth 2 (two) times daily. (Patient taking differently: Take 100 mg by mouth daily as needed. ) 60 capsule 0  . folic acid (FOLVITE) 878 MCG tablet Take 1 tablet (400 mcg total) by mouth daily. 90 tablet 4  . ibuprofen (ADVIL,MOTRIN) 200 MG tablet Take 400 mg by mouth every 6 (six) hours as needed for headache or moderate pain.    . Melatonin 5 MG TABS Take 5 mg by mouth daily as needed.    . Multiple Vitamin (MULTIVITAMIN) tablet Take 1 tablet by mouth daily.    . vitamin B-12 (CYANOCOBALAMIN) 100 MCG tablet Take 100 mcg by mouth daily.    . vitamin C (ASCORBIC ACID) 500 MG tablet Take 500 mg by mouth daily.     No current  facility-administered medications for this visit.     Patient confirms/reports the following allergies:  Allergies  Allergen Reactions  . Aspirin Other (See Comments)    Upset stomach, indigestion and heart burn  . Penicillins Hives and Other (See Comments)    Has patient had a PCN reaction causing immediate rash, facial/tongue/throat swelling, SOB or lightheadedness with hypotension: Yes Has patient had a PCN reaction causing severe rash involving mucus membranes or skin necrosis: No Has patient had a PCN reaction that required hospitalization: Yes Has patient had a PCN reaction occurring within the last 10 years: No If all of the above answers are "NO", then may proceed with Cephalosporin use.     No orders of the defined types were placed in this encounter.   AUTHORIZATION INFORMATION Primary Insurance: 1D#: Group #:  Secondary Insurance: 1D#: Group #:  SCHEDULE INFORMATION: Date: 07/28/19 Time: Location:[facility]

## 2019-07-17 ENCOUNTER — Other Ambulatory Visit: Payer: Self-pay

## 2019-07-17 ENCOUNTER — Inpatient Hospital Stay: Payer: Medicare HMO | Attending: Oncology

## 2019-07-17 DIAGNOSIS — Z8546 Personal history of malignant neoplasm of prostate: Secondary | ICD-10-CM | POA: Insufficient documentation

## 2019-07-17 DIAGNOSIS — C61 Malignant neoplasm of prostate: Secondary | ICD-10-CM

## 2019-07-17 DIAGNOSIS — Z923 Personal history of irradiation: Secondary | ICD-10-CM | POA: Diagnosis not present

## 2019-07-17 LAB — PSA: Prostatic Specific Antigen: <0.01 ng/mL (ref 0.00–4.00)

## 2019-07-23 ENCOUNTER — Other Ambulatory Visit: Payer: Self-pay

## 2019-07-24 ENCOUNTER — Other Ambulatory Visit
Admission: RE | Admit: 2019-07-24 | Discharge: 2019-07-24 | Disposition: A | Discharge status: Home / Self Care | Payer: Medicare HMO | Source: Ambulatory Visit | Attending: Gastroenterology | Admitting: Gastroenterology

## 2019-07-24 ENCOUNTER — Other Ambulatory Visit: Payer: Self-pay

## 2019-07-24 ENCOUNTER — Ambulatory Visit
Admission: RE | Admit: 2019-07-24 | Discharge: 2019-07-24 | Disposition: A | Discharge status: Home / Self Care | Payer: Medicare HMO | Source: Ambulatory Visit | Attending: Radiation Oncology | Admitting: Radiation Oncology

## 2019-07-24 ENCOUNTER — Encounter: Payer: Self-pay | Admitting: Radiation Oncology

## 2019-07-24 ENCOUNTER — Telehealth: Payer: Self-pay | Admitting: Gastroenterology

## 2019-07-24 VITALS — BP 111/79 | HR 73 | Temp 96.3°F | Resp 18 | Wt 135.1 lb

## 2019-07-24 DIAGNOSIS — Z923 Personal history of irradiation: Secondary | ICD-10-CM | POA: Diagnosis not present

## 2019-07-24 DIAGNOSIS — Z20828 Contact with and (suspected) exposure to other viral communicable diseases: Secondary | ICD-10-CM | POA: Insufficient documentation

## 2019-07-24 DIAGNOSIS — C61 Malignant neoplasm of prostate: Secondary | ICD-10-CM

## 2019-07-24 LAB — SARS CORONAVIRUS 2 (TAT 6-24 HRS): SARS Coronavirus 2: NEGATIVE

## 2019-07-24 NOTE — Telephone Encounter (Signed)
Pt left vm he has a procedure on 07/28/19 and has a problem finding someone to stay with him he might need to reschedule please call pt

## 2019-07-24 NOTE — Telephone Encounter (Signed)
Pt is calling again he needs to reschedule his procedure

## 2019-07-24 NOTE — Progress Notes (Signed)
Radiation Oncology Follow up Note  Name: Jerry Pena   Date:   07/24/2019 MRN:  [id number] DOB: Jul 18, 1951    This 68 y.o. male presents to the clinic today for 39-month follow-up status post salvage radiation therapy for Gleason 7 (4+3) adenocarcinoma the prostate status post prostatectomy.  REFERRING PROVIDER: Arnetha Courser, MD  HPI: Patient is a 68 year old male now out 11 months having completed salvage radiation therapy status post robotic assisted prostatectomy for Gleason 7 (4+3) adenocarcinoma the prostate.  He is seen today in routine follow-up and is doing well.  He specifically denies any increased lower urinary tract symptoms diarrhea or fatigue.  His most recent PSA is 0.01 this month..  COMPLICATIONS OF TREATMENT: none  FOLLOW UP COMPLIANCE: keeps appointments   PHYSICAL EXAM:  BP 111/79 (BP Location: Left Arm, Patient Position: Sitting)   Pulse 73   Temp (!) 96.3 F (35.7 C) (Tympanic)   Resp 18   Wt 135 lb 1.6 oz (61.3 kg)   BMI 20.54 kg/m  Well-developed well-nourished patient in NAD. HEENT reveals PERLA, EOMI, discs not visualized.  Oral cavity is clear. No oral mucosal lesions are identified. Neck is clear without evidence of cervical or supraclavicular adenopathy. Lungs are clear to A&P. Cardiac examination is essentially unremarkable with regular rate and rhythm without murmur rub or thrill. Abdomen is benign with no organomegaly or masses noted. Motor sensory and DTR levels are equal and symmetric in the upper and lower extremities. Cranial nerves II through XII are grossly intact. Proprioception is intact. No peripheral adenopathy or edema is identified. No motor or sensory levels are noted. Crude visual fields are within normal range.  RADIOLOGY RESULTS: No current films for review  PLAN: Present time patient is doing well under excellent biochemical control of his prostate cancer.  I am pleased with his overall progress.  I have asked to see him back in 6  months for follow-up with a PSA prior to that visit.  Patient knows to call sooner with any concerns.  I would like to take this opportunity to thank you for allowing me to participate in the care of your patient.Noreene Filbert, MD

## 2019-07-24 NOTE — Telephone Encounter (Signed)
Pt is calling  about prev. message

## 2019-07-24 NOTE — Telephone Encounter (Signed)
Call has been returned.  Patients colonoscopy has been moved from 08/03 to 08/04 with Dr. Vicente Males.  He had COVID test today, and will remain quarantined until after his procedure.  Thanks [employer]

## 2019-07-29 ENCOUNTER — Ambulatory Visit
Admission: RE | Admit: 2019-07-29 | Discharge: 2019-07-29 | Disposition: A | Discharge status: Home / Self Care | Payer: Medicare HMO | Attending: Gastroenterology | Admitting: Gastroenterology

## 2019-07-29 ENCOUNTER — Encounter
Admission: RE | Disposition: A | Discharge status: Home / Self Care | Payer: Self-pay | Source: Home / Self Care | Attending: Gastroenterology

## 2019-07-29 ENCOUNTER — Ambulatory Visit: Payer: Medicare HMO | Admitting: Anesthesiology

## 2019-07-29 ENCOUNTER — Other Ambulatory Visit: Payer: Self-pay

## 2019-07-29 DIAGNOSIS — K627 Radiation proctitis: Secondary | ICD-10-CM | POA: Diagnosis not present

## 2019-07-29 DIAGNOSIS — F17290 Nicotine dependence, other tobacco product, uncomplicated: Secondary | ICD-10-CM | POA: Diagnosis not present

## 2019-07-29 DIAGNOSIS — K5730 Diverticulosis of large intestine without perforation or abscess without bleeding: Secondary | ICD-10-CM | POA: Diagnosis not present

## 2019-07-29 DIAGNOSIS — D563 Thalassemia minor: Secondary | ICD-10-CM | POA: Insufficient documentation

## 2019-07-29 DIAGNOSIS — Z860100 Personal history of colon polyps, unspecified: Secondary | ICD-10-CM

## 2019-07-29 DIAGNOSIS — E785 Hyperlipidemia, unspecified: Secondary | ICD-10-CM | POA: Diagnosis not present

## 2019-07-29 DIAGNOSIS — K635 Polyp of colon: Secondary | ICD-10-CM

## 2019-07-29 DIAGNOSIS — Z923 Personal history of irradiation: Secondary | ICD-10-CM | POA: Insufficient documentation

## 2019-07-29 DIAGNOSIS — D126 Benign neoplasm of colon, unspecified: Secondary | ICD-10-CM | POA: Diagnosis not present

## 2019-07-29 DIAGNOSIS — K5520 Angiodysplasia of colon without hemorrhage: Secondary | ICD-10-CM | POA: Insufficient documentation

## 2019-07-29 DIAGNOSIS — Z1211 Encounter for screening for malignant neoplasm of colon: Secondary | ICD-10-CM | POA: Diagnosis not present

## 2019-07-29 DIAGNOSIS — Z8546 Personal history of malignant neoplasm of prostate: Secondary | ICD-10-CM | POA: Insufficient documentation

## 2019-07-29 HISTORY — PX: COLONOSCOPY WITH PROPOFOL: SHX5780

## 2019-07-29 SURGERY — COLONOSCOPY WITH PROPOFOL
Anesthesia: General

## 2019-07-29 MED ORDER — PHENYLEPHRINE HCL (PRESSORS) 10 MG/ML IV SOLN
INTRAVENOUS | Status: DC | PRN
  Administered 2019-07-29 (×3): 100 ug via INTRAVENOUS

## 2019-07-29 MED ORDER — PROPOFOL 10 MG/ML IV BOLUS
INTRAVENOUS | Status: DC | PRN
  Administered 2019-07-29: 30 mg via INTRAVENOUS
  Administered 2019-07-29 (×2): 50 mg via INTRAVENOUS
  Administered 2019-07-29 (×2): 30 mg via INTRAVENOUS
  Administered 2019-07-29 (×2): 50 mg via INTRAVENOUS

## 2019-07-29 MED ORDER — SODIUM CHLORIDE 0.9 % IV SOLN
INTRAVENOUS | Status: DC
  Administered 2019-07-29: 10:00:00 via INTRAVENOUS

## 2019-07-29 NOTE — Transfer of Care (Addendum)
Immediate Anesthesia Transfer of Care Note  Patient: Jerry Pena  Procedure(s) Performed: COLONOSCOPY WITH PROPOFOL (N/A )   Patient Location: Endoscopy Unit  Anesthesia Type:General  Level of Consciousness: drowsy and patient cooperative  Airway & Oxygen Therapy: Patient Spontanous Breathing and Patient connected to face mask oxygen  Post-op Assessment: Report given to RN and Post -op Vital signs reviewed and stable  Post vital signs: Reviewed and stable  Last Vitals:  Vitals Value Taken Time  BP 114/69 07/29/19 1015  Temp 36.3 C 07/29/19 1014  Pulse 64 07/29/19 1016  Resp 14 07/29/19 1016  SpO2 100 % 07/29/19 1016  Vitals shown include unvalidated device data.  Last Pain:  Vitals:   07/29/19 1014  TempSrc: Tympanic  PainSc: Asleep         Complications: No apparent anesthesia complications

## 2019-07-29 NOTE — Anesthesia Post-op Follow-up Note (Signed)
Anesthesia QCDR form completed.        

## 2019-07-29 NOTE — Anesthesia Preprocedure Evaluation (Signed)
Anesthesia Evaluation  Patient identified by MRN, date of birth, ID band Patient awake    Reviewed: Allergy & Precautions, NPO status , Patient's Chart, lab work & pertinent test results  History of Anesthesia Complications Negative for: history of anesthetic complications  Airway Mallampati: II  TM Distance: >3 FB Neck ROM: Full    Dental  (+) Poor Dentition   Pulmonary neg sleep apnea, neg COPD, Current Smoker,    breath sounds clear to auscultation- rhonchi (-) wheezing      Cardiovascular Exercise Tolerance: Good (-) hypertension(-) CAD, (-) Past MI, (-) Cardiac Stents and (-) CABG  Rhythm:Regular Rate:Normal - Systolic murmurs and - Diastolic murmurs    Neuro/Psych  Headaches, neg Seizures negative psych ROS   GI/Hepatic negative GI ROS, Neg liver ROS,   Endo/Other  negative endocrine ROSneg diabetes  Renal/GU negative Renal ROS     Musculoskeletal  (+) Arthritis ,   Abdominal (+) - obese,   Peds  Hematology  (+) anemia ,   Anesthesia Other Findings Past Medical History: 09/27/2018: Beta thalassemia trait 10/2017: Cancer ([place])     Comment:  Prostate 06/13/2017: DDD (degenerative disc disease), lumbar     Comment:  Lumbar imaging June 2018 09/12/2017: Elevated PSA     Comment:  Refer to urologist, Sept 2018 06/13/2017: Facet hypertrophy of lumbar region     Comment:  Lumbar imaging June 2018 No date: Hyperlipidemia   Reproductive/Obstetrics                             Anesthesia Physical Anesthesia Plan  ASA: II  Anesthesia Plan: General   Post-op Pain Management:    Induction: Intravenous  PONV Risk Score and Plan: 0 and Propofol infusion  Airway Management Planned: Natural Airway  Additional Equipment:   Intra-op Plan:   Post-operative Plan:   Informed Consent: I have reviewed the patients History and Physical, chart, labs and discussed the procedure including the  risks, benefits and alternatives for the proposed anesthesia with the patient or authorized representative who has indicated his/her understanding and acceptance.     Dental advisory given  Plan Discussed with: CRNA and Anesthesiologist  Anesthesia Plan Comments:         Anesthesia Quick Evaluation

## 2019-07-29 NOTE — Op Note (Signed)
[facility] Patient Name: Jerry Pena Procedure Date: 07/29/2019 9:42 AM MRN: [id number] Account #: [id number] Date of Birth: [birth year] Admit Type: Outpatient Age: 68 Room: [facility] ENDO ROOM 1 Gender: Male Note Status: Finalized Procedure:            Colonoscopy Indications:          Surveillance: Personal history of colonic polyps                        (unknown histology) on last colonoscopy more than 5                        years ago Providers:            Lin Landsman MD, MD Medicines:            Monitored Anesthesia Care Complications:        No immediate complications. Estimated blood loss: None. Procedure:            Pre-Anesthesia Assessment:                       - Prior to the procedure, a History and Physical was                        performed, and patient medications and allergies were                        reviewed. The patient is competent. The risks and                        benefits of the procedure and the sedation options and                        risks were discussed with the patient. All questions                        were answered and informed consent was obtained.                        Patient identification and proposed procedure were                        verified by the physician, the nurse, the                        anesthesiologist, the anesthetist and the technician in                        the pre-procedure area in the procedure room in the                        endoscopy suite. Mental Status Examination: alert and                        oriented. Airway Examination: normal oropharyngeal                        airway and neck mobility. Respiratory Examination:  clear to auscultation. CV Examination: normal.                        Prophylactic Antibiotics: The patient does not require                        prophylactic antibiotics. Prior Anticoagulants: The   patient has taken no previous anticoagulant or                        antiplatelet agents. ASA Grade Assessment: II - A                        patient with mild systemic disease. After reviewing the                        risks and benefits, the patient was deemed in                        satisfactory condition to undergo the procedure. The                        anesthesia plan was to use monitored anesthesia care                        (MAC). Immediately prior to administration of                        medications, the patient was re-assessed for adequacy                        to receive sedatives. The heart rate, respiratory rate,                        oxygen saturations, blood pressure, adequacy of                        pulmonary ventilation, and response to care were                        monitored throughout the procedure. The physical status                        of the patient was re-assessed after the procedure.                       After obtaining informed consent, the colonoscope was                        passed under direct vision. Throughout the procedure,                        the patient's blood pressure, pulse, and oxygen                        saturations were monitored continuously. The                        Colonoscope was introduced through the anus and  advanced to the the cecum, identified by appendiceal                        orifice and ileocecal valve. The colonoscopy was                        performed without difficulty. The patient tolerated the                        procedure well. The quality of the bowel preparation                        was evaluated using the BBPS [facility] Bowel Preparation                        Scale) with scores of: Right Colon = 3, Transverse                        Colon = 3 and Left Colon = 3 (entire mucosa seen well                        with no residual staining, small fragments of stool or                         opaque liquid). The total BBPS score equals 9. Findings:      The perianal and digital rectal examinations were normal. Pertinent       negatives include normal sphincter tone and no palpable rectal lesions.      Three sessile polyps were found in the transverse colon and cecum. The       polyps were diminutive in size. These polyps were removed with a cold       biopsy forceps. Resection and retrieval were complete.      A few diverticula were found in the sigmoid colon.      Multiple diffuse angioectasias without bleeding were found in the distal       rectum consistent with radiation proctitis given patient's history of       prostate cancer s/p radiation. These were not treated with APC as       patient did not complain of rectal bleeding      Retroflexion not performed Impression:           - Three diminutive polyps in the transverse colon and                        in the cecum, removed with a cold biopsy forceps.                        Resected and retrieved.                       - Diverticulosis in the sigmoid colon.                       - Multiple non-bleeding colonic angioectasias. Recommendation:       - Discharge patient to home (with escort).                       - Resume previous diet today.                       -  Continue present medications.                       - Await pathology results.                       - Repeat colonoscopy in 5 years for surveillance. Procedure Code(s):    --- Professional ---                       [phone], Colonoscopy, flexible; with biopsy, single or                        multiple Diagnosis Code(s):    --- Professional ---                       Z86.010, Personal history of colonic polyps                       K63.5, Polyp of colon                       K55.20, Angiodysplasia of colon without hemorrhage                       K57.30, Diverticulosis of large intestine without                        perforation or abscess without  bleeding CPT copyright 2019 American Medical Association. All rights reserved. The codes documented in this report are preliminary and upon coder review may  be revised to meet current compliance requirements. Dr. Ulyess Mort Lin Landsman MD, MD 07/29/2019 10:13:10 AM This report has been signed electronically. Number of Addenda: 0 Note Initiated On: 07/29/2019 9:42 AM Scope Withdrawal Time: 0 hours 13 minutes 16 seconds  Total Procedure Duration: 0 hours 18 minutes 12 seconds  Estimated Blood Loss: Estimated blood loss: none.      [facility]

## 2019-07-29 NOTE — H&P (Addendum)
Cephas Darby, MD [address]  [place]  [place], [place] [address]  Main: [phone]  Fax: [phone] Pager: [phone]  Primary Care Physician:  Arnetha Courser, MD Primary Gastroenterologist:  Dr. Cephas Darby  Pre-Procedure History & Physical: HPI:  Grainger Mccarley is a 68 y.o. male is here for an colonoscopy.   Past Medical History:  Diagnosis Date  . Beta thalassemia trait 09/27/2018  . Cancer ([place]) 10/2017   Prostate  . DDD (degenerative disc disease), lumbar 06/13/2017   Lumbar imaging June 2018  . Elevated PSA 09/12/2017   Refer to urologist, Sept 2018  . Facet hypertrophy of lumbar region 06/13/2017   Lumbar imaging June 2018  . Hyperlipidemia     Past Surgical History:  Procedure Laterality Date  . CATARACT EXTRACTION W/ INTRAOCULAR LENS IMPLANT Left 3/ 28/2014   eyes  . CATARACT EXTRACTION W/ INTRAOCULAR LENS IMPLANT Right 04/23/2013  . PELVIC LYMPH NODE DISSECTION N/A 12/10/2017   Procedure: PELVIC LYMPH NODE DISSECTION;  Surgeon: [name], MD;  Location: [facility] ORS;  Service: Urology;  Laterality: N/A;  . PROSTATE SURGERY    . ROBOT ASSISTED LAPAROSCOPIC RADICAL PROSTATECTOMY N/A 12/10/2017   Procedure: ROBOTIC ASSISTED LAPAROSCOPIC RADICAL PROSTATECTOMY;  Surgeon: [name], MD;  Location: [facility] ORS;  Service: Urology;  Laterality: N/A;    Prior to Admission medications   Medication Sig Start Date End Date Taking? Authorizing Provider  acetaminophen (TYLENOL) 500 MG tablet Take 1,000 mg by mouth every 6 (six) hours as needed for moderate pain or headache.    Yes [provider]  calcium-vitamin D (OSCAL WITH D) 500-200 MG-UNIT tablet Take 1 tablet by mouth.   Yes [provider]  docusate sodium (COLACE) 100 MG capsule Take 1 capsule (100 mg total) by mouth 2 (two) times daily. Patient taking differently: Take 100 mg by mouth daily as needed.  12/11/17  Yes [name], MD  folic acid (FOLVITE) 259 MCG tablet Take  1 tablet (400 mcg total) by mouth daily. 10/04/18  Yes Earlie Server, MD  ibuprofen (ADVIL,MOTRIN) 200 MG tablet Take 400 mg by mouth every 6 (six) hours as needed for headache or moderate pain.   Yes [provider]  Melatonin 5 MG TABS Take 5 mg by mouth daily as needed.   Yes [provider]  Multiple Vitamin (MULTIVITAMIN) tablet Take 1 tablet by mouth daily.   Yes [provider]  vitamin B-12 (CYANOCOBALAMIN) 100 MCG tablet Take 100 mcg by mouth daily.   Yes [provider]  vitamin C (ASCORBIC ACID) 500 MG tablet Take 500 mg by mouth daily.   Yes [provider]    Allergies as of 07/09/2019 - Review Complete 06/13/2019  Allergen Reaction Noted  . Aspirin Other (See Comments) 11/20/2017  . Penicillins Hives and Other (See Comments) 09/09/2015    Family History  Problem Relation Age of Onset  . Stroke Mother   . Stroke Father   . Cancer Sister        breast, lung  . Pulmonary Hypertension Sister   . Cancer Son        prostate  . Prostate cancer Son   . Hypertension Sister   . Bladder Cancer Neg Hx   . Kidney cancer Neg Hx     Social History   Socioeconomic History  . Marital status: Divorced    Spouse name: Not on file  . Number of children: 4  . Years of education: Not on file  .  Highest education level: Associate degree: academic program  Occupational History  . Occupation: Retired  Scientific laboratory technician  . Financial resource strain: Not hard at all  . Food insecurity    Worry: Never true    Inability: Never true  . Transportation needs    Medical: No    Non-medical: No  Tobacco Use  . Smoking status: Light Tobacco Smoker    Years: 48.00    Types: Cigars  . Smokeless tobacco: Never Used  . Tobacco comment: smokes 3 small cigars per day  Substance and Sexual Activity  . Alcohol use: Yes    Alcohol/week: 0.0 standard drinks    Comment: beer rarely  . Drug use: No    Comment: used marijuana more than 10 years ago  . Sexual  activity: Not Currently  Lifestyle  . Physical activity    Days per week: 7 days    Minutes per session: 90 min  . Stress: Only a little  Relationships  . Social connections    Talks on phone: More than three times a week    Gets together: Twice a week    Attends religious service: More than 4 times per year    Active member of club or organization: No    Attends meetings of clubs or organizations: Never    Relationship status: Divorced  . Intimate partner violence    Fear of current or ex partner: No    Emotionally abused: No    Physically abused: No    Forced sexual activity: No  Other Topics Concern  . Not on file  Social History Narrative   Patient just buried his baby sister, Diane,10/2018    Review of Systems: See HPI, otherwise negative ROS  Physical Exam: BP (!) 81/55   Pulse 78   Temp (!) 97.3 F (36.3 C) (Tympanic)   Resp 16   Ht 5\' 8"  (1.727 m)   Wt 61.2 kg   SpO2 99%   BMI 20.53 kg/m  General:   Alert,  pleasant and cooperative in NAD Head:  Normocephalic and atraumatic. Neck:  Supple; no masses or thyromegaly. Lungs:  Clear throughout to auscultation.    Heart:  Regular rate and rhythm. Abdomen:  Soft, nontender and nondistended. Normal bowel sounds, without guarding, and without rebound.   Neurologic:  Alert and  oriented x4;  grossly normal neurologically.  Impression/Plan: Wanya Bangura is here for an colonoscopy to be performed for personal history of colon polyps  Risks, benefits, limitations, and alternatives regarding  colonoscopy have been reviewed with the patient.  Questions have been answered.  All parties agreeable.   Sherri Sear, MD  07/29/2019, 10:22 AM

## 2019-07-30 ENCOUNTER — Encounter: Payer: Self-pay | Admitting: Gastroenterology

## 2019-07-30 LAB — SURGICAL PATHOLOGY

## 2019-07-31 ENCOUNTER — Encounter: Payer: Self-pay | Admitting: Gastroenterology

## 2019-08-05 NOTE — Anesthesia Postprocedure Evaluation (Signed)
Anesthesia Post Note  Patient: Jerry Pena  Procedure(s) Performed: COLONOSCOPY WITH PROPOFOL (N/A )  Patient location during evaluation: PACU Anesthesia Type: General Level of consciousness: awake and alert Pain management: pain level controlled Vital Signs Assessment: post-procedure vital signs reviewed and stable Respiratory status: spontaneous breathing, nonlabored ventilation, respiratory function stable and patient connected to nasal cannula oxygen Cardiovascular status: blood pressure returned to baseline and stable Postop Assessment: no apparent nausea or vomiting Anesthetic complications: no     Last Vitals:  Vitals:   07/29/19 1014 07/29/19 1043  BP: (!) 81/55   Pulse: 78 67  Resp: 16   Temp: (!) 36.3 C   SpO2: 99%     Last Pain:  Vitals:   07/30/19 0858  TempSrc:   PainSc: 0-No pain                 Molli Barrows

## 2019-08-11 ENCOUNTER — Other Ambulatory Visit: Payer: Self-pay | Admitting: Family Medicine

## 2019-08-11 DIAGNOSIS — C61 Malignant neoplasm of prostate: Secondary | ICD-10-CM

## 2019-08-12 ENCOUNTER — Other Ambulatory Visit: Payer: Self-pay

## 2019-08-12 ENCOUNTER — Other Ambulatory Visit: Payer: Medicare HMO

## 2019-08-12 DIAGNOSIS — C61 Malignant neoplasm of prostate: Secondary | ICD-10-CM | POA: Diagnosis not present

## 2019-08-13 LAB — PSA: Prostate Specific Ag, Serum: <0.1 ng/mL (ref 0.0–4.0)

## 2019-08-14 ENCOUNTER — Encounter: Payer: Self-pay | Admitting: Urology

## 2019-08-14 ENCOUNTER — Other Ambulatory Visit: Payer: Self-pay

## 2019-08-14 ENCOUNTER — Ambulatory Visit (INDEPENDENT_AMBULATORY_CARE_PROVIDER_SITE_OTHER): Payer: Medicare HMO | Admitting: Urology

## 2019-08-14 VITALS — BP 124/75 | HR 86 | Ht 68.0 in | Wt 139.0 lb

## 2019-08-14 DIAGNOSIS — N393 Stress incontinence (female) (male): Secondary | ICD-10-CM | POA: Diagnosis not present

## 2019-08-14 DIAGNOSIS — C61 Malignant neoplasm of prostate: Secondary | ICD-10-CM

## 2019-08-14 DIAGNOSIS — Z809 Family history of malignant neoplasm, unspecified: Secondary | ICD-10-CM

## 2019-08-14 DIAGNOSIS — N5231 Erectile dysfunction following radical prostatectomy: Secondary | ICD-10-CM | POA: Diagnosis not present

## 2019-08-14 NOTE — Progress Notes (Signed)
08/14/2019 1:44 PM   Jerry Pena December 02, 1951 [id number]  Referring provider: Arnetha Courser, MD [address] [place],  [place] [address]  Chief Complaint  Patient presents with  . Prostate Cancer    Follow up    HPI: 68 year old male who returns today for 60-month follow-up.  He has a personal history of prostate cancer.  He is s/pradical robotic prostatectomy with bilateral lymph node dissection on 12/10/2017. A non nerve sparing approach was used on the right side with partial nerve sparing on the left.  Surgical pathology consistent with Gleason 4+3 prostate cancer since of extraprostatic extension but negative margins. In addition to this, 1 of 4 nodes from the right pelvic lymph nodes including external iliac and obturator positive, 0 of 2 on the left. pT3aN1 Mx  Postop PSA 0.2. Referred to radiation oncology foradjvanttreatment (whole pelvic radiation completed 07/2018). He received Lupron on 2/19 x 3 month depo buthas declined further injections due to severe hot flashes and intolerance of this medication.   PSA drawn on 08/12/2019 remains undetectable.  He continues to have fairly significant stress incontinence, changes his pad at least every 4 hours which is saturated.  He sees no improvement here.  He has not followed up with physical therapy since surgery but did see them preoperatively.  He is a fairly depressed about this.  In addition to the above, he has erectile dysfunction.  He failed PDE 5 inhibitors.  He was previously using intracavernosal injections which were effective on a few occasions but more recently have not been working.  He is only using 0.4 cc.  He is not try to titrate up the dose.  He reports that his wife is now perimenopausal and his interest in sexual activity has diminished.  Lastly, he reports that he was ligament not coming to this appointment today.  He is feeling really blue about his quality of life.  He pulled out a full  elbow showing his 62 and 82 year old children and reports that he needs to persevere for them.  He also mentions today that his son is a prostate cancer survivor felt has been a good support for him.  His sister died last year of breast cancer.  He may be interested in genetics.   PMH: Past Medical History:  Diagnosis Date  . Beta thalassemia trait 09/27/2018  . Cancer ([place]) 10/2017   Prostate  . DDD (degenerative disc disease), lumbar 06/13/2017   Lumbar imaging June 2018  . Elevated PSA 09/12/2017   Refer to urologist, Sept 2018  . Facet hypertrophy of lumbar region 06/13/2017   Lumbar imaging June 2018  . Hyperlipidemia     Surgical History: Past Surgical History:  Procedure Laterality Date  . CATARACT EXTRACTION W/ INTRAOCULAR LENS IMPLANT Left 3/ 28/2014   eyes  . CATARACT EXTRACTION W/ INTRAOCULAR LENS IMPLANT Right 04/23/2013  . COLONOSCOPY WITH PROPOFOL N/A 07/29/2019   Procedure: COLONOSCOPY WITH PROPOFOL;  Surgeon: Lin Landsman, MD;  Location: [facility] ENDOSCOPY;  Service: Gastroenterology;  Laterality: N/A;  . PELVIC LYMPH NODE DISSECTION N/A 12/10/2017   Procedure: PELVIC LYMPH NODE DISSECTION;  Surgeon: [name], MD;  Location: [facility] ORS;  Service: Urology;  Laterality: N/A;  . PROSTATE SURGERY    . ROBOT ASSISTED LAPAROSCOPIC RADICAL PROSTATECTOMY N/A 12/10/2017   Procedure: ROBOTIC ASSISTED LAPAROSCOPIC RADICAL PROSTATECTOMY;  Surgeon: [name], MD;  Location: [facility] ORS;  Service: Urology;  Laterality: N/A;    Home Medications:  Allergies as of 08/14/2019  Reactions   Aspirin Other (See Comments)   Upset stomach, indigestion and heart burn   Penicillins Hives, Other (See Comments)   Has patient had a PCN reaction causing immediate rash, facial/tongue/throat swelling, SOB or lightheadedness with hypotension: Yes Has patient had a PCN reaction causing severe rash involving mucus membranes or skin necrosis: No Has patient had a PCN reaction that  required hospitalization: Yes Has patient had a PCN reaction occurring within the last 10 years: No If all of the above answers are "NO", then may proceed with Cephalosporin use.      Medication List       Accurate as of August 14, 2019  1:44 PM. If you have any questions, ask your nurse or doctor.        STOP taking these medications   docusate sodium 100 MG capsule Commonly known as: COLACE Stopped by: [name], MD     TAKE these medications   acetaminophen 500 MG tablet Commonly known as: TYLENOL Take 1,000 mg by mouth every 6 (six) hours as needed for moderate pain or headache.   calcium-vitamin D 500-200 MG-UNIT tablet Commonly known as: OSCAL WITH D Take 1 tablet by mouth.   folic acid 263 MCG tablet Commonly known as: FOLVITE Take 1 tablet (400 mcg total) by mouth daily.   ibuprofen 200 MG tablet Commonly known as: ADVIL Take 400 mg by mouth every 6 (six) hours as needed for headache or moderate pain.   Melatonin 5 MG Tabs Take 5 mg by mouth daily as needed.   multivitamin tablet Take 1 tablet by mouth daily.   psyllium 58.6 % packet Commonly known as: METAMUCIL Take 1 packet by mouth daily.   vitamin B-12 100 MCG tablet Commonly known as: CYANOCOBALAMIN Take 100 mcg by mouth daily.   vitamin C 500 MG tablet Commonly known as: ASCORBIC ACID Take 500 mg by mouth daily.       Allergies:  Allergies  Allergen Reactions  . Aspirin Other (See Comments)    Upset stomach, indigestion and heart burn  . Penicillins Hives and Other (See Comments)    Has patient had a PCN reaction causing immediate rash, facial/tongue/throat swelling, SOB or lightheadedness with hypotension: Yes Has patient had a PCN reaction causing severe rash involving mucus membranes or skin necrosis: No Has patient had a PCN reaction that required hospitalization: Yes Has patient had a PCN reaction occurring within the last 10 years: No If all of the above answers are "NO", then  may proceed with Cephalosporin use.     Family History: Family History  Problem Relation Age of Onset  . Stroke Mother   . Stroke Father   . Cancer Sister        breast, lung  . Pulmonary Hypertension Sister   . Cancer Son        prostate  . Prostate cancer Son   . Hypertension Sister   . Bladder Cancer Neg Hx   . Kidney cancer Neg Hx     Social History:  reports that he has been smoking cigars. He has smoked for the past 48.00 years. He has never used smokeless tobacco. He reports current alcohol use. He reports that he does not use drugs.  ROS: UROLOGY Frequent Urination?: No Hard to postpone urination?: No Burning/pain with urination?: No Get up at night to urinate?: No Leakage of urine?: Yes Urine stream starts and stops?: No Trouble starting stream?: No Do you have to strain to urinate?: No Blood  in urine?: No Urinary tract infection?: No Sexually transmitted disease?: No Injury to kidneys or bladder?: No Painful intercourse?: No Weak stream?: No Erection problems?: Yes Penile pain?: No  Gastrointestinal Nausea?: No Vomiting?: No Indigestion/heartburn?: No Diarrhea?: No Constipation?: No  Constitutional Fever: No Night sweats?: No Weight loss?: No Fatigue?: No  Skin Skin rash/lesions?: No Itching?: No  Eyes Blurred vision?: No Double vision?: No  Ears/Nose/Throat Sore throat?: No Sinus problems?: No  Hematologic/Lymphatic Swollen glands?: No Easy bruising?: No  Cardiovascular Leg swelling?: No Chest pain?: No  Respiratory Cough?: No Shortness of breath?: No  Endocrine Excessive thirst?: No  Musculoskeletal Back pain?: No Joint pain?: No  Neurological Headaches?: No Dizziness?: No  Psychologic Depression?: Yes Anxiety?: No  Physical Exam: BP 124/75   Pulse 86   Ht 5\' 8"  (1.727 m)   Wt 139 lb (63 kg)   BMI 21.13 kg/m   Constitutional:  Alert and oriented, No acute distress. HEENT: [place] AT, moist mucus membranes.   Trachea midline, no masses. Cardiovascular: No clubbing, cyanosis, or edema. Respiratory: Normal respiratory effort, no increased work of breathing. Skin: No rashes, bruises or suspicious lesions. Neurologic: Grossly intact, no focal deficits, moving all 4 extremities. Psychiatric: Normal mood and affect.  Laboratory Data: Lab Results  Component Value Date   WBC 3.4 (L) 09/23/2018   HGB 12.6 (L) 09/23/2018   HGB 12.8 (L) 09/23/2018   HCT 39.4 09/23/2018   HCT 40.6 09/23/2018   MCV 71.1 (L) 09/23/2018   MCV 72.4 (L) 09/23/2018   PLT 315 09/23/2018    Lab Results  Component Value Date   CREATININE 0.91 06/13/2019   Component     Latest Ref Rng & Units 08/12/2019  Prostate Specific Ag, Serum     0.0 - 4.0 ng/mL <0.1    Assessment & Plan:    1. Prostate cancer ([place]) pT3a N1 with persistent PSA of 0.2s/p RALP with BPNLD 11/2017  Stagingvia axman PET negative  Most recent PSA undetectable, 08/12/19 Refusing further ADT If PSA rises consider oral agents w/o lupron Would recommend that he continue to have PSAs every 3 months, alternating between myself and radiation oncology  Return in about 6 months with PSA prior  Patient was encouraged to follow-up routinely as scheduled, strongly recommend continued family and social support  2. Stress incontinence of urine Minimal improvement Affecting quality of life We discussed various options including referral back to physical therapy, advised that this will help him improve some of but not likely resolve his issue given that it is been greater than a year since surgery/radiation We discussed alternatives including Cunningham clamp another incontinence devices We also discussed the role of sling versus AUS, may be interested in pursuing this down the road If he is interested in pursuing either of the surgical interventions for incontinence, will refer to Dr. Otho Najjar for further evaluation - Ambulatory referral to Physical Therapy   3. Family history of malignant neoplasm Personal history of advanced metastatic prostate cancer Symptoms also diagnosed and treated at a fairly young age He also reports that his sister recently died of breast cancer Given his fairly strong family history, concern for possible underlying genetic issue, genetic testing offered He is very interested given that he has 2 young children - Ambulatory referral to Genetics  4. Erectile dysfunction after radical prostatectomy Advised to titrate up dose up to 1 mL sequentially As he titrates up the dose, I recommended that he try to do this during office hours in  case he is any issues with non-resolving erection/priapism Precautions reviewed Discussed alternatives including penile prosthesis-he will let us know if he would like a referral  Return in about 6 months (around 02/14/2020) for PSA.  [name], MD  [facility] [address], [place], [place] [address] [phone]  I spent 40 min with this patient of which greater than 50% was spent in counseling and coordination of care with the patient.

## 2019-08-25 ENCOUNTER — Encounter: Payer: Self-pay | Admitting: Physical Therapy

## 2019-08-25 ENCOUNTER — Other Ambulatory Visit: Payer: Self-pay

## 2019-08-25 ENCOUNTER — Ambulatory Visit: Payer: Medicare HMO | Attending: Urology | Admitting: Physical Therapy

## 2019-08-25 DIAGNOSIS — R2689 Other abnormalities of gait and mobility: Secondary | ICD-10-CM | POA: Insufficient documentation

## 2019-08-25 DIAGNOSIS — R278 Other lack of coordination: Secondary | ICD-10-CM | POA: Insufficient documentation

## 2019-08-25 DIAGNOSIS — R29898 Other symptoms and signs involving the musculoskeletal system: Secondary | ICD-10-CM | POA: Insufficient documentation

## 2019-08-25 NOTE — Therapy (Signed)
[place] MAIN [facility] SERVICES [address] [place], [place], [address] Phone: [phone]   Fax:  [phone]  Physical Therapy Evaluation  Patient Details  Name: Jerry Pena MRN: [id number] Date of Birth: 01-Jan-1951 Referring Provider (PT): Erlene Quan    Encounter Date: 08/25/2019  PT End of Session - 08/25/19 2251    Visit Number  1    Number of Visits  10    Date for PT Re-Evaluation  11/03/19    PT Start Time  1100    PT Stop Time  1210    PT Time Calculation (min)  70 min    Activity Tolerance  Patient tolerated treatment well;No increased pain    Behavior During Therapy  WFL for tasks assessed/performed       Past Medical History:  Diagnosis Date  . Beta thalassemia trait 09/27/2018  . Cancer ([place]) 10/2017   Prostate  . DDD (degenerative disc disease), lumbar 06/13/2017   Lumbar imaging June 2018  . Elevated PSA 09/12/2017   Refer to urologist, Sept 2018  . Facet hypertrophy of lumbar region 06/13/2017   Lumbar imaging June 2018  . Hyperlipidemia     Past Surgical History:  Procedure Laterality Date  . CATARACT EXTRACTION W/ INTRAOCULAR LENS IMPLANT Left 3/ 28/2014   eyes  . CATARACT EXTRACTION W/ INTRAOCULAR LENS IMPLANT Right 04/23/2013  . COLONOSCOPY WITH PROPOFOL N/A 07/29/2019   Procedure: COLONOSCOPY WITH PROPOFOL;  Surgeon: Lin Landsman, MD;  Location: [facility] ENDOSCOPY;  Service: Gastroenterology;  Laterality: N/A;  . PELVIC LYMPH NODE DISSECTION N/A 12/10/2017   Procedure: PELVIC LYMPH NODE DISSECTION;  Surgeon: [name], MD;  Location: [facility] ORS;  Service: Urology;  Laterality: N/A;  . PROSTATE SURGERY    . ROBOT ASSISTED LAPAROSCOPIC RADICAL PROSTATECTOMY N/A 12/10/2017   Procedure: ROBOTIC ASSISTED LAPAROSCOPIC RADICAL PROSTATECTOMY;  Surgeon: [name], MD;  Location: [facility] ORS;  Service: Urology;  Laterality: N/A;    There were no vitals filed for this visit.   Subjective Assessment - 08/25/19  1109    Subjective  Pt underwent prostate surgery for prostate CA in 2018 and is in remission. Pt started experiencing leakage the week after surgery when the catheter was taken out and it has not stopped. When pt is laying around at home, pt changes pads every 2-3 hours. Pt avoid going out of the house for more than 3 hours because he does not want to change pads in public places. Pt only leaves his house for up to 45 min and then returns home.     2) Chronic R LBP since 2006. Has radiating pain the side of the leg to foot at 10/10 which occurs at any time.  Injury occurred in 2006 when he was working a  [employer] and a piece of wood fell down and hit him. Pt jumped back on platform and did not land right. Pt has had R LBP pain since this injury. Pt went to PT    Pertinent History  --    Patient Stated Goals  slow the leakage or stop it all together         [facility] PT Assessment - 08/25/19 1124      Assessment   Medical Diagnosis  SUI    Referring Provider (PT)  Erlene Quan       Precautions   Precautions  None      Restrictions   Weight Bearing Restrictions  No      Balance Screen  Has the patient fallen in the past 6 months  No      Observation/Other Assessments   Observations  R shoulder lower ( post Tx : leveled)       Palpation   Spinal mobility  all directions WFL, L rotation "took pressure of R pain)     SI assessment   standing: L iliac crest higher, R convex lumbar curve, supine: ASIS levelled, L malleoli longer       Ambulation/Gait   Gait Comments  minimal pelvic rotation, L limited pelvic anterior rotation, R hip higher than L         Pelvic floor assessment:  Dyscoordination with abdominal overuse, ( post Tx: coordination achieved with cues)         Objective measurements completed on examination: See above findings.      [place] [facility] PT Treatment/Exercise - 08/25/19 1124      Bed Mobility   Bed Mobility  --   crunch method ( abdominal / pelvic floor  strain)      Posture/Postural Control   Posture Comments  abdominal straining with double leg lifts ( self-selected exercise)       Neuro Re-ed    Neuro Re-ed Details   see pt instructions to promote SIJ stability, cued for gait mechanics        Modalities   Modalities  Moist Heat      Moist Heat Therapy   Number Minutes Moist Heat  5 Minutes    Moist Heat Location  --   sacrum, during exercises     Manual Therapy   Manual therapy comments  L long axis distraction, MWM at SIJ superior glide/PA mob to promote nutation of sacrum.                  PT Long Term Goals - 08/25/19 2151      PT LONG TERM GOAL #1   Title  Pt will decrease IPSS score from 4 pts to < 2 pts, QOL: 1 pt to 0 pts in order to  improve continence    Time  10    Period  Weeks    Status  New    Target Date  11/03/19      PT LONG TERM GOAL #2   Title  Pt will decrease his ODI score from 11% to < 5% in order to improve back function for ADLs and fitness    Time  8    Period  Weeks    Status  New    Target Date  10/20/19      PT LONG TERM GOAL #3   Title  Pt will demo increased L SIJ mobility, no increased mm tightness / R convex lumbar curve across 2 sessions in order to progress to hip extension strengthening exercises    Time  2    Period  Weeks    Status  New    Target Date  09/08/19      PT LONG TERM GOAL #4   Title  Pt will demo increased co-activation of deep core mm/lower kinetic chain with gait and  fitness exercises to minimize incontinence    Time  8    Period  Weeks    Status  New    Target Date  10/20/19      PT LONG TERM GOAL #5   Title  Pt will demo IND w/ modifications to his fitness routine to minimize pelvic floor dysfunctions    Time  6    Period  Weeks    Status  New    Target Date  10/06/19      Additional Long Term Goals   Additional Long Term Goals  Yes      PT LONG TERM GOAL #6   Title  Pt will report increasing his time of leaving his house from 45 min to > 2  hours to  participate in public and social activities    Time  10    Period  Weeks    Status  New    Target Date  11/03/19             Plan - 08/25/19 2322    Clinical Impression Statement Pt is a 68 yo who reports urinary incontinence and CLBP with radiating pain down RLE. These deficits impact his QOL and participating in public events. Pt's clinical presentations included pelvic obliquities and uneven iliac crest height, limited spinal movements, lumbar convex curve, gait deviations, dyscoordination of pelvic floor/ deep core with abdominal mm overuse with pelvic floor contractions. Following Tx today, pt's iliac crest was no longer uneven, lumbar curve with increased mobility, increased stride length, increased hip flexion, less heel striking, increased arm swing/ thoracic rotation. Pt reported no radiating pain with walking. Pt demo'd proper pelvic floor contraction with training and no longer showed abdominal mm overuse. Pt benefit from skilled PT.        Personal Factors and Comorbidities  Comorbidity 2    Examination-Activity Limitations  Stand;Squat;Transfers;Continence;Locomotion Level;Lift;Bend    Stability/Clinical Decision Making  Evolving/Moderate complexity    Clinical Decision Making  Moderate    Rehab Potential  Good    PT Frequency  1x / week    PT Duration  --   10   PT Treatment/Interventions  Neuromuscular re-education;Patient/family education;Stair training;Therapeutic activities;Therapeutic exercise;Manual techniques;Moist Heat    Consulted and Agree with Plan of Care  Patient       Patient will benefit from skilled therapeutic intervention in order to improve the following deficits and impairments:  Difficulty walking, Decreased mobility, Decreased balance, Impaired flexibility, Decreased coordination, Decreased safety awareness, Hypomobility, Decreased endurance, Decreased strength, Increased muscle spasms  Visit Diagnosis: Other lack of  coordination  Other symptoms and signs involving the musculoskeletal system  Other abnormalities of gait and mobility     Problem List Patient Active Problem List   Diagnosis Date Noted  . Radiation proctitis   . Migraine 01/23/2019  . Cataract 01/23/2019  . Beta thalassemia trait 09/27/2018  . Anemia 09/23/2018  . Neutropenia ([place]) 09/23/2018  . History of hormone therapy 06/07/2018  . Prostate cancer ([place]) 12/10/2017  . Facet hypertrophy of lumbar region 06/13/2017  . DDD (degenerative disc disease), lumbar 06/13/2017  . Hyperlipidemia 09/09/2015  . Sciatica 09/09/2015  . Arthritis, lumbar spine 09/09/2015  . Tobacco abuse 09/09/2015  . Personal history of colonic polyps 09/09/2015    Jerl Mina ,PT, DPT, E-RYT  08/25/2019, 11:36 PM  [place] MAIN [facility] SERVICES [address] [place], [place], [address] Phone: [phone]   Fax:  [phone]  Name: Jerrico Jochim MRN: [id number] Date of Birth: [birth year]

## 2019-08-25 NOTE — Patient Instructions (Signed)
Open book this week only to the R  Lie on your L side   open book to the R only 15 reps x 2 day  ___  3- point taps  X 10   R hand on the wall to side   Soft bend in the L standing knee Starting position with feet under the width of hips  R [place] tap the ground:  Forward, back to center ( hip width position), Right,  back to center ( hip width position), Back, back to center ( hip width position),  ____    Doorway hip flexor stretch  Lunge position,  front knee does not move,  Back knee bends and straightens   10 reps   _____     Transition from standing to floor:  stand to floor transfer :      _ slow     _ mini squat      _ crawl down     _downward dog  - >  shoulders down and bac-  walk the dog ( knee bents to lengthe hamstrings)      Floor to stand :   downward dog   crawl hands back, butt is back, knees behind toes -> squat  Hands at waist , elbows back, chest lifts  ___   Pelvic floor exercises  PELVIC FLOOR / KEGEL EXERCISES   Pelvic floor/ Kegel exercises are used to strengthen the muscles in the base of your pelvis that are responsible for supporting your pelvic organs and preventing urine/feces leakage. Based on your therapist's recommendations, they can be performed while standing, sitting, or lying down.  Make yourself aware of this muscle group by using these cues:  Imagine you are in a crowded room and you feel the need to pass gas. Your response is to pull up and in at the rectum.  Close the rectum. Pull the muscles up inside your body,feeling your scrotum lifting as well . Feel the pelvic floor muscles lift as if you were walking into a cold lake.  Place your hand on top of your pubic bone. Tighten and draw in the muscles around the anal muscles without squeezing the buttock muscles.  Common Errors:  Breath holding: If you are holding your breath, you may be bearing down against your bladder instead of pulling it up. If you belly  bulges up while you are squeezing, you are holding your breath. Be sure to breathe gently in and out while exercising. Counting out loud may help you avoid holding your breath.  Accessory muscle use: You should not see or feel other muscle movement when performing pelvic floor exercises. When done properly, no one can tell that you are performing the exercises. Keep the buttocks, belly and inner thighs relaxed.  Overdoing it: Your muscles can fatigue and stop working for you if you over-exercise. You may actually leak more or feel soreness at the lower abdomen or rectum.  YOUR HOME EXERCISE PROGRAM   L  SHORT HOLDS: Position: on back  Inhale 1-2 pause and then exhale 2-1 pause. Then squeeze the muscle.  (Be sure to let belly sink in with exhales and not push outward) ( DO NOT USE STOMACH)   Perform 10 repetitions, 3  Times/day   **ALSO SQUEEZE BEFORE YOUR SNEEZE, COUGH, LAUGH to decrease downward pressure   **ALSO EXHALE BEFORE YOU RISE AGAINST GRAVITY (lifting, sit to stand, from squat to stand)

## 2019-09-08 ENCOUNTER — Ambulatory Visit: Payer: Medicare HMO | Attending: Urology | Admitting: Physical Therapy

## 2019-09-08 ENCOUNTER — Other Ambulatory Visit: Payer: Self-pay

## 2019-09-08 DIAGNOSIS — R2689 Other abnormalities of gait and mobility: Secondary | ICD-10-CM | POA: Diagnosis not present

## 2019-09-08 DIAGNOSIS — R29898 Other symptoms and signs involving the musculoskeletal system: Secondary | ICD-10-CM | POA: Diagnosis not present

## 2019-09-08 DIAGNOSIS — R278 Other lack of coordination: Secondary | ICD-10-CM | POA: Diagnosis not present

## 2019-09-08 NOTE — Patient Instructions (Addendum)
This week:   Stretches for neck and L low back   1)    Reclined twist   Lay on your back, knees bend Scoot hips to the R , leave shoulders in place Drop knees to the L side resting onto pillows to keep leg at the same width of hips Pillow under L thigh to minimize too much strain   Neck turned to R side, chin tucked   10 reps, bringing R thigh to chest   Then 5 min minutes rest    2)  On your back , hips centered again: ( elbow bent)   dragging arm up and down without lifting it to allow for shoulder blade slide    3)  During the day at kitchen counter  Hands at counter, knees bent, R hand on L thigh, look under L armpit    ____________  Maintain this exercise long term   Deep core strengthening   Level 1 ( 10 reps)  Level 2 ( 6 min)   ____

## 2019-09-08 NOTE — Therapy (Signed)
[place] MAIN [facility] SERVICES [address] [place], [place], [address] Phone: [phone]   Fax:  [phone]  Physical Therapy Treatment  Patient Details  Name: Jerry Pena MRN: [id number] Date of Birth: 09/26/1951 Referring Provider (PT): Erlene Quan    Encounter Date: 09/08/2019  PT End of Session - 09/08/19 1108    Visit Number  2    Number of Visits  10    Date for PT Re-Evaluation  11/03/19    PT Start Time  1103    PT Stop Time  [id number]    PT Time Calculation (min)  55 min    Activity Tolerance  Patient tolerated treatment well;No increased pain    Behavior During Therapy  WFL for tasks assessed/performed       Past Medical History:  Diagnosis Date  . Beta thalassemia trait 09/27/2018  . Cancer ([place]) 10/2017   Prostate  . DDD (degenerative disc disease), lumbar 06/13/2017   Lumbar imaging June 2018  . Elevated PSA 09/12/2017   Refer to urologist, Sept 2018  . Facet hypertrophy of lumbar region 06/13/2017   Lumbar imaging June 2018  . Hyperlipidemia     Past Surgical History:  Procedure Laterality Date  . CATARACT EXTRACTION W/ INTRAOCULAR LENS IMPLANT Left 3/ 28/2014   eyes  . CATARACT EXTRACTION W/ INTRAOCULAR LENS IMPLANT Right 04/23/2013  . COLONOSCOPY WITH PROPOFOL N/A 07/29/2019   Procedure: COLONOSCOPY WITH PROPOFOL;  Surgeon: Lin Landsman, MD;  Location: [facility] ENDOSCOPY;  Service: Gastroenterology;  Laterality: N/A;  . PELVIC LYMPH NODE DISSECTION N/A 12/10/2017   Procedure: PELVIC LYMPH NODE DISSECTION;  Surgeon: [name], MD;  Location: [facility] ORS;  Service: Urology;  Laterality: N/A;  . PROSTATE SURGERY    . ROBOT ASSISTED LAPAROSCOPIC RADICAL PROSTATECTOMY N/A 12/10/2017   Procedure: ROBOTIC ASSISTED LAPAROSCOPIC RADICAL PROSTATECTOMY;  Surgeon: [name], MD;  Location: [facility] ORS;  Service: Urology;  Laterality: N/A;    There were no vitals filed for this visit.  Subjective Assessment - 09/08/19 1105     Subjective  Pt has not had a cigarrette since the 08/25/19. Pt reports his exercises are going great. Pt had one day with sciatic nerve pain across the past 2 weeks.    Patient Stated Goals  slow the leakage or stop it all together         [facility] PT Assessment - 09/08/19 1109      Coordination   Gross Motor Movements are Fluid and Coordinated  --   deep core w/ minor cues      Palpation   Spinal mobility  all directions WFL no pain    SI assessment   B iliac crest levelled, R shoulder slightly lowered, L lumbar convex      Palpation comment  mm bulk R paraspinal at T/L more prominent  ( Hx: Pt reported he used to perform twisting to the R at work repeated before retirement) ( post Tx decreased mm tightness)         Ambulation/Gait   Gait Comments  longer stride, excessive R thoracic rotation/ R armswing ( L lumbar convex)   ( post Tx: no excessive R trunk rotation)                   [facility] Adult PT Treatment/Exercise - 09/08/19 1109      Neuro Re-ed    Neuro Re-ed Details   cued for scap depression/ retraction, technique for deep core and stretches  Modalities   Modalities  Moist Heat      Moist Heat Therapy   Number Minutes Moist Heat  10 Minutes    Moist Heat Location  --   R throacic/ during HEP instruction     Manual Therapy   Manual therapy comments  STM/MWM along paraspinal thoracic/ med scap to T/ L junction,.  inferior mob at 1st rib R, R C7/T1 medial glide, levator STM /MWM              PT Long Term Goals - 08/25/19 2151      PT LONG TERM GOAL #1   Title  Pt will decrease IPSS score from 4 pts to < 2 pts, QOL: 1 pt to 0 pts in order to  improve continence    Time  10    Period  Weeks    Status  New    Target Date  11/03/19      PT LONG TERM GOAL #2   Title  Pt will decrease his ODI score from 11% to < 5% in order to improve back function for ADLs and fitness    Time  8    Period  Weeks    Status  New    Target Date  10/20/19       PT LONG TERM GOAL #3   Title  Pt will demo increased L SIJ mobility, no increased mm tightness / R convex lumbar curve across 2 sessions in order to progress to hip extension strengthening exercises    Time  2    Period  Weeks    Status  New    Target Date  09/08/19      PT LONG TERM GOAL #4   Title  Pt will demo increased co-activation of deep core mm/lower kinetic chain with gait and  fitness exercises to minimize incontinence    Time  8    Period  Weeks    Status  New    Target Date  10/20/19      PT LONG TERM GOAL #5   Title  Pt will demo IND w/ modifications to his fitness routine to minimize pelvic floor dysfunctions    Time  6    Period  Weeks    Status  New    Target Date  10/06/19      Additional Long Term Goals   Additional Long Term Goals  Yes      PT LONG TERM GOAL #6   Title  Pt will report increasing his time of leaving his house from 45 min to > 2 hours to  participate in public and social activities    Time  10    Period  Weeks    Status  New    Target Date  11/03/19            Plan - 09/08/19 1146    Clinical Impression Statement  Pt demo'd good carry over with improved pelvic alignment and increased SIJ mobility. Focused on L lumbar convex curve. R lowered shoulder  which are deviations  not due to true scoliosis but from increased mm imbalance of R paraspinal mm/ medial scapular mm/ and cervical mm 2/2 repeated twisting to one side when he worked at [employer] job prior to retirement.    Following Tx, pt demo'd decreased mm tensions with improved gait mechanics. Pt reported he feels a lot better . Progressed pt to deep core strengthening. Anticipate pt will improve with leakage issues  following these orthopedic realignment and mm balances in place. Pt continues to benefit from skilled PT     Personal Factors and Comorbidities  Comorbidity 2    Examination-Activity Limitations  Stand;Squat;Transfers;Continence;Locomotion Level;Lift;Bend     Stability/Clinical Decision Making  Evolving/Moderate complexity    Rehab Potential  Good    PT Frequency  1x / week    PT Duration  --   10   PT Treatment/Interventions  Neuromuscular re-education;Patient/family education;Stair training;Therapeutic activities;Therapeutic exercise;Manual techniques;Moist Heat    Consulted and Agree with Plan of Care  Patient       Patient will benefit from skilled therapeutic intervention in order to improve the following deficits and impairments:  Difficulty walking, Decreased mobility, Decreased balance, Impaired flexibility, Decreased coordination, Decreased safety awareness, Hypomobility, Decreased endurance, Decreased strength, Increased muscle spasms  Visit Diagnosis: Other lack of coordination  Other symptoms and signs involving the musculoskeletal system  Other abnormalities of gait and mobility     Problem List Patient Active Problem List   Diagnosis Date Noted  . Radiation proctitis   . Migraine 01/23/2019  . Cataract 01/23/2019  . Beta thalassemia trait 09/27/2018  . Anemia 09/23/2018  . Neutropenia ([place]) 09/23/2018  . History of hormone therapy 06/07/2018  . Prostate cancer ([place]) 12/10/2017  . Facet hypertrophy of lumbar region 06/13/2017  . DDD (degenerative disc disease), lumbar 06/13/2017  . Hyperlipidemia 09/09/2015  . Sciatica 09/09/2015  . Arthritis, lumbar spine 09/09/2015  . Tobacco abuse 09/09/2015  . Personal history of colonic polyps 09/09/2015    Jerl Mina ,PT, DPT, E-RYT  09/08/2019, 11:49 AM  [place] MAIN [facility] SERVICES [address] [place], [place], [address] Phone: [phone]   Fax:  [phone]  Name: Jerry Pena MRN: [id number] Date of Birth: 27-May-1951

## 2019-09-15 ENCOUNTER — Ambulatory Visit: Payer: Medicare HMO | Admitting: Physical Therapy

## 2019-09-15 ENCOUNTER — Other Ambulatory Visit: Payer: Self-pay

## 2019-09-15 DIAGNOSIS — R2689 Other abnormalities of gait and mobility: Secondary | ICD-10-CM

## 2019-09-15 DIAGNOSIS — R29898 Other symptoms and signs involving the musculoskeletal system: Secondary | ICD-10-CM

## 2019-09-15 DIAGNOSIS — R278 Other lack of coordination: Secondary | ICD-10-CM | POA: Diagnosis not present

## 2019-09-15 NOTE — Therapy (Signed)
[place] MAIN [facility] SERVICES [address] [place], [place], [address] Phone: [phone]   Fax:  [phone]  Physical Therapy Treatment  Patient Details  Name: Jerry Pena MRN: [id number] Date of Birth: June 28, 1951 Referring Provider (PT): Erlene Quan    Encounter Date: 09/15/2019  PT End of Session - 09/15/19 1113    Visit Number  3    Number of Visits  10    Date for PT Re-Evaluation  11/03/19    PT Start Time  1110    PT Stop Time  [id number]    PT Time Calculation (min)  49 min    Activity Tolerance  Patient tolerated treatment well;No increased pain    Behavior During Therapy  WFL for tasks assessed/performed       Past Medical History:  Diagnosis Date  . Beta thalassemia trait 09/27/2018  . Cancer ([place]) 10/2017   Prostate  . DDD (degenerative disc disease), lumbar 06/13/2017   Lumbar imaging June 2018  . Elevated PSA 09/12/2017   Refer to urologist, Sept 2018  . Facet hypertrophy of lumbar region 06/13/2017   Lumbar imaging June 2018  . Hyperlipidemia     Past Surgical History:  Procedure Laterality Date  . CATARACT EXTRACTION W/ INTRAOCULAR LENS IMPLANT Left 3/ 28/2014   eyes  . CATARACT EXTRACTION W/ INTRAOCULAR LENS IMPLANT Right 04/23/2013  . COLONOSCOPY WITH PROPOFOL N/A 07/29/2019   Procedure: COLONOSCOPY WITH PROPOFOL;  Surgeon: Lin Landsman, MD;  Location: [facility] ENDOSCOPY;  Service: Gastroenterology;  Laterality: N/A;  . PELVIC LYMPH NODE DISSECTION N/A 12/10/2017   Procedure: PELVIC LYMPH NODE DISSECTION;  Surgeon: [name], MD;  Location: [facility] ORS;  Service: Urology;  Laterality: N/A;  . PROSTATE SURGERY    . ROBOT ASSISTED LAPAROSCOPIC RADICAL PROSTATECTOMY N/A 12/10/2017   Procedure: ROBOTIC ASSISTED LAPAROSCOPIC RADICAL PROSTATECTOMY;  Surgeon: [name], MD;  Location: [facility] ORS;  Service: Urology;  Laterality: N/A;    There were no vitals filed for this visit.  Subjective Assessment - 09/15/19 1113     Subjective  Pt noticed his ab muscles are getting stronger. Pt feels tightness along his R back with the colder weather.    Patient Stated Goals  slow the leakage or stop it all together         [facility] PT Assessment - 09/15/19 1126      Posture/Postural Control   Posture Comments  minor cues for deep core level 2 technique       Palpation   Palpation comment  no R paraspinal mm tightness       supine: hipIR limited on L ( improved post Tx)            Pelvic Floor Special Questions - 09/15/19 1140    External Perineal Exam  through clothing: STM/MWM deep/ superficial perineal mm tightness L      External Palpation  5 sec, 5 reps before fatigue Grade 2/5 with decreased contraction the L pelvic floor ( post Tx: improved contraction of L, Grade 3/5)         [facility] Adult PT Treatment/Exercise - 09/15/19 1126      Neuro Re-ed    Neuro Re-ed Details   cued for endurance pelvic floor endurance training       Exercises   Exercises  --   see pt instructions, spinal mobility     Manual Therapy   Manual therapy comments  STM/MWM deep/ superficial perineal mm tightness L  PT Short Term Goals - 09/15/19 1112      PT SHORT TERM GOAL #1   Title  Pt will demo proper pelvic floor coordination and contraction in order to progress with pelvic floor strengthenign exercises post surgery    Time  1    Period  Days    Status  On-going      PT SHORT TERM GOAL #2   Title  Pt will be IND with HEP    Time  1    Period  Days    Status  Achieved      PT SHORT TERM GOAL #3   Title  Pt will demo proper coordination of deep core mm to minimzie strain on pelvic floor and abdominal mm with his self selected exercises with weight lifting and other core exercises    Time  1    Period  Weeks    Status  On-going        PT Long Term Goals - 08/25/19 2151      PT LONG TERM GOAL #1   Title  Pt will decrease IPSS score from 4 pts to < 2 pts, QOL: 1 pt to 0 pts in  order to  improve continence    Time  10    Period  Weeks    Status  New    Target Date  11/03/19      PT LONG TERM GOAL #2   Title  Pt will decrease his ODI score from 11% to < 5% in order to improve back function for ADLs and fitness    Time  8    Period  Weeks    Status  New    Target Date  10/20/19      PT LONG TERM GOAL #3   Title  Pt will demo increased L SIJ mobility, no increased mm tightness / R convex lumbar curve across 2 sessions in order to progress to hip extension strengthening exercises    Time  2    Period  Weeks    Status  New    Target Date  09/08/19      PT LONG TERM GOAL #4   Title  Pt will demo increased co-activation of deep core mm/lower kinetic chain with gait and  fitness exercises to minimize incontinence    Time  8    Period  Weeks    Status  New    Target Date  10/20/19      PT LONG TERM GOAL #5   Title  Pt will demo IND w/ modifications to his fitness routine to minimize pelvic floor dysfunctions    Time  6    Period  Weeks    Status  New    Target Date  10/06/19      Additional Long Term Goals   Additional Long Term Goals  Yes      PT LONG TERM GOAL #6   Title  Pt will report increasing his time of leaving his house from 45 min to > 2 hours to  participate in public and social activities    Time  10    Period  Weeks    Status  New    Target Date  11/03/19            Plan - 09/15/19 1207    Clinical Impression Statement Pt progressed to endurance pelvic floor strengthening today but required external manual technique to decrease L pelvic floor tightness to elicit  a stronger contraction. Pt 's LBP continues to be resolved. HEP includes prevention and maintenance of spinal mobility. Deep core strengthening in place. Pt ocntinues to benefit from skilled PT.    Personal Factors and Comorbidities  Comorbidity 2    Examination-Activity Limitations  Stand;Squat;Transfers;Continence;Locomotion Level;Lift;Bend    Stability/Clinical Decision  Making  Evolving/Moderate complexity    Rehab Potential  Good    PT Frequency  1x / week    PT Duration  --   10   PT Treatment/Interventions  Neuromuscular re-education;Patient/family education;Stair training;Therapeutic activities;Therapeutic exercise;Manual techniques;Moist Heat    Consulted and Agree with Plan of Care  Patient       Patient will benefit from skilled therapeutic intervention in order to improve the following deficits and impairments:  Difficulty walking, Decreased mobility, Decreased balance, Impaired flexibility, Decreased coordination, Decreased safety awareness, Hypomobility, Decreased endurance, Decreased strength, Increased muscle spasms  Visit Diagnosis: Other lack of coordination  Other symptoms and signs involving the musculoskeletal system  Other abnormalities of gait and mobility     Problem List Patient Active Problem List   Diagnosis Date Noted  . Radiation proctitis   . Migraine 01/23/2019  . Cataract 01/23/2019  . Beta thalassemia trait 09/27/2018  . Anemia 09/23/2018  . Neutropenia ([place]) 09/23/2018  . History of hormone therapy 06/07/2018  . Prostate cancer ([place]) 12/10/2017  . Facet hypertrophy of lumbar region 06/13/2017  . DDD (degenerative disc disease), lumbar 06/13/2017  . Hyperlipidemia 09/09/2015  . Sciatica 09/09/2015  . Arthritis, lumbar spine 09/09/2015  . Tobacco abuse 09/09/2015  . Personal history of colonic polyps 09/09/2015    Jerl Mina ,PT, DPT, E-RYT  09/15/2019, 12:07 PM  [place] MAIN [facility] SERVICES [address] [place], [place], [address] Phone: [phone]   Fax:  [phone]  Name: Jerry Pena MRN: [id number] Date of Birth: [birth year]

## 2019-09-15 NOTE — Patient Instructions (Addendum)
  Overall stretches in the morning for spine - angel wings  _6 directions of the neck, no pillow, no lifting head, chin down   Back stretches during the day, kitchen counter:  Minisquat, hands on the counter: lengthen back, knees bent  Walk hands to each side to length side muscles  Both sides, cross arm on knee    ___  Add pelvic floor stretch Stretch for pelvic floor   "v heels slide away and then back toward buttocks and then rock knee to slight ,  slide heel along at 11 o clock away from buttocks   10 reps    ___   Deep core level 1  + 10 quick contractions  of pelvic floor  Dep core level 2 ( 6 min)   Add  PELVIC FLOOR / KEGEL EXERCISES   Pelvic floor/ Kegel exercises are used to strengthen the muscles in the base of your pelvis that are responsible for supporting your pelvic organs and preventing urine/feces leakage. Based on your therapist's recommendations, they can be performed while standing, sitting, or lying down.  Make yourself aware of this muscle group by using these cues:  Imagine you are in a crowded room and you feel the need to pass gas. Your response is to pull up and in at the rectum.  Close the rectum. Pull the muscles up inside your body,feeling your scrotum lifting as well . Feel the pelvic floor muscles lift as if you were walking into a cold lake.  Place your hand on top of your pubic bone. Tighten and draw in the muscles around the anal muscles without squeezing the buttock muscles.  Common Errors:  Breath holding: If you are holding your breath, you may be bearing down against your bladder instead of pulling it up. If you belly bulges up while you are squeezing, you are holding your breath. Be sure to breathe gently in and out while exercising. Counting out loud may help you avoid holding your breath.  Accessory muscle use: You should not see or feel other muscle movement when performing pelvic floor exercises. When done properly, no one can  tell that you are performing the exercises. Keep the buttocks, belly and inner thighs relaxed.  Overdoing it: Your muscles can fatigue and stop working for you if you over-exercise. You may actually leak more or feel soreness at the lower abdomen or rectum.  YOUR HOME EXERCISE PROGRAM  LONG HOLDS: Position: on back  Inhale and then exhale. Then squeeze the muscle and count aloud for 5 seconds. Rest with three long breaths. (Be sure to let belly sink in with exhales and not push outward)  Perform 5 repetitions, 5  times/day

## 2019-09-22 ENCOUNTER — Ambulatory Visit: Payer: Medicare HMO | Admitting: Physical Therapy

## 2019-09-29 ENCOUNTER — Ambulatory Visit: Payer: Medicare HMO | Attending: Urology | Admitting: Physical Therapy

## 2019-09-29 ENCOUNTER — Other Ambulatory Visit: Payer: Self-pay

## 2019-09-29 DIAGNOSIS — R2689 Other abnormalities of gait and mobility: Secondary | ICD-10-CM | POA: Diagnosis not present

## 2019-09-29 DIAGNOSIS — R278 Other lack of coordination: Secondary | ICD-10-CM | POA: Insufficient documentation

## 2019-09-29 DIAGNOSIS — R29898 Other symptoms and signs involving the musculoskeletal system: Secondary | ICD-10-CM | POA: Diagnosis not present

## 2019-09-29 NOTE — Patient Instructions (Addendum)
This week:  1) On your back, clasp hands , elbow pull on inhale, exhale shoulders down , ribs down   2) Side rib by _ shoulder stetch   Lie on   side , pillow between knees  Pull  arm overhead over mattress, grab the edge of mattress,pull it upward, drawing elbow away from ears  Breathing     3) Open book     4) Quad stretch on your side  10 reps   __   Wall stretch , R hands up at 12 to 3 o clock, elbow bends, slight trunk turn to R   10 reps

## 2019-09-29 NOTE — Therapy (Signed)
[place] MAIN [facility] SERVICES [address] [place], [place], [address] Phone: [phone]   Fax:  [phone]  Physical Therapy Treatment  Patient Details  Name: Jerry Pena MRN: [id number] Date of Birth: 1950-12-29 Referring Provider (PT): Erlene Quan    Encounter Date: 09/29/2019  PT End of Session - 09/29/19 1156    Visit Number  4    Number of Visits  10    Date for PT Re-Evaluation  11/03/19    PT Start Time  1100    PT Stop Time  1154    PT Time Calculation (min)  54 min    Activity Tolerance  Patient tolerated treatment well;No increased pain    Behavior During Therapy  WFL for tasks assessed/performed       Past Medical History:  Diagnosis Date  . Beta thalassemia trait 09/27/2018  . Cancer ([place]) 10/2017   Prostate  . DDD (degenerative disc disease), lumbar 06/13/2017   Lumbar imaging June 2018  . Elevated PSA 09/12/2017   Refer to urologist, Sept 2018  . Facet hypertrophy of lumbar region 06/13/2017   Lumbar imaging June 2018  . Hyperlipidemia     Past Surgical History:  Procedure Laterality Date  . CATARACT EXTRACTION W/ INTRAOCULAR LENS IMPLANT Left 3/ 28/2014   eyes  . CATARACT EXTRACTION W/ INTRAOCULAR LENS IMPLANT Right 04/23/2013  . COLONOSCOPY WITH PROPOFOL N/A 07/29/2019   Procedure: COLONOSCOPY WITH PROPOFOL;  Surgeon: Lin Landsman, MD;  Location: [facility] ENDOSCOPY;  Service: Gastroenterology;  Laterality: N/A;  . PELVIC LYMPH NODE DISSECTION N/A 12/10/2017   Procedure: PELVIC LYMPH NODE DISSECTION;  Surgeon: [name], MD;  Location: [facility] ORS;  Service: Urology;  Laterality: N/A;  . PROSTATE SURGERY    . ROBOT ASSISTED LAPAROSCOPIC RADICAL PROSTATECTOMY N/A 12/10/2017   Procedure: ROBOTIC ASSISTED LAPAROSCOPIC RADICAL PROSTATECTOMY;  Surgeon: [name], MD;  Location: [facility] ORS;  Service: Urology;  Laterality: N/A;    There were no vitals filed for this visit.  Subjective Assessment - 09/29/19 1110     Subjective  Pt feels better about leaving his house for more than 45 min than before. Pt knows his leakage will take time to improve and he is doing his exercises. Pt says his R sciatic pain is not travelling down his leg like it used to. It only occurs at the hip level typically upon waking in the morning. Pt feels he has more balance than before.    Patient Stated Goals  slow the leakage or stop it all together         [facility] PT Assessment - 09/29/19 1118      Observation/Other Assessments   Observations  shoulders levelled      Palpation   Palpation comment  tightness at glut med, R paraspinal  T/L at curve                    [facility] Adult PT Treatment/Exercise - 09/29/19 1154      Modalities   Modalities  Moist Heat      Moist Heat Therapy   Number Minutes Moist Heat  10 Minutes    Moist Heat Location  --   R trunk, during manual Tx at glut med      Manual Therapy   Manual therapy comments  STM/MWM at intercostal T10-11, Grade III at T7-10 to promote R trunk rotation, STM/MWM at R glut med and Greater Trochanter to promote hip ext  PT Short Term Goals - 09/15/19 1112      PT SHORT TERM GOAL #1   Title  Pt will demo proper pelvic floor coordination and contraction in order to progress with pelvic floor strengthenign exercises post surgery    Time  1    Period  Days    Status  On-going      PT SHORT TERM GOAL #2   Title  Pt will be IND with HEP    Time  1    Period  Days    Status  Achieved      PT SHORT TERM GOAL #3   Title  Pt will demo proper coordination of deep core mm to minimzie strain on pelvic floor and abdominal mm with his self selected exercises with weight lifting and other core exercises    Time  1    Period  Weeks    Status  On-going        PT Long Term Goals - 09/29/19 1159      PT LONG TERM GOAL #1   Title  Pt will decrease IPSS score from 4 pts to < 2 pts, QOL: 1 pt to 0 pts in order to  improve continence     Time  10    Period  Weeks    Status  On-going      PT LONG TERM GOAL #2   Title  Pt will decrease his ODI score from 11% to < 5% in order to improve back function for ADLs and fitness    Time  8    Period  Weeks    Status  On-going      PT LONG TERM GOAL #3   Title  Pt will demo increased L SIJ mobility, no increased mm tightness / R convex lumbar curve across 2 sessions in order to progress to hip extension strengthening exercises    Time  2    Period  Weeks    Status  Partially Met      PT LONG TERM GOAL #4   Title  Pt will demo increased co-activation of deep core mm/lower kinetic chain with gait and  fitness exercises to minimize incontinence    Time  8    Period  Weeks    Status  Achieved      PT LONG TERM GOAL #5   Title  Pt will demo IND w/ modifications to his fitness routine to minimize pelvic floor dysfunctions    Time  6    Period  Weeks    Status  On-going      Additional Long Term Goals   Additional Long Term Goals  Yes      PT LONG TERM GOAL #6   Title  Pt will report increasing his time of leaving his house from 45 min to > 2 hours to  participate in public and social activities    Time  10    Period  Weeks    Status  Partially Met            Plan - 09/29/19 1156    Clinical Impression Statement  Pt demo'd good carry over with equal shoulder height but continued to required manual Tx to decrease mm tightness at R paraspinal/ T10-11 intercostals to further promote R trunk rotation. Manual Tx was applied to R glut med, the area where he still feels LBP upon waking. Anticipate manual Tx in both areas will help minimize remaining LBP issue. For  next session, plan to address pelvic/ abdominal area where he received radiation for prostate cancer in order to facilitate better outcome with pelvic floor exercises forcontinence.  Pt continues to benefit from skilled PT.   Personal Factors and Comorbidities  Comorbidity 2    Examination-Activity Limitations   Stand;Squat;Transfers;Continence;Locomotion Level;Lift;Bend    Stability/Clinical Decision Making  Evolving/Moderate complexity    Rehab Potential  Good    PT Frequency  1x / week    PT Duration  --   10   PT Treatment/Interventions  Neuromuscular re-education;Patient/family education;Stair training;Therapeutic activities;Therapeutic exercise;Manual techniques;Moist Heat    Consulted and Agree with Plan of Care  Patient       Patient will benefit from skilled therapeutic intervention in order to improve the following deficits and impairments:  Difficulty walking, Decreased mobility, Decreased balance, Impaired flexibility, Decreased coordination, Decreased safety awareness, Hypomobility, Decreased endurance, Decreased strength, Increased muscle spasms  Visit Diagnosis: No diagnosis found.     Problem List Patient Active Problem List   Diagnosis Date Noted  . Radiation proctitis   . Migraine 01/23/2019  . Cataract 01/23/2019  . Beta thalassemia trait 09/27/2018  . Anemia 09/23/2018  . Neutropenia ([place]) 09/23/2018  . History of hormone therapy 06/07/2018  . Prostate cancer ([place]) 12/10/2017  . Facet hypertrophy of lumbar region 06/13/2017  . DDD (degenerative disc disease), lumbar 06/13/2017  . Hyperlipidemia 09/09/2015  . Sciatica 09/09/2015  . Arthritis, lumbar spine 09/09/2015  . Tobacco abuse 09/09/2015  . Personal history of colonic polyps 09/09/2015    Jerl Mina ,PT, DPT, E-RYT t 09/29/2019, 2:53 PM  [place] MAIN [facility] SERVICES [address] [place], [place], [address] Phone: [phone]   Fax:  [phone]  Name: Jerry Pena MRN: [id number] Date of Birth: 03-10-1951

## 2019-09-30 ENCOUNTER — Ambulatory Visit: Payer: Medicare HMO

## 2019-10-06 ENCOUNTER — Telehealth: Payer: Self-pay | Admitting: Gastroenterology

## 2019-10-06 ENCOUNTER — Other Ambulatory Visit: Payer: Self-pay

## 2019-10-06 ENCOUNTER — Ambulatory Visit: Payer: Medicare HMO | Admitting: Physical Therapy

## 2019-10-06 DIAGNOSIS — R2689 Other abnormalities of gait and mobility: Secondary | ICD-10-CM

## 2019-10-06 DIAGNOSIS — R278 Other lack of coordination: Secondary | ICD-10-CM | POA: Diagnosis not present

## 2019-10-06 DIAGNOSIS — R29898 Other symptoms and signs involving the musculoskeletal system: Secondary | ICD-10-CM

## 2019-10-06 NOTE — Patient Instructions (Signed)
Abdominal massage   Gentle to release tightness -- flat fingers not digging deep    5 clockwise circles (  Lower R hip, upper R rib , L upper rib, L lower hip     Abdominal massage upward from L,  R , center to belly button 3 stroke x 3 , pressure is gentle and light with all fingers flat, not using finger tips

## 2019-10-06 NOTE — Therapy (Signed)
[place] MAIN [facility] SERVICES [address] [place], [place], [address] Phone: [phone]   Fax:  [phone]  Physical Therapy Treatment  Patient Details  Name: Jerry Pena MRN: [id number] Date of Birth: 01-23-1951 Referring Provider (PT): Erlene Quan    Encounter Date: 10/06/2019  PT End of Session - 10/06/19 1209    Visit Number  5    Number of Visits  10    Date for PT Re-Evaluation  11/03/19    PT Start Time  1110    PT Stop Time  1209    PT Time Calculation (min)  59 min    Activity Tolerance  Patient tolerated treatment well;No increased pain    Behavior During Therapy  WFL for tasks assessed/performed       Past Medical History:  Diagnosis Date  . Beta thalassemia trait 09/27/2018  . Cancer ([place]) 10/2017   Prostate  . DDD (degenerative disc disease), lumbar 06/13/2017   Lumbar imaging June 2018  . Elevated PSA 09/12/2017   Refer to urologist, Sept 2018  . Facet hypertrophy of lumbar region 06/13/2017   Lumbar imaging June 2018  . Hyperlipidemia     Past Surgical History:  Procedure Laterality Date  . CATARACT EXTRACTION W/ INTRAOCULAR LENS IMPLANT Left 3/ 28/2014   eyes  . CATARACT EXTRACTION W/ INTRAOCULAR LENS IMPLANT Right 04/23/2013  . COLONOSCOPY WITH PROPOFOL N/A 07/29/2019   Procedure: COLONOSCOPY WITH PROPOFOL;  Surgeon: Lin Landsman, MD;  Location[facility] ENDOSCOPY;  Service: Gastroenterology;  Laterality: N/A;  . PELVIC LYMPH NODE DISSECTION N/A 12/10/2017   Procedure: PELVIC LYMPH NODE DISSECTION;  Surgeon: [name], MD;  Location: [facility] ORS;  Service: Urology;  Laterality: N/A;  . PROSTATE SURGERY    . ROBOT ASSISTED LAPAROSCOPIC RADICAL PROSTATECTOMY N/A 12/10/2017   Procedure: ROBOTIC ASSISTED LAPAROSCOPIC RADICAL PROSTATECTOMY;  Surgeon: [name], MD;  Location: [facility] ORS;  Service: Urology;  Laterality: N/A;    There were no vitals filed for this visit.  Subjective Assessment - 10/06/19  1110    Subjective  Pt had no problems with his last exercises. Pt reported he had noticed once a month he had rectal bleeding. But after his colonoscopy in Aug, he noticed the rectal bleeding occurs more with constipation. The blood occurred in the stools and toilet paper. Pt plans to call his GI doctor to follow up with this problem. Pt was told with radiation that constipation would part of the sideeffects. Pt took stool softeners up to a certain point until they no longer helped. Currently pt is having bowel movements 2-3 x a week and still needs to take somethin. Pt takes Metimucil. Pt is worried about the blood in his stool. Pt drinks 48 fl oz of water a day.    Patient Stated Goals  slow the leakage or stop it all together         [facility] PT Assessment - 10/06/19 1258      Coordination   Gross Motor Movements are Fluid and Coordinated  --   limited passive co-activation of TrA      Palpation   Palpation comment  facial restriction R lateral border of bladder on lower quadrant of abdomen                    [facility] Adult PT Treatment/Exercise - 10/06/19 1256      Therapeutic Activites    Therapeutic Activities  Other Therapeutic Activities    Other Therapeutic Activities  self-massage over abdomen to promote intraabdominal pressure and motility       Modalities   Modalities  Moist Heat      Moist Heat Therapy   Number Minutes Moist Heat  6 Minutes    Moist Heat Location  --   abdomen      Manual Therapy   Manual therapy comments  fascial release over upepr and lower abdomen to promote passive co-activation of TrA and deep core coordination and optimal lengthening of pelvic and diaphramgatic excursion                PT Short Term Goals - 09/15/19 1112      PT SHORT TERM GOAL #1   Title  Pt will demo proper pelvic floor coordination and contraction in order to progress with pelvic floor strengthenign exercises post surgery    Time  1    Period  Days     Status  On-going      PT SHORT TERM GOAL #2   Title  Pt will be IND with HEP    Time  1    Period  Days    Status  Achieved      PT SHORT TERM GOAL #3   Title  Pt will demo proper coordination of deep core mm to minimzie strain on pelvic floor and abdominal mm with his self selected exercises with weight lifting and other core exercises    Time  1    Period  Weeks    Status  On-going        PT Long Term Goals - 09/29/19 1159      PT LONG TERM GOAL #1   Title  Pt will decrease IPSS score from 4 pts to < 2 pts, QOL: 1 pt to 0 pts in order to  improve continence  ( 10/5: 3pts , QOL  3 pts)    Time  10    Period  Weeks    Status  Partially Met      PT LONG TERM GOAL #2   Title  Pt will decrease his ODI score from 11% to < 5% in order to improve back function for ADLs and fitness ( 10/5: 3%    Time  8    Period  Weeks    Status  Achieved      PT LONG TERM GOAL #3   Title  Pt will demo increased L SIJ mobility, no increased mm tightness / R convex lumbar curve across 2 sessions in order to progress to hip extension strengthening exercises    Time  2    Period  Weeks    Status  Achieved      PT LONG TERM GOAL #4   Title  Pt will demo increased co-activation of deep core mm/lower kinetic chain with gait and  fitness exercises to minimize incontinence    Time  8    Period  Weeks    Status  Achieved      PT LONG TERM GOAL #5   Title  Pt will demo IND w/ modifications to his fitness routine to minimize pelvic floor dysfunctions    Time  6    Period  Weeks    Status  On-going      Additional Long Term Goals   Additional Long Term Goals  Yes      PT LONG TERM GOAL #6   Title  Pt will report increasing his time of leaving his house from  45 min to > 2 hours to  participate in public and social activities    Time  10    Period  Weeks    Status  Partially Met            Plan - 10/06/19 1210    Clinical Impression Statement  Pt demo'd improved pelvic floor/ abdominal/  diaphramgatic movement after fascial releases over upper and lower abdomen. Given pt's radiation therapy, suspect pliability of tissue is compromised for intraabdominal pressure system function and motility. Pt demo'd improved excursion of pelvic floor and diaphragm which will help with motility, bowel and urinary function.  Pt reported constipation issues s/p radiation and recently had colonscopy in August after which he noticed more frequent blood in stool , particularly when constipated.  Pt voiced he plans to contact his GI provider re: these Sx.  Plan to stay abreast on his GI Sx and findings after he sees his provider.  Holding caution to deep mobilization over visceral region as pt has Hx of CA and now currently having blood in his stool. Pt continues to benefit from skilled PT.     Personal Factors and Comorbidities  Comorbidity 2    Examination-Activity Limitations  Stand;Squat;Transfers;Continence;Locomotion Level;Lift;Bend    Stability/Clinical Decision Making  Evolving/Moderate complexity    Rehab Potential  Good    PT Frequency  1x / week    PT Duration  --   10   PT Treatment/Interventions  Neuromuscular re-education;Patient/family education;Stair training;Therapeutic activities;Therapeutic exercise;Manual techniques;Moist Heat    Consulted and Agree with Plan of Care  Patient       Patient will benefit from skilled therapeutic intervention in order to improve the following deficits and impairments:  Difficulty walking, Decreased mobility, Decreased balance, Impaired flexibility, Decreased coordination, Decreased safety awareness, Hypomobility, Decreased endurance, Decreased strength, Increased muscle spasms  Visit Diagnosis: Other lack of coordination  Other symptoms and signs involving the musculoskeletal system  Other abnormalities of gait and mobility     Problem List Patient Active Problem List   Diagnosis Date Noted  . Radiation proctitis   . Migraine 01/23/2019  .  Cataract 01/23/2019  . Beta thalassemia trait 09/27/2018  . Anemia 09/23/2018  . Neutropenia ([place]) 09/23/2018  . History of hormone therapy 06/07/2018  . Prostate cancer ([place]) 12/10/2017  . Facet hypertrophy of lumbar region 06/13/2017  . DDD (degenerative disc disease), lumbar 06/13/2017  . Hyperlipidemia 09/09/2015  . Sciatica 09/09/2015  . Arthritis, lumbar spine 09/09/2015  . Tobacco abuse 09/09/2015  . Personal history of colonic polyps 09/09/2015    Jerl Mina ,PT, DPT, E-RYT  10/06/2019, 1:00 PM  [place] MAIN [facility] SERVICES [address] [place], [place], [address] Phone: [phone]   Fax:  [phone]  Name: Deamonte Sayegh MRN: [id number] Date of Birth: 03/21/1951

## 2019-10-06 NOTE — Telephone Encounter (Signed)
Patient called l/m on v/m stating  since he had his colonoscopy in August 2020 with Dr Marius Ditch he is experiencing blood in his stool at least 2-3 a week since his procedure.

## 2019-10-07 ENCOUNTER — Ambulatory Visit: Payer: Self-pay | Admitting: *Deleted

## 2019-10-07 ENCOUNTER — Other Ambulatory Visit: Payer: Self-pay

## 2019-10-07 DIAGNOSIS — C61 Malignant neoplasm of prostate: Secondary | ICD-10-CM

## 2019-10-07 NOTE — Progress Notes (Signed)
Patient pre screened for office appointment, no questions or concerns today. 

## 2019-10-07 NOTE — Telephone Encounter (Signed)
Pt notified, to please do call Gi.  He states did contact and they have not returned call yet.  He states has an appt already scheduled with you for next Tuesday.

## 2019-10-07 NOTE — Telephone Encounter (Signed)
Please call pt to schedule for sometime next week or so for rectal bleeding with Dr. Marius Ditch.

## 2019-10-07 NOTE — Telephone Encounter (Signed)
Pt called with having rectal bleeding after having radiation therapy 8 weeks ago and a colonoscopy in August.  He stated that he had attempted to notify his GI provider but have not spoken with that provider yet. He denies dizziness, abd pain, fever or nausea.  He stated the bleeding has been  2 to 3 times a week now. Denies seeing clots but sees blood in his stool and when he wipes at times.  He has a hx of having constipation since the radiation treatment and is taking metamucil for it, sometimes taking it 2 or 3 times a day. Advised to call back for dizziness, having increase in bleeding with clots or nausea and fever. He voiced understanding. Per protocol, he should be seen within 3 days. He is requesting to have an appointment next week after Monday. Appointment given for Tuesday. Routing to [facility] for review.  Reason for Disposition . Radiation therapy to lower abdomen or pelvis  Answer Assessment - Initial Assessment Questions 1. APPEARANCE of BLOOD: "What color is it?" "Is it passed separately, on the surface of the stool, or mixed in with the stool?"      Red and mixed with stool  2. AMOUNT: "How much blood was passed?"      varies 3. FREQUENCY: "How many times has blood been passed with the stools?"      2 to 3 times a week 4. ONSET: "When was the blood first seen in the stools?" (Days or weeks)      Going on since radiation treatments 5. DIARRHEA: "Is there also some diarrhea?" If so, ask: "How many diarrhea stools were passed in past 24 hours?"      no 6. CONSTIPATION: "Do you have constipation?" If so, "How bad is it?"     Yes but taking metamucil 7. RECURRENT SYMPTOMS: "Have you had blood in your stools before?" If so, ask: "When was the last time?" and "What happened that time?"      The last you had bleeding was day before yesterday 8. BLOOD THINNERS: "Do you take any blood thinners?" (e.g., Coumadin/warfarin, Pradaxa/dabigatran, aspirin)     no 9.  OTHER SYMPTOMS: "Do you have any other symptoms?"  (e.g., abdominal pain, vomiting, dizziness, fever)     no 10. PREGNANCY: "Is there any chance you are pregnant?" "When was your last menstrual period?"       n/a  Protocols used: RECTAL BLEEDING-A-AH

## 2019-10-07 NOTE — Telephone Encounter (Signed)
Temeka can you please see about getting him scheduled.  thanks

## 2019-10-07 NOTE — Telephone Encounter (Signed)
Please call pt and schedule appt with Dr. Marius Ditch for rectal bleeding

## 2019-10-07 NOTE — Telephone Encounter (Signed)
No problem! Will have scheduling reach out to him. Thank you

## 2019-10-07 NOTE — Telephone Encounter (Signed)
Jerry Pena - can you reach out to GI for the patient to see if they can get him in over the next week or so?

## 2019-10-08 ENCOUNTER — Inpatient Hospital Stay (HOSPITAL_BASED_OUTPATIENT_CLINIC_OR_DEPARTMENT_OTHER): Payer: Medicare HMO | Admitting: Oncology

## 2019-10-08 ENCOUNTER — Other Ambulatory Visit: Payer: Self-pay

## 2019-10-08 ENCOUNTER — Encounter: Payer: Self-pay | Admitting: Oncology

## 2019-10-08 ENCOUNTER — Inpatient Hospital Stay: Payer: Medicare HMO | Attending: Oncology

## 2019-10-08 VITALS — BP 143/84 | HR 60 | Temp 96.9°F | Resp 16 | Wt 143.5 lb

## 2019-10-08 DIAGNOSIS — F17210 Nicotine dependence, cigarettes, uncomplicated: Secondary | ICD-10-CM | POA: Insufficient documentation

## 2019-10-08 DIAGNOSIS — D701 Agranulocytosis secondary to cancer chemotherapy: Secondary | ICD-10-CM | POA: Diagnosis not present

## 2019-10-08 DIAGNOSIS — Z79899 Other long term (current) drug therapy: Secondary | ICD-10-CM | POA: Diagnosis not present

## 2019-10-08 DIAGNOSIS — D509 Iron deficiency anemia, unspecified: Secondary | ICD-10-CM | POA: Insufficient documentation

## 2019-10-08 DIAGNOSIS — C61 Malignant neoplasm of prostate: Secondary | ICD-10-CM

## 2019-10-08 DIAGNOSIS — M5136 Other intervertebral disc degeneration, lumbar region: Secondary | ICD-10-CM | POA: Diagnosis not present

## 2019-10-08 DIAGNOSIS — E785 Hyperlipidemia, unspecified: Secondary | ICD-10-CM | POA: Insufficient documentation

## 2019-10-08 DIAGNOSIS — D563 Thalassemia minor: Secondary | ICD-10-CM | POA: Diagnosis not present

## 2019-10-08 DIAGNOSIS — Z8546 Personal history of malignant neoplasm of prostate: Secondary | ICD-10-CM | POA: Insufficient documentation

## 2019-10-08 LAB — CBC WITH DIFFERENTIAL/PLATELET
Abs Immature Granulocytes: 0.01 10*3/uL (ref 0.00–0.07)
Basophils Absolute: 0.1 10*3/uL (ref 0.0–0.1)
Basophils Relative: 1 %
Eosinophils Absolute: 0.1 10*3/uL (ref 0.0–0.5)
Eosinophils Relative: 3 %
HCT: 38.3 % — ABNORMAL LOW (ref 39.0–52.0)
Hemoglobin: 12 g/dL — ABNORMAL LOW (ref 13.0–17.0)
Immature Granulocytes: 0 %
Lymphocytes Relative: 40 %
Lymphs Abs: 1.7 10*3/uL (ref 0.7–4.0)
MCH: 22.6 pg — ABNORMAL LOW (ref 26.0–34.0)
MCHC: 31.3 g/dL (ref 30.0–36.0)
MCV: 72 fL — ABNORMAL LOW (ref 80.0–100.0)
Monocytes Absolute: 0.6 10*3/uL (ref 0.1–1.0)
Monocytes Relative: 13 %
Neutro Abs: 1.8 10*3/uL (ref 1.7–7.7)
Neutrophils Relative %: 43 %
Platelets: 282 10*3/uL (ref 150–400)
RBC: 5.32 MIL/uL (ref 4.22–5.81)
RDW: 16 % — ABNORMAL HIGH (ref 11.5–15.5)
WBC: 4.2 10*3/uL (ref 4.0–10.5)
nRBC: 0 % (ref 0.0–0.2)

## 2019-10-08 LAB — COMPREHENSIVE METABOLIC PANEL
ALT: 12 U/L (ref 0–44)
AST: 16 U/L (ref 15–41)
Albumin: 3.8 g/dL (ref 3.5–5.0)
Alkaline Phosphatase: 84 U/L (ref 38–126)
Anion gap: 7 (ref 5–15)
BUN: 16 mg/dL (ref 8–23)
CO2: 28 mmol/L (ref 22–32)
Calcium: 9.2 mg/dL (ref 8.9–10.3)
Chloride: 105 mmol/L (ref 98–111)
Creatinine, Ser: 0.88 mg/dL (ref 0.61–1.24)
GFR calc Af Amer: >60 mL/min (ref >60)
GFR calc non Af Amer: >60 mL/min (ref >60)
Glucose, Bld: 98 mg/dL (ref 70–99)
Potassium: 4.4 mmol/L (ref 3.5–5.1)
Sodium: 140 mmol/L (ref 135–145)
Total Bilirubin: 0.4 mg/dL (ref 0.3–1.2)
Total Protein: 7 g/dL (ref 6.5–8.1)

## 2019-10-08 NOTE — Progress Notes (Signed)
Hematology/Oncology Consult note [facility] [phone] [phone]   Patient Care Team: Hubbard Hartshorn, FNP as PCP - General (Family Medicine) [name], MD as Consulting Physician (Urology) Noreene Filbert, MD as Consulting Physician (Radiation Oncology)  REFERRING PROVIDER: Hubbard Hartshorn, FNP CHIEF COMPLAINTS/REASON FOR VISIT:  Evaluation of microcytic anemia. Neutropenia.   HISTORY OF PRESENTING ILLNESS:  Jerry Pena is a  68 y.o.  male with PMH listed below who was referred to me for evaluation of microcytic anemia and neutropenia. Patient recently had CBC done at PCPs office. 09/23/2018, hemoglobin 12.6, MCV 71.1.  WBC 3.4, differential showed absolute lymphocyte 826 Pathology smear showed myeloid population consist predominantly with mature segmented neutrophils with reactive changes.  No immature cells are identified.  RBCs appears to be microcytic and hypochromic on smear. Patient had iron panel done on 06/14/2018 which showed iron 83, TIBC 294, iron saturation 28, ferritin 272. He also had a hemoglobinopathy evaluation done on 09/23/2018, hemoglobin 8 to elevated 5.1, consistent with beta thalassemia trait with anemia.  Patient reports that he is feeling well at baseline.  Denies any fatigue, weight loss, fever or chills.  Denies any frequent infection. He tells me that his son also has anemia.  # He has a history prostate cancer and a follows up with Dr. Baruch Gouty. Gleason 7 (4+3 ) adenocarcinoma [p [id number]]. received radical robotic prostatectomy with bilateral pelvic lymph node dissection on 12/10/2017.  Status post both pelvic lymph node as well as prostate fossa radiation.  Per note decline ADT secondary to hot flash. Most recent PSA less than 0.1 04/19/2018.  INTERVAL HISTORY Jerry Pena is a 68 y.o. male who has above history reviewed by me today presents for follow up visit for management of anemia. Problems and  complaints are listed below: Patient reports feeling well.  No new complaints. He has history of prostate cancer status post robotic prostatectomy with bilateral pelvic lymph node dissection on 12/10/2017. Declined ADT.  Currently he follows up with radiation oncology and urology.  Most recent PSA on 08/12/2019 was less than 0.1.  Review of Systems  Constitutional: Negative for chills, fever, malaise/fatigue and weight loss.  HENT: Negative for nosebleeds and sore throat.   Eyes: Negative for double vision, photophobia and redness.  Respiratory: Negative for cough, shortness of breath and wheezing.   Cardiovascular: Negative for chest pain, palpitations, orthopnea and leg swelling.  Gastrointestinal: Negative for abdominal pain, blood in stool, nausea and vomiting.  Genitourinary: Negative for dysuria.       Nocturia  Musculoskeletal: Negative for back pain, myalgias and neck pain.  Skin: Negative for itching and rash.  Neurological: Negative for dizziness, tingling and tremors.  Endo/Heme/Allergies: Negative for environmental allergies. Does not bruise/bleed easily.  Psychiatric/Behavioral: Negative for depression and hallucinations.    MEDICAL HISTORY:  Past Medical History:  Diagnosis Date  . Beta thalassemia trait 09/27/2018  . Cancer ([place]) 10/2017   Prostate  . DDD (degenerative disc disease), lumbar 06/13/2017   Lumbar imaging June 2018  . Elevated PSA 09/12/2017   Refer to urologist, Sept 2018  . Facet hypertrophy of lumbar region 06/13/2017   Lumbar imaging June 2018  . Hyperlipidemia     SURGICAL HISTORY: Past Surgical History:  Procedure Laterality Date  . CATARACT EXTRACTION W/ INTRAOCULAR LENS IMPLANT Left 3/ 28/2014   eyes  . CATARACT EXTRACTION W/ INTRAOCULAR LENS IMPLANT Right 04/23/2013  . COLONOSCOPY WITH PROPOFOL N/A 07/29/2019   Procedure: COLONOSCOPY WITH PROPOFOL;  Surgeon: Marius Ditch,  Tally Due, MD;  Location: [place];  Service: Gastroenterology;   Laterality: N/A;  . PELVIC LYMPH NODE DISSECTION N/A 12/10/2017   Procedure: PELVIC LYMPH NODE DISSECTION;  Surgeon: [name], MD;  Location: [facility];  Service: Urology;  Laterality: N/A;  . PROSTATE SURGERY    . ROBOT ASSISTED LAPAROSCOPIC RADICAL PROSTATECTOMY N/A 12/10/2017   Procedure: ROBOTIC ASSISTED LAPAROSCOPIC RADICAL PROSTATECTOMY;  Surgeon: [name], MD;  Location: [facility];  Service: Urology;  Laterality: N/A;    SOCIAL HISTORY: Social History   Socioeconomic History  . Marital status: Divorced    Spouse name: Not on file  . Number of children: 4  . Years of education: Not on file  . Highest education level: Associate degree: academic program  Occupational History  . Occupation: Retired  Scientific laboratory technician  . Financial resource strain: Not hard at all  . Food insecurity    Worry: Never true    Inability: Never true  . Transportation needs    Medical: No    Non-medical: No  Tobacco Use  . Smoking status: Light Tobacco Smoker    Years: 48.00    Types: Cigars  . Smokeless tobacco: Never Used  . Tobacco comment: smokes 3 small cigars per day  Substance and Sexual Activity  . Alcohol use: Yes    Alcohol/week: 0.0 standard drinks    Comment: beer rarely  . Drug use: No    Comment: used marijuana more than 10 years ago  . Sexual activity: Not Currently  Lifestyle  . Physical activity    Days per week: 7 days    Minutes per session: 90 min  . Stress: Only a little  Relationships  . Social connections    Talks on phone: More than three times a week    Gets together: Twice a week    Attends religious service: More than 4 times per year    Active member of club or organization: No    Attends meetings of clubs or organizations: Never    Relationship status: Divorced  . Intimate partner violence    Fear of current or ex partner: No    Emotionally abused: No    Physically abused: No    Forced sexual activity: No  Other Topics Concern  . Not on file   Social History Narrative   Patient just buried his baby sister, Diane,10/2018    FAMILY HISTORY: Family History  Problem Relation Age of Onset  . Stroke Mother   . Stroke Father   . Cancer Sister        breast, lung  . Pulmonary Hypertension Sister   . Cancer Son        prostate  . Prostate cancer Son   . Hypertension Sister   . Bladder Cancer Neg Hx   . Kidney cancer Neg Hx     ALLERGIES:  is allergic to aspirin and penicillins.  MEDICATIONS:  Current Outpatient Medications  Medication Sig Dispense Refill  . acetaminophen (TYLENOL) 500 MG tablet Take 1,000 mg by mouth every 6 (six) hours as needed for moderate pain or headache.     . calcium-vitamin D (OSCAL WITH D) 500-200 MG-UNIT tablet Take 1 tablet by mouth.    . folic acid (FOLVITE) A999333 MCG tablet Take 1 tablet (400 mcg total) by mouth daily. 90 tablet 4  . ibuprofen (ADVIL,MOTRIN) 200 MG tablet Take 400 mg by mouth every 6 (six) hours as needed for headache or moderate pain.    . Melatonin  5 MG TABS Take 5 mg by mouth daily as needed.    . Multiple Vitamin (MULTIVITAMIN) tablet Take 1 tablet by mouth daily.    . psyllium (METAMUCIL) 58.6 % packet Take 1 packet by mouth daily.    . vitamin B-12 (CYANOCOBALAMIN) 100 MCG tablet Take 100 mcg by mouth daily.    . vitamin C (ASCORBIC ACID) 500 MG tablet Take 500 mg by mouth daily.     No current facility-administered medications for this visit.      PHYSICAL EXAMINATION: ECOG PERFORMANCE STATUS: 0 - Asymptomatic Vitals:   10/08/19 1019  BP: (!) 143/84  Pulse: 60  Resp: 16  Temp: (!) 96.9 F (36.1 C)   Filed Weights   10/08/19 1019  Weight: 143 lb 8 oz (65.1 kg)    Physical Exam Constitutional:      General: He is not in acute distress. HENT:     Head: Normocephalic and atraumatic.  Eyes:     General: No scleral icterus.    Pupils: Pupils are equal, round, and reactive to light.  Neck:     Musculoskeletal: Normal range of motion and neck supple.   Cardiovascular:     Rate and Rhythm: Normal rate and regular rhythm.     Heart sounds: Normal heart sounds.  Pulmonary:     Effort: Pulmonary effort is normal. No respiratory distress.     Breath sounds: No wheezing.  Abdominal:     General: Bowel sounds are normal. There is no distension.     Palpations: Abdomen is soft. There is no mass.     Tenderness: There is no abdominal tenderness.  Musculoskeletal: Normal range of motion.        General: No deformity.  Skin:    General: Skin is warm and dry.     Findings: No erythema or rash.  Neurological:     Mental Status: He is alert and oriented to person, place, and time.     Cranial Nerves: No cranial nerve deficit.     Coordination: Coordination normal.  Psychiatric:        Behavior: Behavior normal.        Thought Content: Thought content normal.      LABORATORY DATA:  I have reviewed the data as listed Lab Results  Component Value Date   WBC 4.2 10/08/2019   HGB 12.0 (L) 10/08/2019   HCT 38.3 (L) 10/08/2019   MCV 72.0 (L) 10/08/2019   PLT 282 10/08/2019   Recent Labs    06/13/19 1218 10/08/19 1009  NA 142 140  K 4.5 4.4  CL 107 105  CO2 28 28  GLUCOSE 87 98  BUN 13 16  CREATININE 0.91 0.88  CALCIUM 8.9 9.2  GFRNONAA 86 >60  GFRAA 99 >60  PROT 6.5 7.0  ALBUMIN  --  3.8  AST 15 16  ALT 9 12  ALKPHOS  --  84  BILITOT 0.5 0.4   Iron/TIBC/Ferritin/ %Sat    Component Value Date/Time   IRON 83 06/14/2018 0911   TIBC 294 06/14/2018 0911   FERRITIN 272 06/14/2018 0911   IRONPCTSAT 28 06/14/2018 0911       ASSESSMENT & PLAN:  1. Beta thalassemia trait   2. Prostate cancer (HCC)    #Anemia, mild, stable.  Labs are reviewed and discussed with patient. This is secondary to beta thalassemia trait.  Recommend patient to continue folic acid A999333 MCG daily. #History of prostate cancer, PSA has been monitored.  Currently monitored  between radiation oncology and urology. Should he progress this in the future,  be happy to evaluate him for treatments if needed.  Orders Placed This Encounter  Procedures  . CBC with Differential/Platelet    Standing Status:   Future    Standing Expiration Date:   10/07/2021  . Comprehensive metabolic panel    Standing Status:   Future    Standing Expiration Date:   10/07/2021    All questions were answered. The patient knows to call the clinic with any problems questions or concerns.  Return of visit: 1 year   Earlie Server, MD, PhD Hematology Oncology [facility] at [facility] Pager- [phone] 10/08/2019

## 2019-10-09 ENCOUNTER — Ambulatory Visit (INDEPENDENT_AMBULATORY_CARE_PROVIDER_SITE_OTHER): Payer: Medicare HMO

## 2019-10-09 DIAGNOSIS — Z23 Encounter for immunization: Secondary | ICD-10-CM

## 2019-10-13 ENCOUNTER — Other Ambulatory Visit: Payer: Self-pay

## 2019-10-13 ENCOUNTER — Ambulatory Visit: Payer: Medicare HMO | Admitting: Physical Therapy

## 2019-10-13 DIAGNOSIS — R29898 Other symptoms and signs involving the musculoskeletal system: Secondary | ICD-10-CM

## 2019-10-13 DIAGNOSIS — R278 Other lack of coordination: Secondary | ICD-10-CM

## 2019-10-13 DIAGNOSIS — R2689 Other abnormalities of gait and mobility: Secondary | ICD-10-CM | POA: Diagnosis not present

## 2019-10-13 NOTE — Patient Instructions (Signed)
Stretch given today  1. Glute/sciatic stretch (10-20 reps- as the right sciatic pain relaxes).  -- Lay on your back, moving your hip slightly more onto your left (scooting hips to your side), right hip slightly more up.  -- with a taught belt or towel, bring your right knee towards your chest (diaganol direction).   2. Mini Squat, Bicep Curls 2 sets of 10  -- blue thera-band under the feet, sit back (mini squat) like the low back stretch (inhale), during exhalation stand up and do bicep curl. -- good tips: tuck your chin, and make sure neck is in line with your back. (neutral head/neck position).  -- wrists are neutral and elbows tucked into side.   Self Care: Seated pelvic tilts -- rock too far forward and rock too far back to find neutral- looking to find the natural curve. Not an exercise, good to find neutral position with exercises and good sitting posture.

## 2019-10-13 NOTE — Therapy (Signed)
[place] MAIN [facility] SERVICES [address] [place], [place], [address] Phone: [phone]   Fax:  [phone]  Physical Therapy Treatment    Patient Details  Name: Jerry Pena MRN: [id number] Date of Birth: Jun 16, 1951 Referring Provider (PT): Erlene Quan    Encounter Date: 10/13/2019  PT End of Session - 10/13/19 1115    Visit Number  6    Number of Visits  10    Date for PT Re-Evaluation  11/03/19    PT Start Time  1104    PT Stop Time  1203    PT Time Calculation (min)  59 min    Activity Tolerance  Patient tolerated treatment well;No increased pain    Behavior During Therapy  WFL for tasks assessed/performed       Past Medical History:  Diagnosis Date  . Beta thalassemia trait 09/27/2018  . Cancer ([place]) 10/2017   Prostate  . DDD (degenerative disc disease), lumbar 06/13/2017   Lumbar imaging June 2018  . Elevated PSA 09/12/2017   Refer to urologist, Sept 2018  . Facet hypertrophy of lumbar region 06/13/2017   Lumbar imaging June 2018  . Hyperlipidemia     Past Surgical History:  Procedure Laterality Date  . CATARACT EXTRACTION W/ INTRAOCULAR LENS IMPLANT Left 3/ 28/2014   eyes  . CATARACT EXTRACTION W/ INTRAOCULAR LENS IMPLANT Right 04/23/2013  . COLONOSCOPY WITH PROPOFOL N/A 07/29/2019   Procedure: COLONOSCOPY WITH PROPOFOL;  Surgeon: Lin Landsman, MD;  [facility];  Service: Gastroenterology;  Laterality: N/A;  . PELVIC LYMPH NODE DISSECTION N/A 12/10/2017   Procedure: PELVIC LYMPH NODE DISSECTION;  Surgeon: [name], MD;  Location: [facility];  Service: Urology;  Laterality: N/A;  . PROSTATE SURGERY    . ROBOT ASSISTED LAPAROSCOPIC RADICAL PROSTATECTOMY N/A 12/10/2017   Procedure: ROBOTIC ASSISTED LAPAROSCOPIC RADICAL PROSTATECTOMY;  Surgeon: [name], MD;  Location: [facility];  Service: Urology;  Laterality: N/A;    There were no vitals filed for this visit.  Subjective Assessment - 10/13/19  1112    Subjective  Pt noticed a twinge in his back after last session and he did his stretches and the twinge went away. Before doing pelvic PT, pt used to have leakage everything he went to the toilet. Last week, pt noticed 2 out 5 times, pt had no leakage by the time he got to the toilet. The urgency is ont as bad and just about to disappear. Pt's constipation has been "so so". The Metamucil stopped working and he has found chocolate milk to help him stay regular. Pt saw his cancer doctor last week and he was told everything looked good. Pt will be seeing his GI tomorrow about the blood in his stool. The last couple of days he has not noticed the blood in his stool.    Patient Stated Goals  slow the leakage or stop it all together         [facility] PT Assessment - 10/13/19 1502      Coordination   Gross Motor Movements are Fluid and Coordinated  --   improved deep core coordination      Palpation   Palpation comment  R QL tightness with more tightness at iliac crest area, fascial tightness on R lower quadrant ( decreased post Tx)                    [facility] Adult PT Treatment/Exercise - 10/13/19 1503      Therapeutic  Activites    Other Therapeutic Activities  modification and epxlanation to positions with bicep curls using dumbbell while minimize mm  tightness       Neuro Re-ed    Neuro Re-ed Details   cued for proper pelvic tilt       Manual Therapy   Manual therapy comments  QL distraction, fascial releases over R low quadrant                PT Short Term Goals - 09/15/19 1112      PT SHORT TERM GOAL #1   Title  Pt will demo proper pelvic floor coordination and contraction in order to progress with pelvic floor strengthenign exercises post surgery    Time  1    Period  Days    Status  On-going      PT SHORT TERM GOAL #2   Title  Pt will be IND with HEP    Time  1    Period  Days    Status  Achieved      PT SHORT TERM GOAL #3   Title  Pt will demo proper  coordination of deep core mm to minimzie strain on pelvic floor and abdominal mm with his self selected exercises with weight lifting and other core exercises    Time  1    Period  Weeks    Status  On-going        PT Long Term Goals - 09/29/19 1159      PT LONG TERM GOAL #1   Title  Pt will decrease IPSS score from 4 pts to < 2 pts, QOL: 1 pt to 0 pts in order to  improve continence  ( 10/5: 3pts , QOL  3 pts)    Time  10    Period  Weeks    Status  Partially Met      PT LONG TERM GOAL #2   Title  Pt will decrease his ODI score from 11% to < 5% in order to improve back function for ADLs and fitness ( 10/5: 3%    Time  8    Period  Weeks    Status  Achieved      PT LONG TERM GOAL #3   Title  Pt will demo increased L SIJ mobility, no increased mm tightness / R convex lumbar curve across 2 sessions in order to progress to hip extension strengthening exercises    Time  2    Period  Weeks    Status  Achieved      PT LONG TERM GOAL #4   Title  Pt will demo increased co-activation of deep core mm/lower kinetic chain with gait and  fitness exercises to minimize incontinence    Time  8    Period  Weeks    Status  Achieved      PT LONG TERM GOAL #5   Title  Pt will demo IND w/ modifications to his fitness routine to minimize pelvic floor dysfunctions    Time  6    Period  Weeks    Status  On-going      Additional Long Term Goals   Additional Long Term Goals  Yes      PT LONG TERM GOAL #6   Title  Pt will report increasing his time of leaving his house from 45 min to > 2 hours to  participate in public and social activities    Time  10  Period  Weeks    Status  Partially Met            Plan - 10/13/19 1506    Clinical Impression Statement  Pt is progressing well with report of less urge and more continence 3 out 5 trips to the bathroom. Pt demo significantly improved spinal/pelvic alignment and mobility without one sided imbalances. Pt also demo improved deep core  coordination and strength and has progressed with endurance pelvic floor mm. Applied more manual Tx today to minimize back mm tightness and release lower R quadrant abdominal fascial restrictions 2/2 radiation Tx. Pt demo'd increased co-activation of TrA with more caudal movement of pelvic floor post Tx. Pt continues to benefit from skilled PT. Plan to perform progress note at  next session.   Personal Factors and Comorbidities  Comorbidity 2    Examination-Activity Limitations  Stand;Squat;Transfers;Continence;Locomotion Level;Lift;Bend    Stability/Clinical Decision Making  Evolving/Moderate complexity    Rehab Potential  Good    PT Frequency  Biweekly    PT Duration  --   10   PT Treatment/Interventions  Neuromuscular re-education;Patient/family education;Stair training;Therapeutic activities;Therapeutic exercise;Manual techniques;Moist Heat    Consulted and Agree with Plan of Care  Patient       Patient will benefit from skilled therapeutic intervention in order to improve the following deficits and impairments:  Difficulty walking, Decreased mobility, Decreased balance, Impaired flexibility, Decreased coordination, Decreased safety awareness, Hypomobility, Decreased endurance, Decreased strength, Increased muscle spasms  Visit Diagnosis: Other lack of coordination  Other symptoms and signs involving the musculoskeletal system  Other abnormalities of gait and mobility     Problem List Patient Active Problem List   Diagnosis Date Noted  . Radiation proctitis   . Migraine 01/23/2019  . Cataract 01/23/2019  . Beta thalassemia trait 09/27/2018  . Anemia 09/23/2018  . Neutropenia ([place]) 09/23/2018  . History of hormone therapy 06/07/2018  . Prostate cancer ([place]) 12/10/2017  . Facet hypertrophy of lumbar region 06/13/2017  . DDD (degenerative disc disease), lumbar 06/13/2017  . Hyperlipidemia 09/09/2015  . Sciatica 09/09/2015  . Arthritis, lumbar spine 09/09/2015  . Tobacco  abuse 09/09/2015  . Personal history of colonic polyps 09/09/2015    Jerl Mina ,PT, DPT, [id number]  10/13/2019, 3:08 PM  [place] MAIN [facility] SERVICES [address] [place], [place], [address] Phone: [phone]   Fax:  [phone]  Name: Jerry Pena MRN: [id number] Date of Birth: [birth year]

## 2019-10-14 ENCOUNTER — Other Ambulatory Visit: Payer: Self-pay

## 2019-10-14 ENCOUNTER — Ambulatory Visit: Payer: Self-pay | Admitting: Family Medicine

## 2019-10-14 ENCOUNTER — Telehealth: Payer: Self-pay | Admitting: Urology

## 2019-10-14 ENCOUNTER — Ambulatory Visit (INDEPENDENT_AMBULATORY_CARE_PROVIDER_SITE_OTHER): Payer: Medicare HMO | Admitting: Gastroenterology

## 2019-10-14 ENCOUNTER — Encounter: Payer: Self-pay | Admitting: Gastroenterology

## 2019-10-14 VITALS — BP 109/73 | HR 67 | Temp 98.4°F | Resp 16 | Ht 68.0 in | Wt 143.2 lb

## 2019-10-14 DIAGNOSIS — K625 Hemorrhage of anus and rectum: Secondary | ICD-10-CM | POA: Diagnosis not present

## 2019-10-14 DIAGNOSIS — K5909 Other constipation: Secondary | ICD-10-CM | POA: Insufficient documentation

## 2019-10-14 NOTE — Progress Notes (Signed)
Cephas Darby, MD [address]  [place], [place] [address]  Main: [phone]  Fax: [phone]    Gastroenterology Consultation  Referring Provider:     Arnetha Courser, MD Primary Care Physician:  Hubbard Hartshorn, FNP Primary Gastroenterologist:  Dr. Cephas Darby Reason for Consultation:    Rectal bleeding, chronic constipation        HPI:   Jerry Pena is a 68 y.o. male referred by Dr. Uvaldo Rising, Astrid Divine, FNP  for consultation & management of rectal bleeding, chronic constipation.  Patient had history of prostate cancer, s/p prostatectomy and radiation treatment in 2018, history of beta thalassemia trait.  Patient has been experiencing intermittent rectal bleeding for last 1 month or so, particularly when his stools are hard.  He describes it as bright red blood per rectum on surface of the stool as well as scant amount on wiping only.  He denies blood clots, black stools.  He reports having severe constipation for more than a year and consistency on Bristol stool scale as 1.  He tried Senokot, Metamucil which did not help long-term.  He reports having bowel movements about 3 times a week, has abdominal bloating.  He underwent surveillance colonoscopy in 07/2019 which revealed small tubular adenomas as well as radiation proctitis.  His hemoglobin is stable. He does not smoke or drink alcohol  NSAIDs: None  Antiplts/Anticoagulants/Anti thrombotics: None  GI Procedures: Colonoscopy 07/29/2019 - Three diminutive polyps in the transverse colon and in the cecum, removed with a cold biopsy forceps. Resected and retrieved. - Diverticulosis in the sigmoid colon. - Multiple non-bleeding colonic angioectasias consistent with radiation proctitis  DIAGNOSIS:  A. COLON POLYP X3, RIGHT; COLD BIOPSY:  - TUBULAR ADENOMA, 2 FRAGMENTS.  - INTRAMUCOSAL LYMPHOID AGGREGATE, ONE FRAGMENT.  - NEGATIVE FOR HIGH-GRADE DYSPLASIA AND MALIGNANCY.    Past Medical History:  Diagnosis  Date  . Beta thalassemia trait 09/27/2018  . Cancer ([place]) 10/2017   Prostate  . DDD (degenerative disc disease), lumbar 06/13/2017   Lumbar imaging June 2018  . Elevated PSA 09/12/2017   Refer to urologist, Sept 2018  . Facet hypertrophy of lumbar region 06/13/2017   Lumbar imaging June 2018  . Hyperlipidemia     Past Surgical History:  Procedure Laterality Date  . CATARACT EXTRACTION W/ INTRAOCULAR LENS IMPLANT Left 3/ 28/2014   eyes  . CATARACT EXTRACTION W/ INTRAOCULAR LENS IMPLANT Right 04/23/2013  . COLONOSCOPY WITH PROPOFOL N/A 07/29/2019   Procedure: COLONOSCOPY WITH PROPOFOL;  Surgeon: Lin Landsman, MD;  Location: [facility] ENDOSCOPY;  Service: Gastroenterology;  Laterality: N/A;  . PELVIC LYMPH NODE DISSECTION N/A 12/10/2017   Procedure: PELVIC LYMPH NODE DISSECTION;  Surgeon: [name], MD;  Location: [facility] ORS;  Service: Urology;  Laterality: N/A;  . PROSTATE SURGERY    . ROBOT ASSISTED LAPAROSCOPIC RADICAL PROSTATECTOMY N/A 12/10/2017   Procedure: ROBOTIC ASSISTED LAPAROSCOPIC RADICAL PROSTATECTOMY;  Surgeon: [name], MD;  Location: [facility] ORS;  Service: Urology;  Laterality: N/A;    Current Outpatient Medications:  .  acetaminophen (TYLENOL) 500 MG tablet, Take 1,000 mg by mouth every 6 (six) hours as needed for moderate pain or headache. , Disp: , Rfl:  .  calcium-vitamin D (OSCAL WITH D) 500-200 MG-UNIT tablet, Take 1 tablet by mouth., Disp: , Rfl:  .  folic acid (FOLVITE) A999333 MCG tablet, Take 1 tablet (400 mcg total) by mouth daily., Disp: 90 tablet, Rfl: 4 .  ibuprofen (ADVIL,MOTRIN) 200 MG tablet, Take  400 mg by mouth every 6 (six) hours as needed for headache or moderate pain., Disp: , Rfl:  .  Melatonin 5 MG TABS, Take 5 mg by mouth daily as needed., Disp: , Rfl:  .  Multiple Vitamin (MULTIVITAMIN) tablet, Take 1 tablet by mouth daily., Disp: , Rfl:  .  psyllium (METAMUCIL) 58.6 % packet, Take 1 packet by mouth daily., Disp: , Rfl:  .  vitamin B-12  (CYANOCOBALAMIN) 100 MCG tablet, Take 100 mcg by mouth daily., Disp: , Rfl:  .  vitamin C (ASCORBIC ACID) 500 MG tablet, Take 500 mg by mouth daily., Disp: , Rfl:    Family History  Problem Relation Age of Onset  . Stroke Mother   . Stroke Father   . Cancer Sister        breast, lung  . Pulmonary Hypertension Sister   . Cancer Son        prostate  . Prostate cancer Son   . Hypertension Sister   . Bladder Cancer Neg Hx   . Kidney cancer Neg Hx      Social History   Tobacco Use  . Smoking status: Light Tobacco Smoker    Years: 48.00    Types: Cigars  . Smokeless tobacco: Never Used  . Tobacco comment: smokes 3 small cigars per day  Substance Use Topics  . Alcohol use: Yes    Alcohol/week: 0.0 standard drinks    Comment: beer rarely  . Drug use: No    Comment: used marijuana more than 10 years ago    Allergies as of 10/14/2019 - Review Complete 10/14/2019  Allergen Reaction Noted  . Aspirin Other (See Comments) 11/20/2017  . Penicillins Hives and Other (See Comments) 09/09/2015    Review of Systems:    All systems reviewed and negative except where noted in HPI.   Physical Exam:  BP 109/73 (BP Location: Left Arm, Patient Position: Sitting, Cuff Size: Large)   Pulse 67   Temp 98.4 F (36.9 C)   Resp 16   Ht 5\' 8"  (1.727 m)   Wt 143 lb 3.2 oz (65 kg)   BMI 21.77 kg/m  No LMP for male patient.  General:   Alert,  Well-developed, well-nourished, pleasant and cooperative in NAD Head:  Normocephalic and atraumatic. Eyes:  Sclera clear, no icterus.   Conjunctiva pink. Ears:  Normal auditory acuity. Nose:  No deformity, discharge, or lesions. Mouth:  No deformity or lesions,oropharynx pink & moist. Neck:  Supple; no masses or thyromegaly. Lungs:  Respirations even and unlabored.  Clear throughout to auscultation.   No wheezes, crackles, or rhonchi. No acute distress. Heart:  Regular rate and rhythm; no murmurs, clicks, rubs, or gallops. Abdomen:  Normal bowel  sounds. Soft, non-tender and mildly distended, tympanic without masses, hepatosplenomegaly or hernias noted.  No guarding or rebound tenderness.   Rectal: Not performed Msk:  Symmetrical without gross deformities. Good, equal movement & strength bilaterally. Pulses:  Normal pulses noted. Extremities:  No clubbing or edema.  No cyanosis. Neurologic:  Alert and oriented x3;  grossly normal neurologically. Skin:  Intact without significant lesions or rashes. No jaundice. Psych:  Alert and cooperative. Normal mood and affect.  Imaging Studies: No abdominal imaging  Assessment and Plan:   Heather Zeron is a 68 y.o. male with history of prostate cancer s/p prostatectomy and radiation treatment in 2018 seen in consultation for intermittent bright red blood per rectum as well as chronic constipation  Chronic constipation Recommend high-fiber diet, information  provided Fiber supplements and stool softener such as MiraLAX Adequate intake of water  Rectal bleeding Likely secondary to bleeding from angiectasia is in rectum secondary to severe constipation Recommend flexible sigmoidoscopy for treatment of radiation proctitis if bleeding persists after treating constipation Advised patient to call my office if above symptoms persist  Follow up in 2 to 3 months or as needed   Cephas Darby, MD

## 2019-10-14 NOTE — Telephone Encounter (Signed)
Pt called and states that with his history of prostate cancer and his oldest son's he wants to know when should his youngest son be checked (he is 67 now)

## 2019-10-14 NOTE — Telephone Encounter (Signed)
PSA check at 40 then if very low, again at 44 and so on.  [name], MD

## 2019-10-14 NOTE — Patient Instructions (Signed)

## 2019-10-16 NOTE — Telephone Encounter (Signed)
Left VM with detailed messages verified DPR-asked to return call with any questions.

## 2019-10-27 ENCOUNTER — Ambulatory Visit: Payer: Medicare HMO | Attending: Urology | Admitting: Physical Therapy

## 2019-10-27 ENCOUNTER — Other Ambulatory Visit: Payer: Self-pay

## 2019-10-27 DIAGNOSIS — R278 Other lack of coordination: Secondary | ICD-10-CM | POA: Insufficient documentation

## 2019-10-27 DIAGNOSIS — R2689 Other abnormalities of gait and mobility: Secondary | ICD-10-CM | POA: Diagnosis not present

## 2019-10-27 DIAGNOSIS — R29898 Other symptoms and signs involving the musculoskeletal system: Secondary | ICD-10-CM | POA: Insufficient documentation

## 2019-10-27 NOTE — Therapy (Addendum)
[place] MAIN [facility] SERVICES [address] [place], [place], [address] Phone: [phone]   Fax:  [phone]  Physical Therapy Treatment / Progress Note   Patient Details  Name: Jerry Pena MRN: [id number] Date of Birth: 01-10-1951 Referring Provider (PT): Erlene Quan    Encounter Date: 10/27/2019  PT End of Session - 10/27/19 1109    Visit Number  7    Number of Visits     Date for PT Re-Evaluation 01/05/2020     PT Start Time  1103    PT Stop Time  1200    PT Time Calculation (min)  57 min    Activity Tolerance  Patient tolerated treatment well;No increased pain    Behavior During Therapy  WFL for tasks assessed/performed       Past Medical History:  Diagnosis Date  . Beta thalassemia trait 09/27/2018  . Cancer ([place]) 10/2017   Prostate  . DDD (degenerative disc disease), lumbar 06/13/2017   Lumbar imaging June 2018  . Elevated PSA 09/12/2017   Refer to urologist, Sept 2018  . Facet hypertrophy of lumbar region 06/13/2017   Lumbar imaging June 2018  . Hyperlipidemia     Past Surgical History:  Procedure Laterality Date  . CATARACT EXTRACTION W/ INTRAOCULAR LENS IMPLANT Left 3/ 28/2014   eyes  . CATARACT EXTRACTION W/ INTRAOCULAR LENS IMPLANT Right 04/23/2013  . COLONOSCOPY WITH PROPOFOL N/A 07/29/2019   Procedure: COLONOSCOPY WITH PROPOFOL;  Surgeon: Lin Landsman, MD;  Location: [facility] ENDOSCOPY;  Service: Gastroenterology;  Laterality: N/A;  . PELVIC LYMPH NODE DISSECTION N/A 12/10/2017   Procedure: PELVIC LYMPH NODE DISSECTION;  Surgeon: [name], MD;  Location: [facility] ORS;  Service: Urology;  Laterality: N/A;  . PROSTATE SURGERY    . ROBOT ASSISTED LAPAROSCOPIC RADICAL PROSTATECTOMY N/A 12/10/2017   Procedure: ROBOTIC ASSISTED LAPAROSCOPIC RADICAL PROSTATECTOMY;  Surgeon: [name], MD;  Location: [facility] ORS;  Service: Urology;  Laterality: N/A;    There were no vitals filed for this visit.  Subjective Assessment  - 10/27/19 1140    Subjective  Pt saw his GI doctor and was explained that the blood in his stool is coming from the blood vessels in his colon get exposed to the radiation he had with the cancer Tx. Pt is to monitor blood if it persists until mid Dec and to f/u with MD.  Urinary leakage is getting better. Pt is no longer leaking when he is undressing. Whereas before, he would leak while unbuckling his pants. Pt is able to leave his house for all day without feeling nervous about leakage.  In the past 2 weeks, pt has no LBP and was able to move TV with no soreness/ tightenss.         [facility] PT Assessment - 11/10/19 1206      Observation/Other Assessments   Observations  excessive cues for proper timing for upepr trap depression prior to exertion with bands, cued required for cervical retraction                    [facility] Adult PT Treatment/Exercise - 11/10/19 1206      Neuro Re-ed    Neuro Re-ed Details   excessive cues for proper technique and alignment in resistance bands exercises       Exercises   Exercises  --   see pt instructions,customized for thocolumbar/ glut stren              PT Short Term  Goals - 11/10/19 1204      PT SHORT TERM GOAL #1   Title  Pt will demo proper pelvic floor coordination and contraction in order to progress with pelvic floor strengthenign exercises post surgery    Time  1    Period  Days    Status  Achieved      PT SHORT TERM GOAL #2   Title  Pt will be IND with HEP    Time  1    Period  Days    Status  Achieved      PT SHORT TERM GOAL #3   Title  Pt will demo proper coordination of deep core mm to minimzie strain on pelvic floor and abdominal mm with his self selected exercises with weight lifting and other core exercises    Time  1    Period  Weeks    Status  Achieved        PT Long Term Goals - 11/10/19 1113      PT LONG TERM GOAL #1   Title  Pt will decrease IPSS score from 4 pts to < 2 pts, QOL: 1 pt to 0 pts in  order to  improve continence  ( 10/5: 3pts , QOL  3 pts, 11/2: 2 pts, QOL 0 pts)    Time  10    Period  Weeks    Status  Achieved      PT LONG TERM GOAL #2   Title  Pt will decrease his ODI score from 11% to < 5% in order to improve back function for ADLs and fitness ( 10/5: 3%    Time  8    Period  Weeks    Status  Achieved      PT LONG TERM GOAL #3   Title  Pt will demo increased L SIJ mobility, no increased mm tightness / R convex lumbar curve across 2 sessions in order to progress to hip extension strengthening exercises    Time  2    Period  Weeks    Status  Achieved      PT LONG TERM GOAL #4   Title  Pt will demo increased co-activation of deep core mm/lower kinetic chain with gait and  fitness exercises to minimize incontinence    Time  8    Period  Weeks    Status  Achieved      PT LONG TERM GOAL #5   Title  Pt will demo IND w/ modifications to his fitness routine to minimize pelvic floor dysfunctions    Time  6    Period  Weeks    Status  Achieved      PT LONG TERM GOAL #6   Title  Pt will report increasing his time of leaving his house from 45 min to > 2 hours to  participate in public and social activities    Time  10    Period  Weeks    Status  Achieved            Plan - 10/27/19 1110    Clinical Impression Statement Across the past 7 visits, pt has achieved 5/6 goals.  Pt is no longer leaking when he doffing pants at the toilet. Pt is able to leave his house for all day without feeling nervous about leakage.  In the past 2 weeks, pt has no LBP and was able to move TV with no soreness/ tightness.   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 Pt's SIJ mobility has been restored. Pt demo'd proper deep core coordination and strength. Pt's abdominopelvic fascial mobility has improved after being subject to radiation. These improvements have positively improved his  functional activities participation. Remaining sessions are focused on integrating pt into his fitness routine with resistance bands and weights. Pt enjoyed working out at [employer] and will not be returning to the gym due to [place] pandemic. Resistance bands and dumbbells are alternatives to gym machines. Customized exercises with bands and dumbbells are educated with neuromuscular reeducation for proper alignment and technique to decrease risk for relapse of Sx and impairments and decrease risk for injuries.   Pt continues to benefit from skilled PT.   Update on blood in his stool: Pt saw his GI doctor and was explained that the blood in his stool is coming from the blood vessels in his colon get exposed to the radiation he had with the cancer Tx. Pt is to monitor blood if it persists until mid Dec and to f/u with MD.    Personal Factors and Comorbidities  Comorbidity 2    Examination-Activity Limitations  Stand;Squat;Transfers;Continence;Locomotion Level;Lift;Bend    Stability/Clinical Decision Making  Evolving/Moderate complexity    Rehab Potential  Good    PT Frequency  Biweekly    PT Duration  --   10   PT Treatment/Interventions  Neuromuscular re-education;Patient/family education;Stair training;Therapeutic activities;Therapeutic exercise;Manual techniques;Moist Heat    Consulted and Agree with Plan of Care  Patient       Patient will benefit from skilled therapeutic intervention in order to improve the following deficits and impairments:  Difficulty walking, Decreased mobility, Decreased balance, Impaired flexibility, Decreased coordination, Decreased safety awareness, Hypomobility, Decreased endurance, Decreased strength, Increased muscle spasms  Visit Diagnosis: Other lack of coordination  Other symptoms and signs involving the musculoskeletal system  Other abnormalities of gait and mobility     Problem List Patient Active Problem List   Diagnosis Date Noted  . Chronic  constipation 10/14/2019  . Rectal bleeding 10/14/2019  . Radiation proctitis   . Migraine 01/23/2019  . Cataract 01/23/2019  . Beta thalassemia trait 09/27/2018  . Neutropenia ([place]) 09/23/2018  . History of hormone therapy 06/07/2018  . Prostate cancer ([place]) 12/10/2017  . Facet hypertrophy of lumbar region 06/13/2017  . DDD (degenerative disc disease), lumbar 06/13/2017  . Hyperlipidemia 09/09/2015  . Sciatica 09/09/2015  . Arthritis, lumbar spine 09/09/2015  . Tobacco abuse 09/09/2015  . Personal history of colonic polyps 09/09/2015    Jerl Mina  ,PT, DPT, E-RYT   11/10/2019, 12:07 PM  [place] MAIN [facility] SERVICES [address] [place], [place], [address] Phone: [phone]   Fax:  [phone]  Name: Ilijah Frappier MRN: [id number] Date of Birth: 16-Mar-1951

## 2019-10-27 NOTE — Patient Instructions (Addendum)
With your silver sneaker red bands:  Ax thrower, band under back foot Lunge position Elbows by ears, chin tucked like holding an apple  Hold handle thumbs down  Inhale,   exhale, let shoulders down  then "throw ax", straighten elbows  20 reps Then switch legs  And do 20 reps   ___   Hip extension   Band under L foot, L hand on the doorway, body turned 15 deg to the R  Hold band in R hand at ribs, elbow bent, keep in place Place R foot on band Inhale  Exhale, Kick R foot back  20 reps each side    ___   Lat pull down  band on other side of the door knob  Stand with back facing the door Mini squat  17 in from the door is the length of your band Inhale, exhale, pull bands with pinky side down, elbows down  20reps    __  Stretches: 5 breaths each   Figure -4 stretch with the opp leg out , knees not bend as much ( until you get the flexibility)   Cross foot over thigh, opp twist

## 2019-11-04 ENCOUNTER — Telehealth: Payer: Self-pay | Admitting: Gastroenterology

## 2019-11-04 NOTE — Telephone Encounter (Signed)
Temeka, please find out if his constipation has resolved.  If not, ask him if he is interested to try medication, then he can get samples of Amitiza 8 MCG 2 times a day.    If his constipation has resolved and rectal bleeding is ongoing, recommend flexible sigmoidoscopy to treat radiation proctitis  Thanks Rohini Vanga

## 2019-11-04 NOTE — Telephone Encounter (Signed)
Pt is calling he has noticed blood clots in his stool and on the toilet paper and wanted to let Dr. Marius Ditch know please call   cb [phone]

## 2019-11-06 ENCOUNTER — Other Ambulatory Visit: Payer: Self-pay

## 2019-11-06 DIAGNOSIS — K625 Hemorrhage of anus and rectum: Secondary | ICD-10-CM

## 2019-11-06 NOTE — Telephone Encounter (Signed)
Procedure has been scheduled for 11/14/2019, pt has been notified and verbalized understanding

## 2019-11-10 ENCOUNTER — Other Ambulatory Visit: Payer: Self-pay

## 2019-11-10 ENCOUNTER — Ambulatory Visit: Payer: Medicare HMO | Admitting: Physical Therapy

## 2019-11-10 DIAGNOSIS — R2689 Other abnormalities of gait and mobility: Secondary | ICD-10-CM

## 2019-11-10 DIAGNOSIS — R278 Other lack of coordination: Secondary | ICD-10-CM

## 2019-11-10 DIAGNOSIS — R29898 Other symptoms and signs involving the musculoskeletal system: Secondary | ICD-10-CM

## 2019-11-10 NOTE — Therapy (Signed)
[place] MAIN [facility] SERVICES [address] [place], [place], [address] Phone: [phone]   Fax:  [phone]  Patient Details  Name: Jerry Pena MRN: [id number] Date of Birth: Feb 10, 1951 Referring Provider:  [name], MD  Encounter Date: 11/10/2019  Discharge Summary   Pt has achieved 100% of his STG and LTG  goals. Pt's R sciatica has resolved. Urinary leakage has improved by 98%  and pt is more confident leaving his house for > 2 hours to attend to activities in the community. Pt is IND with HEP, stretches, and strengthening exercises to minimze strain pelvic floor.  Based on the [facility] scale, pt reports his Sx have improved a "Very [place] Better" since [facility].  Pt's musculoskeletal improvements include:  _equal pelvic/ spinal alignment, decreased  spinal mm/ pelvic floor mm tensions _proper deep core coordination and strength _increased fascial abdominal mobility  _IND with fitness and strength exercises   Interdisciplinary update:  Pt has been following up with his GI for the blood he has noticed in his stool for several months. At his last consult, pt was explained it was caused by the radiation he underwent for prostate cancer. Pt was called this morning that tomorrow pt is getting COVID tested and has to fast for procedure to burn his blood vessels in the colon.     Pt is d/c at this time.    PT Long Term Goals - 11/10/19 1113      PT LONG TERM GOAL #1   Title  Pt will decrease IPSS score from 4 pts to < 2 pts, QOL: 1 pt to 0 pts in order to  improve continence  ( 10/5: 3pts , QOL  3 pts, 11/2: 2 pts, QOL 0 pts)    Time  10    Period  Weeks    Status  Achieved      PT LONG TERM GOAL #2   Title  Pt will decrease his ODI score from 11% to < 5% in order to improve back function for ADLs and fitness ( 10/5: 3%    Time  8    Period  Weeks    Status  Achieved      PT LONG TERM GOAL #3   Title  Pt will demo increased L SIJ  mobility, no increased mm tightness / R convex lumbar curve across 2 sessions in order to progress to hip extension strengthening exercises    Time  2    Period  Weeks    Status  Achieved      PT LONG TERM GOAL #4   Title  Pt will demo increased co-activation of deep core mm/lower kinetic chain with gait and  fitness exercises to minimize incontinence    Time  8    Period  Weeks    Status  Achieved      PT LONG TERM GOAL #5   Title  Pt will demo IND w/ modifications to his fitness routine to minimize pelvic floor dysfunctions    Time  6    Period  Weeks    Status  Achieved      PT LONG TERM GOAL #6   Title  Pt will report increasing his time of leaving his house from 45 min to > 2 hours to  participate in public and social activities    Time  10    Period  Weeks    Status  Achieved  Jerl Mina ,PT, DPT, E-RYT  11/10/2019, 12:03 PM  [place] MAIN [facility] SERVICES [address] [place], [place], [address] Phone: [phone]   Fax:  [phone]

## 2019-11-11 ENCOUNTER — Other Ambulatory Visit
Admission: RE | Admit: 2019-11-11 | Discharge: 2019-11-11 | Disposition: A | Discharge status: Home / Self Care | Payer: Medicare HMO | Source: Ambulatory Visit | Attending: Gastroenterology | Admitting: Gastroenterology

## 2019-11-11 DIAGNOSIS — Z01812 Encounter for preprocedural laboratory examination: Secondary | ICD-10-CM | POA: Insufficient documentation

## 2019-11-11 DIAGNOSIS — Z20828 Contact with and (suspected) exposure to other viral communicable diseases: Secondary | ICD-10-CM | POA: Diagnosis not present

## 2019-11-11 LAB — SARS CORONAVIRUS 2 (TAT 6-24 HRS): SARS Coronavirus 2: NEGATIVE

## 2019-11-12 ENCOUNTER — Telehealth: Payer: Self-pay | Admitting: Gastroenterology

## 2019-11-12 NOTE — Telephone Encounter (Signed)
Patient called & l/m stating script was not at [facility] on [address].Please call in prep.

## 2019-11-12 NOTE — Telephone Encounter (Signed)
Patient has been informed that no prescription is needed for his Flex Sig.  Patient has been asked to pick up the instructions from the pharmacy as discussed, then pick up a bottle of mag citrate and fleets enema.  He stated he had not gone to the pharmacy yet.  I asked him to go to the pharmacy and pick up the instructions from the pharmacy, ask for the pharmacist to help him pick out the two items he's going to need for his prep as mentioned in the instructions.  Asked him to call me back tomorrow if he has any questions.  Thanks [employer]

## 2019-11-12 NOTE — Telephone Encounter (Signed)
Returned patients call regarding prep instructions.  He is scheduled to have a flex sigmoidoscopy.  Asked patient to come by the office to pick up his instructions and he states that he would not be able to do it today.  Asked if he was okay with me faxing instructions to the pharmacy to provide to him.  Patient verbally stated that I could send his procedure instructions to the pharmacy and he will pick them up at the pharmacy.  Contacted Jocelyn Lamer at [facility] to make sure they would be okay with doing this.  Jocelyn Lamer stated that she will provide patient with his instructions.  Thanks [employer]

## 2019-11-12 NOTE — Telephone Encounter (Signed)
Pt is calling he has a procedure on Friday and has not received his instructions please call pt

## 2019-11-14 ENCOUNTER — Other Ambulatory Visit: Payer: Self-pay

## 2019-11-14 ENCOUNTER — Ambulatory Visit
Admission: RE | Admit: 2019-11-14 | Discharge: 2019-11-14 | Disposition: A | Discharge status: Home / Self Care | Payer: Medicare HMO | Attending: Gastroenterology | Admitting: Gastroenterology

## 2019-11-14 ENCOUNTER — Ambulatory Visit: Payer: Medicare HMO | Admitting: Registered Nurse

## 2019-11-14 ENCOUNTER — Encounter
Admission: RE | Disposition: A | Discharge status: Home / Self Care | Payer: Self-pay | Source: Home / Self Care | Attending: Gastroenterology

## 2019-11-14 ENCOUNTER — Encounter: Payer: Self-pay | Admitting: *Deleted

## 2019-11-14 DIAGNOSIS — K625 Hemorrhage of anus and rectum: Secondary | ICD-10-CM | POA: Diagnosis not present

## 2019-11-14 DIAGNOSIS — Z8546 Personal history of malignant neoplasm of prostate: Secondary | ICD-10-CM | POA: Diagnosis not present

## 2019-11-14 DIAGNOSIS — F17290 Nicotine dependence, other tobacco product, uncomplicated: Secondary | ICD-10-CM | POA: Diagnosis not present

## 2019-11-14 DIAGNOSIS — K5520 Angiodysplasia of colon without hemorrhage: Secondary | ICD-10-CM | POA: Insufficient documentation

## 2019-11-14 DIAGNOSIS — K627 Radiation proctitis: Secondary | ICD-10-CM | POA: Diagnosis not present

## 2019-11-14 HISTORY — PX: FLEXIBLE SIGMOIDOSCOPY: SHX5431

## 2019-11-14 LAB — VITAMIN B12: Vitamin B-12: 593 pg/mL (ref 180–914)

## 2019-11-14 LAB — IRON AND TIBC
Iron: 78 ug/dL (ref 45–182)
Saturation Ratios: 29 % (ref 17.9–39.5)
TIBC: 272 ug/dL (ref 250–450)
UIBC: 194 ug/dL

## 2019-11-14 LAB — FERRITIN: Ferritin: 151 ng/mL (ref 24–336)

## 2019-11-14 LAB — FOLATE: Folate: 31 ng/mL (ref >5.9)

## 2019-11-14 SURGERY — SIGMOIDOSCOPY, FLEXIBLE
Anesthesia: General

## 2019-11-14 MED ORDER — LIDOCAINE HCL (CARDIAC) PF 100 MG/5ML IV SOSY
PREFILLED_SYRINGE | INTRAVENOUS | Status: DC | PRN
  Administered 2019-11-14: 60 mg via INTRAVENOUS
  Administered 2019-11-14: 20 mg via INTRAVENOUS

## 2019-11-14 MED ORDER — PROPOFOL 500 MG/50ML IV EMUL
INTRAVENOUS | Status: AC
  Filled 2019-11-14: qty 50

## 2019-11-14 MED ORDER — PROPOFOL 10 MG/ML IV BOLUS
INTRAVENOUS | Status: DC | PRN
  Administered 2019-11-14: 30 mg via INTRAVENOUS
  Administered 2019-11-14: 40 mg via INTRAVENOUS
  Administered 2019-11-14: 20 mg via INTRAVENOUS

## 2019-11-14 MED ORDER — LIDOCAINE HCL (PF) 2 % IJ SOLN
INTRAMUSCULAR | Status: AC
  Filled 2019-11-14: qty 10

## 2019-11-14 MED ORDER — PROPOFOL 500 MG/50ML IV EMUL
INTRAVENOUS | Status: DC | PRN
  Administered 2019-11-14: 100 ug/kg/min via INTRAVENOUS

## 2019-11-14 MED ORDER — SODIUM CHLORIDE 0.9 % IV SOLN
INTRAVENOUS | Status: DC
  Administered 2019-11-14: 12:00:00 via INTRAVENOUS

## 2019-11-14 NOTE — Op Note (Signed)
[facility] Gastroenterology Patient Name: Cleon Signorelli Procedure Date: 11/14/2019 12:17 PM MRN: [id number] Account #: [id number] Date of Birth: 05/20/1951 Admit Type: Outpatient Age: 68 Room: [facility] ENDO ROOM 1 Gender: Male Note Status: Finalized Procedure:             Flexible Sigmoidoscopy Indications:           Rectal hemorrhage, For therapy of angiodysplasia (of                         intestine) with hemorrhage Providers:             Lin Landsman MD, MD Medicines:             Monitored Anesthesia Care Complications:         No immediate complications. Estimated blood loss: None. Procedure:             Pre-Anesthesia Assessment:                        - Prior to the procedure, a History and Physical was                         performed, and patient medications and allergies were                         reviewed. The patient is competent. The risks and                         benefits of the procedure and the sedation options and                         risks were discussed with the patient. All questions                         were answered and informed consent was obtained.                         Patient identification and proposed procedure were                         verified by the physician, the nurse, the                         anesthesiologist, the anesthetist and the technician                         in the pre-procedure area in the procedure room in the                         endoscopy suite. Mental Status Examination: alert and                         oriented. Airway Examination: normal oropharyngeal                         airway and neck mobility. Respiratory Examination:                         clear to auscultation. CV Examination: normal.  Prophylactic Antibiotics: The patient does not require                         prophylactic antibiotics. Prior Anticoagulants: The                         patient has taken  no previous anticoagulant or                         antiplatelet agents. ASA Grade Assessment: II - A                         patient with mild systemic disease. After reviewing                         the risks and benefits, the patient was deemed in                         satisfactory condition to undergo the procedure. The                         anesthesia plan was to use monitored anesthesia care                         (MAC). Immediately prior to administration of                         medications, the patient was re-assessed for adequacy                         to receive sedatives. The heart rate, respiratory                         rate, oxygen saturations, blood pressure, adequacy of                         pulmonary ventilation, and response to care were                         monitored throughout the procedure. The physical                         status of the patient was re-assessed after the                         procedure.                        After obtaining informed consent, the scope was passed                         under direct vision. The Endoscope was introduced                         through the anus and advanced to the the sigmoid                         colon. The flexible sigmoidoscopy was accomplished  without difficulty. The patient tolerated the                         procedure well. The quality of the bowel preparation                         was adequate. Findings:      The perianal and digital rectal examinations were normal. Pertinent       negatives include normal sphincter tone and no palpable rectal lesions.      Multiple large diffuse angiodysplastic lesions without bleeding were       found in the mid rectum and in the distal rectum consistent with       radiation proctitis. Coagulation for hemostasis using argon plasma was       successful. Estimated blood loss: none. Impression:            - Multiple non-bleeding  colonic angiodysplastic                         lesions. Treated with argon plasma coagulation (APC).                        - No specimens collected. Recommendation:        - Discharge patient to home (with escort).                        - Resume previous diet today.                        - Continue present medications.                        - Repeat flexible sigmoidoscopy PRN for retreatment.                        - Return to my office PRN.                        - Check iron studies, B12 and folate panel today Procedure Code(s):     --- Professional ---                        [phone], Sigmoidoscopy, flexible; with control of                         bleeding, any method Diagnosis Code(s):     --- Professional ---                        K62.5, Hemorrhage of anus and rectum                        K55.21, Angiodysplasia of colon with hemorrhage CPT copyright 2019 American Medical Association. All rights reserved. The codes documented in this report are preliminary and upon coder review may  be revised to meet current compliance requirements. Dr. Ulyess Mort Lin Landsman MD, MD 11/14/2019 12:43:09 PM This report has been signed electronically. Number of Addenda: 0 Note Initiated On: 11/14/2019 12:17 PM Total Procedure Duration: 0 hours 7 minutes 10 seconds  Estimated Blood Loss:  Estimated blood loss: none.  [facility]

## 2019-11-14 NOTE — Anesthesia Post-op Follow-up Note (Signed)
Anesthesia QCDR form completed.        

## 2019-11-14 NOTE — H&P (Signed)
Jerry Darby, MD [address]  [place]  [place], [place] [address]  Main: [phone]  Fax: [phone] Pager: [phone]  Primary Care Physician:  Hubbard Hartshorn, FNP Primary Gastroenterologist:  Dr. Cephas Pena  Pre-Procedure History & Physical: HPI:  Jerry Pena is a 68 y.o. male is here for an flexible sigmoidoscopy.   Past Medical History:  Diagnosis Date   Beta thalassemia trait 09/27/2018   Cancer ([place]) 10/2017   Prostate   DDD (degenerative disc disease), lumbar 06/13/2017   Lumbar imaging June 2018   Elevated PSA 09/12/2017   Refer to urologist, Sept 2018   Facet hypertrophy of lumbar region 06/13/2017   Lumbar imaging June 2018   Hyperlipidemia     Past Surgical History:  Procedure Laterality Date   CATARACT EXTRACTION W/ INTRAOCULAR LENS IMPLANT Left 3/ 28/2014   eyes   CATARACT EXTRACTION W/ INTRAOCULAR LENS IMPLANT Right 04/23/2013   COLONOSCOPY WITH PROPOFOL N/A 07/29/2019   Procedure: COLONOSCOPY WITH PROPOFOL;  Surgeon: Lin Landsman, MD;  Location: [facility];  Service: Gastroenterology;  Laterality: N/A;   PELVIC LYMPH NODE DISSECTION N/A 12/10/2017   Procedure: PELVIC LYMPH NODE DISSECTION;  Surgeon: [name], MD;  Location: [facility];  Service: Urology;  Laterality: N/A;   PROSTATE SURGERY     ROBOT ASSISTED LAPAROSCOPIC RADICAL PROSTATECTOMY N/A 12/10/2017   Procedure: ROBOTIC ASSISTED LAPAROSCOPIC RADICAL PROSTATECTOMY;  Surgeon: [name], MD;  Location: [facility];  Service: Urology;  Laterality: N/A;    Prior to Admission medications   Medication Sig Start Date End Date Taking? Authorizing Provider  acetaminophen (TYLENOL) 500 MG tablet Take 1,000 mg by mouth every 6 (six) hours as needed for moderate pain or headache.    Yes [provider]  calcium-vitamin D (OSCAL WITH D) 500-200 MG-UNIT tablet Take 1 tablet by mouth.   Yes [provider]  folic acid (FOLVITE) A999333 MCG tablet Take 1  tablet (400 mcg total) by mouth daily. 10/04/18  Yes Earlie Server, MD  ibuprofen (ADVIL,MOTRIN) 200 MG tablet Take 400 mg by mouth every 6 (six) hours as needed for headache or moderate pain.   Yes [provider]  Melatonin 5 MG TABS Take 5 mg by mouth daily as needed.   Yes [provider]  Multiple Vitamin (MULTIVITAMIN) tablet Take 1 tablet by mouth daily.   Yes [provider]  polyethylene glycol (MIRALAX / GLYCOLAX) 17 g packet Take 17 g by mouth daily.   Yes [provider]  vitamin B-12 (CYANOCOBALAMIN) 100 MCG tablet Take 100 mcg by mouth daily.   Yes [provider]  vitamin C (ASCORBIC ACID) 500 MG tablet Take 500 mg by mouth daily.   Yes [provider]  psyllium (METAMUCIL) 58.6 % packet Take 1 packet by mouth daily.    [provider]    Allergies as of 11/10/2019 - Review Complete 10/14/2019  Allergen Reaction Noted   Aspirin Other (See Comments) 11/20/2017   Penicillins Hives and Other (See Comments) 09/09/2015    Family History  Problem Relation Age of Onset   Stroke Mother    Stroke Father    Cancer Sister        breast, lung   Pulmonary Hypertension Sister    Cancer Son        prostate   Prostate cancer Son    Hypertension Sister    Bladder Cancer Neg Hx    Kidney cancer Neg Hx     Social  History   Socioeconomic History   Marital status: Divorced    Spouse name: Not on file   Number of children: 4   Years of education: Not on file   Highest education level: Associate degree: academic program  Occupational History   Occupation: Retired  Scientist, product/process development strain: Not hard at [employer]    Worry: Never true    Inability: Never true   Transportation needs    Medical: No    Non-medical: No  Tobacco Use   Smoking status: Current Some Day Smoker    Years: 48.00    Types: Cigars   Smokeless tobacco: Never Used   Tobacco comment: smokes 3 small  cigars per day  Substance and Sexual Activity   Alcohol use: Yes    Alcohol/week: 0.0 standard drinks    Comment: beer rarely   Drug use: No    Comment: used marijuana more than 10 years ago   Sexual activity: Not Currently  Lifestyle   Physical activity    Days per week: 7 days    Minutes per session: 90 min   Stress: Only a little  Relationships   Social connections    Talks on phone: More than three times a week    Gets together: Twice a week    Attends religious service: More than 4 times per year    Active member of club or organization: No    Attends meetings of clubs or organizations: Never    Relationship status: Divorced   Intimate partner violence    Fear of current or ex partner: No    Emotionally abused: No    Physically abused: No    Forced sexual activity: No  Other Topics Concern   Not on file  Social History Narrative   Patient just buried his baby sister, Diane,10/2018    Review of Systems: See HPI, otherwise negative ROS  Physical Exam: BP 110/65    Pulse 63    Temp (!) 96.7 F (35.9 C) (Temporal)    Resp 18    Ht 5\' 8"  (1.727 m)    Wt 64.4 kg    SpO2 98%    BMI 21.59 kg/m  General:   Alert,  pleasant and cooperative in NAD Head:  Normocephalic and atraumatic. Neck:  Supple; no masses or thyromegaly. Lungs:  Clear throughout to auscultation.    Heart:  Regular rate and rhythm. Abdomen:  Soft, nontender and nondistended. Normal bowel sounds, without guarding, and without rebound.   Neurologic:  Alert and  oriented x4;  grossly normal neurologically.  Impression/Plan: Sahid Milici is here for an flexible sigmoidoscopy to be performed for rectal bleeding, h/o radiation proctitis  Risks, benefits, limitations, and alternatives regarding  flexible sigmoidoscopy have been reviewed with the patient.  Questions have been answered.  All parties agreeable.   Sherri Sear, MD  11/14/2019, 12:21 PM

## 2019-11-14 NOTE — Transfer of Care (Signed)
Immediate Anesthesia Transfer of Care Note  Patient: Jerry Pena  Procedure(s) Performed: FLEXIBLE SIGMOIDOSCOPY (N/A )  Patient Location: Endoscopy Unit  Anesthesia Type:General  Level of Consciousness: sedated  Airway & Oxygen Therapy: Patient Spontanous Breathing and Patient connected to nasal cannula oxygen  Post-op Assessment: Report given to RN and Post -op Vital signs reviewed and stable  Post vital signs: Reviewed and stable  Last Vitals:  Vitals Value Taken Time  BP 90/67 11/14/19 1245  Temp    Pulse 74 11/14/19 1245  Resp 19 11/14/19 1245  SpO2 99 % 11/14/19 1245  Vitals shown include unvalidated device data.  Last Pain:  Vitals:   11/14/19 1152  TempSrc: Temporal  PainSc: 0-No pain         Complications: No apparent anesthesia complications

## 2019-11-14 NOTE — Anesthesia Preprocedure Evaluation (Signed)
Anesthesia Evaluation  Patient identified by MRN, date of birth, ID band Patient awake    Reviewed: Allergy & Precautions, H&P , NPO status , Patient's Chart, lab work & pertinent test results, reviewed documented beta blocker date and time   Airway Mallampati: II   Neck ROM: full    Dental  (+) Poor Dentition   Pulmonary neg pulmonary ROS, Current Smoker and Patient abstained from smoking.,    Pulmonary exam normal        Cardiovascular Exercise Tolerance: Good negative cardio ROS Normal cardiovascular exam Rhythm:regular Rate:Normal     Neuro/Psych  Headaches,  Neuromuscular disease negative neurological ROS  negative psych ROS   GI/Hepatic negative GI ROS, Neg liver ROS,   Endo/Other  negative endocrine ROS  Renal/GU negative Renal ROS  negative genitourinary   Musculoskeletal   Abdominal   Peds  Hematology negative hematology ROS (+) Blood dyscrasia, anemia ,   Anesthesia Other Findings Past Medical History: 09/27/2018: Beta thalassemia trait 10/2017: Cancer ([place])     Comment:  Prostate 06/13/2017: DDD (degenerative disc disease), lumbar     Comment:  Lumbar imaging June 2018 09/12/2017: Elevated PSA     Comment:  Refer to urologist, Sept 2018 06/13/2017: Facet hypertrophy of lumbar region     Comment:  Lumbar imaging June 2018 No date: Hyperlipidemia Past Surgical History: 3/ 28/2014: CATARACT EXTRACTION W/ INTRAOCULAR LENS IMPLANT; Left     Comment:  eyes 04/23/2013: CATARACT EXTRACTION W/ INTRAOCULAR LENS IMPLANT; Right 07/29/2019: COLONOSCOPY WITH PROPOFOL; N/A     Comment:  Procedure: COLONOSCOPY WITH PROPOFOL;  Surgeon: Lin Landsman, MD;  Location: [facility];  Service:               Gastroenterology;  Laterality: N/A; 12/10/2017: PELVIC LYMPH NODE DISSECTION; N/A     Comment:  Procedure: PELVIC LYMPH NODE DISSECTION;  Surgeon:               [name], MD;  Location:  [facility];  Service:               Urology;  Laterality: N/A; No date: PROSTATE SURGERY 12/10/2017: ROBOT ASSISTED LAPAROSCOPIC RADICAL PROSTATECTOMY; N/A     Comment:  Procedure: ROBOTIC ASSISTED LAPAROSCOPIC RADICAL               PROSTATECTOMY;  Surgeon: [name], MD;  Location:               [facility];  Service: Urology;  Laterality: N/A;   Reproductive/Obstetrics negative OB ROS                             Anesthesia Physical Anesthesia Plan  ASA: II  Anesthesia Plan: General   Post-op Pain Management:    Induction:   PONV Risk Score and Plan:   Airway Management Planned:   Additional Equipment:   Intra-op Plan:   Post-operative Plan:   Informed Consent: I have reviewed the patients History and Physical, chart, labs and discussed the procedure including the risks, benefits and alternatives for the proposed anesthesia with the patient or authorized representative who has indicated his/her understanding and acceptance.     Dental Advisory Given  Plan Discussed with: CRNA  Anesthesia Plan Comments:         Anesthesia Quick Evaluation

## 2019-11-17 ENCOUNTER — Encounter: Payer: Self-pay | Admitting: Gastroenterology

## 2019-11-19 NOTE — Anesthesia Postprocedure Evaluation (Signed)
Anesthesia Post Note  Patient: Jerry Pena  Procedure(s) Performed: FLEXIBLE SIGMOIDOSCOPY (N/A )  Patient location during evaluation: PACU Anesthesia Type: General Level of consciousness: awake and alert Pain management: pain level controlled Vital Signs Assessment: post-procedure vital signs reviewed and stable Respiratory status: spontaneous breathing, nonlabored ventilation, respiratory function stable and patient connected to nasal cannula oxygen Cardiovascular status: blood pressure returned to baseline and stable Postop Assessment: no apparent nausea or vomiting Anesthetic complications: no     Last Vitals:  Vitals:   11/14/19 1255 11/14/19 1305  BP: (P) 107/68 114/80  Pulse: 69 73  Resp: (!) 21 20  Temp:    SpO2: 96% 100%    Last Pain:  Vitals:   11/15/19 0910  TempSrc:   PainSc: [place] Jerry Pena

## 2019-11-24 ENCOUNTER — Encounter: Payer: Medicare HMO | Admitting: Physical Therapy

## 2019-12-15 ENCOUNTER — Ambulatory Visit (INDEPENDENT_AMBULATORY_CARE_PROVIDER_SITE_OTHER): Payer: Medicare HMO | Admitting: Family Medicine

## 2019-12-15 ENCOUNTER — Encounter: Payer: Self-pay | Admitting: Family Medicine

## 2019-12-15 ENCOUNTER — Other Ambulatory Visit: Payer: Self-pay

## 2019-12-15 VITALS — BP 110/70 | HR 81 | Temp 97.9°F | Resp 16 | Ht 68.0 in | Wt 141.9 lb

## 2019-12-15 DIAGNOSIS — C61 Malignant neoplasm of prostate: Secondary | ICD-10-CM

## 2019-12-15 DIAGNOSIS — M5136 Other intervertebral disc degeneration, lumbar region: Secondary | ICD-10-CM

## 2019-12-15 DIAGNOSIS — E782 Mixed hyperlipidemia: Secondary | ICD-10-CM

## 2019-12-15 DIAGNOSIS — Z8719 Personal history of other diseases of the digestive system: Secondary | ICD-10-CM | POA: Diagnosis not present

## 2019-12-15 DIAGNOSIS — M5431 Sciatica, right side: Secondary | ICD-10-CM

## 2019-12-15 DIAGNOSIS — Z720 Tobacco use: Secondary | ICD-10-CM

## 2019-12-15 DIAGNOSIS — D563 Thalassemia minor: Secondary | ICD-10-CM

## 2019-12-15 DIAGNOSIS — D649 Anemia, unspecified: Secondary | ICD-10-CM

## 2019-12-15 DIAGNOSIS — D709 Neutropenia, unspecified: Secondary | ICD-10-CM

## 2019-12-15 NOTE — Progress Notes (Signed)
Name: Jerry Pena   MRN: [id number]    DOB: 10/25/1951   Date:12/15/2019       Progress Note  Subjective  Chief Complaint  Chief Complaint  Patient presents with  . Follow-up    discuss prostate cancer   . Hyperlipidemia    HPI  Rectal Bleeding: He had colonoscopy with GI (Dr. Marius Ditch) - had 1 episode of small BRBPR 3 days after procedure, none since then.  He was having pain with BM's for about 10 days after procedure, but this has also subsided.  Still taking miralax, no issues with constipation now - BM's are normal.  No abdominal pain.   Hyperlipidemia Had mildly elevated triglycerides in 2019 - recheck in June 2020 showed normal levels.  Occasionally eats fried foods- mostly uses the oven. Gets at least 4 servings of vegetables a day.    Prostate cancer Follows up with Dr. Erlene Quan (urology) & Dr. Baruch Gouty (radiation oncology). He had a radical robotic prostatectomy on 11/2017 had trialed some radiation afterwards but stopped due to side effects. His last PSA was undetectable 8/01/01/2019.  He does still deal with incontinence - went to pelvic floor PT and this did help improve. Otherwise no concerns.   Degenerartive disc disease Takes calcium and vitamin D supplements daily, takes aceteaminophen as needed for pain.  He has improved since going to PT and learning certain exercises, has occasional sciatic nerve pain.  Neutropenia and Beta Thalasemia Follows up with Dr. Tasia Catchings for microcytic anemia and neutropenia.  Last CBC was 10/08/2019. Noted to have beta thalaseemia trait and suggested to avoid iron supplementation and taking folic acid 400mg  daily.  He has been compliant with this regimen - no concerns.   Tobacco Use: Smoking a cigar once a week; no cigarette use.  Patient Active Problem List   Diagnosis Date Noted  . Chronic constipation 10/14/2019  . Rectal bleeding 10/14/2019  . Radiation proctitis   . Migraine 01/23/2019  . Cataract 01/23/2019  . Beta thalassemia  trait 09/27/2018  . Neutropenia ([place]) 09/23/2018  . History of hormone therapy 06/07/2018  . Prostate cancer ([place]) 12/10/2017  . Facet hypertrophy of lumbar region 06/13/2017  . DDD (degenerative disc disease), lumbar 06/13/2017  . Hyperlipidemia 09/09/2015  . Sciatica 09/09/2015  . Arthritis, lumbar spine 09/09/2015  . Tobacco abuse 09/09/2015  . Personal history of colonic polyps 09/09/2015    Past Surgical History:  Procedure Laterality Date  . CATARACT EXTRACTION W/ INTRAOCULAR LENS IMPLANT Left 3/ 28/2014   eyes  . CATARACT EXTRACTION W/ INTRAOCULAR LENS IMPLANT Right 04/23/2013  . COLONOSCOPY WITH PROPOFOL N/A 07/29/2019   Procedure: COLONOSCOPY WITH PROPOFOL;  Surgeon: Lin Landsman, MD;  Location: [facility] ENDOSCOPY;  Service: Gastroenterology;  Laterality: N/A;  . FLEXIBLE SIGMOIDOSCOPY N/A 11/14/2019   Procedure: FLEXIBLE SIGMOIDOSCOPY;  Surgeon: Lin Landsman, MD;  Location: [facility] ENDOSCOPY;  Service: Gastroenterology;  Laterality: N/A;  . PELVIC LYMPH NODE DISSECTION N/A 12/10/2017   Procedure: PELVIC LYMPH NODE DISSECTION;  Surgeon: [name], MD;  Location: [facility] ORS;  Service: Urology;  Laterality: N/A;  . PROSTATE SURGERY    . ROBOT ASSISTED LAPAROSCOPIC RADICAL PROSTATECTOMY N/A 12/10/2017   Procedure: ROBOTIC ASSISTED LAPAROSCOPIC RADICAL PROSTATECTOMY;  Surgeon: [name], MD;  Location: [facility] ORS;  Service: Urology;  Laterality: N/A;    Family History  Problem Relation Age of Onset  . Stroke Mother   . Stroke Father   . Cancer Sister        breast, lung  .  Pulmonary Hypertension Sister   . Cancer Son        prostate  . Prostate cancer Son   . Hypertension Sister   . Bladder Cancer Neg Hx   . Kidney cancer Neg Hx     Social History   Socioeconomic History  . Marital status: Divorced    Spouse name: Not on file  . Number of children: 4  . Years of education: Not on file  . Highest education level: Associate degree: academic program    Occupational History  . Occupation: Retired  Tobacco Use  . Smoking status: Current Some Day Smoker    Years: 48.00    Types: Cigars  . Smokeless tobacco: Never Used  . Tobacco comment: smokes 3 small cigars per day  Substance and Sexual Activity  . Alcohol use: Yes    Alcohol/week: 0.0 standard drinks    Comment: beer rarely  . Drug use: No    Comment: used marijuana more than 10 years ago  . Sexual activity: Not Currently  Other Topics Concern  . Not on file  Social History Narrative   Patient just buried his baby sister, Diane,10/2018   Social Determinants of Health   Financial Resource Strain:   . Difficulty of Paying Living Expenses: Not on file  Food Insecurity:   . Worried About Charity fundraiser in the Last Year: Not on file  . Ran Out of Food in the Last Year: Not on file  Transportation Needs:   . Lack of Transportation (Medical): Not on file  . Lack of Transportation (Non-Medical): Not on file  Physical Activity: Unknown  . Days of Exercise per Week: Not on file  . Minutes of Exercise per Session: 90 min  Stress: No Stress Concern Present  . Feeling of Stress : Only a little  Social Connections: Unknown  . Frequency of Communication with Friends and Family: More than three times a week  . Frequency of Social Gatherings with Friends and Family: Twice a week  . Attends Religious Services: More than 4 times per year  . Active Member of Clubs or Organizations: No  . Attends Archivist Meetings: Never  . Marital Status: Not on file  Intimate Partner Violence:   . Fear of Current or Ex-Partner: Not on file  . Emotionally Abused: Not on file  . Physically Abused: Not on file  . Sexually Abused: Not on file     Current Outpatient Medications:  .  acetaminophen (TYLENOL) 500 MG tablet, Take 1,000 mg by mouth every 6 (six) hours as needed for moderate pain or headache. , Disp: , Rfl:  .  calcium-vitamin D (OSCAL WITH D) 500-200 MG-UNIT tablet, Take 1  tablet by mouth., Disp: , Rfl:  .  folic acid (FOLVITE) A999333 MCG tablet, Take 1 tablet (400 mcg total) by mouth daily., Disp: 90 tablet, Rfl: 4 .  ibuprofen (ADVIL,MOTRIN) 200 MG tablet, Take 400 mg by mouth every 6 (six) hours as needed for headache or moderate pain., Disp: , Rfl:  .  Melatonin 5 MG TABS, Take 5 mg by mouth daily as needed., Disp: , Rfl:  .  Multiple Vitamin (MULTIVITAMIN) tablet, Take 1 tablet by mouth daily., Disp: , Rfl:  .  polyethylene glycol (MIRALAX / GLYCOLAX) 17 g packet, Take 17 g by mouth daily., Disp: , Rfl:  .  psyllium (METAMUCIL) 58.6 % packet, Take 1 packet by mouth daily., Disp: , Rfl:  .  vitamin B-12 (CYANOCOBALAMIN) 100 MCG tablet,  Take 100 mcg by mouth daily., Disp: , Rfl:  .  vitamin C (ASCORBIC ACID) 500 MG tablet, Take 500 mg by mouth daily., Disp: , Rfl:   Allergies  Allergen Reactions  . Aspirin Other (See Comments)    Upset stomach, indigestion and heart burn  . Penicillins Hives and Other (See Comments)    Has patient had a PCN reaction causing immediate rash, facial/tongue/throat swelling, SOB or lightheadedness with hypotension: Yes Has patient had a PCN reaction causing severe rash involving mucus membranes or skin necrosis: No Has patient had a PCN reaction that required hospitalization: Yes Has patient had a PCN reaction occurring within the last 10 years: No If all of the above answers are "NO", then may proceed with Cephalosporin use.     I personally reviewed active problem list, medication list, allergies, health maintenance, notes from last encounter, lab results with the patient/caregiver today.   ROS  Ten systems reviewed and is negative except as mentioned in HPI  Objective  Vitals:   12/15/19 0737  BP: 110/70  Pulse: 81  Resp: 16  Temp: 97.9 F (36.6 C)  TempSrc: Temporal  SpO2: 96%  Weight: 141 lb 14.4 oz (64.4 kg)  Height: 5\' 8"  (1.727 m)    Body mass index is 21.58 kg/m.  Physical Exam  Constitutional:  Patient appears well-developed and well-nourished. No distress.  HENT: Head: Normocephalic and atraumatic.  Eyes: Conjunctivae and EOM are normal. No scleral icterus.  Neck: Normal range of motion. Neck supple. No JVD present.  Cardiovascular: Normal rate, regular rhythm and normal heart sounds.  No murmur heard. No BLE edema.  Pulmonary/Chest: Effort normal and breath sounds normal. No respiratory distress. Musculoskeletal: Normal range of motion, no joint effusions. No gross deformities Neurological: Pt is alert and oriented to person, place, and time. No cranial nerve deficit. Coordination, balance, strength, speech and gait are normal.  Skin: Skin is warm and dry. No rash noted. No erythema.  Psychiatric: Patient has a normal mood and affect. behavior is normal. Judgment and thought content normal.  No results found for this or any previous visit (from the past 72 hour(s)).  PHQ2/9: Depression screen [facility] 2/9 12/15/2019 06/13/2019 12/16/2018 06/14/2018 06/07/2018  Decreased Interest 1 0 0 0 0  Down, Depressed, Hopeless 1 0 0 1 0  PHQ - 2 Score 2 0 0 1 0  Altered sleeping 0 0 0 1 0  Tired, decreased energy 0 1 0 1 0  Change in appetite 0 0 0 0 0  Feeling bad or failure about yourself  0 0 0 0 0  Trouble concentrating 0 0 0 0 0  Moving slowly or fidgety/restless 0 0 0 0 0  Suicidal thoughts 0 0 0 0 0  PHQ-9 Score 2 1 0 3 0  Difficult doing work/chores Not difficult at all Not difficult at all Not difficult at all Somewhat difficult Not difficult at all   PHQ-2/9 Result is negative.    Fall Risk: Fall Risk  12/15/2019 06/13/2019 12/16/2018 06/14/2018 06/07/2018  Falls in the past year? 0 0 0 No No  Number falls in past yr: 0 0 0 - -  Injury with Fall? 0 0 0 - -  Risk for fall due to : - - - - Other (Comment)  Risk for fall due to: Comment - - - - sciatica  Follow up Falls evaluation completed Falls prevention discussed - - -   Assessment & Plan  1. Prostate cancer ([place]) - Keep  follow up with Dr. Erlene Quan and Dr. Baruch Gouty.    2. Neutropenia, unspecified type ([place]) 3. Beta thalassemia trait - Continue follow up with Dr. Tasia Catchings.  4. Mixed hyperlipidemia - Recheck in 6 months  5. DDD (degenerative disc disease), lumbar - Doing much better with home exercises.  6. Anemia, unspecified type - Continue follow up with Dr. Tasia Catchings.  7. Sciatica of right side - Doing much better with home exercises  8. History of rectal bleeding - Follow up with Dr. Marius Ditch PRN - doing well now on miralax  9. Tobacco abuse - Not ready to quit; cessation discussed

## 2019-12-30 ENCOUNTER — Telehealth: Payer: Self-pay | Admitting: Urology

## 2019-12-30 MED ORDER — NONFORMULARY OR COMPOUNDED ITEM: 6 refills | Status: DC

## 2019-12-30 NOTE — Telephone Encounter (Signed)
Pt calling to request refill on medication to be called in at [facility], pt wants this shipped, he does not want to go to [facility] to pick it up. Pt can't give me the name of the medication, says it's the $80 Rx. Injection for erection. Please advise pt at [phone].

## 2019-12-30 NOTE — Telephone Encounter (Addendum)
Refill sent to [facility], patient aware to contact the pharmacy if he wants it shipped to him for a small fee. Voiced understanding.

## 2020-01-21 ENCOUNTER — Other Ambulatory Visit: Payer: Self-pay

## 2020-01-22 ENCOUNTER — Other Ambulatory Visit: Payer: Self-pay

## 2020-01-22 ENCOUNTER — Inpatient Hospital Stay: Payer: Medicare HMO | Attending: Oncology

## 2020-01-22 DIAGNOSIS — C61 Malignant neoplasm of prostate: Secondary | ICD-10-CM | POA: Insufficient documentation

## 2020-01-22 LAB — PSA: Prostatic Specific Antigen: <0.01 ng/mL (ref 0.00–4.00)

## 2020-01-29 ENCOUNTER — Encounter: Payer: Self-pay | Admitting: Radiation Oncology

## 2020-01-29 ENCOUNTER — Other Ambulatory Visit: Payer: Self-pay

## 2020-01-29 ENCOUNTER — Ambulatory Visit
Admission: RE | Admit: 2020-01-29 | Discharge: 2020-01-29 | Disposition: A | Discharge status: Home / Self Care | Payer: Medicare HMO | Source: Ambulatory Visit | Attending: Radiation Oncology | Admitting: Radiation Oncology

## 2020-01-29 VITALS — BP 114/81 | HR 92 | Temp 98.0°F | Resp 18 | Wt 138.9 lb

## 2020-01-29 DIAGNOSIS — C61 Malignant neoplasm of prostate: Secondary | ICD-10-CM | POA: Insufficient documentation

## 2020-01-29 DIAGNOSIS — Z923 Personal history of irradiation: Secondary | ICD-10-CM | POA: Insufficient documentation

## 2020-01-29 NOTE — Progress Notes (Signed)
Radiation Oncology Follow up Note  Name: Jerry Pena   Date:   01/29/2020 MRN:  [id number] DOB: [birth year]    This 69 y.o. male presents to the clinic today for 1-1/2-year follow-up status post salvage radiation therapy to his prostate status post robotic prostatectomy for Gleason 7 (4+3) adenocarcinoma the prostate.  REFERRING PROVIDER: Arnetha Courser, MD  HPI: Patient is a 69 year old male now about a year and a half having completed salvage radiation therapy to his prostate after biochemical failure after his robotic assisted prostatectomy for Gleason 7 adenocarcinoma the prostate.  Seen today in routine follow-up he is doing well.  He specifically denies diarrhea dysuria or any other GI/GU complaints.  He has had some clotting and rectal bleeding.  He had cauterization of some rectal vessels.  He tends to see the bleeding when he is not taking MiraLAX and I suggested he start that on a daily basis.  His PSA is less than 0.01.Marland Kitchen  COMPLICATIONS OF TREATMENT: none  FOLLOW UP COMPLIANCE: keeps appointments   PHYSICAL EXAM:  BP 114/81   Pulse 92   Temp 98 F (36.7 C)   Resp 18   Wt 138 lb 14.4 oz (63 kg)   BMI 21.12 kg/m  Well-developed well-nourished patient in NAD. HEENT reveals PERLA, EOMI, discs not visualized.  Oral cavity is clear. No oral mucosal lesions are identified. Neck is clear without evidence of cervical or supraclavicular adenopathy. Lungs are clear to A&P. Cardiac examination is essentially unremarkable with regular rate and rhythm without murmur rub or thrill. Abdomen is benign with no organomegaly or masses noted. Motor sensory and DTR levels are equal and symmetric in the upper and lower extremities. Cranial nerves II through XII are grossly intact. Proprioception is intact. No peripheral adenopathy or edema is identified. No motor or sensory levels are noted. Crude visual fields are within normal range.  RADIOLOGY RESULTS: No current films to review  PLAN: Present  time patient is doing well he will continue taking MiraLAX on daily basis.  He will revisit with GI should his bleeding persist or worsen.  I will see him back in 1 year with a PSA at that time.  Patient knows to call with any concerns.  I would like to take this opportunity to thank you for allowing me to participate in the care of your patient.Noreene Filbert, MD

## 2020-02-19 ENCOUNTER — Encounter: Payer: Medicare HMO | Admitting: Licensed Clinical Social Worker

## 2020-02-19 ENCOUNTER — Inpatient Hospital Stay: Payer: Medicare HMO | Admitting: Licensed Clinical Social Worker

## 2020-02-23 ENCOUNTER — Other Ambulatory Visit: Payer: Self-pay | Admitting: *Deleted

## 2020-02-23 DIAGNOSIS — C61 Malignant neoplasm of prostate: Secondary | ICD-10-CM

## 2020-02-24 ENCOUNTER — Other Ambulatory Visit: Payer: Medicare HMO

## 2020-03-02 ENCOUNTER — Ambulatory Visit: Payer: Medicare HMO | Admitting: Urology

## 2020-06-14 ENCOUNTER — Ambulatory Visit: Payer: Medicare HMO | Admitting: Family Medicine

## 2020-06-17 ENCOUNTER — Ambulatory Visit: Payer: Medicare HMO

## 2020-06-20 NOTE — Progress Notes (Signed)
Patient ID: Jerry Pena, male    DOB: 07-27-1951, 69 y.o.   MRN: [id number]  PCP: Towanda Malkin, MD  Chief Complaint  Patient presents with  . Follow-up  . Hyperlipidemia    Subjective:   Jerry Pena is a 69 y.o. male, presents to clinic with CC of the following:  Chief Complaint  Patient presents with  . Follow-up  . Hyperlipidemia    HPI:  Patient is a 69 y.o. male patient of Raelyn Ensign. Last visit with her was Dec 2020. Follows up today: All in all, he notes that he has been feeling good. His main complaint is that he is still leaking urine, pretty routinely, and was supposed to follow-up with urology on March 2021, although had to cancel due to his back issues at that time.  Has not yet scheduled a follow-up.  This has been prevalent after the radiation for his prostate cancer. He also notes she has had some dental issues with a couple teeth chipped and falling out, and he also thinks this is due to the radiation.  He has yet to schedule with a dentist, although plans to do so in the very near future.  Hyperlipidemia Med regimen - none Lab Results  Component Value Date   CHOL 157 06/13/2019   HDL 61 06/13/2019   LDLCALC 77 06/13/2019   TRIG 104 06/13/2019   CHOLHDL 2.6 06/13/2019    Had mildly elevated triglycerides in 2019 - recheck in June 2020 showed normal levels.  Tries to eat healthy - Occasionally eats fried foods, Eats vegetables.    Prostate cancer He completed salvage radiation therapy to his prostate after biochemical failure after his robotic assisted prostatectomy on 11/2017 for Gleason 7 adenocarcinoma the prostate. Follows up with Dr. Erlene Quan (urology) & Dr.Chrystal (radiation oncology).  Last visit with rad oncology 01/30/20 with the following plan noted: PLAN: Present time patient is doing well he will continue taking MiraLAX on daily basis and will revisit with GI should his bleeding persist or worsen.   I will see him back in 1  year with a PSA at that time.  Patient knows to call with any concerns.  Last visit with oncology in Jan 2021.  Last PSA Jan 2021 and was < 0.1 Lab Results  Component Value Date   PSA1 <0.1 08/12/2019   PSA1 <0.1 02/06/2019   PSA1 <0.1 11/13/2018   PSA 4.4 (H) 09/03/2017   He does still deal with incontinence - went to pelvic floor PT, still problematic   Degenerartive disc disease Takes calcium and vitamin D supplements daily, takes acetaminophen or ibuprofen as needed for pain, and that does help keep this fairly well controlled.  The sciatica is down his right leg, denies any foot drop or leg weakness concerns when occurs.  He improved after going to PT and learning certain exercises, still has intermittent sciatic nerve pain.  Neutropenia and Beta Thallasemia/Anemia Follows up with Dr. Tasia Catchings for microcytic anemia and neutropenia, next visit in Oct 2021 Last CBC was 10/08/2019. Noted to have beta thallasemia trait and suggested to avoid iron supplementation and taking folic acid 400mg  daily.   He has been compliant with this regimen - no concerns.  Lab Results  Component Value Date   WBC 4.2 10/08/2019   HGB 12.0 (L) 10/08/2019   HCT 38.3 (L) 10/08/2019   MCV 72.0 (L) 10/08/2019   PLT 282 10/08/2019    Rectal Bleeding hx: He had scope with GI (Dr.  Vanga) 10/2019. Notes still has some intermittent BRBPR with BM's, when not take the miralax is more problematic.  Had cauterization procedure prior and painful after with BM, not want to go thru that again. Still taking miralax, no issues with constipation now - BM's are normal.  No abdominal pain, no black or dark stools  Tobacco Use: Smoking a cigar < once a week; no cigarette use.   Colon CA screen - Last colonoscopy - 07/29/2019 and results reviewed  Patient Active Problem List   Diagnosis Date Noted  . Chronic constipation 10/14/2019  . Rectal bleeding 10/14/2019  . Radiation proctitis   . Migraine 01/23/2019  . Cataract  01/23/2019  . Beta thalassemia trait 09/27/2018  . Neutropenia ([place]) 09/23/2018  . History of hormone therapy 06/07/2018  . Prostate cancer ([place]) 12/10/2017  . Facet hypertrophy of lumbar region 06/13/2017  . DDD (degenerative disc disease), lumbar 06/13/2017  . Hyperlipidemia 09/09/2015  . Sciatica 09/09/2015  . Arthritis, lumbar spine 09/09/2015  . Tobacco abuse 09/09/2015  . Personal history of colonic polyps 09/09/2015      Current Outpatient Medications:  .  acetaminophen (TYLENOL) 500 MG tablet, Take 1,000 mg by mouth every 6 (six) hours as needed for moderate pain or headache. , Disp: , Rfl:  .  calcium-vitamin D (OSCAL WITH D) 500-200 MG-UNIT tablet, Take 1 tablet by mouth., Disp: , Rfl:  .  folic acid (FOLVITE) 093 MCG tablet, Take 1 tablet (400 mcg total) by mouth daily., Disp: 90 tablet, Rfl: 4 .  ibuprofen (ADVIL,MOTRIN) 200 MG tablet, Take 400 mg by mouth every 6 (six) hours as needed for headache or moderate pain., Disp: , Rfl:  .  Melatonin 5 MG TABS, Take 5 mg by mouth daily as needed., Disp: , Rfl:  .  Multiple Vitamin (MULTIVITAMIN) tablet, Take 1 tablet by mouth daily., Disp: , Rfl:  .  polyethylene glycol (MIRALAX / GLYCOLAX) 17 g packet, Take 17 g by mouth daily., Disp: , Rfl:  .  vitamin B-12 (CYANOCOBALAMIN) 100 MCG tablet, Take 100 mcg by mouth daily., Disp: , Rfl:  .  vitamin C (ASCORBIC ACID) 500 MG tablet, Take 500 mg by mouth daily., Disp: , Rfl:    Allergies  Allergen Reactions  . Aspirin Other (See Comments)    Upset stomach, indigestion and heart burn  . Penicillins Hives and Other (See Comments)    Has patient had a PCN reaction causing immediate rash, facial/tongue/throat swelling, SOB or lightheadedness with hypotension: Yes Has patient had a PCN reaction causing severe rash involving mucus membranes or skin necrosis: No Has patient had a PCN reaction that required hospitalization: Yes Has patient had a PCN reaction occurring within the last 10  years: No If all of the above answers are "NO", then may proceed with Cephalosporin use.      Past Surgical History:  Procedure Laterality Date  . CATARACT EXTRACTION W/ INTRAOCULAR LENS IMPLANT Left 3/ 28/2014   eyes  . CATARACT EXTRACTION W/ INTRAOCULAR LENS IMPLANT Right 04/23/2013  . COLONOSCOPY WITH PROPOFOL N/A 07/29/2019   Procedure: COLONOSCOPY WITH PROPOFOL;  Surgeon: Lin Landsman, MD;  Location[facility] ENDOSCOPY;  Service: Gastroenterology;  Laterality: N/A;  . FLEXIBLE SIGMOIDOSCOPY N/A 11/14/2019   Procedure: FLEXIBLE SIGMOIDOSCOPY;  Surgeon: Lin Landsman, MD;  Location: [facility] ENDOSCOPY;  Service: Gastroenterology;  Laterality: N/A;  . PELVIC LYMPH NODE DISSECTION N/A 12/10/2017   Procedure: PELVIC LYMPH NODE DISSECTION;  Surgeon: [name], MD;  Location: [facility] ORS;  Service:  Urology;  Laterality: N/A;  . PROSTATE SURGERY    . ROBOT ASSISTED LAPAROSCOPIC RADICAL PROSTATECTOMY N/A 12/10/2017   Procedure: ROBOTIC ASSISTED LAPAROSCOPIC RADICAL PROSTATECTOMY;  Surgeon: [name], MD;  Location: [facility] ORS;  Service: Urology;  Laterality: N/A;     Family History  Problem Relation Age of Onset  . Stroke Mother   . Stroke Father   . Cancer Sister        breast, lung  . Pulmonary Hypertension Sister   . Cancer Son        prostate  . Prostate cancer Son   . Hypertension Sister   . Bladder Cancer Neg Hx   . Kidney cancer Neg Hx      Social History   Tobacco Use  . Smoking status: Current Some Day Smoker    Years: 48.00    Types: Cigars  . Smokeless tobacco: Never Used  . Tobacco comment: smokes 3 small cigars per day  Substance Use Topics  . Alcohol use: Yes    Alcohol/week: 0.0 standard drinks    Comment: beer rarely    With staff assistance, above reviewed with the patient today.  ROS: As per HPI, otherwise no specific complaints on a limited and focused system review. Has some tooth issues, he thinks due to radiation. Needs to schedule  appt with dentist and rec'ed. denied any concerns for unintentional weight loss.  No results found for this or any previous visit (from the past 72 hour(s)).   PHQ2/9: Depression screen [facility] 2/9 06/22/2020 12/15/2019 06/13/2019 12/16/2018 06/14/2018  Decreased Interest 0 1 0 0 0  Down, Depressed, Hopeless 1 1 0 0 1  PHQ - 2 Score 1 2 0 0 1  Altered sleeping 0 0 0 0 1  Tired, decreased energy 0 0 1 0 1  Change in appetite 0 0 0 0 0  Feeling bad or failure about yourself  0 0 0 0 0  Trouble concentrating 0 0 0 0 0  Moving slowly or fidgety/restless 0 0 0 0 0  Suicidal thoughts 0 0 0 0 0  PHQ-9 Score 1 2 1  0 3  Difficult doing work/chores Not difficult at all Not difficult at all Not difficult at all Not difficult at all Somewhat difficult   PHQ-2/9 Result reviewed   Fall Risk: Fall Risk  06/22/2020 12/15/2019 06/13/2019 12/16/2018 06/14/2018  Falls in the past year? 0 0 0 0 No  Number falls in past yr: 0 0 0 0 -  Injury with Fall? 0 0 0 0 -  Risk for fall due to : - - - - -  Risk for fall due to: Comment - - - - -  Follow up - Falls evaluation completed Falls prevention discussed - -      Objective:   Vitals:   06/22/20 0726  BP: 126/72  Pulse: 99  Resp: 16  Temp: 97.8 F (36.6 C)  TempSrc: Temporal  SpO2: 96%  Weight: 136 lb 8 oz (61.9 kg)  Height: 5\' 8"  (1.727 m)    Body mass index is 20.75 kg/m. Wt Readings from Last 3 Encounters:  06/22/20 136 lb 8 oz (61.9 kg)  01/29/20 138 lb 14.4 oz (63 kg)  12/15/19 141 lb 14.4 oz (64.4 kg)    Physical Exam   NAD, masked, pleasant HEENT - [place]/AT, sclera anicteric, positive arcus, PERRL, EOMI, conj - non-inj'ed, poor dentition, pharynx clear Neck - supple, no adenopathy, no TM, carotids 2+ and = without bruits bilat Car -  RRR without m/g/r Pulm- RR and effort normal at rest, CTA without wheeze or rales Abd - soft, NT diffusely, ND,  Back - no CVA tenderness Skin- no rash noted on exposed areas,  Ext - no LE edema, no  active joints Neuro/psychiatric - affect was not flat, appropriate with conversation  Alert and oriented  Grossly non-focal - good strength on testing extremities, sensation intact to LT in distal extremities  Speech  normal   Results for orders placed or performed in visit on 01/22/20  PSA  Result Value Ref Range   Prostatic Specific Antigen <0.01 0.00 - 4.00 ng/mL       Assessment & Plan:   1. Prostate cancer ([place]) Continues to follow with oncology and radiation oncology, recent PSAs have been good, and continued follow-up recommended Still bothered by urinary incontinence from the radiation treatment, and had been followed with urology.  His last appointment in March was canceled due to his back issues, and recommended he make a follow-up again with them presently.  He states he will do so. - COMPLETE METABOLIC PANEL WITH GFR  2. Beta thalassemia trait 3. Microcytic anemia Remains on the folic acid and followed by Dr. Tasia Catchings, and continued follow-up recommended. We will recheck a CBC again today - CBC with Differential/Platelet  4. Mixed hyperlipidemia Last lipid panel was good, with slightly high triglycerides prior. We will recheck labs again today. - COMPLETE METABOLIC PANEL WITH GFR - Lipid panel  5. DDD (degenerative disc disease), lumbar Continues to manage with intermittent acetaminophen/ibuprofen products as needed, and did have PT in the past to help. We will follow-up again worsens.  6. Sciatica of right side As above.  7. BRBPR (bright red blood per rectum) Still has bleeding per rectum fairly routinely, felt to be secondary to the radiation treatment for his prostate cancer.  He did see GI, did have a procedure done with cauterization, although noted bowel movements after were very painful, and would like to not proceed with that again. Notes that MiraLAX is very helpful to manage.  To continue. Should follow-up if symptoms increasing, and he is aware if he starts  to have increased bleeding acutely, he needs to be seen more emergently in an emergency setting. We will check a CBC today. - CBC with Differential/Platelet  8. Tobacco abuse Has lessened the cigar use to less than once a week now, and quit cigarette use a long time ago.  Await lab results presently, and to follow-up with urology again as well as with dentist recommended, and follow-up again tentatively set for 6 months presently.  Follow-up sooner as needed.       Towanda Malkin, MD 06/22/20 7:43 AM

## 2020-06-22 ENCOUNTER — Ambulatory Visit (INDEPENDENT_AMBULATORY_CARE_PROVIDER_SITE_OTHER): Payer: Medicare HMO | Admitting: Internal Medicine

## 2020-06-22 ENCOUNTER — Encounter: Payer: Self-pay | Admitting: Internal Medicine

## 2020-06-22 ENCOUNTER — Other Ambulatory Visit: Payer: Self-pay

## 2020-06-22 VITALS — BP 126/72 | HR 99 | Temp 97.8°F | Resp 16 | Ht 68.0 in | Wt 136.5 lb

## 2020-06-22 DIAGNOSIS — D563 Thalassemia minor: Secondary | ICD-10-CM | POA: Diagnosis not present

## 2020-06-22 DIAGNOSIS — Z720 Tobacco use: Secondary | ICD-10-CM | POA: Diagnosis not present

## 2020-06-22 DIAGNOSIS — M5431 Sciatica, right side: Secondary | ICD-10-CM

## 2020-06-22 DIAGNOSIS — M51369 Other intervertebral disc degeneration, lumbar region without mention of lumbar back pain or lower extremity pain: Secondary | ICD-10-CM

## 2020-06-22 DIAGNOSIS — K625 Hemorrhage of anus and rectum: Secondary | ICD-10-CM

## 2020-06-22 DIAGNOSIS — C61 Malignant neoplasm of prostate: Secondary | ICD-10-CM

## 2020-06-22 DIAGNOSIS — D509 Iron deficiency anemia, unspecified: Secondary | ICD-10-CM | POA: Diagnosis not present

## 2020-06-22 DIAGNOSIS — M5136 Other intervertebral disc degeneration, lumbar region: Secondary | ICD-10-CM

## 2020-06-22 DIAGNOSIS — E782 Mixed hyperlipidemia: Secondary | ICD-10-CM

## 2020-06-22 LAB — COMPLETE METABOLIC PANEL WITH GFR
AG Ratio: 1.7 (calc) (ref 1.0–2.5)
ALT: 10 U/L (ref 9–46)
AST: 17 U/L (ref 10–35)
Albumin: 4.5 g/dL (ref 3.6–5.1)
Alkaline phosphatase (APISO): 74 U/L (ref 35–144)
BUN: 17 mg/dL (ref 7–25)
CO2: 29 mmol/L (ref 20–32)
Calcium: 9.8 mg/dL (ref 8.6–10.3)
Chloride: 104 mmol/L (ref 98–110)
Creat: 1.02 mg/dL (ref 0.70–1.18)
GFR, Est African American: 86 mL/min/{1.73_m2} (ref >=60)
GFR, Est Non African American: 74 mL/min/{1.73_m2} (ref >=60)
Globulin: 2.6 g/dL (calc) (ref 1.9–3.7)
Glucose, Bld: 86 mg/dL (ref 65–99)
Potassium: 4.8 mmol/L (ref 3.5–5.3)
Sodium: 141 mmol/L (ref 135–146)
Total Bilirubin: 0.5 mg/dL (ref 0.2–1.2)
Total Protein: 7.1 g/dL (ref 6.1–8.1)

## 2020-06-22 LAB — LIPID PANEL
Cholesterol: 165 mg/dL (ref <200)
HDL: 71 mg/dL (ref >=40)
LDL Cholesterol (Calc): 78 mg/dL (calc)
Non-HDL Cholesterol (Calc): 94 mg/dL (calc) (ref <130)
Total CHOL/HDL Ratio: 2.3 (calc) (ref <5.0)
Triglycerides: 77 mg/dL (ref <150)

## 2020-06-22 LAB — CBC WITH DIFFERENTIAL/PLATELET
Absolute Monocytes: 666 cells/uL (ref 200–950)
Basophils Absolute: 37 cells/uL (ref 0–200)
Basophils Relative: 0.5 %
Eosinophils Absolute: 59 cells/uL (ref 15–500)
Eosinophils Relative: 0.8 %
HCT: 40.3 % (ref 38.5–50.0)
Hemoglobin: 12.8 g/dL — ABNORMAL LOW (ref 13.2–17.1)
Lymphs Abs: 1917 cells/uL (ref 850–3900)
MCH: 22.8 pg — ABNORMAL LOW (ref 27.0–33.0)
MCHC: 31.8 g/dL — ABNORMAL LOW (ref 32.0–36.0)
MCV: 71.7 fL — ABNORMAL LOW (ref 80.0–100.0)
MPV: 10.4 fL (ref 7.5–12.5)
Monocytes Relative: 9 %
Neutro Abs: 4721 cells/uL (ref 1500–7800)
Neutrophils Relative %: 63.8 %
Platelets: 348 10*3/uL (ref 140–400)
RBC: 5.62 10*6/uL (ref 4.20–5.80)
RDW: 17.6 % — ABNORMAL HIGH (ref 11.0–15.0)
Total Lymphocyte: 25.9 %
WBC: 7.4 10*3/uL (ref 3.8–10.8)

## 2020-06-29 ENCOUNTER — Other Ambulatory Visit: Payer: Self-pay

## 2020-06-29 ENCOUNTER — Ambulatory Visit (INDEPENDENT_AMBULATORY_CARE_PROVIDER_SITE_OTHER): Payer: Medicare HMO

## 2020-06-29 VITALS — BP 118/72 | HR 81 | Ht 68.0 in | Wt 138.0 lb

## 2020-06-29 DIAGNOSIS — Z0000 Encounter for general adult medical examination without abnormal findings: Secondary | ICD-10-CM

## 2020-06-29 NOTE — Patient Instructions (Signed)
Jerry Pena , Thank you for taking time to come for your Medicare Wellness Visit. I appreciate your ongoing commitment to your health goals. Please review the following plan we discussed and let me know if I can assist you in the future.   Screening recommendations/referrals: Colonoscopy: done 07/29/2019. Repeat in 2022 Recommended yearly ophthalmology/optometry visit for glaucoma screening and checkup Recommended yearly dental visit for hygiene and checkup  Vaccinations: Influenza vaccine: done 10/09/19 Pneumococcal vaccine: done 06/13/17 Tdap vaccine: done 06/24/14 Shingles vaccine: done 07/22/19 & 12/29/19   Covid-19: done 02/20/20 & 03/12/20  Advanced directives: Advance directive discussed with you today. Even though you declined this today please call our office should you change your mind and we can give you the proper paperwork for you to fill out.  Conditions/risks identified: Recommend drinking 6-8 glasses of water per day  Next appointment: Follow up in one year for your annual wellness visit.   Preventive Care 10 Years and Older, Male Preventive care refers to lifestyle choices and visits with your health care provider that can promote health and wellness. What does preventive care include?  A yearly physical exam. This is also called an annual well check.  Dental exams once or twice a year.  Routine eye exams. Ask your health care provider how often you should have your eyes checked.  Personal lifestyle choices, including:  Daily care of your teeth and gums.  Regular physical activity.  Eating a healthy diet.  Avoiding tobacco and drug use.  Limiting alcohol use.  Practicing safe sex.  Taking low doses of aspirin every day.  Taking vitamin and mineral supplements as recommended by your health care provider. What happens during an annual well check? The services and screenings done by your health care provider during your annual well check will depend on your age,  overall health, lifestyle risk factors, and family history of disease. Counseling  Your health care provider may ask you questions about your:  Alcohol use.  Tobacco use.  Drug use.  Emotional well-being.  Home and relationship well-being.  Sexual activity.  Eating habits.  History of falls.  Memory and ability to understand (cognition).  Work and work Statistician. Screening  You may have the following tests or measurements:  Height, weight, and BMI.  Blood pressure.  Lipid and cholesterol levels. These may be checked every 5 years, or more frequently if you are over 75 years old.  Skin check.  Lung cancer screening. You may have this screening every year starting at age 74 if you have a 30-pack-year history of smoking and currently smoke or have quit within the past 15 years.  Fecal occult blood test (FOBT) of the stool. You may have this test every year starting at age 99.  Flexible sigmoidoscopy or colonoscopy. You may have a sigmoidoscopy every 5 years or a colonoscopy every 10 years starting at age 31.  Prostate cancer screening. Recommendations will vary depending on your family history and other risks.  Hepatitis C blood test.  Hepatitis B blood test.  Sexually transmitted disease (STD) testing.  Diabetes screening. This is done by checking your blood sugar (glucose) after you have not eaten for a while (fasting). You may have this done every 1-3 years.  Abdominal aortic aneurysm (AAA) screening. You may need this if you are a current or former smoker.  Osteoporosis. You may be screened starting at age 91 if you are at high risk. Talk with your health care provider about your test results, treatment  options, and if necessary, the need for more tests. Vaccines  Your health care provider may recommend certain vaccines, such as:  Influenza vaccine. This is recommended every year.  Tetanus, diphtheria, and acellular pertussis (Tdap, Td) vaccine. You may  need a Td booster every 10 years.  Zoster vaccine. You may need this after age 37.  Pneumococcal 13-valent conjugate (PCV13) vaccine. One dose is recommended after age 69.  Pneumococcal polysaccharide (PPSV23) vaccine. One dose is recommended after age 28. Talk to your health care provider about which screenings and vaccines you need and how often you need them. This information is not intended to replace advice given to you by your health care provider. Make sure you discuss any questions you have with your health care provider. Document Released: 01/07/2016 Document Revised: 08/30/2016 Document Reviewed: 10/12/2015 Elsevier Interactive Patient Education  2017 [employer] Prevention in the Home Falls can cause injuries. They can happen to people of all ages. There are many things you can do to make your home safe and to help prevent falls. What can I do on the outside of my home?  Regularly fix the edges of walkways and driveways and fix any cracks.  Remove anything that might make you trip as you walk through a door, such as a raised step or threshold.  Trim any bushes or trees on the path to your home.  Use bright outdoor lighting.  Clear any walking paths of anything that might make someone trip, such as rocks or tools.  Regularly check to see if handrails are loose or broken. Make sure that both sides of any steps have handrails.  Any raised decks and porches should have guardrails on the edges.  Have any leaves, snow, or ice cleared regularly.  Use sand or salt on walking paths during winter.  Clean up any spills in your garage right away. This includes oil or grease spills. What can I do in the bathroom?  Use night lights.  Install grab bars by the toilet and in the tub and shower. Do not use towel bars as grab bars.  Use non-skid mats or decals in the tub or shower.  If you need to sit down in the shower, use a plastic, non-slip stool.  Keep the floor  dry. Clean up any water that spills on the floor as soon as it happens.  Remove soap buildup in the tub or shower regularly.  Attach bath mats securely with double-sided non-slip rug tape.  Do not have throw rugs and other things on the floor that can make you trip. What can I do in the bedroom?  Use night lights.  Make sure that you have a light by your bed that is easy to reach.  Do not use any sheets or blankets that are too big for your bed. They should not hang down onto the floor.  Have a firm chair that has side arms. You can use this for support while you get dressed.  Do not have throw rugs and other things on the floor that can make you trip. What can I do in the kitchen?  Clean up any spills right away.  Avoid walking on wet floors.  Keep items that you use a lot in easy-to-reach places.  If you need to reach something above you, use a strong step stool that has a grab bar.  Keep electrical cords out of the way.  Do not use floor polish or wax that makes floors slippery.  If you must use wax, use non-skid floor wax.  Do not have throw rugs and other things on the floor that can make you trip. What can I do with my stairs?  Do not leave any items on the stairs.  Make sure that there are handrails on both sides of the stairs and use them. Fix handrails that are broken or loose. Make sure that handrails are as long as the stairways.  Check any carpeting to make sure that it is firmly attached to the stairs. Fix any carpet that is loose or worn.  Avoid having throw rugs at the top or bottom of the stairs. If you do have throw rugs, attach them to the floor with carpet tape.  Make sure that you have a light switch at the top of the stairs and the bottom of the stairs. If you do not have them, ask someone to add them for you. What else can I do to help prevent falls?  Wear shoes that:  Do not have high heels.  Have rubber bottoms.  Are comfortable and fit you  well.  Are closed at the toe. Do not wear sandals.  If you use a stepladder:  Make sure that it is fully opened. Do not climb a closed stepladder.  Make sure that both sides of the stepladder are locked into place.  Ask someone to hold it for you, if possible.  Clearly mark and make sure that you can see:  Any grab bars or handrails.  First and last steps.  Where the edge of each step is.  Use tools that help you move around (mobility aids) if they are needed. These include:  Canes.  Walkers.  Scooters.  Crutches.  Turn on the lights when you go into a dark area. Replace any light bulbs as soon as they burn out.  Set up your furniture so you have a clear path. Avoid moving your furniture around.  If any of your floors are uneven, fix them.  If there are any pets around you, be aware of where they are.  Review your medicines with your doctor. Some medicines can make you feel dizzy. This can increase your chance of falling. Ask your doctor what other things that you can do to help prevent falls. This information is not intended to replace advice given to you by your health care provider. Make sure you discuss any questions you have with your health care provider. Document Released: 10/07/2009 Document Revised: 05/18/2016 Document Reviewed: 01/15/2015 Elsevier Interactive Patient Education  2017 [employer].

## 2020-06-29 NOTE — Progress Notes (Addendum)
Subjective:   Jerry Pena is a 69 y.o. male who presents for Medicare Annual/Subsequent preventive examination.  Virtual Visit via Telephone Note  I connected with  Jerry Pena on 06/29/20 at  9:20 AM EDT by telephone and verified that I am speaking with the correct person using two identifiers.  Medicare Annual Wellness visit completed telephonically due to Covid-19 pandemic.   Location: Patient: home Provider: office   I discussed the limitations, risks, security and privacy concerns of performing an evaluation and management service by telephone and the availability of in person appointments. The patient expressed understanding and agreed to proceed.  Unable to perform video visit due to video visit attempted and failed and/or patient does not have video capability.   Some vital signs may be absent or patient reported.   Clemetine Marker, LPN    Review of Systems     Cardiac Risk Factors include: advanced age (>72men, >60 women);male gender     Objective:    Today's Vitals   06/29/20 0934  BP: 118/72  Pulse: 81  Weight: 138 lb (62.6 kg)  Height: 5\' 8"  (1.727 m)   Body mass index is 20.98 kg/m.  Advanced Directives 06/29/2020 01/29/2020 11/14/2019 10/07/2019 08/25/2019 07/29/2019 07/24/2019  Does Patient Have a Medical Advance Directive? No Yes Yes Yes Yes Yes Yes  Type of Advance Directive - [place];Living will [place];Living will [place];Living will - Living will [place];Living will  Does patient want to make changes to medical advance directive? - - - No - Patient declined - - -  Copy of [place] in Chart? - - No - copy requested No - copy requested - - No - copy requested  Would patient like information on creating a medical advance directive? No - Patient declined - - No - Patient declined - - -    Current Medications (verified) Outpatient Encounter Medications as  of 06/29/2020  Medication Sig  . acetaminophen (TYLENOL) 500 MG tablet Take 1,000 mg by mouth every 6 (six) hours as needed for moderate pain or headache.   . calcium-vitamin D (OSCAL WITH D) 500-200 MG-UNIT tablet Take 1 tablet by mouth.  . folic acid (FOLVITE) 128 MCG tablet Take 1 tablet (400 mcg total) by mouth daily.  Marland Kitchen ibuprofen (ADVIL,MOTRIN) 200 MG tablet Take 400 mg by mouth every 6 (six) hours as needed for headache or moderate pain.  . Melatonin 5 MG TABS Take 5 mg by mouth daily as needed.  . Multiple Vitamin (MULTIVITAMIN) tablet Take 1 tablet by mouth daily.  . polyethylene glycol (MIRALAX / GLYCOLAX) 17 g packet Take 17 g by mouth daily.  . vitamin B-12 (CYANOCOBALAMIN) 100 MCG tablet Take 100 mcg by mouth daily.  . vitamin C (ASCORBIC ACID) 500 MG tablet Take 500 mg by mouth daily.   No facility-administered encounter medications on file as of 06/29/2020.    Allergies (verified) Aspirin and Penicillins   History: Past Medical History:  Diagnosis Date  . Beta thalassemia trait 09/27/2018  . Cancer ([place]) 10/2017   Prostate  . DDD (degenerative disc disease), lumbar 06/13/2017   Lumbar imaging June 2018  . Elevated PSA 09/12/2017   Refer to urologist, Sept 2018  . Facet hypertrophy of lumbar region 06/13/2017   Lumbar imaging June 2018  . Hyperlipidemia    Past Surgical History:  Procedure Laterality Date  . CATARACT EXTRACTION W/ INTRAOCULAR LENS IMPLANT Left 3/ 28/2014  eyes  . CATARACT EXTRACTION W/ INTRAOCULAR LENS IMPLANT Right 04/23/2013  . COLONOSCOPY WITH PROPOFOL N/A 07/29/2019   Procedure: COLONOSCOPY WITH PROPOFOL;  Surgeon: Lin Landsman, MD;  Location: [facility] ENDOSCOPY;  Service: Gastroenterology;  Laterality: N/A;  . FLEXIBLE SIGMOIDOSCOPY N/A 11/14/2019   Procedure: FLEXIBLE SIGMOIDOSCOPY;  Surgeon: Lin Landsman, MD;  Location: [facility] ENDOSCOPY;  Service: Gastroenterology;  Laterality: N/A;  . PELVIC LYMPH NODE DISSECTION N/A 12/10/2017    Procedure: PELVIC LYMPH NODE DISSECTION;  Surgeon: [name], MD;  Location: [facility] ORS;  Service: Urology;  Laterality: N/A;  . PROSTATE SURGERY    . ROBOT ASSISTED LAPAROSCOPIC RADICAL PROSTATECTOMY N/A 12/10/2017   Procedure: ROBOTIC ASSISTED LAPAROSCOPIC RADICAL PROSTATECTOMY;  Surgeon: [name], MD;  Location: [facility] ORS;  Service: Urology;  Laterality: N/A;   Family History  Problem Relation Age of Onset  . Stroke Mother   . Stroke Father   . Cancer Sister        breast, lung  . Pulmonary Hypertension Sister   . Cancer Son        prostate  . Prostate cancer Son   . Hypertension Sister   . Bladder Cancer Neg Hx   . Kidney cancer Neg Hx    Social History   Socioeconomic History  . Marital status: Divorced    Spouse name: Not on file  . Number of children: 4  . Years of education: Not on file  . Highest education level: Associate degree: academic program  Occupational History  . Occupation: Retired  Tobacco Use  . Smoking status: Current Some Day Smoker    Years: 48.00    Types: Cigars  . Smokeless tobacco: Never Used  . Tobacco comment: occasional cigar  Vaping Use  . Vaping Use: Never used  Substance and Sexual Activity  . Alcohol use: Yes    Alcohol/week: 0.0 standard drinks    Comment: beer rarely  . Drug use: No    Comment: used marijuana more than 10 years ago  . Sexual activity: Not Currently  Other Topics Concern  . Not on file  Social History Narrative   Lives alone. Patient just buried his baby sister, Diane,10/2018   Social Determinants of Health   Financial Resource Strain: Low Risk   . Difficulty of Paying Living Expenses: Not hard at all  Food Insecurity: No Food Insecurity  . Worried About Charity fundraiser in the Last Year: Never true  . Ran Out of Food in the Last Year: Never true  Transportation Needs: No Transportation Needs  . Lack of Transportation (Medical): No  . Lack of Transportation (Non-Medical): No  Physical  Activity: Sufficiently Active  . Days of Exercise per Week: 4 days  . Minutes of Exercise per Session: 60 min  Stress: No Stress Concern Present  . Feeling of Stress : Only a little  Social Connections: Moderately Isolated  . Frequency of Communication with Friends and Family: More than three times a week  . Frequency of Social Gatherings with Friends and Family: Twice a week  . Attends Religious Services: More than 4 times per year  . Active Member of Clubs or Organizations: No  . Attends Archivist Meetings: Never  . Marital Status: Divorced    Tobacco Counseling Ready to quit: Not Answered Counseling given: Not Answered Comment: occasional cigar   Clinical Intake:  Pre-visit preparation completed: Yes  Pain : No/denies pain     BMI - recorded: 20.98 Nutritional Status: BMI of 19-24  Normal Nutritional Risks: None Diabetes: No  How often do you need to have someone help you when you read instructions, pamphlets, or other written materials from your doctor or pharmacy?: 1 - Never    Interpreter Needed?: No  Information entered by :: Clemetine Marker LPN   Activities of Daily Living In your present state of health, do you have any difficulty performing the following activities: 06/29/2020 06/22/2020  Hearing? N N  Comment declines hearing aids -  Vision? N N  Difficulty concentrating or making decisions? N N  Walking or climbing stairs? N N  Dressing or bathing? N N  Doing errands, shopping? N N  Preparing Food and eating ? N -  Using the Toilet? N -  In the past six months, have you accidently leaked urine? Y -  Comment wears incontinence pads for protection -  Do you have problems with loss of bowel control? N -  Managing your Medications? N -  Managing your Finances? N -  Housekeeping or managing your Housekeeping? N -  Some recent data might be hidden    Patient Care Team: Towanda Malkin, MD as PCP - General (Internal Medicine) [name], MD as Consulting Physician (Urology) Noreene Filbert, MD as Consulting Physician (Radiation Oncology)  Indicate any recent Medical Services you may have received from other than [facility] providers in the past year (date may be approximate).     Assessment:   This is a routine wellness examination for [place].  Hearing/Vision screen  Hearing Screening   125Hz  250Hz  500Hz  1000Hz  2000Hz  3000Hz  4000Hz  6000Hz  8000Hz   Right ear:           Left ear:           Comments: Pt denies hearing difficulty   Vision Screening Comments: Pt is past due for eye exam, previously seen by the [facility]; plans to establish care with new provider   Dietary issues and exercise activities discussed: Current Exercise Habits: Home exercise routine, Type of exercise: strength training/weights;stretching, Time (Minutes): 60, Frequency (Times/Week): 4, Weekly Exercise (Minutes/Week): 240, Intensity: Moderate, Exercise limited by: Other - see comments (urological conditions)  Goals    . DIET - INCREASE WATER INTAKE     Recommend to drink at least 6-8 8oz glasses of water per day.    . Quit Smoking     Pt started smoking again as a result of dealing with urinary incontinence from prostate surgery and would like to be able to quit.   If you wish to quit smoking, help is available. For free tobacco cessation program offerings call the [facility] at [phone] or Live Well Line at (423)207-4145. You may also visit [email or web address] or email [email or web address] for more information on other programs.        Depression Screen PHQ 2/9 Scores 06/29/2020 06/22/2020 12/15/2019 06/13/2019 12/16/2018 06/14/2018 06/07/2018  PHQ - 2 Score 1 1 2  0 0 1 0  PHQ- 9 Score 1 1 2 1  0 3 0    Fall Risk Fall Risk  06/29/2020 06/22/2020 12/15/2019 06/13/2019 12/16/2018  Falls in the past year? 0 0 0 0 0  Number falls in past yr: 0 0 0 0 0  Injury with Fall? 0 0 0 0 0  Risk for fall due to : No Fall Risks - - - -    Risk for fall due to: Comment - - - - -  Follow up Falls prevention discussed - Falls evaluation completed Falls prevention  discussed -    Any stairs in or around the home? Yes  If so, are there any without handrails? Yes  Home free of loose throw rugs in walkways, pet beds, electrical cords, etc? Yes  Adequate lighting in your home to reduce risk of falls? Yes   ASSISTIVE DEVICES UTILIZED TO PREVENT FALLS:  Life alert? No  Use of a cane, walker or w/c? No  Grab bars in the bathroom? Yes  Shower chair or bench in shower? No  Elevated toilet seat or a handicapped toilet? No   TIMED UP AND GO:  Was the test performed? No . Telephonic visit.   Cognitive Function:     6CIT Screen 06/29/2020 06/13/2019 06/07/2018  What Year? 0 points 0 points 0 points  What month? 0 points 0 points 0 points  What time? 0 points 0 points 0 points  Count back from 20 0 points 0 points 0 points  Months in reverse 0 points 0 points 0 points  Repeat phrase 0 points 0 points 8 points  Total Score 0 0 8    Immunizations Immunization History  Administered Date(s) Administered  . Fluad Quad(high Dose 65+) 10/09/2019  . Influenza, High Dose Seasonal PF 09/03/2017, 09/23/2018  . Influenza,inj,Quad PF,6+ Mos 09/09/2015  . PFIZER SARS-COV-2 Vaccination 02/20/2020, 03/12/2020  . Pneumococcal Conjugate-13 06/13/2017  . Pneumococcal Polysaccharide-23 09/30/2012, 06/24/2014  . Td 06/24/2014  . Tdap 01/21/2013  . Zoster 03/17/2013  . Zoster Recombinat (Shingrix) 07/22/2019, 12/29/2019    TDAP status: Up to date   Flu Vaccine status: Up to date   Pneumococcal vaccine status: Up to date   Covid-19 vaccine status: Completed vaccines  Qualifies for Shingles Vaccine? Yes   Zostavax completed Yes   Shingrix Completed?: Yes  Screening Tests Health Maintenance  Topic Date Due  . PNA vac Low Risk Adult (2 of 2 - PPSV23) 06/25/2019  . INFLUENZA VACCINE  07/25/2020  . COLONOSCOPY  07/28/2021  .  TETANUS/TDAP  06/24/2024  . COVID-19 Vaccine  Completed  . Hepatitis C Screening  Completed    Health Maintenance  Health Maintenance Due  Topic Date Due  . PNA vac Low Risk Adult (2 of 2 - PPSV23) 06/25/2019    Colorectal cancer screening: Completed 07/29/19. Repeat every 2 years  Lung Cancer Screening: (Low Dose CT Chest recommended if Age 91-80 years, 30 pack-year currently smoking OR have quit w/in 15years.) does not qualify.    Additional Screening:  Hepatitis C Screening: does qualify; Completed 09/03/17  Vision Screening: Recommended annual ophthalmology exams for early detection of glaucoma and other disorders of the eye. Is the patient up to date with their annual eye exam?  No  Who is the provider or what is the name of the office in which the patient attends annual eye exams? [facility] - plans to establish with new provider If pt is not established with a provider, would they like to be referred to a provider to establish care? No .   Dental Screening: Recommended annual dental exams for proper oral hygiene  Community Resource Referral / Chronic Care Management: CRR required this visit?  No   CCM required this visit?  No      Plan:     I have personally reviewed and noted the following in the patient's chart:   . Medical and social history . Use of alcohol, tobacco or illicit drugs  . Current medications and supplements . Functional ability and status . Nutritional status . Physical activity .  Advanced directives . List of other physicians . Hospitalizations, surgeries, and ER visits in previous 12 months . Vitals . Screenings to include cognitive, depression, and falls . Referrals and appointments  In addition, I have reviewed and discussed with patient certain preventive protocols, quality metrics, and best practice recommendations. A written personalized care plan for preventive services as well as general preventive health recommendations were provided  to patient.     Clemetine Marker, LPN   12/30/6061   Nurse Notes: pt c/o damage to enamel on teeth from radiation and plans to see an oral surgeon. Pt to contact office if referral needed.   Pt also c/o bleeding with bowel movements at times but was told by GI this could be expected and aware of signs/sxs that may indicate further problem. He does not wish to repeat flexible sigmoidoscopy to cauterize blood vessels again due to pain after procedure.

## 2020-08-09 ENCOUNTER — Ambulatory Visit (INDEPENDENT_AMBULATORY_CARE_PROVIDER_SITE_OTHER): Payer: Medicare HMO | Admitting: Internal Medicine

## 2020-08-09 ENCOUNTER — Encounter: Payer: Self-pay | Admitting: Internal Medicine

## 2020-08-09 ENCOUNTER — Telehealth: Payer: Self-pay

## 2020-08-09 ENCOUNTER — Other Ambulatory Visit: Payer: Self-pay

## 2020-08-09 VITALS — BP 114/70 | HR 91 | Temp 97.9°F | Resp 16 | Ht 68.0 in | Wt 138.5 lb

## 2020-08-09 DIAGNOSIS — K4090 Unilateral inguinal hernia, without obstruction or gangrene, not specified as recurrent: Secondary | ICD-10-CM

## 2020-08-09 DIAGNOSIS — R198 Other specified symptoms and signs involving the digestive system and abdomen: Secondary | ICD-10-CM

## 2020-08-09 NOTE — Patient Instructions (Signed)
An ultrasound of the area was ordered and await that result presently

## 2020-08-09 NOTE — Progress Notes (Signed)
Patient ID: Jerry Pena, male    DOB: 27-Jul-1951, 69 y.o.   MRN: [id number]  PCP: Towanda Malkin, MD  Chief Complaint  Patient presents with  . lump in groin    noticed it in the shower one day last week, not painful    Subjective:   Jerry Pena is a 69 y.o. male, presents to clinic with CC of the following:  Chief Complaint  Patient presents with  . lump in groin    noticed it in the shower one day last week, not painful    HPI:  Patient is a 69 year old male Last visit with me was June 22, 2020 Follows up today with concerns for a lump in the groin area.  He has a history of prostate cancer, completed salvage radiation therapy after biochemical failure after his robotic assisted prostatectomy in December 2018 for Gleason 7 adenocarcinoma of the prostate. Has continued to follow with urology and radiation oncology, with his last visit with radiation oncology in February 2021.  At that time he was noted to be doing well, and a follow-up in 1 year planned with a PSA at that time.  It was noted he does still deal with incontinence, did go to pelvic floor PT in his past.  His last follow-up with urology in March was canceled due to back issues, I did encourage him to reschedule an appointment for follow-up on our last visit. Lab Results  Component Value Date   PSA1 <0.1 08/12/2019   PSA1 <0.1 02/06/2019   PSA1 <0.1 11/13/2018   PSA 4.4 (H) 09/03/2017  He had a PSA 01/22/2020 that was less than 0.1   He also follows with Dr. Tasia Catchings for microcytic anemia and neutropenia, next visit planned in October.  He was noted to have beta thalassemia trait and suggested to avoid iron supplementation and taking folic acid daily. Lab Results  Component Value Date   WBC 7.4 06/22/2020   HGB 12.8 (L) 06/22/2020   HCT 40.3 06/22/2020   MCV 71.7 (L) 06/22/2020   PLT 348 06/22/2020   Returns today with concerns for a lump in the groin area that he noticed when showering 1 day last  week.  Denies being painful.  It is in the right suprapubic area. He denies any urinary symptoms of concern with no dysuria, no blood in the urine, no increased frequency, still leaking which is not new for him.  Still has some blood in the stool, all that was after his radiation, and has had no significant changes in that in the recent past. Denies any unintentional weight loss, fevers, significantly increased fatigue, and all in all notes there has been no other concerns there in the recent past. He is waiting to hear back from urology to get that follow-up visit when asked, hopes to have it in the next couple weeks noted.  Patient Active Problem List   Diagnosis Date Noted  . Chronic constipation 10/14/2019  . Rectal bleeding 10/14/2019  . Radiation proctitis   . Migraine 01/23/2019  . Cataract 01/23/2019  . Beta thalassemia trait 09/27/2018  . Neutropenia ([place]) 09/23/2018  . History of hormone therapy 06/07/2018  . Prostate cancer ([place]) 12/10/2017  . Facet hypertrophy of lumbar region 06/13/2017  . DDD (degenerative disc disease), lumbar 06/13/2017  . Hyperlipidemia 09/09/2015  . Sciatica 09/09/2015  . Arthritis, lumbar spine 09/09/2015  . Tobacco abuse 09/09/2015  . Personal history of colonic polyps 09/09/2015      Current Outpatient  Medications:  .  acetaminophen (TYLENOL) 500 MG tablet, Take 1,000 mg by mouth every 6 (six) hours as needed for moderate pain or headache. , Disp: , Rfl:  .  calcium-vitamin D (OSCAL WITH D) 500-200 MG-UNIT tablet, Take 1 tablet by mouth., Disp: , Rfl:  .  folic acid (FOLVITE) 481 MCG tablet, Take 1 tablet (400 mcg total) by mouth daily., Disp: 90 tablet, Rfl: 4 .  ibuprofen (ADVIL,MOTRIN) 200 MG tablet, Take 400 mg by mouth every 6 (six) hours as needed for headache or moderate pain., Disp: , Rfl:  .  Melatonin 5 MG TABS, Take 5 mg by mouth daily as needed., Disp: , Rfl:  .  Multiple Vitamin (MULTIVITAMIN) tablet, Take 1 tablet by mouth daily.,  Disp: , Rfl:  .  polyethylene glycol (MIRALAX / GLYCOLAX) 17 g packet, Take 17 g by mouth daily., Disp: , Rfl:  .  vitamin B-12 (CYANOCOBALAMIN) 100 MCG tablet, Take 100 mcg by mouth daily., Disp: , Rfl:  .  vitamin C (ASCORBIC ACID) 500 MG tablet, Take 500 mg by mouth daily., Disp: , Rfl:    Allergies  Allergen Reactions  . Aspirin Other (See Comments)    Upset stomach, indigestion and heart burn  . Penicillins Hives and Other (See Comments)    Has patient had a PCN reaction causing immediate rash, facial/tongue/throat swelling, SOB or lightheadedness with hypotension: Yes Has patient had a PCN reaction causing severe rash involving mucus membranes or skin necrosis: No Has patient had a PCN reaction that required hospitalization: Yes Has patient had a PCN reaction occurring within the last 10 years: No If all of the above answers are "NO", then may proceed with Cephalosporin use.      Past Surgical History:  Procedure Laterality Date  . CATARACT EXTRACTION W/ INTRAOCULAR LENS IMPLANT Left 3/ 28/2014   eyes  . CATARACT EXTRACTION W/ INTRAOCULAR LENS IMPLANT Right 04/23/2013  . COLONOSCOPY WITH PROPOFOL N/A 07/29/2019   Procedure: COLONOSCOPY WITH PROPOFOL;  Surgeon: Lin Landsman, MD;  Location: [facility] ENDOSCOPY;  Service: Gastroenterology;  Laterality: N/A;  . FLEXIBLE SIGMOIDOSCOPY N/A 11/14/2019   Procedure: FLEXIBLE SIGMOIDOSCOPY;  Surgeon: Lin Landsman, MD;  Location: [facility] ENDOSCOPY;  Service: Gastroenterology;  Laterality: N/A;  . PELVIC LYMPH NODE DISSECTION N/A 12/10/2017   Procedure: PELVIC LYMPH NODE DISSECTION;  Surgeon: [name], MD;  Location: [facility] ORS;  Service: Urology;  Laterality: N/A;  . PROSTATE SURGERY    . ROBOT ASSISTED LAPAROSCOPIC RADICAL PROSTATECTOMY N/A 12/10/2017   Procedure: ROBOTIC ASSISTED LAPAROSCOPIC RADICAL PROSTATECTOMY;  Surgeon: [name], MD;  Location: [facility] ORS;  Service: Urology;  Laterality: N/A;     Family History   Problem Relation Age of Onset  . Stroke Mother   . Stroke Father   . Cancer Sister        breast, lung  . Pulmonary Hypertension Sister   . Cancer Son        prostate  . Prostate cancer Son   . Hypertension Sister   . Bladder Cancer Neg Hx   . Kidney cancer Neg Hx      Social History   Tobacco Use  . Smoking status: Current Some Day Smoker    Years: 48.00    Types: Cigars  . Smokeless tobacco: Never Used  . Tobacco comment: occasional cigar  Substance Use Topics  . Alcohol use: Yes    Alcohol/week: 0.0 standard drinks    Comment: beer rarely    With staff assistance, above  reviewed with the patient today.  ROS: As per HPI, otherwise no specific complaints on a limited and focused system review   No results found for this or any previous visit (from the past 72 hour(s)).   PHQ2/9: Depression screen [facility] 2/9 08/09/2020 06/29/2020 06/22/2020 12/15/2019 06/13/2019  Decreased Interest 0 0 0 1 0  Down, Depressed, Hopeless 0 1 1 1  0  PHQ - 2 Score 0 1 1 2  0  Altered sleeping 1 0 0 0 0  Tired, decreased energy 1 0 0 0 1  Change in appetite 0 0 0 0 0  Feeling bad or failure about yourself  0 0 0 0 0  Trouble concentrating 0 0 0 0 0  Moving slowly or fidgety/restless 0 0 0 0 0  Suicidal thoughts 0 0 0 0 0  PHQ-9 Score 2 1 1 2 1   Difficult doing work/chores Not difficult at all Not difficult at all Not difficult at all Not difficult at all Not difficult at all  Some recent data might be hidden   PHQ-2/9 Result reviewed   Fall Risk: Fall Risk  08/09/2020 06/29/2020 06/22/2020 12/15/2019 06/13/2019  Falls in the past year? 0 0 0 0 0  Number falls in past yr: 0 0 0 0 0  Injury with Fall? 0 0 0 0 0  Risk for fall due to : - No Fall Risks - - -  Risk for fall due to: Comment - - - - -  Follow up - Falls prevention discussed - Falls evaluation completed Falls prevention discussed      Objective:   Vitals:   08/09/20 0731  BP: 114/70  Pulse: 91  Resp: 16  Temp: 97.9 F  (36.6 C)  TempSrc: Oral  SpO2: 99%  Weight: 138 lb 8 oz (62.8 kg)  Height: 5\' 8"  (1.727 m)    Body mass index is 21.06 kg/m.  Physical Exam   NAD, masked, pleasant HEENT - [place]/AT, sclera anicteric,  Abd - soft, NT diffusely,  GU-a raised area was present in the right suprapubic region, at significantly lessened when was supine and relaxed.  With a partial sit up attempt, the raised area increased, was not tender to palpate, and he denied any pain throughout the assessment.  There was no overlying erythema, no inguinal adenopathy noted, No marked gap noted when testing for inguinal hernia with cough, and not painful with this test. No scrotal mass present. Neuro/psychiatric - affect was not flat, appropriate with conversation  Alert, with speech normal  Results for orders placed or performed in visit on 06/22/20  COMPLETE METABOLIC PANEL WITH GFR  Result Value Ref Range   Glucose, Bld 86 65 - 99 mg/dL   BUN 17 7 - 25 mg/dL   Creat 1.02 0.70 - 1.18 mg/dL   GFR, Est Non African American 74 > OR = 60 mL/min/1.29m2   GFR, Est African American 86 > OR = 60 mL/min/1.49m2   BUN/Creatinine Ratio NOT APPLICABLE 6 - 22 (calc)   Sodium 141 135 - 146 mmol/L   Potassium 4.8 3.5 - 5.3 mmol/L   Chloride 104 98 - 110 mmol/L   CO2 29 20 - 32 mmol/L   Calcium 9.8 8.6 - 10.3 mg/dL   Total Protein 7.1 6.1 - 8.1 g/dL   Albumin 4.5 3.6 - 5.1 g/dL   Globulin 2.6 1.9 - 3.7 g/dL (calc)   AG Ratio 1.7 1.0 - 2.5 (calc)   Total Bilirubin 0.5 0.2 - 1.2 mg/dL  Alkaline phosphatase (APISO) 74 35 - 144 U/L   AST 17 10 - 35 U/L   ALT 10 9 - 46 U/L  Lipid panel  Result Value Ref Range   Cholesterol 165 <200 mg/dL   HDL 71 > OR = 40 mg/dL   Triglycerides 77 <150 mg/dL   LDL Cholesterol (Calc) 78 mg/dL (calc)   Total CHOL/HDL Ratio 2.3 <5.0 (calc)   Non-HDL Cholesterol (Calc) 94 <130 mg/dL (calc)  CBC with Differential/Platelet  Result Value Ref Range   WBC 7.4 3.8 - 10.8 Thousand/uL   RBC 5.62 4.20  - 5.80 Million/uL   Hemoglobin 12.8 (L) 13.2 - 17.1 g/dL   HCT 40.3 38 - 50 %   MCV 71.7 (L) 80.0 - 100.0 fL   MCH 22.8 (L) 27.0 - 33.0 pg   MCHC 31.8 (L) 32.0 - 36.0 g/dL   RDW 17.6 (H) 11.0 - 15.0 %   Platelets 348 140 - 400 Thousand/uL   MPV 10.4 7.5 - 12.5 fL   Neutro Abs 4,721 1,500 - 7,800 cells/uL   Lymphs Abs 1,917 850 - 3,900 cells/uL   Absolute Monocytes 666 200 - 950 cells/uL   Eosinophils Absolute 59 15 - 500 cells/uL   Basophils Absolute 37 0 - 200 cells/uL   Neutrophils Relative % 63.8 %   Total Lymphocyte 25.9 %   Monocytes Relative 9.0 %   Eosinophils Relative 0.8 %   Basophils Relative 0.5 %       Assessment & Plan:  1. Suprapubic fullness, not painful 2. Non-recurrent unilateral inguinal hernia without obstruction or gangrene Discussed with the patient that this likely a hernia type process, and reassuring no adenopathy concerns.  Note is not painful. Felt best to get an ultrasound to help further evaluate Await that result, and discussed possible next steps, which may include a surgical opinion, although did discuss with him that if it is not symptomatic, sometimes is not unreasonable to follow if it is a hernia type process.  Keep his planned follow-up with urology as well in the near future as planned.  Getting another opinion from them can be helpful as well.  - US PELVIS (TRANSABDOMINAL ONLY); Future  Await the ultrasound result presently Did note to him if he develops more sharp and severe pains, the importance of being seen emergently at that time in an ER setting, and he was understanding of that.      Towanda Malkin, MD 08/09/20 7:39 AM

## 2020-08-09 NOTE — Telephone Encounter (Signed)
I called this patient to inform him that he has been scheduled to have his ultrasound this Wednesday in [place] but he said that he will not be able to do it due to transportation issues. I then gave him the number to centralized scheduling so that he could call them and reschedule.

## 2020-08-11 ENCOUNTER — Ambulatory Visit: Payer: Medicare HMO

## 2020-08-17 ENCOUNTER — Other Ambulatory Visit: Payer: Self-pay | Admitting: Internal Medicine

## 2020-08-17 ENCOUNTER — Ambulatory Visit
Admission: RE | Admit: 2020-08-17 | Discharge: 2020-08-17 | Disposition: A | Discharge status: Home / Self Care | Payer: Medicare HMO | Source: Ambulatory Visit | Attending: Internal Medicine | Admitting: Internal Medicine

## 2020-08-17 ENCOUNTER — Other Ambulatory Visit: Payer: Self-pay

## 2020-08-17 DIAGNOSIS — K4090 Unilateral inguinal hernia, without obstruction or gangrene, not specified as recurrent: Secondary | ICD-10-CM | POA: Insufficient documentation

## 2020-08-17 DIAGNOSIS — R198 Other specified symptoms and signs involving the digestive system and abdomen: Secondary | ICD-10-CM | POA: Diagnosis not present

## 2020-08-17 DIAGNOSIS — R2241 Localized swelling, mass and lump, right lower limb: Secondary | ICD-10-CM | POA: Diagnosis not present

## 2020-08-17 DIAGNOSIS — R1909 Other intra-abdominal and pelvic swelling, mass and lump: Secondary | ICD-10-CM | POA: Diagnosis not present

## 2020-08-18 NOTE — Progress Notes (Signed)
[name], Please let Jerry Pena know that the ultrasound results were reviewed. The impression was as follows: IMPRESSION: Corresponding to the right suprapubic/groin lump appears to be a fat containing hernia. There is no bowel containing hernia identified.  That is relatively good news, and at this point I would recommend just monitoring.  If it is increasing over time, becoming painful, or other more concerns are arising, should follow-up to discuss potentially having him seen by a general surgeon to discuss potential repair of this fascial defect. Thanks [facility]

## 2020-09-02 ENCOUNTER — Other Ambulatory Visit: Payer: Medicare HMO

## 2020-09-02 ENCOUNTER — Other Ambulatory Visit: Payer: Self-pay

## 2020-09-02 ENCOUNTER — Ambulatory Visit: Payer: Medicare HMO | Admitting: Urology

## 2020-09-02 DIAGNOSIS — C61 Malignant neoplasm of prostate: Secondary | ICD-10-CM

## 2020-09-03 LAB — PSA: Prostate Specific Ag, Serum: <0.1 ng/mL (ref 0.0–4.0)

## 2020-09-06 NOTE — Progress Notes (Signed)
09/07/2020 10:50 AM   Arma Heading Dec 29, 1950 [id number]  Referring provider: Towanda Malkin, MD [address] [place],  [place] [address] Chief Complaint  Patient presents with  . Prostate Cancer    1year    HPI: Jerry Pena is a 69 y.o. male who returns for an annual follow up of prostate cancer, stress incontinence of urine and erectile dysfunction after radical prostatectomy.    He has a personal history of prostate cancer.  He is s/pradical robotic prostatectomy with bilateral lymph node dissection on 12/10/2017. A non nerve sparing approach was used on the right side with partial nerve sparing on the left.  Surgical pathology consistent with Gleason 4+3 prostate cancer since of extraprostatic extension but negative margins. In addition to this, 1 of 4 nodes from the right pelvic lymph nodes including external iliac and obturator positive, 0 of 2 on the left. pT3aN1 Mx  Postop PSA 0.2. Referred to radiation oncology foradjvanttreatment (whole pelvic radiation completed 07/2018). He received Lupron on 2/19 x 3 month depo buthas declined further injections due to severe hot flashes and intolerance of this medication. He has refused any further medication for treatment of his possibly micrometastatic disease.  He failed PDE 5 inhibitors.  He was previously using intracavernosal injections which were effective on a few occasions but more recently have not been working.  He is only using 0.7 cc never escalated the dose beyond this volume.Marland Kitchen  He is not try to titrate up the dose.    He still has medication in the freezer.  He is interested in working on this further.  PSA was undetectable on 09/02/20.   Today his leakage has worsened. He has leakage throughout the entire day. He has had no improvement with PT. He is changing his pads every 2 hours.  He is wet both in the daytime and nighttime.    All bowel movements contain blood. He has occasional  constipation with associated pain x 2-3 hours. He is MiraLax daily. He reports a hernia without pain secondary from straining.   He states that he is not interested in any surgeries.    PSA trend: Component     Latest Ref Rng & Units 04/19/2018 11/13/2018 02/06/2019 08/12/2019  Prostate Specific Ag, Serum     0.0 - 4.0 ng/mL <0.1 <0.1 <0.1 <0.1   Component     Latest Ref Rng & Units 09/02/2020  Prostate Specific Ag, Serum     0.0 - 4.0 ng/mL <0.1     PMH: Past Medical History:  Diagnosis Date  . Beta thalassemia trait 09/27/2018  . Cancer ([place]) 10/2017   Prostate  . DDD (degenerative disc disease), lumbar 06/13/2017   Lumbar imaging June 2018  . Elevated PSA 09/12/2017   Refer to urologist, Sept 2018  . Facet hypertrophy of lumbar region 06/13/2017   Lumbar imaging June 2018  . Hyperlipidemia     Surgical History: Past Surgical History:  Procedure Laterality Date  . CATARACT EXTRACTION W/ INTRAOCULAR LENS IMPLANT Left 3/ 28/2014   eyes  . CATARACT EXTRACTION W/ INTRAOCULAR LENS IMPLANT Right 04/23/2013  . COLONOSCOPY WITH PROPOFOL N/A 07/29/2019   Procedure: COLONOSCOPY WITH PROPOFOL;  Surgeon: Lin Landsman, MD;  Location: [facility] ENDOSCOPY;  Service: Gastroenterology;  Laterality: N/A;  . FLEXIBLE SIGMOIDOSCOPY N/A 11/14/2019   Procedure: FLEXIBLE SIGMOIDOSCOPY;  Surgeon: Lin Landsman, MD;  Location[facility] ENDOSCOPY;  Service: Gastroenterology;  Laterality: N/A;  . PELVIC LYMPH NODE DISSECTION N/A 12/10/2017  Procedure: PELVIC LYMPH NODE DISSECTION;  Surgeon: [name], MD;  Location: [facility] ORS;  Service: Urology;  Laterality: N/A;  . PROSTATE SURGERY    . ROBOT ASSISTED LAPAROSCOPIC RADICAL PROSTATECTOMY N/A 12/10/2017   Procedure: ROBOTIC ASSISTED LAPAROSCOPIC RADICAL PROSTATECTOMY;  Surgeon: [name], MD;  Location: [facility] ORS;  Service: Urology;  Laterality: N/A;    Home Medications:  Allergies as of 09/07/2020      Reactions   Aspirin Other (See  Comments)   Upset stomach, indigestion and heart burn   Penicillins Hives, Other (See Comments)   Has patient had a PCN reaction causing immediate rash, facial/tongue/throat swelling, SOB or lightheadedness with hypotension: Yes Has patient had a PCN reaction causing severe rash involving mucus membranes or skin necrosis: No Has patient had a PCN reaction that required hospitalization: Yes Has patient had a PCN reaction occurring within the last 10 years: No If all of the above answers are "NO", then may proceed with Cephalosporin use.      Medication List       Accurate as of September 07, 2020 10:50 AM. If you have any questions, ask your nurse or doctor.        acetaminophen 500 MG tablet Commonly known as: TYLENOL Take 1,000 mg by mouth every 6 (six) hours as needed for moderate pain or headache.   calcium-vitamin D 500-200 MG-UNIT tablet Commonly known as: OSCAL WITH D Take 1 tablet by mouth.   folic acid 696 MCG tablet Commonly known as: FOLVITE Take 1 tablet (400 mcg total) by mouth daily.   ibuprofen 200 MG tablet Commonly known as: ADVIL Take 400 mg by mouth every 6 (six) hours as needed for headache or moderate pain.   melatonin 5 MG Tabs Take 5 mg by mouth daily as needed.   multivitamin tablet Take 1 tablet by mouth daily.   polyethylene glycol 17 g packet Commonly known as: MIRALAX / GLYCOLAX Take 17 g by mouth daily.   vitamin B-12 100 MCG tablet Commonly known as: CYANOCOBALAMIN Take 100 mcg by mouth daily.   vitamin C 500 MG tablet Commonly known as: ASCORBIC ACID Take 500 mg by mouth daily.       Allergies:  Allergies  Allergen Reactions  . Aspirin Other (See Comments)    Upset stomach, indigestion and heart burn  . Penicillins Hives and Other (See Comments)    Has patient had a PCN reaction causing immediate rash, facial/tongue/throat swelling, SOB or lightheadedness with hypotension: Yes Has patient had a PCN reaction causing severe rash  involving mucus membranes or skin necrosis: No Has patient had a PCN reaction that required hospitalization: Yes Has patient had a PCN reaction occurring within the last 10 years: No If all of the above answers are "NO", then may proceed with Cephalosporin use.     Family History: Family History  Problem Relation Age of Onset  . Stroke Mother   . Stroke Father   . Cancer Sister        breast, lung  . Pulmonary Hypertension Sister   . Cancer Son        prostate  . Prostate cancer Son   . Hypertension Sister   . Bladder Cancer Neg Hx   . Kidney cancer Neg Hx     Social History:  reports that he has been smoking cigars. He has smoked for the past 48.00 years. He has never used smokeless tobacco. He reports current alcohol use. He reports that he does not use  drugs.   Physical Exam: BP 96/68   Pulse 90   Ht 5\' 8"  (1.727 m)   Wt 140 lb (63.5 kg)   BMI 21.29 kg/m   Constitutional:  Alert and oriented, No acute distress. HEENT: [place] AT, moist mucus membranes.  Trachea midline, no masses. Cardiovascular: No clubbing, cyanosis, or edema. Respiratory: Normal respiratory effort, no increased work of breathing. Skin: No rashes, bruises or suspicious lesions. Neurologic: Grossly intact, no focal deficits, moving all 4 extremities. Psychiatric: Normal mood and affect.  Laboratory Data:  Lab Results  Component Value Date   CREATININE 1.02 06/22/2020    Assessment & Plan:    1. Prostate cancer ([place]) pT3a N1 with persistent PSA of 0.2s/p RALP with BPNLD 11/2017  Stagingvia axman PET negative following procedure S/p whole pelvic radiation Has refused further ADT Currently NED PSA remains undetectable   2. Stress incontinence of urine Discussed surgical intervention for incontinence with Dr. Francesca Jewett at [facility].  Patient is not interested in surgical intervention at this time.   3. Family history of malignant neoplasm Previously referred to genetics, did not persue  4.  Erectile dysfunction after radical prostatectomy  He failed PDE 5 inhibitors. Previously tried intercavernousal injections which was titrated up to 1 mL sequentially without success.  Patient is not interested in a penile prosthesis.  Start super tri-mix at 5 mcg and titrate up as needed.   Follow up in 1 year with PSA. (will ask primary to check PSA at 6 mo interval)    [facility] [address], [place], [place] [address] [phone]  I, Selena Batten, am acting as a scribe for Dr. [name].  I have reviewed the above documentation for accuracy and completeness, and I agree with the above.   [name], MD  I spent 30 total minutes on the day of the encounter including pre-visit review of the medical record, face-to-face time with the patient, and post visit ordering of labs/imaging/tests.

## 2020-09-07 ENCOUNTER — Encounter: Payer: Self-pay | Admitting: Urology

## 2020-09-07 ENCOUNTER — Ambulatory Visit (INDEPENDENT_AMBULATORY_CARE_PROVIDER_SITE_OTHER): Payer: Medicare HMO | Admitting: Urology

## 2020-09-07 ENCOUNTER — Other Ambulatory Visit: Payer: Self-pay

## 2020-09-07 VITALS — BP 96/68 | HR 90 | Ht 68.0 in | Wt 140.0 lb

## 2020-09-07 DIAGNOSIS — C61 Malignant neoplasm of prostate: Secondary | ICD-10-CM | POA: Diagnosis not present

## 2020-09-07 MED ORDER — NONFORMULARY OR COMPOUNDED ITEM: 6 refills | Status: DC

## 2020-09-07 NOTE — Patient Instructions (Addendum)
Super Trimix sent to [place] Starting dose 0.3ml titrate up to 59ml as needed

## 2020-10-04 ENCOUNTER — Encounter: Payer: Self-pay | Admitting: Oncology

## 2020-10-04 ENCOUNTER — Inpatient Hospital Stay: Payer: Medicare HMO | Attending: Oncology

## 2020-10-04 ENCOUNTER — Other Ambulatory Visit: Payer: Self-pay

## 2020-10-04 ENCOUNTER — Inpatient Hospital Stay (HOSPITAL_BASED_OUTPATIENT_CLINIC_OR_DEPARTMENT_OTHER): Payer: Medicare HMO | Admitting: Oncology

## 2020-10-04 VITALS — BP 114/83 | HR 62 | Temp 96.1°F | Resp 16 | Ht 68.0 in | Wt 140.0 lb

## 2020-10-04 DIAGNOSIS — D563 Thalassemia minor: Secondary | ICD-10-CM | POA: Diagnosis not present

## 2020-10-04 DIAGNOSIS — M5136 Other intervertebral disc degeneration, lumbar region: Secondary | ICD-10-CM | POA: Diagnosis not present

## 2020-10-04 DIAGNOSIS — C61 Malignant neoplasm of prostate: Secondary | ICD-10-CM | POA: Insufficient documentation

## 2020-10-04 DIAGNOSIS — Z801 Family history of malignant neoplasm of trachea, bronchus and lung: Secondary | ICD-10-CM | POA: Diagnosis not present

## 2020-10-04 DIAGNOSIS — Z803 Family history of malignant neoplasm of breast: Secondary | ICD-10-CM | POA: Insufficient documentation

## 2020-10-04 DIAGNOSIS — Z8042 Family history of malignant neoplasm of prostate: Secondary | ICD-10-CM | POA: Insufficient documentation

## 2020-10-04 DIAGNOSIS — E785 Hyperlipidemia, unspecified: Secondary | ICD-10-CM | POA: Insufficient documentation

## 2020-10-04 LAB — CBC WITH DIFFERENTIAL/PLATELET
Abs Immature Granulocytes: 0.01 10*3/uL (ref 0.00–0.07)
Basophils Absolute: 0 10*3/uL (ref 0.0–0.1)
Basophils Relative: 1 %
Eosinophils Absolute: 0.1 10*3/uL (ref 0.0–0.5)
Eosinophils Relative: 3 %
HCT: 40.6 % (ref 39.0–52.0)
Hemoglobin: 13.1 g/dL (ref 13.0–17.0)
Immature Granulocytes: 0 %
Lymphocytes Relative: 44 %
Lymphs Abs: 2 10*3/uL (ref 0.7–4.0)
MCH: 22.7 pg — ABNORMAL LOW (ref 26.0–34.0)
MCHC: 32.3 g/dL (ref 30.0–36.0)
MCV: 70.4 fL — ABNORMAL LOW (ref 80.0–100.0)
Monocytes Absolute: 0.5 10*3/uL (ref 0.1–1.0)
Monocytes Relative: 10 %
Neutro Abs: 1.9 10*3/uL (ref 1.7–7.7)
Neutrophils Relative %: 42 %
Platelets: 309 10*3/uL (ref 150–400)
RBC: 5.77 MIL/uL (ref 4.22–5.81)
RDW: 17.2 % — ABNORMAL HIGH (ref 11.5–15.5)
WBC: 4.6 10*3/uL (ref 4.0–10.5)
nRBC: 0 % (ref 0.0–0.2)

## 2020-10-04 LAB — COMPREHENSIVE METABOLIC PANEL
ALT: 12 U/L (ref 0–44)
AST: 18 U/L (ref 15–41)
Albumin: 4.1 g/dL (ref 3.5–5.0)
Alkaline Phosphatase: 62 U/L (ref 38–126)
Anion gap: 6 (ref 5–15)
BUN: 13 mg/dL (ref 8–23)
CO2: 31 mmol/L (ref 22–32)
Calcium: 9 mg/dL (ref 8.9–10.3)
Chloride: 104 mmol/L (ref 98–111)
Creatinine, Ser: 1.03 mg/dL (ref 0.61–1.24)
GFR, Estimated: >60 mL/min (ref >60)
Glucose, Bld: 97 mg/dL (ref 70–99)
Potassium: 4.8 mmol/L (ref 3.5–5.1)
Sodium: 141 mmol/L (ref 135–145)
Total Bilirubin: 0.4 mg/dL (ref 0.3–1.2)
Total Protein: 7 g/dL (ref 6.5–8.1)
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
## 2020-10-06 ENCOUNTER — Telehealth: Payer: Self-pay | Admitting: Internal Medicine

## 2020-10-06 NOTE — Telephone Encounter (Signed)
Pt is scheduled for an appt to address his hernia on 10/15/2020

## 2020-10-06 NOTE — Telephone Encounter (Signed)
Pt called to let Dr. Roxan Hockey that the hernia has gotten larger and now he can feel it and knows its there/ Pt states he will be in office for flu shot at 8:30 and wanted to know if he can see Dr. Roxan Hockey to be advised on what to do/ if not he'll schedule an appt at that time / please advise

## 2020-10-08 ENCOUNTER — Ambulatory Visit (INDEPENDENT_AMBULATORY_CARE_PROVIDER_SITE_OTHER): Payer: Medicare HMO

## 2020-10-08 ENCOUNTER — Other Ambulatory Visit: Payer: Self-pay

## 2020-10-08 DIAGNOSIS — Z23 Encounter for immunization: Secondary | ICD-10-CM

## 2020-10-15 ENCOUNTER — Ambulatory Visit: Payer: Medicare HMO | Admitting: Family Medicine

## 2020-10-19 NOTE — Progress Notes (Signed)
Patient ID: Jerry Pena, male    DOB: 22-Nov-1951, 69 y.o.   MRN: [id number]  PCP: Towanda Malkin, MD  Chief Complaint  Patient presents with  . Hernia    Subjective:   Jerry Pena is a 69 y.o. male, presents to clinic with CC of the following:  Chief Complaint  Patient presents with  . Hernia    HPI:  Patient is a 69 year old male Last visit with me was 08/09/2020 with concerns for hernia noted during that visit. Follows up today  Last visit he noted concerns for a lump in the groin area that he noticed when showering the week prior, was not painful and was in the right suprapubic area. He denied any urinary symptoms of concern with no dysuria, no blood in the urine, no increased frequency, still leaking which is not new for him.   Still has some blood in the stool, all that was after his radiation, and has had no significant changes in that in the recent past. An ultrasound obtained after that visit was as follows:  FINDINGS: Targeted ultrasound of the right groin region is performed in the region of lump. Corresponding to the lung is an echogenic mass that augments with Valsalva. Approximate fascial defect measuring 1.9 cm.  IMPRESSION: Corresponding to the right suprapubic/groin lump appears to be a fat containing hernia. There is no bowel containing hernia identified.  Communication to him after this ultrasound result was obtained was as follows:  That is relatively good news, and at this point I would recommend just monitoring. If it is increasing over time, becoming painful, or other more concerns are arising, should follow-up to discuss potentially having him seen by a general surgeon to discuss potential repair of this fascial defect.  He follows up today notes he is concerned that he thinks the hernia has gotten larger.  Notes when he coughs, sneezes, passes gas, or bears down with a bowel movement, it feels like it is expanding, and over time, he  feels like it has gotten bigger.  Denies marked pain, and no pains felt into the testicle area.  It remains a prominence in the right suprapubic area.  Since his last visit, he followed up with urology 09/07/2020 with the following assessment/plan noted:  Assessment & Plan:    1. Prostate cancer (Jerry Pena) pT3a N1 with persistent PSA of 0.2s/p RALP with BPNLD 11/2017  Stagingvia axman PET negative following procedure S/p whole pelvic radiation Has refused further ADT Currently NED PSA remains undetectable   2. Stress incontinence of urine Discussed surgical intervention for incontinence with [facility]. Francesca Jewett [facility]  Patient is not interested in surgical intervention at this time.   3. Family history of malignant neoplasm Previously referred to genetics, did not persue  4. Erectile dysfunction after radical prostatectomy  He failed PDE 5 inhibitors. Previously tried intercavernousal injections which was titrated up to 1 mL sequentially without success.  Patient is not interested in a penile prosthesis.  Start super tri-mix at 5 mcg and titrate up as needed.  Follow up in 1 year with PSA. (will ask primary to check PSA at 6 mo interval)   He also followed up with Dr. Tasia Catchings in October, with the assessment plan noted as follows:  ASSESSMENT & PLAN:  1. Prostate cancer (Jerry Pena)   2. Beta thalassemia trait    #Chronic microcytosis, due to beta thalassemia trait.   Hemoglobin remained stable at 13.  Recommend patient to continue oral folic  acid supplementation given the slight increase of turnover of her blood counts.  No need for other intervention.  #History of prostate cancer,  Currently monitored between radiation oncology and urology.  PSA has been monitored and has been stable. Should he progress this in the future, be happy to evaluate him for treatments if needed.  No need to follow-up with me at this point.  Patient will be discharged from our clinic.  All questions were  answered. The patient knows to call the clinic with any problems questions or concerns.   For review, he has a history of prostate cancer, completed salvage radiation therapy after biochemical failure after his robotic assisted prostatectomy in December 2018 for Gleason 7 adenocarcinoma of the prostate. Has continued to follow with urology and radiation oncology, with his last visit with radiation oncology in February 2021.  At that time he was noted to be doing well, and a follow-up in 1 year planned with a PSA at that time.  It was noted he does still deal with incontinence, did go to pelvic floor PT in his past.  Recent Labs       Lab Results  Component Value Date   PSA1 <0.1 08/12/2019   PSA1 <0.1 02/06/2019   PSA1 <0.1 11/13/2018   PSA 4.4 (H) 09/03/2017    He had a PSA 01/22/2020 that was less than 0.1  Patient Active Problem List   Diagnosis Date Noted  . Chronic constipation 10/14/2019  . Rectal bleeding 10/14/2019  . Radiation proctitis   . Migraine 01/23/2019  . Cataract 01/23/2019  . Beta thalassemia trait 09/27/2018  . Neutropenia ([place]) 09/23/2018  . History of hormone therapy 06/07/2018  . Prostate cancer ([place]) 12/10/2017  . Facet hypertrophy of lumbar region 06/13/2017  . DDD (degenerative disc disease), lumbar 06/13/2017  . Hyperlipidemia 09/09/2015  . Sciatica 09/09/2015  . Arthritis, lumbar spine 09/09/2015  . Tobacco abuse 09/09/2015  . Personal history of colonic polyps 09/09/2015      Current Outpatient Medications:  .  acetaminophen (TYLENOL) 500 MG tablet, Take 1,000 mg by mouth every 6 (six) hours as needed for moderate pain or headache. , Disp: , Rfl:  .  calcium-vitamin D (OSCAL WITH D) 500-200 MG-UNIT tablet, Take 1 tablet by mouth., Disp: , Rfl:  .  folic acid (FOLVITE) 818 MCG tablet, Take 1 tablet (400 mcg total) by mouth daily., Disp: 90 tablet, Rfl: 4 .  ibuprofen (ADVIL,MOTRIN) 200 MG tablet, Take 400 mg by mouth every 6 (six) hours as  needed for headache or moderate pain., Disp: , Rfl:  .  Melatonin 5 MG TABS, Take 5 mg by mouth daily as needed., Disp: , Rfl:  .  Multiple Vitamin (MULTIVITAMIN) tablet, Take 1 tablet by mouth daily., Disp: , Rfl:  .  NONFORMULARY OR COMPOUNDED ITEM, Super Trimix (30/2/20)-(Pap/Phent/PGE)  Dosage: Inject 0.5 cc per injection Titrate up to 1cc as needed   Vial 15ml  Qty #10 Refills 6  [facility] [phone] Fax [phone], Disp: 10 each, Rfl: 6 .  polyethylene glycol (MIRALAX / GLYCOLAX) 17 g packet, Take 17 g by mouth daily., Disp: , Rfl:  .  vitamin B-12 (CYANOCOBALAMIN) 100 MCG tablet, Take 100 mcg by mouth daily., Disp: , Rfl:  .  vitamin C (ASCORBIC ACID) 500 MG tablet, Take 500 mg by mouth daily., Disp: , Rfl:    Allergies  Allergen Reactions  . Aspirin Other (See Comments)    Upset stomach, indigestion and heart burn  .  Penicillins Hives and Other (See Comments)    Has patient had a PCN reaction causing immediate rash, facial/tongue/throat swelling, SOB or lightheadedness with hypotension: Yes Has patient had a PCN reaction causing severe rash involving mucus membranes or skin necrosis: No Has patient had a PCN reaction that required hospitalization: Yes Has patient had a PCN reaction occurring within the last 10 years: No If all of the above answers are "NO", then may proceed with Cephalosporin use.      Past Surgical History:  Procedure Laterality Date  . CATARACT EXTRACTION W/ INTRAOCULAR LENS IMPLANT Left 3/ 28/2014   eyes  . CATARACT EXTRACTION W/ INTRAOCULAR LENS IMPLANT Right 04/23/2013  . COLONOSCOPY WITH PROPOFOL N/A 07/29/2019   Procedure: COLONOSCOPY WITH PROPOFOL;  Surgeon: Lin Landsman, MD;  Location[facility] ENDOSCOPY;  Service: Gastroenterology;  Laterality: N/A;  . FLEXIBLE SIGMOIDOSCOPY N/A 11/14/2019   Procedure: FLEXIBLE SIGMOIDOSCOPY;  Surgeon: Lin Landsman, MD;  Location: [facility] ENDOSCOPY;  Service: Gastroenterology;  Laterality: N/A;  .  PELVIC LYMPH NODE DISSECTION N/A 12/10/2017   Procedure: PELVIC LYMPH NODE DISSECTION;  Surgeon: [name], MD;  Location: [facility] ORS;  Service: Urology;  Laterality: N/A;  . PROSTATE SURGERY    . ROBOT ASSISTED LAPAROSCOPIC RADICAL PROSTATECTOMY N/A 12/10/2017   Procedure: ROBOTIC ASSISTED LAPAROSCOPIC RADICAL PROSTATECTOMY;  Surgeon: [name], MD;  Location: [facility] ORS;  Service: Urology;  Laterality: N/A;     Family History  Problem Relation Age of Onset  . Stroke Mother   . Stroke Father   . Cancer Sister        breast, lung  . Pulmonary Hypertension Sister   . Cancer Son        prostate  . Prostate cancer Son   . Hypertension Sister   . Bladder Cancer Neg Hx   . Kidney cancer Neg Hx      Social History   Tobacco Use  . Smoking status: Current Some Day Smoker    Years: 48.00    Types: Cigars  . Smokeless tobacco: Never Used  . Tobacco comment: occasional cigar  Substance Use Topics  . Alcohol use: Yes    Alcohol/week: 0.0 standard drinks    Comment: beer rarely    With staff assistance, above reviewed with the patient today.  ROS: As per HPI, otherwise no specific complaints on a limited and focused system review   No results found for this or any previous visit (from the past 72 hour(s)).   PHQ2/9: Depression screen [facility] 2/9 10/21/2020 08/09/2020 06/29/2020 06/22/2020 12/15/2019  Decreased Interest 0 0 0 0 1  Down, Depressed, Hopeless 0 0 1 1 1   PHQ - 2 Score 0 0 1 1 2   Altered sleeping - 1 0 0 0  Tired, decreased energy - 1 0 0 0  Change in appetite - 0 0 0 0  Feeling bad or failure about yourself  - 0 0 0 0  Trouble concentrating - 0 0 0 0  Moving slowly or fidgety/restless - 0 0 0 0  Suicidal thoughts - 0 0 0 0  PHQ-9 Score - 2 1 1 2   Difficult doing work/chores - Not difficult at all Not difficult at all Not difficult at all Not difficult at all  Some recent data might be hidden   PHQ-2/9 Result is neg  Fall Risk: Fall Risk  10/21/2020  08/09/2020 06/29/2020 06/22/2020 12/15/2019  Falls in the past year? 0 0 0 0 0  Number falls in past yr: 0  0 0 0 0  Injury with Fall? 0 0 0 0 0  Risk for fall due to : - - No Fall Risks - -  Risk for fall due to: Comment - - - - -  Follow up - - Falls prevention discussed - Falls evaluation completed      Objective:   Vitals:   10/21/20 0825  BP: 100/60  Pulse: 83  Resp: 16  Temp: 97.9 F (36.6 C)  TempSrc: Oral  SpO2: 96%  Weight: 141 lb 4.8 oz (64.1 kg)  Height: 5\' 8"  (1.727 m)    Body mass index is 21.48 kg/m.  Physical Exam   NAD, masked, pleasant HEENT - [place]/AT, sclera anicteric,  Abd - soft, NT diffusely,  GU-a raised area persists in the right suprapubic region, lessened some when supine and relaxed.  With a partial sit up attempt, the raised area increased, was not tender to palpate, and he denied any pain throughout the assessment.  There was no overlying erythema, no inguinal adenopathy noted, No marked gap noted when testing for inguinal hernia with cough, and not painful with this test. Neuro/psychiatric - affect was not flat, appropriate with conversation             Alert, with speech normal   Results for orders placed or performed in visit on 10/04/20  Comprehensive metabolic panel  Result Value Ref Range   Sodium 141 135 - 145 mmol/L   Potassium 4.8 3.5 - 5.1 mmol/L   Chloride 104 98 - 111 mmol/L   CO2 31 22 - 32 mmol/L   Glucose, Bld 97 70 - 99 mg/dL   BUN 13 8 - 23 mg/dL   Creatinine, Ser 1.03 0.61 - 1.24 mg/dL   Calcium 9.0 8.9 - 10.3 mg/dL   Total Protein 7.0 6.5 - 8.1 g/dL   Albumin 4.1 3.5 - 5.0 g/dL   AST 18 15 - 41 U/L   ALT 12 0 - 44 U/L   Alkaline Phosphatase 62 38 - 126 U/L   Total Bilirubin 0.4 0.3 - 1.2 mg/dL   GFR, Estimated >60 >60 mL/min   Anion gap 6 5 - 15  CBC with Differential/Platelet  Result Value Ref Range   WBC 4.6 4.0 - 10.5 K/uL   RBC 5.77 4.22 - 5.81 MIL/uL   Hemoglobin 13.1 13.0 - 17.0 g/dL   HCT 40.6 39 - 52 %    MCV 70.4 (L) 80.0 - 100.0 fL   MCH 22.7 (L) 26.0 - 34.0 pg   MCHC 32.3 30.0 - 36.0 g/dL   RDW 17.2 (H) 11.5 - 15.5 %   Platelets 309 150 - 400 K/uL   nRBC 0.0 0.0 - 0.2 %   Neutrophils Relative % 42 %   Neutro Abs 1.9 1.7 - 7.7 K/uL   Lymphocytes Relative 44 %   Lymphs Abs 2.0 0.7 - 4.0 K/uL   Monocytes Relative 10 %   Monocytes Absolute 0.5 0.1 - 1.0 K/uL   Eosinophils Relative 3 %   Eosinophils Absolute 0.1 0.0 - 0.5 K/uL   Basophils Relative 1 %   Basophils Absolute 0.0 0.0 - 0.1 K/uL   Immature Granulocytes 0 %   Abs Immature Granulocytes 0.01 0.00 - 0.07 K/uL       Assessment & Plan:    1. Suprapubic fullness, not painful 2. Non-recurrent unilateral hernia without obstruction or gangrene  Discussed the ultrasound result with the patient and the hernia diagnosed.  Noted if not causing pain,  sometimes it is not unreasonable to continue to follow, and it is common with Vasalva maneuvers and coughing and sneezing that it does become more prominent. He was very concerned that it is gotten larger, and when discussed options of a general surgery referral and potentially having it repaired, he did want to pursue that route. Did note to him again today that if he develops more sharp and severe pains, the importance of being seen emergently at that time in an ER setting, and he was understanding of that.  A general surgery referral was placed. He will keep his planned follow-up in December, and can follow-up sooner as needed.      Towanda Malkin, MD 10/21/20 9:01 AM

## 2020-10-21 ENCOUNTER — Other Ambulatory Visit: Payer: Self-pay

## 2020-10-21 ENCOUNTER — Ambulatory Visit (INDEPENDENT_AMBULATORY_CARE_PROVIDER_SITE_OTHER): Payer: Medicare HMO | Admitting: Internal Medicine

## 2020-10-21 ENCOUNTER — Encounter: Payer: Self-pay | Admitting: Internal Medicine

## 2020-10-21 VITALS — BP 100/60 | HR 83 | Temp 97.9°F | Resp 16 | Ht 68.0 in | Wt 141.3 lb

## 2020-10-21 DIAGNOSIS — K4090 Unilateral inguinal hernia, without obstruction or gangrene, not specified as recurrent: Secondary | ICD-10-CM | POA: Insufficient documentation

## 2020-10-21 DIAGNOSIS — R198 Other specified symptoms and signs involving the digestive system and abdomen: Secondary | ICD-10-CM

## 2020-10-21 NOTE — Patient Instructions (Signed)
Referral was placed today to general surgery.

## 2020-10-28 ENCOUNTER — Other Ambulatory Visit: Payer: Self-pay

## 2020-10-28 ENCOUNTER — Telehealth: Payer: Self-pay | Admitting: Surgery

## 2020-10-28 ENCOUNTER — Encounter: Payer: Self-pay | Admitting: Surgery

## 2020-10-28 ENCOUNTER — Ambulatory Visit (INDEPENDENT_AMBULATORY_CARE_PROVIDER_SITE_OTHER): Payer: Medicare HMO | Admitting: Surgery

## 2020-10-28 VITALS — BP 120/81 | HR 94 | Temp 97.9°F | Ht 68.0 in | Wt 135.0 lb

## 2020-10-28 DIAGNOSIS — K4090 Unilateral inguinal hernia, without obstruction or gangrene, not specified as recurrent: Secondary | ICD-10-CM

## 2020-10-28 NOTE — Telephone Encounter (Signed)
Patient has been advised of Pre-Admission date/time, COVID Testing date and Surgery date.  Surgery Date: 12/01/20 Preadmission Testing Date: 11/24/20 (phone 8a-1p) Covid Testing Date: 11/29/20 - patient advised to go to the [facility] ([place]) between 8a-1p   Patient has been made aware to call [phone], between 1-3:00pm the day before surgery, to find out what time to arrive for surgery.

## 2020-10-28 NOTE — Patient Instructions (Signed)
You have chose to have your hernia repaired. This will be done by Dr. Christian Mate at [facility].  Please see your (blue) Pre-care information that you have been given today. Our surgery scheduler will call you to go over surgery dates and information.   You will need to arrange to be out of work for 2 weeks and then return with a lifting restrictions for 4 more weeks. Please send any FMLA paperwork prior to surgery and we will fill this out and fax it back to your employer within 3 business days.  You may have a bruise in your groin and also swelling and brusing in your testicle area. You may use ice 4-5 times daily for 15-20 minutes each time. Make sure that you place a barrier between you and the ice pack. To decrease the swelling, you may roll up a bath towel and place it vertically in between your thighs with your testicles resting on the towel. You will want to keep this area elevated as much as possible for several days following surgery.    Inguinal Hernia, Adult Muscles help keep everything in the body in its proper place. But if a weak spot in the muscles develops, something can poke through. That is called a hernia. When this happens in the lower part of the belly (abdomen), it is called an inguinal hernia. (It takes its name from a part of the body in this region called the inguinal canal.) A weak spot in the wall of muscles lets some fat or part of the small intestine bulge through. An inguinal hernia can develop at any age. Men get them more often than women. CAUSES  In adults, an inguinal hernia develops over time.  It can be triggered by:  Suddenly straining the muscles of the lower abdomen.  Lifting heavy objects.  Straining to have a bowel movement. Difficult bowel movements (constipation) can lead to this.  Constant coughing. This may be caused by smoking or lung disease.  Being overweight.  Being pregnant.  Working at a job that requires long periods of standing or heavy  lifting.  Having had an inguinal hernia before. One type can be an emergency situation. It is called a strangulated inguinal hernia. It develops if part of the small intestine slips through the weak spot and cannot get back into the abdomen. The blood supply can be cut off. If that happens, part of the intestine may die. This situation requires emergency surgery. SYMPTOMS  Often, a small inguinal hernia has no symptoms. It is found when a healthcare provider does a physical exam. Larger hernias usually have symptoms.   In adults, symptoms may include:  A lump in the groin. This is easier to see when the person is standing. It might disappear when lying down.  In men, a lump in the scrotum.  Pain or burning in the groin. This occurs especially when lifting, straining or coughing.  A dull ache or feeling of pressure in the groin.  Signs of a strangulated hernia can include:  A bulge in the groin that becomes very painful and tender to the touch.  A bulge that turns red or purple.  Fever, nausea and vomiting.  Inability to have a bowel movement or to pass gas. DIAGNOSIS  To decide if you have an inguinal hernia, a healthcare provider will probably do a physical examination.  This will include asking questions about any symptoms you have noticed.  The healthcare provider might feel the groin area and ask  you to cough. If an inguinal hernia is felt, the healthcare provider may try to slide it back into the abdomen.  Usually no other tests are needed. TREATMENT  Treatments can vary. The size of the hernia makes a difference. Options include:  Watchful waiting. This is often suggested if the hernia is small and you have had no symptoms.  No medical procedure will be done unless symptoms develop.  You will need to watch closely for symptoms. If any occur, contact your healthcare provider right away.  Surgery. This is used if the hernia is larger or you have symptoms.  Open  surgery. This is usually an outpatient procedure (you will not stay overnight in a hospital). An cut (incision) is made through the skin in the groin. The hernia is put back inside the abdomen. The weak area in the muscles is then repaired by herniorrhaphy or hernioplasty. Herniorrhaphy: in this type of surgery, the weak muscles are sewn back together. Hernioplasty: a patch or mesh is used to close the weak area in the abdominal wall.  Laparoscopy. In this procedure, a surgeon makes small incisions. A thin tube with a tiny video camera (called a laparoscope) is put into the abdomen. The surgeon repairs the hernia with mesh by looking with the video camera and using two long instruments. HOME CARE INSTRUCTIONS   After surgery to repair an inguinal hernia:  You will need to take pain medicine prescribed by your healthcare provider. Follow all directions carefully.  You will need to take care of the wound from the incision.  Your activity will be restricted for awhile. This will probably include no heavy lifting for several weeks. You also should not do anything too active for a few weeks. When you can return to work will depend on the type of job that you have.  During "watchful waiting" periods, you should:  Maintain a healthy weight.  Eat a diet high in fiber (fruits, vegetables and whole grains).  Drink plenty of fluids to avoid constipation. This means drinking enough water and other liquids to keep your urine clear or pale yellow.  Do not lift heavy objects.  Do not stand for long periods of time.  Quit smoking. This should keep you from developing a frequent cough. SEEK MEDICAL CARE IF:   A bulge develops in your groin area.  You feel pain, a burning sensation or pressure in the groin. This might be worse if you are lifting or straining.  You develop a fever of more than 100.5 F (38.1 C). SEEK IMMEDIATE MEDICAL CARE IF:   Pain in the groin increases suddenly.  A bulge in  the groin gets bigger suddenly and does not go down.  For men, there is sudden pain in the scrotum. Or, the size of the scrotum increases.  A bulge in the groin area becomes red or purple and is painful to touch.  You have nausea or vomiting that does not go away.  You feel your heart beating much faster than normal.  You cannot have a bowel movement or pass gas.  You develop a fever of more than 102.0 F (38.9 C).   This information is not intended to replace advice given to you by your health care provider. Make sure you discuss any questions you have with your health care provider.   Document Released: 04/29/2009 Document Revised: 03/04/2012 Document Reviewed: 06/14/2015 Elsevier Interactive Patient Education [employer].

## 2020-10-29 ENCOUNTER — Ambulatory Visit: Payer: Self-pay | Admitting: Surgery

## 2020-10-29 DIAGNOSIS — K4090 Unilateral inguinal hernia, without obstruction or gangrene, not specified as recurrent: Secondary | ICD-10-CM

## 2020-10-29 NOTE — Progress Notes (Signed)
Patient ID: Jerry Pena, male   DOB: 03/27/1951, 69 y.o.   MRN: [id number]  Chief Complaint: Right inguinal hernia  History of Present Illness Jerry Pena is a 69 y.o. male with 2 to 32-month history of the presence of a bulge in the right groin.  He denies pain, but notes a progressive presence.  It increases in size with lifting.  He reports a history of constipation which it seems to exacerbate it.  He had a prostatectomy done robotically and has some leaking, but no challenges with voiding.  History of prostate cancer.  Concerned about potential incarceration.  Past Medical History Past Medical History:  Diagnosis Date  . Beta thalassemia trait 09/27/2018  . Cancer ([place]) 10/2017   Prostate  . DDD (degenerative disc disease), lumbar 06/13/2017   Lumbar imaging June 2018  . Elevated PSA 09/12/2017   Refer to urologist, Sept 2018  . Facet hypertrophy of lumbar region 06/13/2017   Lumbar imaging June 2018  . Hyperlipidemia       Past Surgical History:  Procedure Laterality Date  . CATARACT EXTRACTION W/ INTRAOCULAR LENS IMPLANT Left 3/ 28/2014   eyes  . CATARACT EXTRACTION W/ INTRAOCULAR LENS IMPLANT Right 04/23/2013  . COLONOSCOPY WITH PROPOFOL N/A 07/29/2019   Procedure: COLONOSCOPY WITH PROPOFOL;  Surgeon: Lin Landsman, MD;  Location: [facility] ENDOSCOPY;  Service: Gastroenterology;  Laterality: N/A;  . FLEXIBLE SIGMOIDOSCOPY N/A 11/14/2019   Procedure: FLEXIBLE SIGMOIDOSCOPY;  Surgeon: Lin Landsman, MD;  Location: [facility] ENDOSCOPY;  Service: Gastroenterology;  Laterality: N/A;  . PELVIC LYMPH NODE DISSECTION N/A 12/10/2017   Procedure: PELVIC LYMPH NODE DISSECTION;  Surgeon: [name], MD;  Location: [facility] ORS;  Service: Urology;  Laterality: N/A;  . PROSTATE SURGERY    . ROBOT ASSISTED LAPAROSCOPIC RADICAL PROSTATECTOMY N/A 12/10/2017   Procedure: ROBOTIC ASSISTED LAPAROSCOPIC RADICAL PROSTATECTOMY;  Surgeon: [name], MD;  Location: [facility] ORS;  Service: Urology;   Laterality: N/A;    Allergies  Allergen Reactions  . Aspirin Other (See Comments)    Upset stomach, indigestion and heart burn  . Penicillins Hives and Other (See Comments)    Has patient had a PCN reaction causing immediate rash, facial/tongue/throat swelling, SOB or lightheadedness with hypotension: Yes Has patient had a PCN reaction causing severe rash involving mucus membranes or skin necrosis: No Has patient had a PCN reaction that required hospitalization: Yes Has patient had a PCN reaction occurring within the last 10 years: No If all of the above answers are "NO", then may proceed with Cephalosporin use.     Current Outpatient Medications  Medication Sig Dispense Refill  . acetaminophen (TYLENOL) 500 MG tablet Take 1,000 mg by mouth every 6 (six) hours as needed for moderate pain or headache.     . calcium-vitamin D (OSCAL WITH D) 500-200 MG-UNIT tablet Take 1 tablet by mouth.    . docusate sodium (COLACE) 100 MG capsule Take 100 mg by mouth 2 (two) times daily.    . folic acid (FOLVITE) 765 MCG tablet Take 1 tablet (400 mcg total) by mouth daily. 90 tablet 4  . ibuprofen (ADVIL,MOTRIN) 200 MG tablet Take 400 mg by mouth every 6 (six) hours as needed for headache or moderate pain.    . Melatonin 5 MG TABS Take 5 mg by mouth daily as needed.    . Multiple Vitamin (MULTIVITAMIN) tablet Take 1 tablet by mouth daily.    . NONFORMULARY OR COMPOUNDED ITEM Super Trimix (30/2/20)-(Pap/Phent/PGE)  Dosage: Inject 0.5 cc per injection  Titrate up to 1cc as needed   Vial 11ml  Qty #10 [place] [phone] Fax [phone] 10 each 6  . polyethylene glycol (MIRALAX / GLYCOLAX) 17 g packet Take 17 g by mouth daily.    . vitamin B-12 (CYANOCOBALAMIN) 100 MCG tablet Take 100 mcg by mouth daily.    . vitamin C (ASCORBIC ACID) 500 MG tablet Take 500 mg by mouth daily.     No current facility-administered medications for this visit.    Family History Family  History  Problem Relation Age of Onset  . Stroke Mother   . Stroke Father   . Cancer Sister        breast, lung  . Pulmonary Hypertension Sister   . Cancer Son        prostate  . Prostate cancer Son   . Hypertension Sister   . Bladder Cancer Neg Hx   . Kidney cancer Neg Hx       Social History Social History   Tobacco Use  . Smoking status: Current Some Day Smoker    Years: 48.00    Types: Cigars  . Smokeless tobacco: Never Used  . Tobacco comment: occasional cigar  Vaping Use  . Vaping Use: Never used  Substance Use Topics  . Alcohol use: Yes    Alcohol/week: 0.0 standard drinks    Comment: beer rarely  . Drug use: No    Comment: used marijuana more than 10 years ago        Review of Systems  Constitutional: Negative.   HENT: Negative.   Eyes: Negative.   Respiratory: Negative.   Cardiovascular: Negative.   Gastrointestinal: Positive for constipation.  Genitourinary: Negative.   Skin: Negative.   Neurological: Negative.   Psychiatric/Behavioral: Negative.       Physical Exam Blood pressure 120/81, pulse 94, temperature 97.9 F (36.6 C), height 5\' 8"  (1.727 m), weight 135 lb (61.2 kg), SpO2 97 %. Last Weight  Most recent update: 10/28/2020 10:54 AM   Weight  61.2 kg (135 lb)            CONSTITUTIONAL: Well developed, and nourished, appropriately responsive and aware without distress.  Thin male.  EYES: Sclera non-icteric.   EARS, NOSE, MOUTH AND THROAT: Mask worn.     Hearing is intact to voice.  NECK: Trachea is midline, and there is no jugular venous distension.  LYMPH NODES:  Lymph nodes in the neck are not enlarged. RESPIRATORY:  Lungs are clear, and breath sounds are equal bilaterally. Normal respiratory effort without pathologic use of accessory muscles. CARDIOVASCULAR: Heart is regular in rate and rhythm. GI: The abdomen is  soft, nontender, and nondistended.  Laparoscopic scars well-healed, without appreciable fascial defects or masses  I did  not appreciate hepatosplenomegaly. There were normal bowel sounds. GU: Right inguinal hernia, no appreciable hernia on the left.  Testes are somewhat atrophic and descended. MUSCULOSKELETAL:  Symmetrical muscle tone appreciated in all four extremities.    SKIN: Skin turgor is normal. No pathologic skin lesions appreciated.  NEUROLOGIC:  Motor and sensation appear grossly normal.  Cranial nerves are grossly without defect. PSYCH:  Alert and oriented to person, place and time. Affect is appropriate for situation.  Data Reviewed I have personally reviewed what is currently available of the patient's imaging, recent labs and medical records.   Labs:  CBC Latest Ref Rng & Units 10/04/2020 06/22/2020 10/08/2019  WBC 4.0 - 10.5 K/uL 4.6 7.4 4.2  Hemoglobin 13.0 -  17.0 g/dL 13.1 12.8(L) 12.0(L)  Hematocrit 39 - 52 % 40.6 40.3 38.3(L)  Platelets 150 - 400 K/uL 309 348 282   CMP Latest Ref Rng & Units 10/04/2020 06/22/2020 10/08/2019  Glucose 70 - 99 mg/dL 97 86 98  BUN 8 - 23 mg/dL 13 17 16   Creatinine 0.61 - 1.24 mg/dL 1.03 1.02 0.88  Sodium 135 - 145 mmol/L 141 141 140  Potassium 3.5 - 5.1 mmol/L 4.8 4.8 4.4  Chloride 98 - 111 mmol/L 104 104 105  CO2 22 - 32 mmol/L 31 29 28   Calcium 8.9 - 10.3 mg/dL 9.0 9.8 9.2  Total Protein 6.5 - 8.1 g/dL 7.0 7.1 7.0  Total Bilirubin 0.3 - 1.2 mg/dL 0.4 0.5 0.4  Alkaline Phos 38 - 126 U/L 62 - 84  AST 15 - 41 U/L 18 17 16   ALT 0 - 44 U/L 12 10 12       Imaging:  Within last 24 hrs: No results found.  Assessment    Right inguinal hernia, history of robotic prostatectomy. Patient Active Problem List   Diagnosis Date Noted  . Non-recurrent unilateral inguinal hernia without obstruction or gangrene 10/21/2020  . Chronic constipation 10/14/2019  . Rectal bleeding 10/14/2019  . Radiation proctitis   . Migraine 01/23/2019  . Cataract 01/23/2019  . Beta thalassemia trait 09/27/2018  . Neutropenia ([place]) 09/23/2018  . History of hormone therapy  06/07/2018  . Prostate cancer ([place]) 12/10/2017  . Facet hypertrophy of lumbar region 06/13/2017  . DDD (degenerative disc disease), lumbar 06/13/2017  . Hyperlipidemia 09/09/2015  . Sciatica 09/09/2015  . Arthritis, lumbar spine 09/09/2015  . Tobacco abuse 09/09/2015  . Personal history of colonic polyps 09/09/2015    Plan    Robotic right inguinal hernia repair with mesh. He is having very little pain, I explained to him it will be difficult to reproduce that exact degree of pain looseness postoperatively, especially in light of the fact that he has additional scarring from his prostatectomy surgery that we will have to be compensated for. I discussed possibility of incarceration, strangulation, enlargement in size over time, and the risk of emergency surgery in the face of strangulation.  He is well aware this would be elective surgery, however he does not wish to defer it for continued observation.  Also discussed the risk of surgery including recurrence which can be up to 30% in the case of complex hernias, use of prosthetic materials (mesh) and the increased risk of infxn, post-op infxn and the possible need for re-operation and removal of mesh if used, possibility of post-op SBO or ileus, and the risks of general anesthetic including MI, CVA, sudden death or even reaction to anesthetic medications. The patient and family understands the risks, any and all questions were answered to the patient's satisfaction.  Face-to-face time spent with the patient and accompanying care providers(if present) was 30 minutes, with more than 50% of the time spent counseling, educating, and coordinating care of the patient.      Ronny Bacon M.D., FACS 10/29/2020, 9:33 AM

## 2020-11-24 ENCOUNTER — Other Ambulatory Visit
Admission: RE | Admit: 2020-11-24 | Discharge: 2020-11-24 | Disposition: A | Discharge status: Home / Self Care | Payer: Medicare HMO | Source: Ambulatory Visit | Attending: Surgery | Admitting: Surgery

## 2020-11-24 ENCOUNTER — Ambulatory Visit: Payer: Medicare HMO | Admitting: Urology

## 2020-11-26 ENCOUNTER — Encounter: Payer: Self-pay | Admitting: Urology

## 2020-11-26 ENCOUNTER — Ambulatory Visit: Payer: Medicare HMO | Admitting: Urology

## 2020-11-29 ENCOUNTER — Telehealth: Payer: Self-pay | Admitting: Surgery

## 2020-11-29 ENCOUNTER — Other Ambulatory Visit
Admission: RE | Admit: 2020-11-29 | Discharge: 2020-11-29 | Disposition: A | Discharge status: Home / Self Care | Payer: Medicare HMO | Source: Ambulatory Visit | Attending: Surgery | Admitting: Surgery

## 2020-11-29 ENCOUNTER — Other Ambulatory Visit: Admission: RE | Admit: 2020-11-29 | Payer: Medicare HMO | Source: Ambulatory Visit

## 2020-11-29 NOTE — Pre-Procedure Instructions (Signed)
Attempted to reach patient via phone to remind him of his scheduled covid testing and blood work procedure.  Unable to leave a message as there was no voicemail;.

## 2020-11-29 NOTE — Telephone Encounter (Signed)
Patient is cancelling surgery for 12/01/20.  Does not wish to reschedule at all.  States he has other health issues and having a lot of blood in his urine.  Wants to focus on other health issues at this time.  If he decides to reschedule, he will need follow up with Dr. Christian Mate first.  Surgery is cancelled at patient's request.

## 2020-12-01 ENCOUNTER — Encounter: Admission: RE | Payer: Self-pay | Source: Home / Self Care

## 2020-12-01 ENCOUNTER — Ambulatory Visit: Admission: RE | Admit: 2020-12-01 | Payer: Medicare HMO | Source: Home / Self Care | Admitting: Surgery

## 2020-12-01 SURGERY — REPAIR, HERNIA, INGUINAL, ROBOT-ASSISTED, LAPAROSCOPIC, USING MESH
Anesthesia: General | Laterality: Right

## 2020-12-09 NOTE — Progress Notes (Signed)
Patient ID: Jerry Pena, male    DOB: Mar 02, 1951, 69 y.o.   MRN: [id number]  PCP: Towanda Malkin, MD  No chief complaint on file.   Subjective:   Aiman Noe is a 69 y.o. male, presents to clinic with CC of the following:  No chief complaint on file.   HPI:  Patient is a 69 y.o. male patient of Raelyn Ensign. Follows up today as planned after our visit 06/22/2020 Follow-up communication from labs after that visit was as follows:  His complete metabolic panel was entirely normal. The lipid panel remains good. The complete blood count also remains stable with no concerning changes.  He was scheduled to undergo right inguinal hernia repair with mesh on 12/01/2020.  He did not have that done as decided to cancel with blood in the urine concern recent past.  All in all, he notes that he has been feeling pretty good, except in September, he noticed some blood in the urine.  It happened 1 time, and then he was doing well until 3 days after Thanksgiving when it happened again.  He noted passing some clots a couple times, and then improved, although he notes it has recurred several times since, almost happening every few days with some blood noted more at the end of urination, sometimes clots, and often painful when that happens.  No burning with urination, no fevers, no flank pains.  Denies any suprapubic discomfort.  He notes the urine is often clear after these bleeding episodes occur.  He still has some leakage concerns after the treatments for the prostate cancer.  He noted last visit some dental issues with a couple teeth chipped and falling out that he felt was due to the radiation.  He has yet to schedule with a dentist, although plans to do so after the first of the new year and plans on getting dentures  Hyperlipidemia Med regimen - none Lab Results  Component Value Date   CHOL 165 06/22/2020   HDL 71 06/22/2020   LDLCALC 78 06/22/2020   TRIG 77 06/22/2020   CHOLHDL  2.3 06/22/2020    Had mildly elevated triglyceridesin 2019 - recheck in June 2020 showed normal levels.  Tries to eat healthy - Occasionally eats fried foods, Eats vegetables.   Prostate cancer He completed salvage radiation therapy to his prostate after biochemical failure after his robotic assisted prostatectomy on 11/2017 for Gleason 7 adenocarcinoma the prostate. Follows up with Dr. Erlene Quan (urology) & Dr.Chrystal (radiationoncology).  His main complaint after was leaking urine, pretty routinely, and has been prevalent after the radiation for his prostate cancer, and still persists.  Last visit with rad oncology early in 2021 had the following plan noted: PLAN:Present time patient is doing well he will continue taking MiraLAX on daily basis and will revisit with GI should his bleeding persist or worsen.  I will see him back in 1 year with a PSA at that time. Patient knows to call with any concerns.  Last visit with urology was 09/07/20 and the PSA was undetectable noted on 09/02/2020 with the following assessment/plan noted  Assessment & Plan:   1. Prostate cancer ([place]) pT3a N1 with persistent PSA of 0.2s/p RALP with BPNLD 11/2017  Stagingvia axman PET negative following procedure S/p whole pelvic radiation Has refused further ADT Currently NED PSA remains undetectable   2. Stress incontinence of urine Discussed surgical intervention for incontinence with Dr. Francesca Jewett at [facility].  Patient is not interested in surgical intervention  at this time.   3. Family history of malignant neoplasm Previously referred to genetics, did not persue  4. Erectile dysfunction after radical prostatectomy  He failed PDE 5 inhibitors. Previously tried intercavernousal injections which was titrated up to 1 mL sequentially without success.  Patient is not interested in a penile prosthesis.  Start super tri-mix at 5 mcg and titrate up as needed.  Follow up in 1 year with PSA. (will ask primary  to check PSA at 6 mo interval)  Last visit with oncology was 10/11/21had the following A/P  ASSESSMENT & PLAN:  1. Prostate cancer ([place])   2. Beta thalassemia trait    #Chronic microcytosis, due to beta thalassemia trait.   Hemoglobin remained stable at 13.  Recommend patient to continue oral folic acid supplementation given the slight increase of turnover of her blood counts.  No need for other intervention.  #History of prostate cancer,  Currently monitored between radiation oncology and urology.  PSA has been monitored and has been stable. Should he progress this in the future, be happy to evaluate him for treatments if needed.  No need to follow-up with me at this point.  Patient will be discharged from our clinic.  All questions were answered. The patient knows to call the clinic with any problems questions or concerns.   Lab Results  Component Value Date   PSA1 <0.1 09/02/2020   PSA1 <0.1 08/12/2019   PSA1 <0.1 02/06/2019   PSA 4.4 (H) 09/03/2017     He does still deal with incontinence - went to pelvic floor PT,    Degenerartive disc disease Takes calcium and vitamin D supplements daily, takes acetaminophen or ibuprofen as needed for pain, and that does help keep this fairly well controlled.  The sciatica is down his right leg, denies any foot drop or leg weakness concerns when occurs. He improved after going to PT and learning certain exercises, still has intermittent sciatic nerve pain, although notes has been very stable recently.  Neutropeniaand Beta Thallasemia/Anemia Follows up with Dr. Tasia Catchings for microcytic anemia and neutropenia, next visit in Oct 2021 Last CBC was 10/08/2019. Noted to have beta thallasemia trait and suggested to avoid iron supplementationandtaking folic acid 400mg  daily.  He has been compliant with this regimen - no concerns.       Lab Results  Component Value Date   WBC 4.2 10/08/2019   HGB 12.0 (L) 10/08/2019   HCT 38.3 (L)  10/08/2019   MCV 72.0 (L) 10/08/2019   PLT 282 10/08/2019    Rectal Bleeding hx: He had scope with GI (Dr. Marius Ditch) 10/2019. Notes still has some intermittent BRBPR with BM's, when not take the miralax is more problematic. Last time had BRBPR several weeks ago Had cauterization procedure prior and painful after with BM, not want to go thru that again. Still taking miralax, no issues with constipation now - BM's are normal. No abdominal pain, no black or dark stools  Tobacco Use: Smoking a cigar about twice a week; no cigarette use.   Colon CA screen - Last colonoscopy - 07/29/2019 and results reviewed     Patient Active Problem List   Diagnosis Date Noted  . Non-recurrent unilateral inguinal hernia without obstruction or gangrene 10/21/2020  . Chronic constipation 10/14/2019  . Rectal bleeding 10/14/2019  . Radiation proctitis   . Migraine 01/23/2019  . Cataract 01/23/2019  . Beta thalassemia trait 09/27/2018  . Neutropenia ([place]) 09/23/2018  . History of hormone therapy 06/07/2018  . Prostate  cancer ([place]) 12/10/2017  . Facet hypertrophy of lumbar region 06/13/2017  . DDD (degenerative disc disease), lumbar 06/13/2017  . Hyperlipidemia 09/09/2015  . Sciatica 09/09/2015  . Arthritis, lumbar spine 09/09/2015  . Tobacco abuse 09/09/2015  . Personal history of colonic polyps 09/09/2015      Current Outpatient Medications:  .  acetaminophen (TYLENOL) 500 MG tablet, Take 1,000 mg by mouth every 6 (six) hours as needed for moderate pain or headache. , Disp: , Rfl:  .  calcium-vitamin D (OSCAL WITH D) 500-200 MG-UNIT tablet, Take 1 tablet by mouth., Disp: , Rfl:  .  docusate sodium (COLACE) 100 MG capsule, Take 100 mg by mouth 2 (two) times daily., Disp: , Rfl:  .  folic acid (FOLVITE) 416 MCG tablet, Take 1 tablet (400 mcg total) by mouth daily., Disp: 90 tablet, Rfl: 4 .  ibuprofen (ADVIL,MOTRIN) 200 MG tablet, Take 400 mg by mouth every 6 (six) hours as needed for headache  or moderate pain., Disp: , Rfl:  .  Melatonin 5 MG TABS, Take 5 mg by mouth daily as needed., Disp: , Rfl:  .  Multiple Vitamin (MULTIVITAMIN) tablet, Take 1 tablet by mouth daily., Disp: , Rfl:  .  NONFORMULARY OR COMPOUNDED ITEM, Super Trimix (30/2/20)-(Pap/Phent/PGE)  Dosage: Inject 0.5 cc per injection Titrate up to 1cc as needed   Vial 35ml  Qty #10 Refills 6  [place] [phone] Fax [phone], Disp: 10 each, Rfl: 6 .  polyethylene glycol (MIRALAX / GLYCOLAX) 17 g packet, Take 17 g by mouth daily., Disp: , Rfl:  .  vitamin B-12 (CYANOCOBALAMIN) 100 MCG tablet, Take 100 mcg by mouth daily., Disp: , Rfl:  .  vitamin C (ASCORBIC ACID) 500 MG tablet, Take 500 mg by mouth daily., Disp: , Rfl:    Allergies  Allergen Reactions  . Aspirin Other (See Comments)    Upset stomach, indigestion and heart burn  . Penicillins Hives and Other (See Comments)    Has patient had a PCN reaction causing immediate rash, facial/tongue/throat swelling, SOB or lightheadedness with hypotension: Yes Has patient had a PCN reaction causing severe rash involving mucus membranes or skin necrosis: No Has patient had a PCN reaction that required hospitalization: Yes Has patient had a PCN reaction occurring within the last 10 years: No If all of the above answers are "NO", then may proceed with Cephalosporin use.      Past Surgical History:  Procedure Laterality Date  . CATARACT EXTRACTION W/ INTRAOCULAR LENS IMPLANT Left 3/ 28/2014   eyes  . CATARACT EXTRACTION W/ INTRAOCULAR LENS IMPLANT Right 04/23/2013  . COLONOSCOPY WITH PROPOFOL N/A 07/29/2019   Procedure: COLONOSCOPY WITH PROPOFOL;  Surgeon: Lin Landsman, MD;  Location[facility] ENDOSCOPY;  Service: Gastroenterology;  Laterality: N/A;  . FLEXIBLE SIGMOIDOSCOPY N/A 11/14/2019   Procedure: FLEXIBLE SIGMOIDOSCOPY;  Surgeon: Lin Landsman, MD;  Location[facility] ENDOSCOPY;  Service: Gastroenterology;  Laterality: N/A;  . PELVIC LYMPH NODE  DISSECTION N/A 12/10/2017   Procedure: PELVIC LYMPH NODE DISSECTION;  Surgeon: [name], MD;  Location: [facility] ORS;  Service: Urology;  Laterality: N/A;  . PROSTATE SURGERY    . ROBOT ASSISTED LAPAROSCOPIC RADICAL PROSTATECTOMY N/A 12/10/2017   Procedure: ROBOTIC ASSISTED LAPAROSCOPIC RADICAL PROSTATECTOMY;  Surgeon: [name], MD;  Location: [facility] ORS;  Service: Urology;  Laterality: N/A;     Family History  Problem Relation Age of Onset  . Stroke Mother   . Stroke Father   . Cancer Sister  breast, lung  . Pulmonary Hypertension Sister   . Cancer Son        prostate  . Prostate cancer Son   . Hypertension Sister   . Bladder Cancer Neg Hx   . Kidney cancer Neg Hx      Social History   Tobacco Use  . Smoking status: Current Some Day Smoker    Years: 48.00    Types: Cigars  . Smokeless tobacco: Never Used  . Tobacco comment: occasional cigar  Substance Use Topics  . Alcohol use: Yes    Alcohol/week: 0.0 standard drinks    Comment: beer rarely    With staff assistance, above reviewed with the patient today.  ROS: As per HPI, otherwise no specific complaints on a limited and focused system review   Results for orders placed or performed in visit on 12/14/20 (from the past 72 hour(s))  POCT Urinalysis Dipstick     Status: Abnormal   Collection Time: 12/14/20  8:07 AM  Result Value Ref Range   Color, UA Yellow    Clarity, UA Clear    Glucose, UA Negative Negative   Bilirubin, UA Negative    Ketones, UA Negative    Spec Grav, UA 1.015 1.010 - 1.025   Blood, UA Large    pH, UA 6.5 5.0 - 8.0   Protein, UA Negative Negative   Urobilinogen, UA 0.2 0.2 or 1.0 E.U./dL   Nitrite, UA Negative    Leukocytes, UA Negative Negative   Appearance     Odor       PHQ2/9: Depression screen [facility] 2/9 12/14/2020 10/21/2020 08/09/2020 06/29/2020 06/22/2020  Decreased Interest 0 0 0 0 0  Down, Depressed, Hopeless 0 0 0 1 1  PHQ - 2 Score 0 0 0 1 1  Altered sleeping -  - 1 0 0  Tired, decreased energy - - 1 0 0  Change in appetite - - 0 0 0  Feeling bad or failure about yourself  - - 0 0 0  Trouble concentrating - - 0 0 0  Moving slowly or fidgety/restless - - 0 0 0  Suicidal thoughts - - 0 0 0  PHQ-9 Score - - 2 1 1   Difficult doing work/chores - - Not difficult at all Not difficult at all Not difficult at all  Some recent data might be hidden   PHQ-2/9 Result reviewed  Fall Risk: Fall Risk  12/14/2020 10/28/2020 10/21/2020 08/09/2020 06/29/2020  Falls in the past year? 0 0 0 0 0  Number falls in past yr: 0 0 0 0 0  Injury with Fall? 0 0 0 0 0  Risk for fall due to : - - - - No Fall Risks  Risk for fall due to: Comment - - - - -  Follow up - - - - Falls prevention discussed      Objective:   Vitals:   12/14/20 0759  BP: 130/70  Pulse: 76  Resp: 16  Temp: 98 F (36.7 C)  TempSrc: Oral  SpO2: 99%  Weight: 140 lb (63.5 kg)  Height: 5\' 8"  (1.727 m)    Body mass index is 21.29 kg/m.  Physical Exam   NAD, masked, pleasant, not ill-appearing HEENT - [place]/AT, sclera anicteric, positive arcus, PERRL, conj - non-inj'ed, poor dentition, pharynx clear Neck - supple, no adenopathy,  carotids 2+ and = without bruits bilat Car - RRR without m/g/r Pulm- RR and effort normal at rest, CTA without wheeze or rales Abd -  soft, NT diffusely, nontender palpating the suprapubic region, ND,   Back - no CVA tenderness GU-no concerning lesions noted, no discharge, no inguinal adenopathy, the raised area persists in the right suprapubic region that was not tender to palpate, not significantly increased in size. Ext - no LE edema,  Neuro/psychiatric - affect was not flat, appropriate with conversation             Alert with normal speech             Grossly non-focal - good strength on testing extremities, sensation intact to LT in distal extremities              Results for orders placed or performed in visit on 12/14/20  POCT Urinalysis Dipstick  Result  Value Ref Range   Color, UA Yellow    Clarity, UA Clear    Glucose, UA Negative Negative   Bilirubin, UA Negative    Ketones, UA Negative    Spec Grav, UA 1.015 1.010 - 1.025   Blood, UA Large    pH, UA 6.5 5.0 - 8.0   Protein, UA Negative Negative   Urobilinogen, UA 0.2 0.2 or 1.0 E.U./dL   Nitrite, UA Negative    Leukocytes, UA Negative Negative   Appearance     Odor     A urine dip obtained today had large blood, and was otherwise negative    Assessment & Plan:      1.  Hematuria with some clots concerns at times, known history of prostate cancer ([place]) Continues to follow with urology and radiation oncology, with recent PSAs good. Concerned with the increased bleeding in the urine noted, especially the clots intermittently, as concern for potential obstructive issues in the near future increasing. Do not feel an infection concern, with his urine dip otherwise negative except for the blood. Do feel he needs to follow-up with urology, and have asked Crystal to call the urologist office to help get him a follow-up appointment.  Noted to him if he ever has increased clots and the inability to urinate, that is a medical emergency, and he needs to proceed to the emergency room immediately for management.  He was understanding of that. He is still having urinary incontinence intermittently, has continued to follow with urology and was not interested in potential surgical intervention noted on that last visit with urology..  2. Beta thalassemia trait 3. Microcytic anemia Remains on the folic acid and had been followed by Dr. Tasia Catchings,  no more follow-up recommended after his last visit noted above.Marland Kitchen CBCs have remained stable  4. Mixed hyperlipidemia Last lipid panel remains good, with slightly high triglycerides noted in the past. Can recheck a lipid panel again with his next labs on follow-up  5. DDD (degenerative disc disease), lumbar with some sciatica intermittently Continues to  manage with intermittent acetaminophen/ibuprofen products as needed, and did have PT in the past to help. will follow-up again worsens.  6. BRBPR (bright red blood per rectum) Still has bleeding per rectum occasionally, felt to be secondary to the radiation treatment for his prostate cancer.  He did see GI, did have a procedure done with cauterization, although noted bowel movements after were very painful, and would like to not proceed with that again. Notes that MiraLAX has been very helpful to manage with the bleeding lessening in the recent past, and to continue using MiraLAX. Should follow-up if symptoms increasing, and he is aware if he starts to have increased bleeding acutely,  he needs to be seen more emergently in an emergency setting.  7. Tobacco abuse Continues with cigar use and quit cigarette use a long time ago. Encouraged to try to lessen tobacco again today.  8.  Inguinal hernia Was scheduled for a repair as noted above, although he wanted to delay having that done until he has the bleeding in the urine from the penis assessed and better managed.  The urology office was called today to help schedule a follow-up appointment for him in the very near future. will hold off on checking further labs today, and will schedule a follow-up again in 6 months time, sooner as needed.  Recommended to come to that visit fasting to recheck labs again at that time He is aware that the follow-up will be with a new provider as I will be leaving the practice prior to that planned follow-up      Towanda Malkin, MD 12/14/20 8:21 AM

## 2020-12-14 ENCOUNTER — Encounter: Payer: Self-pay | Admitting: Urology

## 2020-12-14 ENCOUNTER — Encounter: Payer: Self-pay | Admitting: Internal Medicine

## 2020-12-14 ENCOUNTER — Other Ambulatory Visit: Payer: Self-pay

## 2020-12-14 ENCOUNTER — Ambulatory Visit (INDEPENDENT_AMBULATORY_CARE_PROVIDER_SITE_OTHER): Payer: Medicare HMO | Admitting: Urology

## 2020-12-14 ENCOUNTER — Ambulatory Visit (INDEPENDENT_AMBULATORY_CARE_PROVIDER_SITE_OTHER): Payer: Medicare HMO | Admitting: Internal Medicine

## 2020-12-14 VITALS — BP 137/85 | HR 73 | Ht 68.0 in | Wt 140.0 lb

## 2020-12-14 VITALS — BP 130/70 | HR 76 | Temp 98.0°F | Resp 16 | Ht 68.0 in | Wt 140.0 lb

## 2020-12-14 DIAGNOSIS — R310 Gross hematuria: Secondary | ICD-10-CM

## 2020-12-14 DIAGNOSIS — K4090 Unilateral inguinal hernia, without obstruction or gangrene, not specified as recurrent: Secondary | ICD-10-CM

## 2020-12-14 DIAGNOSIS — E782 Mixed hyperlipidemia: Secondary | ICD-10-CM

## 2020-12-14 DIAGNOSIS — D509 Iron deficiency anemia, unspecified: Secondary | ICD-10-CM | POA: Diagnosis not present

## 2020-12-14 DIAGNOSIS — Z87448 Personal history of other diseases of urinary system: Secondary | ICD-10-CM | POA: Diagnosis not present

## 2020-12-14 DIAGNOSIS — M5431 Sciatica, right side: Secondary | ICD-10-CM

## 2020-12-14 DIAGNOSIS — M5136 Other intervertebral disc degeneration, lumbar region: Secondary | ICD-10-CM

## 2020-12-14 DIAGNOSIS — K625 Hemorrhage of anus and rectum: Secondary | ICD-10-CM | POA: Diagnosis not present

## 2020-12-14 DIAGNOSIS — Z720 Tobacco use: Secondary | ICD-10-CM

## 2020-12-14 DIAGNOSIS — C61 Malignant neoplasm of prostate: Secondary | ICD-10-CM

## 2020-12-14 DIAGNOSIS — D563 Thalassemia minor: Secondary | ICD-10-CM | POA: Diagnosis not present

## 2020-12-14 LAB — POCT URINALYSIS DIPSTICK
Bilirubin, UA: NEGATIVE
Glucose, UA: NEGATIVE
Ketones, UA: NEGATIVE
Leukocytes, UA: NEGATIVE
Nitrite, UA: NEGATIVE
Protein, UA: NEGATIVE
Spec Grav, UA: 1.015 (ref 1.010–1.025)
Urobilinogen, UA: 0.2 E.U./dL
pH, UA: 6.5 (ref 5.0–8.0)

## 2020-12-14 LAB — BLADDER SCAN AMB NON-IMAGING: Scan Result: 0

## 2020-12-14 MED ORDER — SULFAMETHOXAZOLE-TRIMETHOPRIM 800-160 MG PO TABS: 1.0000 | ORAL_TABLET | Freq: Two times a day (BID) | ORAL | 0 refills | Status: DC

## 2020-12-14 NOTE — Progress Notes (Signed)
12/14/2020 11:34 AM   Arma Heading [birth year] [id number]  Referring provider: Towanda Malkin, MD [address] [place],  [place] [address]  Chief Complaint  Patient presents with  . Hematuria   Urological history 1. Prostate cancer -  status post salvage radiation therapy to his prostate status post robotic prostatectomy for Gleason 7 (4+3) adenocarcinoma the prostate. - PSA <0.1 ng/mL on 09/02/2020  HPI: Jerry Pena is a 69 y.o. male who expressed his concerns to Dr. Magdalen Spatz regarding gross hematuria, passage of clots and dysuria during his visit with him this morning and we were asked if we could evaluate him for this today.  Mr. Glogowski states that at the end of September he had 1 episode of gross hematuria.  It happened on one occasion during the entire stream and for the rest of the day the urine was clear.  He started to experience terminal dysuria just before Thanksgiving and then 3 days after Thanksgiving, he experienced bright red urine with clots on 2 occasions and then the urine cleared.  Then 2 days later after that, he experienced dull red urine with passage of clots.  On Mar 29, 2023 he had passed very small clots, but his urine is clear.  The terminal dysuria has persisted since just before Thanksgiving.  His UA today is positive for greater than 30 RBCs with some granular casts.  His PVR is 0 mL  He was scheduled on December 8 for a right hernia repair, but he has canceled that due to to his issues with hematuria and clots.  He continues to experience stress urinary incontinence since his prostatectomy.  He is going through 8 bladder guards daily.  This is not changed since he is experienced the hematuria, clots and dysuria.   PMH: Past Medical History:  Diagnosis Date  . Beta thalassemia trait 09/27/2018  . Cancer ([place]) 10/2017   Prostate  . DDD (degenerative disc disease), lumbar 06/13/2017   Lumbar imaging June 2018  . Elevated PSA 09/12/2017    Refer to urologist, Sept 2018  . Facet hypertrophy of lumbar region 06/13/2017   Lumbar imaging June 2018  . Hyperlipidemia     Surgical History: Past Surgical History:  Procedure Laterality Date  . CATARACT EXTRACTION W/ INTRAOCULAR LENS IMPLANT Left 3/ 28/2014   eyes  . CATARACT EXTRACTION W/ INTRAOCULAR LENS IMPLANT Right 04/23/2013  . COLONOSCOPY WITH PROPOFOL N/A 07/29/2019   Procedure: COLONOSCOPY WITH PROPOFOL;  Surgeon: Lin Landsman, MD;  Location[facility] ENDOSCOPY;  Service: Gastroenterology;  Laterality: N/A;  . FLEXIBLE SIGMOIDOSCOPY N/A 11/14/2019   Procedure: FLEXIBLE SIGMOIDOSCOPY;  Surgeon: Lin Landsman, MD;  Location: [facility] ENDOSCOPY;  Service: Gastroenterology;  Laterality: N/A;  . PELVIC LYMPH NODE DISSECTION N/A 12/10/2017   Procedure: PELVIC LYMPH NODE DISSECTION;  Surgeon: [name], MD;  Location: [facility] ORS;  Service: Urology;  Laterality: N/A;  . PROSTATE SURGERY    . ROBOT ASSISTED LAPAROSCOPIC RADICAL PROSTATECTOMY N/A 12/10/2017   Procedure: ROBOTIC ASSISTED LAPAROSCOPIC RADICAL PROSTATECTOMY;  Surgeon: [name], MD;  Location: [facility] ORS;  Service: Urology;  Laterality: N/A;    Home Medications:  Allergies as of 12/14/2020      Reactions   Aspirin Other (See Comments)   Upset stomach, indigestion and heart burn   Penicillins Hives, Other (See Comments)   Has patient had a PCN reaction causing immediate rash, facial/tongue/throat swelling, SOB or lightheadedness with hypotension: Yes Has patient had a PCN reaction causing severe rash involving mucus membranes  or skin necrosis: No Has patient had a PCN reaction that required hospitalization: Yes Has patient had a PCN reaction occurring within the last 10 years: No If all of the above answers are "NO", then may proceed with Cephalosporin use.      Medication List       Accurate as of December 14, 2020 11:59 PM. If you have any questions, ask your nurse or doctor.         acetaminophen 500 MG tablet Commonly known as: TYLENOL Take 1,000 mg by mouth every 6 (six) hours as needed for moderate pain or headache.   calcium-vitamin D 500-200 MG-UNIT tablet Commonly known as: OSCAL WITH D Take 1 tablet by mouth.   docusate sodium 100 MG capsule Commonly known as: COLACE Take 100 mg by mouth 2 (two) times daily.   folic acid 237 MCG tablet Commonly known as: FOLVITE Take 1 tablet (400 mcg total) by mouth daily.   ibuprofen 200 MG tablet Commonly known as: ADVIL Take 400 mg by mouth every 6 (six) hours as needed for headache or moderate pain.   melatonin 5 MG Tabs Take 5 mg by mouth daily as needed.   multivitamin tablet Take 1 tablet by mouth daily.   NONFORMULARY OR COMPOUNDED ITEM Super Trimix (30/2/20)-(Pap/Phent/PGE)  Dosage: Inject 0.5 cc per injection Titrate up to 1cc as needed   Vial 34ml  Qty #10 Refills 6  [place] [phone] Fax [phone]   polyethylene glycol 17 g packet Commonly known as: MIRALAX / GLYCOLAX Take 17 g by mouth daily.   sulfamethoxazole-trimethoprim 800-160 MG tablet Commonly known as: BACTRIM DS Take 1 tablet by mouth every 12 (twelve) hours. Started by: Zara Council, PA-C   vitamin B-12 100 MCG tablet Commonly known as: CYANOCOBALAMIN Take 100 mcg by mouth daily.   vitamin C 500 MG tablet Commonly known as: ASCORBIC ACID Take 500 mg by mouth daily.       Allergies:  Allergies  Allergen Reactions  . Aspirin Other (See Comments)    Upset stomach, indigestion and heart burn  . Penicillins Hives and Other (See Comments)    Has patient had a PCN reaction causing immediate rash, facial/tongue/throat swelling, SOB or lightheadedness with hypotension: Yes Has patient had a PCN reaction causing severe rash involving mucus membranes or skin necrosis: No Has patient had a PCN reaction that required hospitalization: Yes Has patient had a PCN reaction occurring within the last 10 years:  No If all of the above answers are "NO", then may proceed with Cephalosporin use.     Family History: Family History  Problem Relation Age of Onset  . Stroke Mother   . Stroke Father   . Cancer Sister        breast, lung  . Pulmonary Hypertension Sister   . Cancer Son        prostate  . Prostate cancer Son   . Hypertension Sister   . Bladder Cancer Neg Hx   . Kidney cancer Neg Hx     Social History:  reports that he has been smoking cigars. He has smoked for the past 48.00 years. He has never used smokeless tobacco. He reports current alcohol use. He reports that he does not use drugs.  ROS: Pertinent ROS in HPI  Physical Exam: BP 137/85   Pulse 73   Ht 5\' 8"  (1.727 m)   Wt 140 lb (63.5 kg)   BMI 21.29 kg/m   Constitutional:  Well nourished. Alert and  oriented, No acute distress. HEENT: [place] AT, mask in place.  Trachea midline Cardiovascular: No clubbing, cyanosis, or edema. Respiratory: Normal respiratory effort, no increased work of breathing. Neurologic: Grossly intact, no focal deficits, moving all 4 extremities. Psychiatric: Normal mood and affect.  Laboratory Data: Lab Results  Component Value Date   WBC 4.6 10/04/2020   HGB 12.9 (L) 12/14/2020   HCT 41.1 12/14/2020   MCV 70.4 (L) 10/04/2020   PLT 309 10/04/2020    Lab Results  Component Value Date   CREATININE 1.03 10/04/2020    Lab Results  Component Value Date   PSA 4.4 (H) 09/03/2017       Component Value Date/Time   CHOL 165 06/22/2020 0809   HDL 71 06/22/2020 0809   CHOLHDL 2.3 06/22/2020 0809   LDLCALC 78 06/22/2020 0809    Lab Results  Component Value Date   AST 18 10/04/2020   Lab Results  Component Value Date   ALT 12 10/04/2020    Urinalysis Component     Latest Ref Rng & Units 12/14/2020         9:16 AM  Specific Gravity, UA     1.005 - 1.030 1.025  pH, UA     5.0 - 7.5 7.0  Color, UA     Yellow Yellow  Appearance Ur     Clear Clear  Leukocytes,UA     Negative  Negative  Protein,UA     Negative/Trace Trace (A)  Glucose, UA     Negative Negative  Ketones, UA     Negative Negative  RBC, UA     Negative 2+ (A)  Bilirubin, UA     Negative Negative  Urobilinogen, Ur     0.2 - 1.0 mg/dL 0.2  Nitrite, UA     Negative Negative  Microscopic Examination      See below:   Component     Latest Ref Rng & Units 12/14/2020          WBC, UA     0 - 5 /hpf 0-5  RBC     0 - 2 /hpf >30 (A)  Epithelial Cells (non renal)     0 - 10 /hpf 0-10  Casts     None seen /lpf Present (A)  Cast Type     N/A Granular casts (A)  Bacteria, UA     None seen/Few None seen    I have reviewed the labs.   Pertinent Imaging: Results for ALBERTO, PINA (MRN [id number]) as of 12/14/2020 10:01  Ref. Range 12/14/2020 09:27  Scan Result Unknown 0     Assessment & Plan:    1. Gross hematuria - with terminal dysuria - UA positive for > 30 RBC's - will send for culture - start Septra DS, twice daily for seven days - We will adjust if necessary once urine culture and sensitivities have returned - If urine culture is positive, we will treat with appropriate antibiotic and then reassess to ensure the hematuria resolves - If urine culture is negative, we will need to pursue a high risk hematuria work-up with CT urogram and cystoscopy - I explained to Mr. Lingerfelt how the CT urogram is performed, he is not allergic to shellfish or iodine.  I also explained how the cystoscopy is performed in the office and what to expect after the procedure.  I stated the reason for these tests if the urine culture is negative to evaluate him for possible urological malignancies as he is a  smoker and has history of radiation therapy -He is agreeable to this plan   Return for Pending urine culture results .  These notes generated with voice recognition software. I apologize for typographical errors.  Zara Council, PA-C  [facility] [address]   [place], [place] [address] [phone]

## 2020-12-15 ENCOUNTER — Ambulatory Visit: Payer: Self-pay | Admitting: Urology

## 2020-12-15 LAB — URINALYSIS, COMPLETE
Bilirubin, UA: NEGATIVE
Glucose, UA: NEGATIVE
Ketones, UA: NEGATIVE
Leukocytes,UA: NEGATIVE
Nitrite, UA: NEGATIVE
Specific Gravity, UA: 1.025 (ref 1.005–1.030)
Urobilinogen, Ur: 0.2 mg/dL (ref 0.2–1.0)
pH, UA: 7 (ref 5.0–7.5)

## 2020-12-15 LAB — HEMOGLOBIN: Hemoglobin: 12.9 g/dL — ABNORMAL LOW (ref 13.0–17.7)

## 2020-12-15 LAB — MICROSCOPIC EXAMINATION
Bacteria, UA: NONE SEEN
RBC, Urine: >30 /hpf — AB (ref 0–2)

## 2020-12-15 LAB — HEMATOCRIT: Hematocrit: 41.1 % (ref 37.5–51.0)

## 2020-12-20 ENCOUNTER — Telehealth: Payer: Self-pay

## 2020-12-20 NOTE — Telephone Encounter (Signed)
Pt calls triage line to inquire about what rx was sent in for him and how many prescriptions were sent in. Advised pt that one rx was sent in on 12/14/20, Bactrim. Pt gave verbal understanding.

## 2020-12-21 LAB — CULTURE, URINE COMPREHENSIVE

## 2020-12-22 ENCOUNTER — Ambulatory Visit: Payer: Medicare HMO | Admitting: Internal Medicine

## 2020-12-22 ENCOUNTER — Telehealth: Payer: Self-pay | Admitting: Family Medicine

## 2020-12-22 MED ORDER — CIPROFLOXACIN HCL 250 MG PO TABS: 250.0000 mg | ORAL_TABLET | Freq: Two times a day (BID) | ORAL | 0 refills | Status: DC

## 2020-12-22 NOTE — Telephone Encounter (Signed)
-----   Message from [place], [place] sent at 12/22/2020  4:32 PM EST ----- Please contact him and let him know that the Bactrim he was prescribed is not expected to treat his infection per ucx results. Please send in Cipro 250mg  BID x7 days with plans for repeat UA upon completion to prove resolution of hematuria. ----- Message ----- From: Interface, [facility] Lab Results In Sent: 12/14/2020   4:36 PM EST To: 12/16/2020, PA-C

## 2020-12-22 NOTE — Telephone Encounter (Signed)
Patient notified and voiced understanding. Patient is going to call back to schedule a UA.

## 2021-01-04 NOTE — Telephone Encounter (Signed)
Patient called stating that he is still having dysuria, blood is resolved.

## 2021-01-06 NOTE — Progress Notes (Signed)
01/07/2021 8:47 PM   Jerry Pena 1951/07/27 [id number]  Referring provider: Towanda Malkin, MD [address] [place],  [place] [address]  Chief Complaint  Patient presents with  . Hematuria   Urological history 1. Prostate cancer - s/pradical robotic prostatectomy with bilateral lymph node dissection on 12/10/2017. A non nerve sparing approach was used on the right side with partial nerve sparing on the left. - Surgical pathology consistent with Gleason 4+3 prostate cancer since of extraprostatic extension but negative margins. In addition to this, 1 of 4 nodes from the right pelvic lymph nodes including external iliac and obturator positive, 0 of 2 on the left. [id number] Mx - Postop PSA 0.2. Referred to radiation oncology foradjvanttreatment (whole pelvic radiation). He received Lupron on 2/19 x 3 month depo buthas declined further injections due to severe hot flashes and intolerance of this medication.  - PSA <0.1 ng/mL on 09/02/2020  2. SUI - since prostatectomy - going through 8 bladder guards daily    HPI: Jerry Pena is a 70 y.o. male who is following up after experiencing gross hematuria secondary to urinary tract infection.  He presented on December 14, 2020 with a complaint of intermittent gross hematuria with the passage of clots associated with dysuria.  Urine culture was positive for Enterococcus faecalis that was pansensitive and he was treated with culture appropriate antibiotics.  He states that the hematuria did not completely resolve while he is on the antibiotic.  He continues to have urgent urination, dysuria, urge incontinence and gross hematuria.  Patient denies any modifying or aggravating factors.  Patient denies any chest pain, SOB, dizziness or suprapubic/flank pain.  Patient denies any fevers, chills, nausea or vomiting.   UA greater than 30 RBC's and yeast  PMH: Past Medical History:  Diagnosis Date  . Beta thalassemia trait  09/27/2018  . Cancer ([place]) 10/2017   Prostate  . DDD (degenerative disc disease), lumbar 06/13/2017   Lumbar imaging June 2018  . Elevated PSA 09/12/2017   Refer to urologist, Sept 2018  . Facet hypertrophy of lumbar region 06/13/2017   Lumbar imaging June 2018  . Hyperlipidemia     Surgical History: Past Surgical History:  Procedure Laterality Date  . CATARACT EXTRACTION W/ INTRAOCULAR LENS IMPLANT Left 3/ 28/2014   eyes  . CATARACT EXTRACTION W/ INTRAOCULAR LENS IMPLANT Right 04/23/2013  . COLONOSCOPY WITH PROPOFOL N/A 07/29/2019   Procedure: COLONOSCOPY WITH PROPOFOL;  Surgeon: Lin Landsman, MD;  Location[facility]COPY;  Service: Gastroenterology;  Laterality: N/A;  . FLEXIBLE SIGMOIDOSCOPY N/A 11/14/2019   Procedure: FLEXIBLE SIGMOIDOSCOPY;  Surgeon: Lin Landsman, MD;  Location[facility] ENDOSCOPY;  Service: Gastroenterology;  Laterality: N/A;  . PELVIC LYMPH NODE DISSECTION N/A 12/10/2017   Procedure: PELVIC LYMPH NODE DISSECTION;  Surgeon: [name], MD;  Location: [facility] ORS;  Service: Urology;  Laterality: N/A;  . PROSTATE SURGERY    . ROBOT ASSISTED LAPAROSCOPIC RADICAL PROSTATECTOMY N/A 12/10/2017   Procedure: ROBOTIC ASSISTED LAPAROSCOPIC RADICAL PROSTATECTOMY;  Surgeon: [name], MD;  Location: [facility] ORS;  Service: Urology;  Laterality: N/A;    Home Medications:  Allergies as of 01/07/2021      Reactions   Aspirin Other (See Comments)   Upset stomach, indigestion and heart burn   Penicillins Hives, Other (See Comments)   Has patient had a PCN reaction causing immediate rash, facial/tongue/throat swelling, SOB or lightheadedness with hypotension: Yes Has patient had a PCN reaction causing severe rash involving mucus membranes or skin  necrosis: No Has patient had a PCN reaction that required hospitalization: Yes Has patient had a PCN reaction occurring within the last 10 years: No If all of the above answers are "NO", then may proceed with Cephalosporin  use.      Medication List       Accurate as of January 07, 2021 11:59 PM. If you have any questions, ask your nurse or doctor.        STOP taking these medications   ciprofloxacin 250 MG tablet Commonly known as: Cipro Stopped by: Zemirah Krasinski, PA-C     TAKE these medications   acetaminophen 500 MG tablet Commonly known as: TYLENOL Take 1,000 mg by mouth every 6 (six) hours as needed for moderate pain or headache.   calcium-vitamin D 500-200 MG-UNIT tablet Commonly known as: OSCAL WITH D Take 1 tablet by mouth.   docusate sodium 100 MG capsule Commonly known as: COLACE Take 100 mg by mouth 2 (two) times daily.   fluconazole 100 MG tablet Commonly known as: DIFLUCAN Take 1 tablet (100 mg total) by mouth daily. X 7 days Started by: Zara Council, PA-C   folic acid [id number] MCG tablet Commonly known as: FOLVITE Take 1 tablet (400 mcg total) by mouth daily.   ibuprofen 200 MG tablet Commonly known as: ADVIL Take 400 mg by mouth every 6 (six) hours as needed for headache or moderate pain.   melatonin 5 MG Tabs Take 5 mg by mouth daily as needed.   multivitamin tablet Take 1 tablet by mouth daily.   NONFORMULARY OR COMPOUNDED ITEM Super Trimix (30/2/20)-(Pap/Phent/PGE)  Dosage: Inject 0.5 cc per injection Titrate up to 1cc as needed   Vial 46ml  Qty #10 Refills 6  [place] [phone] Fax [phone]   polyethylene glycol 17 g packet Commonly known as: MIRALAX / GLYCOLAX Take 17 g by mouth daily.   sulfamethoxazole-trimethoprim 800-160 MG tablet Commonly known as: BACTRIM DS Take 1 tablet by mouth every 12 (twelve) hours.   vitamin B-12 100 MCG tablet Commonly known as: CYANOCOBALAMIN Take 100 mcg by mouth daily.   vitamin C 500 MG tablet Commonly known as: ASCORBIC ACID Take 500 mg by mouth daily.       Allergies:  Allergies  Allergen Reactions  . Aspirin Other (See Comments)    Upset stomach, indigestion and heart burn  .  Penicillins Hives and Other (See Comments)    Has patient had a PCN reaction causing immediate rash, facial/tongue/throat swelling, SOB or lightheadedness with hypotension: Yes Has patient had a PCN reaction causing severe rash involving mucus membranes or skin necrosis: No Has patient had a PCN reaction that required hospitalization: Yes Has patient had a PCN reaction occurring within the last 10 years: No If all of the above answers are "NO", then may proceed with Cephalosporin use.     Family History: Family History  Problem Relation Age of Onset  . Stroke Mother   . Stroke Father   . Cancer Sister        breast, lung  . Pulmonary Hypertension Sister   . Cancer Son        prostate  . Prostate cancer Son   . Hypertension Sister   . Bladder Cancer Neg Hx   . Kidney cancer Neg Hx     Social History:  reports that he has been smoking cigars. He has smoked for the past 48.00 years. He has never used smokeless tobacco. He reports current alcohol use. He reports  that he does not use drugs.  ROS: Pertinent ROS in HPI  Physical Exam: BP (!) 143/86   Pulse 78   Ht 5\' 8"  (1.727 m)   Wt 145 lb (65.8 kg)   BMI 22.05 kg/m   Constitutional:  Well nourished. Alert and oriented, No acute distress. HEENT: [place] AT, mask in place.  Trachea midline Cardiovascular: No clubbing, cyanosis, or edema. Respiratory: Normal respiratory effort, no increased work of breathing. Neurologic: Grossly intact, no focal deficits, moving all 4 extremities. Psychiatric: Normal mood and affect.  Laboratory Data: Urinalysis Component     Latest Ref Rng & Units 01/07/2021          Specific Gravity, UA      CANCELED  pH, UA      CANCELED  Color, UA     Yellow Red (A)  Appearance Ur     Clear Cloudy (A)  Protein,UA      CANCELED  Glucose, UA      CANCELED  Ketones, UA      CANCELED  Microscopic Examination      See below:   Component     Latest Ref Rng & Units 01/07/2021          WBC, UA      0 - 5 /hpf 0-5  RBC     0 - 2 /hpf >30 (A)  Epithelial Cells (non renal)     0 - 10 /hpf 0-10  Casts     None seen /lpf Present (A)  Cast Type     N/A Granular casts (A)  Bacteria, UA     None seen/Few Few  Yeast, UA     None seen Present (A)  I have reviewed the labs.   Pertinent Imaging: Results for SALVADORE, VALVANO (MRN [id number]) as of 12/14/2020 10:01  Ref. Range 12/14/2020 09:27  Scan Result Unknown 0     Assessment & Plan:    1. Gross hematuria - with terminal dysuria - UA positive for > 30 RBC's and yeast - will send for culture - start Diflucan 100 mg daily for seven days - We will adjust if necessary once urine culture and sensitivities have returned - we will need to pursue a high risk hematuria work-up at this time with CT urogram and cystoscopy - I explained to Mr. Yandell how the CT urogram is performed, he is not allergic to shellfish or iodine.  I also explained how the cystoscopy is performed in the office and what to expect after the procedure.  I stated the reason for these tests if the urine culture is negative to evaluate him for possible urological malignancies as he is a smoker and has history of radiation therapy -He is agreeable to this plan   2. Prostate cancer - followed by Dr. Erlene Quan and Dr. Donella Stade  3. SUI - managed with incontinence pads   Return for CT Urogram report and cystoscopy with Dr. Erlene Quan .  These notes generated with voice recognition software. I apologize for typographical errors.  Zara Council, PA-C  [facility] [address]  [place], [place] [address] [phone]

## 2021-01-07 ENCOUNTER — Ambulatory Visit (INDEPENDENT_AMBULATORY_CARE_PROVIDER_SITE_OTHER): Payer: Medicare HMO | Admitting: Urology

## 2021-01-07 ENCOUNTER — Other Ambulatory Visit: Payer: Self-pay

## 2021-01-07 ENCOUNTER — Encounter: Payer: Self-pay | Admitting: Urology

## 2021-01-07 VITALS — BP 143/86 | HR 78 | Ht 68.0 in | Wt 145.0 lb

## 2021-01-07 DIAGNOSIS — B3741 Candidal cystitis and urethritis: Secondary | ICD-10-CM

## 2021-01-07 DIAGNOSIS — R310 Gross hematuria: Secondary | ICD-10-CM | POA: Diagnosis not present

## 2021-01-07 DIAGNOSIS — C61 Malignant neoplasm of prostate: Secondary | ICD-10-CM | POA: Diagnosis not present

## 2021-01-07 LAB — URINALYSIS, COMPLETE

## 2021-01-07 LAB — MICROSCOPIC EXAMINATION: RBC, Urine: >30 /hpf — AB (ref 0–2)

## 2021-01-07 MED ORDER — FLUCONAZOLE 100 MG PO TABS
100.0000 mg | ORAL_TABLET | Freq: Every day | ORAL | 0 refills | Status: DC
Start: 2021-01-07 — End: 2021-01-18

## 2021-01-07 NOTE — Patient Instructions (Signed)
Cystoscopy Cystoscopy is a procedure that is used to help diagnose and sometimes treat conditions that affect the lower urinary tract. The lower urinary tract includes the bladder and the urethra. The urethra is the tube that drains urine from the bladder. Cystoscopy is done using a thin, tube-shaped instrument with a light and camera at the end (cystoscope). The cystoscope may be hard or flexible, depending on the goal of the procedure. The cystoscope is inserted through the urethra, into the bladder. Cystoscopy may be recommended if you have:  Urinary tract infections that keep coming back.  Blood in the urine (hematuria).  An inability to control when you urinate (urinary incontinence) or an overactive bladder.  Unusual cells found in a urine sample.  A blockage in the urethra, such as a urinary stone.  Painful urination.  An abnormality in the bladder found during an intravenous pyelogram (IVP) or CT scan. Cystoscopy may also be done to remove a sample of tissue to be examined under a microscope (biopsy). What are the risks? Generally, this is a safe procedure. However, problems may occur, including:  Infection.  Bleeding.  What happens during the procedure?  1. You will be given one or more of the following: ? A medicine to numb the area (local anesthetic). 2. The area around the opening of your urethra will be cleaned. 3. The cystoscope will be passed through your urethra into your bladder. 4. Germ-free (sterile) fluid will flow through the cystoscope to fill your bladder. The fluid will stretch your bladder so that your health care provider can clearly examine your bladder walls. 5. Your doctor will look at the urethra and bladder. 6. The cystoscope will be removed The procedure may vary among health care providers  What can I expect after the procedure? After the procedure, it is common to have: 1. Some soreness or pain in your abdomen and urethra. 2. Urinary symptoms.  These include: ? Mild pain or burning when you urinate. Pain should stop within a few minutes after you urinate. This may last for up to 1 week. ? A small amount of blood in your urine for several days. ? Feeling like you need to urinate but producing only a small amount of urine. Follow these instructions at home: General instructions  Return to your normal activities as told by your health care provider.   Do not drive for 24 hours if you were given a sedative during your procedure.  Watch for any blood in your urine. If the amount of blood in your urine increases, call your health care provider.  If a tissue sample was removed for testing (biopsy) during your procedure, it is up to you to get your test results. Ask your health care provider, or the department that is doing the test, when your results will be ready.  Drink enough fluid to keep your urine pale yellow.  Keep all follow-up visits as told by your health care provider. This is important. Contact a health care provider if you:  Have pain that gets worse or does not get better with medicine, especially pain when you urinate.  Have trouble urinating.  Have more blood in your urine. Get help right away if you:  Have blood clots in your urine.  Have abdominal pain.  Have a fever or chills.  Are unable to urinate. Summary  Cystoscopy is a procedure that is used to help diagnose and sometimes treat conditions that affect the lower urinary tract.  Cystoscopy is done using   a thin, tube-shaped instrument with a light and camera at the end.  After the procedure, it is common to have some soreness or pain in your abdomen and urethra.  Watch for any blood in your urine. If the amount of blood in your urine increases, call your health care provider.  If you were prescribed an antibiotic medicine, take it as told by your health care provider. Do not stop taking the antibiotic even if you start to feel better. This  information is not intended to replace advice given to you by your health care provider. Make sure you discuss any questions you have with your health care provider. Document Revised: 12/03/2018 Document Reviewed: 12/03/2018 Elsevier Patient Education  2020 [employer]   

## 2021-01-14 ENCOUNTER — Other Ambulatory Visit: Payer: Self-pay

## 2021-01-14 ENCOUNTER — Ambulatory Visit
Admission: RE | Admit: 2021-01-14 | Discharge: 2021-01-14 | Disposition: A | Discharge status: Home / Self Care | Payer: Medicare HMO | Source: Ambulatory Visit | Attending: Urology | Admitting: Urology

## 2021-01-14 DIAGNOSIS — R310 Gross hematuria: Secondary | ICD-10-CM | POA: Insufficient documentation

## 2021-01-14 DIAGNOSIS — N281 Cyst of kidney, acquired: Secondary | ICD-10-CM | POA: Diagnosis not present

## 2021-01-14 LAB — POCT I-STAT CREATININE: Creatinine, Ser: 1.1 mg/dL (ref 0.61–1.24)

## 2021-01-14 MED ORDER — IOHEXOL 300 MG/ML  SOLN
125.0000 mL | Freq: Once | INTRAMUSCULAR | Status: AC | PRN
  Administered 2021-01-14: 125 mL via INTRAVENOUS

## 2021-01-17 LAB — CULTURE, URINE COMPREHENSIVE

## 2021-01-18 ENCOUNTER — Encounter: Payer: Self-pay | Admitting: Urology

## 2021-01-18 ENCOUNTER — Other Ambulatory Visit: Payer: Self-pay

## 2021-01-18 ENCOUNTER — Ambulatory Visit (INDEPENDENT_AMBULATORY_CARE_PROVIDER_SITE_OTHER): Payer: Medicare HMO | Admitting: Urology

## 2021-01-18 VITALS — BP 107/98 | HR 98

## 2021-01-18 DIAGNOSIS — C61 Malignant neoplasm of prostate: Secondary | ICD-10-CM

## 2021-01-18 DIAGNOSIS — R310 Gross hematuria: Secondary | ICD-10-CM | POA: Diagnosis not present

## 2021-01-19 LAB — MICROSCOPIC EXAMINATION
Bacteria, UA: NONE SEEN
RBC, Urine: >30 /hpf — AB (ref 0–2)

## 2021-01-19 LAB — URINALYSIS, COMPLETE

## 2021-01-19 LAB — HEMOGLOBIN AND HEMATOCRIT, BLOOD
Hematocrit: 35.1 % — ABNORMAL LOW (ref 37.5–51.0)
Hemoglobin: 11 g/dL — ABNORMAL LOW (ref 13.0–17.7)

## 2021-01-19 LAB — CYTOLOGY - NON PAP

## 2021-01-20 ENCOUNTER — Telehealth: Payer: Self-pay | Admitting: *Deleted

## 2021-01-20 ENCOUNTER — Inpatient Hospital Stay: Payer: Medicare HMO | Attending: Oncology

## 2021-01-20 DIAGNOSIS — C61 Malignant neoplasm of prostate: Secondary | ICD-10-CM | POA: Diagnosis not present

## 2021-01-20 LAB — PSA: Prostatic Specific Antigen: <0.01 ng/mL (ref 0.00–4.00)

## 2021-01-20 NOTE — Telephone Encounter (Addendum)
Patient informed, voiced understanding.   ----- Message from [name], MD sent at 01/19/2021  1:32 PM EST ----- Hemoglobin is down slightly to 11 from 12.9 but this is okay.  If the bleeding picks up significantly, call and let us know.  [name], MD

## 2021-01-21 NOTE — Progress Notes (Signed)
   01/21/21  CC:  Chief Complaint  Patient presents with  . Cysto    HPI: 70 year old male with ongoing significant gross hematuria who presents today for cystoscopic evaluation.  Notably, he has a personal history of prostate cancer s/p prosatectomy pT3aN1 who underwent whole pelvic salvage radiation 2019.  His PSA remains undetectable.  See previous notes for details.  He did unfortunately have severe issues with radiation proctitis which have finally settled down.  He is now experiencing gross hematuria.  Blood pressure (!) 107/98, pulse 98. NED. A&Ox3.   No respiratory distress   Abd soft, NT, ND Normal phallus with bilateral descended testicles  Cystoscopy Procedure Note  Patient identification was confirmed, informed consent was obtained, and patient was prepped using Betadine solution.  Lidocaine jelly was administered per urethral meatus.     Pre-Procedure: - Inspection reveals a normal caliber ureteral meatus.  Procedure: The flexible cystoscope was introduced without difficulty - No urethral strictures/lesions are present. - Surgically absent prostate  - Normal bladder neck -Very difficult visualization today due to the presence of blood and one small clot in the bladder.  I was able to get a sense of the mucosa which showed no obvious papillary lesions but exam was insufficient to completely rule out underlying malignancy.   Post-Procedure: - Patient tolerated the procedure well  Assessment/ Plan:  1. Gross hematuria Fairly significant intermittent gross hematuria  Based on his history, strongly suspect radiation cystitis.  He is emptying his bladder at this point in time.  We will check hemoglobin.  We will have him return again in 2 weeks.  If his hematuria remains severe, will consider cystoscopy in the operating room with bladder biopsy.  If his hematuria has subsided, we will try to rescope him in the office.  He is agreeable this plan.  We discussed  warning symptoms extensively today.  We discussed the various degrees of hematuria and indications for more urgent/emergent evaluation.  If he does not fact have radiation cystitis refractory to fulguration, consider hyperbaric oxygen which was briefly discussed today as well as an alternative. - Urinalysis, Complete - Hemoglobin and hematocrit, blood; Future - Cytology, urine (performed at [facility]) - Hemoglobin and hematocrit, blood  2. Prostate cancer [facility]) Will need PSA recheck 02/2021    [name], MD

## 2021-01-27 ENCOUNTER — Encounter: Payer: Self-pay | Admitting: Radiation Oncology

## 2021-01-27 ENCOUNTER — Ambulatory Visit
Admission: RE | Admit: 2021-01-27 | Discharge: 2021-01-27 | Disposition: A | Discharge status: Home / Self Care | Payer: Medicare HMO | Source: Ambulatory Visit | Attending: Radiation Oncology | Admitting: Radiation Oncology

## 2021-01-27 VITALS — BP 163/92 | HR 123 | Temp 98.7°F | Resp 20 | Wt 136.2 lb

## 2021-01-27 DIAGNOSIS — C61 Malignant neoplasm of prostate: Secondary | ICD-10-CM | POA: Diagnosis not present

## 2021-01-27 DIAGNOSIS — R319 Hematuria, unspecified: Secondary | ICD-10-CM | POA: Diagnosis not present

## 2021-01-27 DIAGNOSIS — Z923 Personal history of irradiation: Secondary | ICD-10-CM | POA: Insufficient documentation

## 2021-01-27 DIAGNOSIS — Z08 Encounter for follow-up examination after completed treatment for malignant neoplasm: Secondary | ICD-10-CM | POA: Diagnosis not present

## 2021-01-27 NOTE — Progress Notes (Signed)
Radiation Oncology Follow up Note  Name: Jerry Pena   Date:   01/27/2021 MRN:  [id number] DOB: Jan 20, 1951    This 70 y.o. male presents to the clinic today for 2-1/2-year follow-up status post salvage radiation therapy to his prostate status post robotic prostatectomy for Gleason 7 (4+3) adenocarcinoma the prostate.  His initial tumor was a T3 aN1 M0  REFERRING PROVIDER: Hubbard Hartshorn, FNP  HPI: Patient is a 70 year old male now out 2-1/2 years having completed salvage radiation therapy status post biochemical failure status post robotic prostatectomy for Gleason 7 (4+3) adenocarcinoma.  Seen today in routine follow-up he is doing well.  Has developed some hematuria which is being worked up by Dr. Erlene Quan..  Initially had some radiation proctitis which has resolved.  He underwent a limited cystoscopy although visualization was difficult because of presence of blood and that is scheduled to be repeated.  His most recent PSA is less than 0.01.  Patient did have a CT scan which I have reviewed showed apparent thickening of the posterior bladder wall.  Patient is on chronic Advil  COMPLICATIONS OF TREATMENT: none  FOLLOW UP COMPLIANCE: keeps appointments   PHYSICAL EXAM:  BP (!) 163/92   Pulse (!) 123   Temp 98.7 F (37.1 C)   Resp 20   Wt 136 lb 3.2 oz (61.8 kg)   SpO2 100%   BMI 20.71 kg/m  Well-developed well-nourished patient in NAD. HEENT reveals PERLA, EOMI, discs not visualized.  Oral cavity is clear. No oral mucosal lesions are identified. Neck is clear without evidence of cervical or supraclavicular adenopathy. Lungs are clear to A&P. Cardiac examination is essentially unremarkable with regular rate and rhythm without murmur rub or thrill. Abdomen is benign with no organomegaly or masses noted. Motor sensory and DTR levels are equal and symmetric in the upper and lower extremities. Cranial nerves II through XII are grossly intact. Proprioception is intact. No peripheral adenopathy or  edema is identified. No motor or sensory levels are noted. Crude visual fields are within normal range.  RADIOLOGY RESULTS: CT scan reviewed compatible with above-stated findings  PLAN: Present time patient is being worked up for gross hematuria he will have repeat cystoscopy and possible cystoscopy in the OR for biopsy all under the direction of Dr. Erlene Quan.  Patient may benefit from discontinuation of Advil at the present time.  I have asked to see him back in 1 year for follow-up.  He continues to remain in excellent biochemical control of his prostate cancer.  I would like to take this opportunity to thank you for allowing me to participate in the care of your patient.Noreene Filbert, MD

## 2021-02-01 ENCOUNTER — Ambulatory Visit (INDEPENDENT_AMBULATORY_CARE_PROVIDER_SITE_OTHER): Payer: Medicare HMO | Admitting: Urology

## 2021-02-01 ENCOUNTER — Other Ambulatory Visit: Payer: Self-pay

## 2021-02-01 ENCOUNTER — Other Ambulatory Visit: Payer: Self-pay | Admitting: Radiology

## 2021-02-01 VITALS — BP 105/68 | HR 105

## 2021-02-01 DIAGNOSIS — R310 Gross hematuria: Secondary | ICD-10-CM | POA: Diagnosis not present

## 2021-02-01 LAB — URINALYSIS, COMPLETE
Bilirubin, UA: NEGATIVE
Nitrite, UA: NEGATIVE
Specific Gravity, UA: 1.02 (ref 1.005–1.030)
Urobilinogen, Ur: 2 mg/dL — ABNORMAL HIGH (ref 0.2–1.0)
pH, UA: 5 (ref 5.0–7.5)

## 2021-02-01 LAB — MICROSCOPIC EXAMINATION: RBC, Urine: >30 /hpf — AB (ref 0–2)

## 2021-02-01 NOTE — H&P (View-Only) (Signed)
   02/01/21  CC:  Chief Complaint  Patient presents with  . Cysto    HPI: 70 year old male with a personal history of prostate cancer status post salvage radiation currently with undetectable PSA with ongoing gross hematuria.  He has a personal history of radiation proctitis which is also resolved.  We did a cystoscopy 2 weeks ago but visualization was very poor.  He reports over the past several days, his urine seems to be clearing but he has ongoing gross intermittent blood.  No recent clots.  Hemoglobin was checked 2 weeks ago, slight decline but overall reasonable.  NED. A&Ox3.   No respiratory distress   Abd soft, NT, ND Normal phallus with bilateral descended testicles  Cystoscopy Procedure Note  Patient identification was confirmed, informed consent was obtained, and patient was prepped using Betadine solution.  Lidocaine jelly was administered per urethral meatus.     Pre-Procedure: - Inspection reveals a normal caliber ureteral meatus.  Procedure: The flexible cystoscope was introduced without difficulty - No urethral strictures/lesions are present. - Surgically absent prostate -Bladder still murky/red secondary to hematuria.  On the posterior bladder wall, there is an irregular slightly raised surface with an adherent blood clot versus mass versus bullous edema spanning several centimeters in length.  There is no other obvious frondular papillary tumor.   Post-Procedure: - Patient tolerated the procedure well  Assessment/ Plan:  1. Gross hematuria , Strongly suspect radiation cystitis however this point, unable to get a good look in his bladder in the office with ongoing hematuria  I recommended we proceed to the operating room for cystoscopy, bladder biopsy, bilateral retrograde pyelogram and possible bladder fulguration depending on intraoperative findings.  He is agreeable this plan.  Risk of bleeding, infection, damage surrounding structures all  discussed.  If his hematuria persists, may benefit from hyperbaric oxygen. - Urinalysis, Complete - Urine culture   [name], MD

## 2021-02-01 NOTE — Progress Notes (Signed)
   02/01/21  CC:  Chief Complaint  Patient presents with  . Cysto    HPI: 70 year old male with a personal history of prostate cancer status post salvage radiation currently with undetectable PSA with ongoing gross hematuria.  He has a personal history of radiation proctitis which is also resolved.  We did a cystoscopy 2 weeks ago but visualization was very poor.  He reports over the past several days, his urine seems to be clearing but he has ongoing gross intermittent blood.  No recent clots.  Hemoglobin was checked 2 weeks ago, slight decline but overall reasonable.  NED. A&Ox3.   No respiratory distress   Abd soft, NT, ND Normal phallus with bilateral descended testicles  Cystoscopy Procedure Note  Patient identification was confirmed, informed consent was obtained, and patient was prepped using Betadine solution.  Lidocaine jelly was administered per urethral meatus.     Pre-Procedure: - Inspection reveals a normal caliber ureteral meatus.  Procedure: The flexible cystoscope was introduced without difficulty - No urethral strictures/lesions are present. - Surgically absent prostate -Bladder still murky/red secondary to hematuria.  On the posterior bladder wall, there is an irregular slightly raised surface with an adherent blood clot versus mass versus bullous edema spanning several centimeters in length.  There is no other obvious frondular papillary tumor.   Post-Procedure: - Patient tolerated the procedure well  Assessment/ Plan:  1. Gross hematuria , Strongly suspect radiation cystitis however this point, unable to get a good look in his bladder in the office with ongoing hematuria  I recommended we proceed to the operating room for cystoscopy, bladder biopsy, bilateral retrograde pyelogram and possible bladder fulguration depending on intraoperative findings.  He is agreeable this plan.  Risk of bleeding, infection, damage surrounding structures all  discussed.  If his hematuria persists, may benefit from hyperbaric oxygen. - Urinalysis, Complete - Urine culture   [name], MD

## 2021-02-03 LAB — URINE CULTURE: Organism ID, Bacteria: NO GROWTH

## 2021-02-09 ENCOUNTER — Telehealth: Payer: Self-pay | Admitting: Radiology

## 2021-02-09 NOTE — Telephone Encounter (Signed)
Patient would like to make Dr Erlene Quan aware that he has had no hematuria or blood clots in urine since 02/06/2021 but plans to proceed with bladder biopsy in the OR as planned.

## 2021-02-10 ENCOUNTER — Inpatient Hospital Stay: Admission: RE | Admit: 2021-02-10 | Payer: Medicare HMO | Source: Ambulatory Visit

## 2021-02-16 ENCOUNTER — Other Ambulatory Visit: Payer: Self-pay

## 2021-02-16 ENCOUNTER — Encounter
Admission: RE | Admit: 2021-02-16 | Discharge: 2021-02-16 | Disposition: A | Discharge status: Home / Self Care | Payer: Medicare HMO | Source: Ambulatory Visit | Attending: Urology | Admitting: Urology

## 2021-02-16 HISTORY — DX: Gastro-esophageal reflux disease without esophagitis: K21.9

## 2021-02-16 NOTE — Progress Notes (Signed)
  Perioperative Services Pre-Admission/Anesthesia Testing   Date: 02/16/21 Name: Jerry Pena MRN:   [id number]  Re: Review of preoperative prophylactic antibiotic change   Request sent to: [name], MD (routed and/or faxed via [facility])  Planned Surgical Procedure(s):    Case: [id number] Date/Time: 02/21/21 1212   Procedures:      CYSTOSCOPY WITH BIOPSY and fulgeration (N/A )     CYSTOSCOPY WITH RETROGRADE PYELOGRAM (Bilateral )   Anesthesia type: General   Pre-op diagnosis: gross hematuria   Location: [facility] [place] OR ROOM 10 / [place] [facility]   Surgeons: [name], MD    Notes: 1. Patient has a documented allergy to PCN  . Advising that PCN has caused him to experience urticarial rash in the past.   Plan:  As an evidence based approach to reducing the rate of incidence for post-operative SSI and the development of MDROs, preoperative prophylactic antimicrobial coverage reviewed. Surgeon currently has CIPROFLOXACIN ordered. In review of recent urine testing, patient with urine culture (+) for enterococcus. With enterococcus species, aminoglycosides, cephalosporins, clindamycin, and trimethoprim-sulfamethoxazole are not generally clinically effective. With that being said, pathogen did demonstrate susceptibility to fluoroquinolones, and therefore the prescribed agent will provide culture appropriate coverage. No change to preoperative antibiotic.   Honor Loh, MSN, APRN, FNP-C, CEN [facility]  Peri-operative Services Nurse Practitioner FAX: [phone] 02/16/21 2:19 PM

## 2021-02-16 NOTE — Patient Instructions (Signed)
Your procedure is scheduled on:02-21-21 MONDAY Report to the Registration Desk on the 1st floor of the [facility]-Then proceed to the 2nd floor Surgery Desk in the [facility] To find out your arrival time, please call [phone] between 1PM - 3PM on:02-18-21 FRIDAY  REMEMBER: Instructions that are not followed completely may result in serious medical risk, up to and including death; or upon the discretion of your surgeon and anesthesiologist your surgery may need to be rescheduled.  Do not eat food or drink liquids after midnight the night before surgery.  No gum chewing, lozengers or hard candies.  TAKE THESE MEDICATIONS THE MORNING OF SURGERY WITH A SIP OF WATER: -NONE  One week prior to surgery: Stop Anti-inflammatories (NSAIDS) such as Advil, Aleve, Ibuprofen, Motrin, Naproxen, Naprosyn and Aspirin based products such as Excedrin, Goodys Powder, BC Powder-OK TO TAKE TYLENOL IF NEEDED  Stop ANY OVER THE COUNTER supplements until after surgery-STOP YOUR MELATONIN AND VITAMIN C NOW-YOU MAY RESUME THESE AFTER SURGERY  No Alcohol for 24 hours before or after surgery.  No Smoking including e-cigarettes for 24 hours prior to surgery.  No chewable tobacco products for at least 6 hours prior to surgery.  No nicotine patches on the day of surgery.  Do not use any "recreational" drugs for at least a week prior to your surgery.  Please be advised that the combination of cocaine and anesthesia may have negative outcomes, up to and including death. If you test positive for cocaine, your surgery will be cancelled.  On the morning of surgery brush your teeth with toothpaste and water, you may rinse your mouth with mouthwash if you wish. Do not swallow any toothpaste or mouthwash.  Do not wear jewelry, make-up, hairpins, clips or nail polish.  Do not wear lotions, powders, or perfumes.   Do not shave body from the neck down 48 hours prior to surgery just in case you cut yourself which  could leave a site for infection.  Also, freshly shaved skin may become irritated if using the CHG soap.  Contact lenses, hearing aids and dentures may not be worn into surgery.  Do not bring valuables to the hospital. [facility] is not responsible for any missing/lost belongings or valuables.   Notify your doctor if there is any change in your medical condition (cold, fever, infection).  Wear comfortable clothing (specific to your surgery type) to the hospital.  Plan for stool softeners for home use; pain medications have a tendency to cause constipation. You can also help prevent constipation by eating foods high in fiber such as fruits and vegetables and drinking plenty of fluids as your diet allows.  After surgery, you can help prevent lung complications by doing breathing exercises.  Take deep breaths and cough every 1-2 hours. Your doctor may order a device called an Incentive Spirometer to help you take deep breaths. When coughing or sneezing, hold a pillow firmly against your incision with both hands. This is called "splinting." Doing this helps protect your incision. It also decreases belly discomfort.  If you are being admitted to the hospital overnight, leave your suitcase in the car. After surgery it may be brought to your room.  If you are being discharged the day of surgery, you will not be allowed to drive home. You will need a responsible adult (18 years or older) to drive you home and stay with you that night.   If you are taking public transportation, you will need to have a responsible  adult (18 years or older) with you. Please confirm with your physician that it is acceptable to use public transportation.   Please call the [facility] at [phone] if you have any questions about these instructions.  Visitation Policy:  Patients undergoing a surgery or procedure may have one family member or support person with them as long as that person is  not COVID-19 positive or experiencing its symptoms.  That person may remain in the waiting area during the procedure.  Inpatient Visitation:    Visiting hours are 7 a.m. to 8 p.m. Patients will be allowed one visitor. The visitor may change daily. The visitor must pass COVID-19 screenings, use hand sanitizer when entering and exiting the patient's room and wear a mask at all times, including in the patient's room. Patients must also wear a mask when staff or their visitor are in the room. Masking is required regardless of vaccination status. Systemwide, no visitors 17 or younger.  Visitation Policy Changes: The following changes are to take effect on Feb. 28 at 7 a.m.  No visitors under the age of 94. Any visitor under the age of 22 must be accompanied by an adult. Adult inpatients: Two visitors will be allowed daily and the visitors may change each day during the patient's stay. (Please note -- no changes at this time for the [facility]'s & [place], Children's Emergency Department and inpatients, Emergency Departments, Ambulatory Sites, [facility], medical practices and procedural areas.)

## 2021-02-17 ENCOUNTER — Encounter
Admission: RE | Admit: 2021-02-17 | Discharge: 2021-02-17 | Disposition: A | Discharge status: Home / Self Care | Payer: Medicare HMO | Source: Ambulatory Visit | Attending: Urology | Admitting: Urology

## 2021-02-17 ENCOUNTER — Other Ambulatory Visit: Payer: Medicare HMO

## 2021-02-17 DIAGNOSIS — R54 Age-related physical debility: Secondary | ICD-10-CM | POA: Diagnosis not present

## 2021-02-17 DIAGNOSIS — Z01818 Encounter for other preprocedural examination: Secondary | ICD-10-CM | POA: Diagnosis not present

## 2021-02-17 DIAGNOSIS — Z20822 Contact with and (suspected) exposure to covid-19: Secondary | ICD-10-CM | POA: Diagnosis not present

## 2021-02-17 DIAGNOSIS — Z01810 Encounter for preprocedural cardiovascular examination: Secondary | ICD-10-CM | POA: Diagnosis not present

## 2021-02-17 LAB — BASIC METABOLIC PANEL
Anion gap: 6 (ref 5–15)
BUN: 14 mg/dL (ref 8–23)
CO2: 27 mmol/L (ref 22–32)
Calcium: 9.2 mg/dL (ref 8.9–10.3)
Chloride: 104 mmol/L (ref 98–111)
Creatinine, Ser: 1.02 mg/dL (ref 0.61–1.24)
GFR, Estimated: >60 mL/min (ref >60)
Glucose, Bld: 96 mg/dL (ref 70–99)
Potassium: 4 mmol/L (ref 3.5–5.1)
Sodium: 137 mmol/L (ref 135–145)

## 2021-02-17 LAB — CBC
HCT: 34 % — ABNORMAL LOW (ref 39.0–52.0)
Hemoglobin: 10.4 g/dL — ABNORMAL LOW (ref 13.0–17.0)
MCH: 22.9 pg — ABNORMAL LOW (ref 26.0–34.0)
MCHC: 30.6 g/dL (ref 30.0–36.0)
MCV: 74.9 fL — ABNORMAL LOW (ref 80.0–100.0)
Platelets: 360 10*3/uL (ref 150–400)
RBC: 4.54 MIL/uL (ref 4.22–5.81)
RDW: 16.6 % — ABNORMAL HIGH (ref 11.5–15.5)
WBC: 3.7 10*3/uL — ABNORMAL LOW (ref 4.0–10.5)
nRBC: 0 % (ref 0.0–0.2)

## 2021-02-17 LAB — SARS CORONAVIRUS 2 (TAT 6-24 HRS): SARS Coronavirus 2: NEGATIVE

## 2021-02-21 ENCOUNTER — Other Ambulatory Visit: Payer: Self-pay

## 2021-02-21 ENCOUNTER — Ambulatory Visit: Payer: Medicare HMO

## 2021-02-21 ENCOUNTER — Encounter
Admission: RE | Disposition: A | Discharge status: Home / Self Care | Payer: Self-pay | Source: Home / Self Care | Attending: Urology

## 2021-02-21 ENCOUNTER — Encounter: Payer: Self-pay | Admitting: Urology

## 2021-02-21 ENCOUNTER — Ambulatory Visit
Admission: RE | Admit: 2021-02-21 | Discharge: 2021-02-21 | Disposition: A | Discharge status: Home / Self Care | Payer: Medicare HMO | Attending: Urology | Admitting: Urology

## 2021-02-21 DIAGNOSIS — N3040 Irradiation cystitis without hematuria: Secondary | ICD-10-CM | POA: Diagnosis not present

## 2021-02-21 DIAGNOSIS — Z9079 Acquired absence of other genital organ(s): Secondary | ICD-10-CM | POA: Insufficient documentation

## 2021-02-21 DIAGNOSIS — Z8546 Personal history of malignant neoplasm of prostate: Secondary | ICD-10-CM | POA: Insufficient documentation

## 2021-02-21 DIAGNOSIS — N3041 Irradiation cystitis with hematuria: Secondary | ICD-10-CM | POA: Diagnosis not present

## 2021-02-21 DIAGNOSIS — R310 Gross hematuria: Secondary | ICD-10-CM | POA: Diagnosis not present

## 2021-02-21 HISTORY — PX: CYSTOSCOPY WITH BIOPSY: SHX5122

## 2021-02-21 HISTORY — PX: CYSTOSCOPY W/ RETROGRADES: SHX1426

## 2021-02-21 SURGERY — CYSTOSCOPY, WITH BIOPSY
Anesthesia: General

## 2021-02-21 MED ORDER — ONDANSETRON HCL 4 MG/2ML IJ SOLN
INTRAMUSCULAR | Status: DC | PRN
  Administered 2021-02-21: 4 mg via INTRAVENOUS

## 2021-02-21 MED ORDER — PROPOFOL 10 MG/ML IV BOLUS
INTRAVENOUS | Status: DC | PRN
  Administered 2021-02-21: 130 mg via INTRAVENOUS

## 2021-02-21 MED ORDER — FAMOTIDINE 20 MG PO TABS
ORAL_TABLET | ORAL | Status: AC
  Administered 2021-02-21: 20 mg via ORAL
  Filled 2021-02-21: qty 1

## 2021-02-21 MED ORDER — FENTANYL CITRATE (PF) 100 MCG/2ML IJ SOLN
25.0000 ug | INTRAMUSCULAR | Status: DC | PRN
Start: 2021-02-21 — End: 2021-02-21
  Administered 2021-02-21 (×3): 25 ug via INTRAVENOUS

## 2021-02-21 MED ORDER — IOHEXOL 180 MG/ML  SOLN
INTRAMUSCULAR | Status: DC | PRN
  Administered 2021-02-21: 15 mL

## 2021-02-21 MED ORDER — FENTANYL CITRATE (PF) 100 MCG/2ML IJ SOLN
INTRAMUSCULAR | Status: AC
  Administered 2021-02-21: 25 ug via INTRAVENOUS
  Filled 2021-02-21: qty 2

## 2021-02-21 MED ORDER — CIPROFLOXACIN IN D5W 400 MG/200ML IV SOLN: 400.0000 mg | INTRAVENOUS | Status: AC

## 2021-02-21 MED ORDER — FENTANYL CITRATE (PF) 100 MCG/2ML IJ SOLN
INTRAMUSCULAR | Status: DC | PRN
  Administered 2021-02-21: 25 ug via INTRAVENOUS

## 2021-02-21 MED ORDER — PHENYLEPHRINE HCL (PRESSORS) 10 MG/ML IV SOLN
INTRAVENOUS | Status: DC | PRN
  Administered 2021-02-21 (×4): 100 ug via INTRAVENOUS

## 2021-02-21 MED ORDER — CHLORHEXIDINE GLUCONATE 0.12 % MT SOLN
OROMUCOSAL | Status: AC
  Administered 2021-02-21: 15 mL via OROMUCOSAL
  Filled 2021-02-21: qty 15

## 2021-02-21 MED ORDER — LIDOCAINE HCL (CARDIAC) PF 100 MG/5ML IV SOSY
PREFILLED_SYRINGE | INTRAVENOUS | Status: DC | PRN
  Administered 2021-02-21: 100 mg via INTRAVENOUS

## 2021-02-21 MED ORDER — DEXAMETHASONE SODIUM PHOSPHATE 10 MG/ML IJ SOLN
INTRAMUSCULAR | Status: DC | PRN
  Administered 2021-02-21: 5 mg via INTRAVENOUS

## 2021-02-21 MED ORDER — MIDAZOLAM HCL 2 MG/2ML IJ SOLN
INTRAMUSCULAR | Status: DC | PRN
  Administered 2021-02-21: 2 mg via INTRAVENOUS

## 2021-02-21 MED ORDER — LACTATED RINGERS IV SOLN: INTRAVENOUS | Status: DC

## 2021-02-21 MED ORDER — FENTANYL CITRATE (PF) 100 MCG/2ML IJ SOLN
INTRAMUSCULAR | Status: AC
  Filled 2021-02-21: qty 2

## 2021-02-21 MED ORDER — FAMOTIDINE 20 MG PO TABS: 20.0000 mg | ORAL_TABLET | Freq: Once | ORAL | Status: AC

## 2021-02-21 MED ORDER — CHLORHEXIDINE GLUCONATE 0.12 % MT SOLN: 15.0000 mL | Freq: Once | OROMUCOSAL | Status: AC

## 2021-02-21 MED ORDER — ONDANSETRON HCL 4 MG/2ML IJ SOLN: 4.0000 mg | Freq: Once | INTRAMUSCULAR | Status: DC | PRN

## 2021-02-21 MED ORDER — MIDAZOLAM HCL 2 MG/2ML IJ SOLN
INTRAMUSCULAR | Status: AC
  Filled 2021-02-21: qty 2

## 2021-02-21 MED ORDER — CIPROFLOXACIN IN D5W 400 MG/200ML IV SOLN
INTRAVENOUS | Status: AC
  Administered 2021-02-21: 400 mg via INTRAVENOUS
  Filled 2021-02-21: qty 200

## 2021-02-21 MED ORDER — ORAL CARE MOUTH RINSE: 15.0000 mL | Freq: Once | OROMUCOSAL | Status: AC

## 2021-02-21 SURGICAL SUPPLY — 25 items
BAG DRAIN CYSTO-URO LG1000N (MISCELLANEOUS) ×3 IMPLANT
BRUSH SCRUB EZ  4% CHG (MISCELLANEOUS) ×1
BRUSH SCRUB EZ 1% IODOPHOR (MISCELLANEOUS) ×3 IMPLANT
BRUSH SCRUB EZ 4% CHG (MISCELLANEOUS) ×2 IMPLANT
CATH URETL 5X70 OPEN END (CATHETERS) ×3 IMPLANT
DRAPE UTILITY 15X26 TOWEL STRL (DRAPES) ×3 IMPLANT
DRSG TELFA 4X3 1S NADH ST (GAUZE/BANDAGES/DRESSINGS) ×3 IMPLANT
ELECT REM PT RETURN 9FT ADLT (ELECTROSURGICAL) ×3
ELECTRODE REM PT RTRN 9FT ADLT (ELECTROSURGICAL) ×2 IMPLANT
GLOVE SURG ENC MOIS LTX SZ6.5 (GLOVE) ×3 IMPLANT
GOWN STRL REUS W/ TWL LRG LVL3 (GOWN DISPOSABLE) ×4 IMPLANT
GOWN STRL REUS W/TWL LRG LVL3 (GOWN DISPOSABLE) ×6
GUIDEWIRE STR DUAL SENSOR (WIRE) ×3 IMPLANT
KIT TURNOVER CYSTO (KITS) ×3 IMPLANT
MANIFOLD NEPTUNE II (INSTRUMENTS) ×3 IMPLANT
NDL SAFETY ECLIPSE 18X1.5 (NEEDLE) ×2 IMPLANT
NEEDLE HYPO 18GX1.5 SHARP (NEEDLE) ×3
PACK CYSTO AR (MISCELLANEOUS) ×3 IMPLANT
SET CYSTO W/LG BORE CLAMP LF (SET/KITS/TRAYS/PACK) ×3 IMPLANT
SOL .9 NS 3000ML IRR  AL (IV SOLUTION) ×1
SOL .9 NS 3000ML IRR AL (IV SOLUTION) ×2
SOL .9 NS 3000ML IRR UROMATIC (IV SOLUTION) ×2 IMPLANT
SURGILUBE 2OZ TUBE FLIPTOP (MISCELLANEOUS) ×3 IMPLANT
WATER STERILE IRR 1000ML POUR (IV SOLUTION) ×3 IMPLANT
WATER STERILE IRR 3000ML UROMA (IV SOLUTION) ×3 IMPLANT

## 2021-02-21 NOTE — Anesthesia Preprocedure Evaluation (Signed)
Anesthesia Evaluation  Patient identified by MRN, date of birth, ID band Patient awake    Reviewed: Allergy & Precautions, NPO status , Patient's Chart, lab work & pertinent test results  History of Anesthesia Complications Negative for: history of anesthetic complications  Airway Mallampati: II       Dental  (+) Poor Dentition, Missing, Chipped   Pulmonary neg sleep apnea, neg COPD, Current Smoker and Patient abstained from smoking.,           Cardiovascular (-) hypertension(-) Past MI and (-) CHF (-) dysrhythmias (-) Valvular Problems/Murmurs     Neuro/Psych neg Seizures    GI/Hepatic Neg liver ROS, GERD  Medicated and Controlled,  Endo/Other  neg diabetes  Renal/GU negative Renal ROS     Musculoskeletal   Abdominal   Peds  Hematology   Anesthesia Other Findings   Reproductive/Obstetrics                            Anesthesia Physical Anesthesia Plan  ASA: II  Anesthesia Plan: General   Post-op Pain Management:    Induction: Intravenous  PONV Risk Score and Plan: 1 and Ondansetron  Airway Management Planned: LMA  Additional Equipment:   Intra-op Plan:   Post-operative Plan:   Informed Consent: I have reviewed the patients History and Physical, chart, labs and discussed the procedure including the risks, benefits and alternatives for the proposed anesthesia with the patient or authorized representative who has indicated his/her understanding and acceptance.       Plan Discussed with:   Anesthesia Plan Comments:         Anesthesia Quick Evaluation

## 2021-02-21 NOTE — Interval H&P Note (Signed)
History and Physical Interval Note:  02/21/2021 12:28 PM  Jerry Pena  has presented today for surgery, with the diagnosis of gross hematuria.  The various methods of treatment have been discussed with the patient and family. After consideration of risks, benefits and other options for treatment, the patient has consented to  Procedure(s): CYSTOSCOPY WITH BIOPSY and fulgeration (N/A) CYSTOSCOPY WITH RETROGRADE PYELOGRAM (Bilateral) as a surgical intervention.  The patient's history has been reviewed, patient examined, no change in status, stable for surgery.  I have reviewed the patient's chart and labs.  Questions were answered to the patient's satisfaction.    RRR CTAB   [name]

## 2021-02-21 NOTE — Op Note (Signed)
Date of procedure: 02/21/21  Preoperative diagnosis:  1. History of prostate cancer status post prostatectomy/whole pelvic radiation 2. Gross hematuria  Postoperative diagnosis:  1. Same as above 2. Radiation cystitis  Procedure: 1. Cystoscopy 2. Bilateral retrograde pyelogram 3. Bladder biopsy with fulguration  Surgeon: [name], MD  Anesthesia: General  Complications: None  Intraoperative findings: Prostate surgically absent.  Patchy vascular erythema consistent with visual appearance of radiation cystitis on posterior bladder wall, biopsied and fulgurated.  Slight narrowing along the iliacs bilaterally with prompt drainage, no filling defects on retrograde pyelogram.  EBL: Minimal  Specimens: Bladder biopsy  Drains: None  Indication: Jerry Pena is a 70 y.o. patient with intermittent gross hematuria.  After reviewing the management options for treatment, he elected to proceed with the above surgical procedure(s). We have discussed the potential benefits and risks of the procedure, side effects of the proposed treatment, the likelihood of the patient achieving the goals of the procedure, and any potential problems that might occur during the procedure or recuperation. Informed consent has been obtained.  Description of procedure:  The patient was taken to the operating room and general anesthesia was induced.  The patient was placed in the dorsal lithotomy position, prepped and draped in the usual sterile fashion, and preoperative antibiotics were administered. A preoperative time-out was performed.   A 21 French scope was advanced per urethra into the bladder.  Notably, the prostate was surgically absent.  Upon entry into the bladder, each of the UOs is noted to be patent relatively close to the bladder neck.  There is some patchy vascular slightly raised erythema along the posterior bladder wall.  This hypervascular appearance was visually consistent with radiation  cystitis and was focal involving primarily the trigone and posterior bladder wall.  No other lesions, tumors or any other pathology identified.  Relative sparing of the remainder of the bladder.   Attention was turned to the right ureteral orifice.  This cannulated using a 5 [place] open-ended ureteral catheter.  Gentle retrograde pyelogram was performed on this side.  There was some narrowing near the level of the iliac vessels without any discrete filling defects.  This was approximately 1 or so centimeters in length.  Upon removing the open-ended ureteral catheter, this drained relatively promptly.  Attention was then turned to the left ureteral orifice.  The same procedure was performed in a retrograde on this side was in fact very similar.  The ureter was delicate with slightly narrowed along the iliacs and also drained promptly.  There is no concern for upper tract urothelial lesions.  Next, cold cup biopsy forceps were used to biopsy the ureter which had a hypervascular appearance.  5 or 6 biopsies were taken.  The area was then very carefully fulgurated.  Visually, the findings were consistent with radiation cystitis.  The bladder was drained.  The patient was then cleaned and dried, repositioned supine position, reversed from anesthesia, and taken to the PACU in stable condition.  Plan: We will call him with the pathology results.  We will have him follow-up in about a month to see how he is doing clinically.  [name], M.D.

## 2021-02-21 NOTE — Anesthesia Postprocedure Evaluation (Signed)
Anesthesia Post Note  Patient: Jerry Pena  Procedure(s) Performed: CYSTOSCOPY WITH BIOPSY and fulgeration (N/A ) CYSTOSCOPY WITH RETROGRADE PYELOGRAM (Bilateral )  Patient location during evaluation: PACU Anesthesia Type: General Level of consciousness: awake and alert Pain management: pain level controlled Vital Signs Assessment: post-procedure vital signs reviewed and stable Respiratory status: spontaneous breathing and respiratory function stable Cardiovascular status: stable Anesthetic complications: no   No complications documented.   Last Vitals:  Vitals:   02/21/21 1415 02/21/21 1430  BP: 116/66 130/69  Pulse: 70 68  Resp: 17 15  Temp: (!) 36.1 C   SpO2: 97% 97%    Last Pain:  Vitals:   02/21/21 1430  TempSrc:   PainSc: 3                  C[place] Jr K

## 2021-02-21 NOTE — Transfer of Care (Signed)
Immediate Anesthesia Transfer of Care Note  Patient: Jerry Pena  Procedure(s) Performed: CYSTOSCOPY WITH BIOPSY and fulgeration (N/A ) CYSTOSCOPY WITH RETROGRADE PYELOGRAM (Bilateral )  Patient Location: PACU  Anesthesia Type:General  Level of Consciousness: drowsy  Airway & Oxygen Therapy: Patient Spontanous Breathing and Patient connected to face mask oxygen  Post-op Assessment: Report given to RN and Post -op Vital signs reviewed and stable  Post vital signs: Reviewed and stable  Last Vitals:  Vitals Value Taken Time  BP 121/71 02/21/21 1341  Temp    Pulse 63 02/21/21 1342  Resp 12 02/21/21 1342  SpO2 100 % 02/21/21 1342  Vitals shown include unvalidated device data.  Last Pain:  Vitals:   02/21/21 0930  TempSrc: Temporal  PainSc: 0-No pain         Complications: No complications documented.

## 2021-02-21 NOTE — Discharge Instructions (Signed)
AMBULATORY SURGERY  DISCHARGE INSTRUCTIONS   1) The drugs that you were given will stay in your system until tomorrow so for the next 24 hours you should not:  A) Drive an automobile B) Make any legal decisions C) Drink any alcoholic beverage   2) You may resume regular meals tomorrow.  Today it is better to start with liquids and gradually work up to solid foods.  You may eat anything you prefer, but it is better to start with liquids, then soup and crackers, and gradually work up to solid foods.   3) Please notify your doctor immediately if you have any unusual bleeding, trouble breathing, redness and pain at the surgery site, drainage, fever, or pain not relieved by medication. 4)   5) Your post-operative visit with Dr.                                     is: Date:                        Time:    Please call to schedule your post-operative visit.  6) Additional Instructions:    Transurethral Resection of Bladder Tumor (TURBT) or Bladder Biopsy   Definition:  Transurethral Resection of the Bladder Tumor is a surgical procedure used to diagnose and remove tumors within the bladder. TURBT is the most common treatment for early stage bladder cancer.  General instructions:     Your recent bladder surgery requires very little post hospital care but some definite precautions.  Despite the fact that no skin incisions were used, the area around the bladder incisions are raw and covered with scabs to promote healing and prevent bleeding. Certain precautions are needed to insure that the scabs are not disturbed over the next 2-4 weeks while the healing proceeds.  Because the raw surface inside your bladder and the irritating effects of urine you may expect frequency of urination and/or urgency (a stronger desire to urinate) and perhaps even getting up at night more often. This will usually resolve or improve slowly over the healing period. You may see some blood in your urine over  the first 6 weeks. Do not be alarmed, even if the urine was clear for a while. Get off your feet and drink lots of fluids until clearing occurs. If you start to pass clots or don't improve call us.  Diet:  You may return to your normal diet immediately. Because of the raw surface of your bladder, alcohol, spicy foods, foods high in acid and drinks with caffeine may cause irritation or frequency and should be used in moderation. To keep your urine flowing freely and avoid constipation, drink plenty of fluids during the day (8-10 glasses). Tip: Avoid cranberry juice because it is very acidic.  Activity:  Your physical activity doesn't need to be restricted. However, if you are very active, you may see some blood in the urine. We suggest that you reduce your activity under the circumstances until the bleeding has stopped.  Bowels:  It is important to keep your bowels regular during the postoperative period. Straining with bowel movements can cause bleeding. A bowel movement every other day is reasonable. Use a mild laxative if needed, such as milk of magnesia 2-3 tablespoons, or 2 Dulcolax tablets. Call if you continue to have problems. If you had been taking narcotics for pain, before, during or after your  surgery, you may be constipated. Take a laxative if necessary.    Medication:  You should resume your pre-surgery medications unless told not to. In addition you may be given an antibiotic to prevent or treat infection. Antibiotics are not always necessary. All medication should be taken as prescribed until the bottles are finished unless you are having an unusual reaction to one of the drugs.   [place] [place], [place] [address] [phone]        AMBULATORY SURGERY  DISCHARGE INSTRUCTIONS   7) The drugs that you were given will stay in your system until tomorrow so for the next 24 hours you should not:  D) Drive an automobile E) Make any legal  decisions F) Drink any alcoholic beverage   8) You may resume regular meals tomorrow.  Today it is better to start with liquids and gradually work up to solid foods.  You may eat anything you prefer, but it is better to start with liquids, then soup and crackers, and gradually work up to solid foods.   9) Please notify your doctor immediately if you have any unusual bleeding, trouble breathing, redness and pain at the surgery site, drainage, fever, or pain not relieved by medication. 10)   11) Your post-operative visit with Dr.                                     is: Date:                        Time:    Please call to schedule your post-operative visit.  12) Additional Instructions:

## 2021-02-21 NOTE — Anesthesia Procedure Notes (Signed)
Procedure Name: LMA Insertion Date/Time: 02/21/2021 1:06 PM Performed by: Merryl Hacker, RN Pre-anesthesia Checklist: Patient identified, Patient being monitored, Timeout performed, Emergency Drugs available and Suction available Patient Re-evaluated:Patient Re-evaluated prior to induction Oxygen Delivery Method: Circle system utilized Preoxygenation: Pre-oxygenation with 100% oxygen Induction Type: IV induction Ventilation: Mask ventilation without difficulty LMA: LMA inserted LMA Size: 4.0 Tube type: Oral Number of attempts: 1 Placement Confirmation: positive ETCO2 and breath sounds checked- equal and bilateral Tube secured with: Tape Dental Injury: Teeth and Oropharynx as per pre-operative assessment

## 2021-02-22 ENCOUNTER — Telehealth: Payer: Self-pay | Admitting: *Deleted

## 2021-02-22 LAB — SURGICAL PATHOLOGY

## 2021-02-22 NOTE — Telephone Encounter (Addendum)
Patient informed, voiced understanding.  Follow up scheduled. ----- Message from [name], MD sent at 02/22/2021 11:33 AM EST ----- Please let this patient know that his pathology was benign, consistent with radiation changes of the bladder as we expected.  I tentatively set to follow-up with me in a month to check symptoms but if he is doing well, he can keep his 10-month follow-up instead.  [name], MD

## 2021-03-01 DIAGNOSIS — Z1159 Encounter for screening for other viral diseases: Secondary | ICD-10-CM | POA: Diagnosis not present

## 2021-03-01 DIAGNOSIS — Z20828 Contact with and (suspected) exposure to other viral communicable diseases: Secondary | ICD-10-CM | POA: Diagnosis not present

## 2021-03-09 ENCOUNTER — Telehealth: Payer: Self-pay

## 2021-03-09 NOTE — Telephone Encounter (Signed)
Patient called stating he is still having dysuria and urgency worsening post op biopsy w/fulgeration on 02/21/21. Patient was offered a PA appointment tomorrow for further evaluation. Patient declined he is unable to come to the office until Tuesday next week. Appointment was made, patient was told that if he developed fever chills nausea or vomiting or worsening symptoms to call back for a sooner appt

## 2021-03-15 ENCOUNTER — Ambulatory Visit (INDEPENDENT_AMBULATORY_CARE_PROVIDER_SITE_OTHER): Payer: Medicare HMO | Admitting: Physician Assistant

## 2021-03-15 ENCOUNTER — Other Ambulatory Visit: Payer: Self-pay

## 2021-03-15 ENCOUNTER — Encounter: Payer: Self-pay | Admitting: Physician Assistant

## 2021-03-15 VITALS — BP 125/75 | HR 86 | Ht 68.0 in | Wt 145.0 lb

## 2021-03-15 DIAGNOSIS — R300 Dysuria: Secondary | ICD-10-CM | POA: Diagnosis not present

## 2021-03-15 DIAGNOSIS — R3915 Urgency of urination: Secondary | ICD-10-CM

## 2021-03-15 LAB — MICROSCOPIC EXAMINATION: Bacteria, UA: NONE SEEN

## 2021-03-15 LAB — URINALYSIS, COMPLETE
Bilirubin, UA: NEGATIVE
Glucose, UA: NEGATIVE
Ketones, UA: NEGATIVE
Nitrite, UA: NEGATIVE
Protein,UA: NEGATIVE
Specific Gravity, UA: 1.01 (ref 1.005–1.030)
Urobilinogen, Ur: 0.2 mg/dL (ref 0.2–1.0)
pH, UA: 5.5 (ref 5.0–7.5)

## 2021-03-15 LAB — BLADDER SCAN AMB NON-IMAGING

## 2021-03-15 MED ORDER — MIRABEGRON ER 25 MG PO TB24: 25.0000 mg | ORAL_TABLET | Freq: Every day | ORAL | 0 refills | Status: DC

## 2021-03-15 NOTE — Patient Instructions (Signed)
Start Myrbetriq daily for management of your urinary urgency. It can take up to 2 weeks to notice improvement in your symptoms on this medication. In the meantime, be mindful of your intake of common bladder irritants including caffeine, alcohol, carbonated beverages, chocolate, spicy foods, and acidic foods including citrus and tomatoes.

## 2021-03-15 NOTE — Progress Notes (Signed)
03/15/2021 9:42 AM   Jerry Pena [birth year] [id number]  CC: Chief Complaint  Patient presents with  . Dysuria       . Urinary Urgency    HPI: Jerry Pena is a 70 y.o. male with PMH prostate cancer s/p prostatectomy and whole pelvic radiation, now with radiation cystitis and proctitis who presents today for evaluation of possible UTI.  He underwent cystoscopy with bilateral retrograde pyelogram and bladder biopsy with fulguration with Dr. Erlene Quan on 02/21/2021 with visual and pathology findings consistent with radiation cystitis.  Today he reports painful urinary urgency and terminal dysuria since his recent urologic procedure.  He denies fever, chills, nausea, vomiting, and flank pain.  In-office UA today positive for 2+ blood and 1+ leukocyte esterase; urine microscopy with 11-30 WBCs/HPF and 3-10 RBCs/HPF. PVR 60mL.  PMH: Past Medical History:  Diagnosis Date  . Beta thalassemia trait 09/27/2018  . Cancer ([place]) 10/2017   Prostate  . DDD (degenerative disc disease), lumbar 06/13/2017   Lumbar imaging June 2018  . Elevated PSA 09/12/2017   Refer to urologist, Sept 2018  . Facet hypertrophy of lumbar region 06/13/2017   Lumbar imaging June 2018  . GERD (gastroesophageal reflux disease)    occ no meds  . Hyperlipidemia     Surgical History: Past Surgical History:  Procedure Laterality Date  . CATARACT EXTRACTION W/ INTRAOCULAR LENS IMPLANT Left 3/ 28/2014   eyes  . CATARACT EXTRACTION W/ INTRAOCULAR LENS IMPLANT Right 04/23/2013  . COLONOSCOPY WITH PROPOFOL N/A 07/29/2019   Procedure: COLONOSCOPY WITH PROPOFOL;  Surgeon: Lin Landsman, MD;  Location: [facility] ENDOSCOPY;  Service: Gastroenterology;  Laterality: N/A;  . CYSTOSCOPY W/ RETROGRADES Bilateral 02/21/2021   Procedure: CYSTOSCOPY WITH RETROGRADE PYELOGRAM;  Surgeon: [name], MD;  Location: [facility] ORS;  Service: Urology;  Laterality: Bilateral;  . CYSTOSCOPY WITH BIOPSY N/A 02/21/2021   Procedure:  CYSTOSCOPY WITH BIOPSY and fulgeration;  Surgeon: [name], MD;  Location: [facility] ORS;  Service: Urology;  Laterality: N/A;  . FLEXIBLE SIGMOIDOSCOPY N/A 11/14/2019   Procedure: FLEXIBLE SIGMOIDOSCOPY;  Surgeon: Lin Landsman, MD;  Location: [facility] ENDOSCOPY;  Service: Gastroenterology;  Laterality: N/A;  . PELVIC LYMPH NODE DISSECTION N/A 12/10/2017   Procedure: PELVIC LYMPH NODE DISSECTION;  Surgeon: [name], MD;  Location: [facility] ORS;  Service: Urology;  Laterality: N/A;  . PROSTATE SURGERY    . ROBOT ASSISTED LAPAROSCOPIC RADICAL PROSTATECTOMY N/A 12/10/2017   Procedure: ROBOTIC ASSISTED LAPAROSCOPIC RADICAL PROSTATECTOMY;  Surgeon: [name], MD;  Location: [facility] ORS;  Service: Urology;  Laterality: N/A;    Home Medications:  Allergies as of 03/15/2021      Reactions   Aspirin Other (See Comments)   Upset stomach, indigestion and heart burn   Penicillins Hives, Other (See Comments)   Has patient had a PCN reaction causing immediate rash, facial/tongue/throat swelling, SOB or lightheadedness with hypotension: Yes Has patient had a PCN reaction causing severe rash involving mucus membranes or skin necrosis: No Has patient had a PCN reaction that required hospitalization: Yes Has patient had a PCN reaction occurring within the last 10 years: No If all of the above answers are "NO", then may proceed with Cephalosporin use.      Medication List       Accurate as of March 15, 2021  9:42 AM. If you have any questions, ask your nurse or doctor.        acetaminophen 500 MG tablet Commonly known as: TYLENOL Take 1,000 mg by mouth  every 6 (six) hours as needed for moderate pain or headache.   aspirin 81 MG chewable tablet CHEW ONE TABLET BY MOUTH ONCE EVERY DAY   calcium-vitamin D 500-200 MG-UNIT tablet Commonly known as: OSCAL WITH D Take 1 tablet by mouth daily.   docusate sodium 100 MG capsule Commonly known as: COLACE Take 100 mg by mouth daily.   folic  acid 865 MCG tablet Commonly known as: FOLVITE Take 1 tablet (400 mcg total) by mouth daily.   ibuprofen 200 MG tablet Commonly known as: ADVIL Take 400 mg by mouth every 6 (six) hours as needed for headache or moderate pain.   melatonin 5 MG Tabs Take 5 mg by mouth daily as needed.   multivitamin tablet Take 1 tablet by mouth daily.   NONFORMULARY OR COMPOUNDED ITEM Super Trimix (30/2/20)-(Pap/Phent/PGE)  Dosage: Inject 0.5 cc per injection Titrate up to 1cc as needed   Vial 53ml  Qty #10 Refills 6  [place] [phone] Fax [phone]   polyethylene glycol 17 g packet Commonly known as: MIRALAX / GLYCOLAX Take 17 g by mouth daily.   vitamin B-12 100 MCG tablet Commonly known as: CYANOCOBALAMIN Take 100 mcg by mouth daily.   vitamin C 500 MG tablet Commonly known as: ASCORBIC ACID Take 500 mg by mouth daily.       Allergies:  Allergies  Allergen Reactions  . Aspirin Other (See Comments)    Upset stomach, indigestion and heart burn  . Penicillins Hives and Other (See Comments)    Has patient had a PCN reaction causing immediate rash, facial/tongue/throat swelling, SOB or lightheadedness with hypotension: Yes Has patient had a PCN reaction causing severe rash involving mucus membranes or skin necrosis: No Has patient had a PCN reaction that required hospitalization: Yes Has patient had a PCN reaction occurring within the last 10 years: No If all of the above answers are "NO", then may proceed with Cephalosporin use.     Family History: Family History  Problem Relation Age of Onset  . Stroke Mother   . Stroke Father   . Cancer Sister        breast, lung  . Pulmonary Hypertension Sister   . Cancer Son        prostate  . Prostate cancer Son   . Hypertension Sister   . Bladder Cancer Neg Hx   . Kidney cancer Neg Hx     Social History:   reports that he has been smoking cigars. He has smoked for the past 48.00 years. He has never used  smokeless tobacco. He reports current alcohol use. He reports that he does not use drugs.  Physical Exam: BP 125/75   Pulse 86   Ht 5\' 8"  (1.727 m)   Wt 145 lb (65.8 kg)   BMI 22.05 kg/m   Constitutional:  Alert and oriented, no acute distress, nontoxic appearing HEENT: [place], AT Cardiovascular: No clubbing, cyanosis, or edema Respiratory: Normal respiratory effort, no increased work of breathing Skin: No rashes, bruises or suspicious lesions Neurologic: Grossly intact, no focal deficits, moving all 4 extremities Psychiatric: Normal mood and affect  Laboratory Data: Results for orders placed or performed in visit on 03/15/21  Microscopic Examination   Urine  Result Value Ref Range   WBC, UA 11-30 (A) 0 - 5 /hpf   RBC 3-10 (A) 0 - 2 /hpf   Epithelial Cells (non renal) 0-10 0 - 10 /hpf   Bacteria, UA None seen None seen/Few  Urinalysis, Complete  Result Value Ref Range   Specific Gravity, UA 1.010 1.005 - 1.030   pH, UA 5.5 5.0 - 7.5   Color, UA Yellow Yellow   Appearance Ur Clear Clear   Leukocytes,UA 1+ (A) Negative   Protein,UA Negative Negative/Trace   Glucose, UA Negative Negative   Ketones, UA Negative Negative   RBC, UA 2+ (A) Negative   Bilirubin, UA Negative Negative   Urobilinogen, Ur 0.2 0.2 - 1.0 mg/dL   Nitrite, UA Negative Negative   Microscopic Examination See below:   Bladder Scan (Post Void Residual) in office  Result Value Ref Range   Scan Result 40mL    Assessment & Plan:   1. Urinary urgency Worsened following recent urologic procedure.  UA today consistent with postoperative changes.  Will defer antibiotics and send for culture for further evaluation.  I offered him Myrbetriq 25 mg daily for management of his urinary symptoms and counseled him to refrain from bladder irritants.  He expressed understanding. - Urinalysis, Complete - Bladder Scan (Post Void Residual) in office - CULTURE, URINE COMPREHENSIVE - mirabegron ER (MYRBETRIQ) 25 MG TB24  tablet; Take 1 tablet (25 mg total) by mouth daily.  Dispense: 28 tablet; Refill: 0   Return if symptoms worsen or fail to improve.  Debroah Loop, PA-C  [facility] [address] [place], [place] [address] [phone]

## 2021-03-18 LAB — CULTURE, URINE COMPREHENSIVE

## 2021-03-29 ENCOUNTER — Ambulatory Visit: Payer: Self-pay | Admitting: Urology

## 2021-04-01 ENCOUNTER — Ambulatory Visit (INDEPENDENT_AMBULATORY_CARE_PROVIDER_SITE_OTHER): Payer: Medicare HMO | Admitting: Urology

## 2021-04-01 ENCOUNTER — Other Ambulatory Visit: Payer: Self-pay

## 2021-04-01 VITALS — BP 158/99 | HR 70 | Ht 68.0 in | Wt 145.0 lb

## 2021-04-01 DIAGNOSIS — N393 Stress incontinence (female) (male): Secondary | ICD-10-CM | POA: Diagnosis not present

## 2021-04-01 DIAGNOSIS — C61 Malignant neoplasm of prostate: Secondary | ICD-10-CM

## 2021-04-01 DIAGNOSIS — R310 Gross hematuria: Secondary | ICD-10-CM | POA: Diagnosis not present

## 2021-04-01 DIAGNOSIS — N3040 Irradiation cystitis without hematuria: Secondary | ICD-10-CM

## 2021-04-01 LAB — URINALYSIS, COMPLETE
Bilirubin, UA: NEGATIVE
Glucose, UA: NEGATIVE
Ketones, UA: NEGATIVE
Nitrite, UA: NEGATIVE
Specific Gravity, UA: 1.03 (ref 1.005–1.030)
Urobilinogen, Ur: 0.2 mg/dL (ref 0.2–1.0)
pH, UA: 5 (ref 5.0–7.5)

## 2021-04-01 LAB — MICROSCOPIC EXAMINATION
Bacteria, UA: NONE SEEN
RBC, Urine: >30 /hpf — AB (ref 0–2)

## 2021-04-01 NOTE — Progress Notes (Signed)
04/01/2021 9:50 AM   Jerry Pena 27-Aug-1951 [id number]  Referring provider: [facility], [address] [name],  [name] [address]  Chief Complaint  Patient presents with  . Hematuria    HPI: 70 year old male with history of prostate cancer status post prostatectomy/salvage XRT more recently with radiation cystitis returns today for routine follow-up.  Most recently, he underwent biopsy on 02/21/2021 with findings as well as pathology consistent with radiation cystitis.  He returned to the office about 3 weeks postop with worsening urinary symptoms at which time his urinalysis was consistent with recent GU manipulation.  His urine culture was negative.  He was not given antibiotics.  He reports today that he has had 3 or 4 episodes of gross hematuria including just yesterday with few small clots.  Each time was immediately preceded by taking ibuprofen for headache.  He is otherwise doing fine.  He denies any gross hematuria today.  He thinks that his bleeding overall has improved significantly.  He is concerned today about his urinary leakage.  He feels like it is worse than ever before.  He started to get bothered by this.  He is interested in consideration of some sort of alternative option for this.  He also mentions today that he is extremely bothered by his right inguinal hernia.  He has to hold it when he laughs coughs and sneezes.  He was going to have this repaired but needed to have his hematuria worked up first.   PMH: Past Medical History:  Diagnosis Date  . Beta thalassemia trait 09/27/2018  . Cancer ([place]) 10/2017   Prostate  . DDD (degenerative disc disease), lumbar 06/13/2017   Lumbar imaging June 2018  . Elevated PSA 09/12/2017   Refer to urologist, Sept 2018  . Facet hypertrophy of lumbar region 06/13/2017   Lumbar imaging June 2018  . GERD (gastroesophageal reflux disease)    occ no meds  . Hyperlipidemia     Surgical  History: Past Surgical History:  Procedure Laterality Date  . CATARACT EXTRACTION W/ INTRAOCULAR LENS IMPLANT Left 3/ 28/2014   eyes  . CATARACT EXTRACTION W/ INTRAOCULAR LENS IMPLANT Right 04/23/2013  . COLONOSCOPY WITH PROPOFOL N/A 07/29/2019   Procedure: COLONOSCOPY WITH PROPOFOL;  Surgeon: Lin Landsman, MD;  Location: [facility] ENDOSCOPY;  Service: Gastroenterology;  Laterality: N/A;  . CYSTOSCOPY W/ RETROGRADES Bilateral 02/21/2021   Procedure: CYSTOSCOPY WITH RETROGRADE PYELOGRAM;  Surgeon: [name], MD;  Location: [facility] ORS;  Service: Urology;  Laterality: Bilateral;  . CYSTOSCOPY WITH BIOPSY N/A 02/21/2021   Procedure: CYSTOSCOPY WITH BIOPSY and fulgeration;  Surgeon: [name], MD;  Location: [facility] ORS;  Service: Urology;  Laterality: N/A;  . FLEXIBLE SIGMOIDOSCOPY N/A 11/14/2019   Procedure: FLEXIBLE SIGMOIDOSCOPY;  Surgeon: Lin Landsman, MD;  Location: [facility] ENDOSCOPY;  Service: Gastroenterology;  Laterality: N/A;  . PELVIC LYMPH NODE DISSECTION N/A 12/10/2017   Procedure: PELVIC LYMPH NODE DISSECTION;  Surgeon: [name], MD;  Location: [facility] ORS;  Service: Urology;  Laterality: N/A;  . PROSTATE SURGERY    . ROBOT ASSISTED LAPAROSCOPIC RADICAL PROSTATECTOMY N/A 12/10/2017   Procedure: ROBOTIC ASSISTED LAPAROSCOPIC RADICAL PROSTATECTOMY;  Surgeon: [name], MD;  Location: [facility] ORS;  Service: Urology;  Laterality: N/A;    Home Medications:  Allergies as of 04/01/2021      Reactions   Aspirin Other (See Comments)   Upset stomach, indigestion and heart burn   Penicillins Hives, Other (See Comments)   Has patient had a  PCN reaction causing immediate rash, facial/tongue/throat swelling, SOB or lightheadedness with hypotension: Yes Has patient had a PCN reaction causing severe rash involving mucus membranes or skin necrosis: No Has patient had a PCN reaction that required hospitalization: Yes Has patient had a PCN reaction occurring within the last 10  years: No If all of the above answers are "NO", then may proceed with Cephalosporin use.      Medication List       Accurate as of April 01, 2021  9:50 AM. If you have any questions, ask your nurse or doctor.        acetaminophen 500 MG tablet Commonly known as: TYLENOL Take 1,000 mg by mouth every 6 (six) hours as needed for moderate pain or headache.   aspirin 81 MG chewable tablet CHEW ONE TABLET BY MOUTH ONCE EVERY DAY   calcium-vitamin D 500-200 MG-UNIT tablet Commonly known as: OSCAL WITH D Take 1 tablet by mouth daily.   docusate sodium 100 MG capsule Commonly known as: COLACE Take 100 mg by mouth daily.   folic acid 621 MCG tablet Commonly known as: FOLVITE Take 1 tablet (400 mcg total) by mouth daily.   ibuprofen 200 MG tablet Commonly known as: ADVIL Take 400 mg by mouth every 6 (six) hours as needed for headache or moderate pain.   melatonin 5 MG Tabs Take 5 mg by mouth daily as needed.   mirabegron ER 25 MG Tb24 tablet Commonly known as: MYRBETRIQ Take 1 tablet (25 mg total) by mouth daily.   multivitamin tablet Take 1 tablet by mouth daily.   NONFORMULARY OR COMPOUNDED ITEM Super Trimix (30/2/20)-(Pap/Phent/PGE)  Dosage: Inject 0.5 cc per injection Titrate up to 1cc as needed   Vial 71ml  Qty #10 Refills 6  [place] [phone] Fax [phone]   polyethylene glycol 17 g packet Commonly known as: MIRALAX / GLYCOLAX Take 17 g by mouth daily.   vitamin B-12 100 MCG tablet Commonly known as: CYANOCOBALAMIN Take 100 mcg by mouth daily.   vitamin C 500 MG tablet Commonly known as: ASCORBIC ACID Take 500 mg by mouth daily.       Allergies:  Allergies  Allergen Reactions  . Aspirin Other (See Comments)    Upset stomach, indigestion and heart burn  . Penicillins Hives and Other (See Comments)    Has patient had a PCN reaction causing immediate rash, facial/tongue/throat swelling, SOB or lightheadedness with hypotension:  Yes Has patient had a PCN reaction causing severe rash involving mucus membranes or skin necrosis: No Has patient had a PCN reaction that required hospitalization: Yes Has patient had a PCN reaction occurring within the last 10 years: No If all of the above answers are "NO", then may proceed with Cephalosporin use.     Family History: Family History  Problem Relation Age of Onset  . Stroke Mother   . Stroke Father   . Cancer Sister        breast, lung  . Pulmonary Hypertension Sister   . Cancer Son        prostate  . Prostate cancer Son   . Hypertension Sister   . Bladder Cancer Neg Hx   . Kidney cancer Neg Hx     Social History:  reports that he has been smoking cigars. He has smoked for the past 48.00 years. He has never used smokeless tobacco. He reports current alcohol use. He reports that he does not use drugs.   Physical Exam: BP (!) 158/99  Pulse 70   Ht 5\' 8"  (1.727 m)   Wt 145 lb (65.8 kg)   BMI 22.05 kg/m   Constitutional:  Alert and oriented, No acute distress. HEENT: [place] AT, moist mucus membranes.  Trachea midline, no masses. Cardiovascular: No clubbing, cyanosis, or edema. Respiratory: Normal respiratory effort, no increased work of breathing. Skin: No rashes, bruises or suspicious lesions. Neurologic: Grossly intact, no focal deficits, moving all 4 extremities. Psychiatric: Normal mood and affect.   Assessment & Plan:    1. Gross hematuria Secondary to radiation cystitis, biopsy-proven  Intermittent less frequent episodes, decreased severity which is reassuring  Exacerbated by NSAIDs, advised to use Tylenol if he has headaches in lieu of NSAIDs  If he continues to have worsening or more severe symptoms, consider hyperbaric oxygen.  He is agreeable this plan. - Urinalysis, Complete  2. Prostate cancer [facility]) Currently NED, will recheck PSA in 6 months - PSA; Future  3. Radiation cystitis As above  4. Stress incontinence of urine Increasing  bother no stress incontinence, likely exacerbated by recent bladder biopsy/GU manipulation  We did discuss options for refractory stress incontinence including sling versus sphincter placement which I do not perform in the community setting.  This would need to be performed at a tertiary care center and his preference would be [facility].  We discussed my concern about his history of both radiation cystitis and proctitis including the integrity of tissue in his pelvis which may increase the risk of implant erosion/infection and implant complications.  He is given some literature today on a treatment options.  He will let us know if he would like a referral.  All questions answered.  Follow-up in 6 months with PSA or sooner as needed   [name], MD  [place] [address] [place],  [address] [phone]  I spent 30 total minutes on the day of the encounter including pre-visit review of the medical record, face-to-face time with the patient, and post visit ordering of labs/imaging/tests.

## 2021-06-30 ENCOUNTER — Ambulatory Visit: Payer: Medicare HMO

## 2021-07-07 ENCOUNTER — Ambulatory Visit (INDEPENDENT_AMBULATORY_CARE_PROVIDER_SITE_OTHER): Payer: Medicare HMO

## 2021-07-07 ENCOUNTER — Other Ambulatory Visit: Payer: Self-pay

## 2021-07-07 VITALS — BP 108/68 | HR 90 | Temp 98.2°F | Resp 16 | Ht 68.0 in | Wt 134.6 lb

## 2021-07-07 DIAGNOSIS — M25549 Pain in joints of unspecified hand: Secondary | ICD-10-CM | POA: Insufficient documentation

## 2021-07-07 DIAGNOSIS — M19011 Primary osteoarthritis, right shoulder: Secondary | ICD-10-CM | POA: Insufficient documentation

## 2021-07-07 DIAGNOSIS — Z0000 Encounter for general adult medical examination without abnormal findings: Secondary | ICD-10-CM | POA: Diagnosis not present

## 2021-07-07 DIAGNOSIS — M7540 Impingement syndrome of unspecified shoulder: Secondary | ICD-10-CM | POA: Insufficient documentation

## 2021-07-07 DIAGNOSIS — D1720 Benign lipomatous neoplasm of skin and subcutaneous tissue of unspecified limb: Secondary | ICD-10-CM | POA: Insufficient documentation

## 2021-07-07 NOTE — Progress Notes (Signed)
Subjective:   Jerry Pena is a 70 y.o. male who presents for Medicare Annual/Subsequent preventive examination.  Review of Systems     Cardiac Risk Factors include: advanced age (>27men, >70 women);male gender     Objective:    Today's Vitals   07/07/21 0832  BP: 108/68  Pulse: 90  Resp: 16  Temp: 98.2 F (36.8 C)  TempSrc: Oral  SpO2: 96%  Weight: 134 lb 9.6 oz (61.1 kg)  Height: 5\' 8"  (1.727 m)   Body mass index is 20.47 kg/m.  Advanced Directives 07/07/2021 02/16/2021 01/27/2021 10/04/2020 06/29/2020 01/29/2020 11/14/2019  Does Patient Have a Medical Advance Directive? Yes Yes Yes Yes No Yes Yes  Type of Paramedic of [place];Living will - [place];Living will Living will;Healthcare Power of [place];Living will [place];Living will  Does patient want to make changes to medical advance directive? - - Yes (MAU/Ambulatory/Procedural Areas - Information given) No - Patient declined - - -  Copy of [place] in Chart? No - copy requested - - - - - No - copy requested  Would patient like information on creating a medical advance directive? - - - - No - Patient declined - -    Current Medications (verified) Outpatient Encounter Medications as of 07/07/2021  Medication Sig   acetaminophen (TYLENOL) 500 MG tablet Take 1,000 mg by mouth every 6 (six) hours as needed for moderate pain or headache.    calcium-vitamin D (OSCAL WITH D) 500-200 MG-UNIT tablet Take 1 tablet by mouth daily.   docusate sodium (COLACE) 100 MG capsule Take 100 mg by mouth daily.   folic acid (FOLVITE) 937 MCG tablet Take 1 tablet (400 mcg total) by mouth daily.   ibuprofen (ADVIL,MOTRIN) 200 MG tablet Take 400 mg by mouth every 6 (six) hours as needed for headache or moderate pain.   Melatonin 5 MG TABS Take 5 mg by mouth daily as needed.   Multiple Vitamin (MULTIVITAMIN) tablet Take 1 tablet by  mouth daily.   polyethylene glycol (MIRALAX / GLYCOLAX) 17 g packet Take 17 g by mouth daily.   vitamin B-12 (CYANOCOBALAMIN) 100 MCG tablet Take 100 mcg by mouth daily.   vitamin C (ASCORBIC ACID) 500 MG tablet Take 500 mg by mouth daily.   NONFORMULARY OR COMPOUNDED ITEM Super Trimix (30/2/20)-(Pap/Phent/PGE)  Dosage: Inject 0.5 cc per injection Titrate up to 1cc as needed   Vial 41ml  Qty #10 Refills 6  [place] [phone] Fax [phone] (Patient not taking: Reported on 07/07/2021)   [DISCONTINUED] aspirin 81 MG chewable tablet CHEW ONE TABLET BY MOUTH ONCE EVERY DAY   [DISCONTINUED] mirabegron ER (MYRBETRIQ) 25 MG TB24 tablet Take 1 tablet (25 mg total) by mouth daily.   No facility-administered encounter medications on file as of 07/07/2021.    Allergies (verified) Aspirin and Penicillins   History: Past Medical History:  Diagnosis Date   Beta thalassemia trait 09/27/2018   Cancer ([place]) 10/2017   Prostate   DDD (degenerative disc disease), lumbar 06/13/2017   Lumbar imaging June 2018   Elevated PSA 09/12/2017   Refer to urologist, Sept 2018   Facet hypertrophy of lumbar region 06/13/2017   Lumbar imaging June 2018   GERD (gastroesophageal reflux disease)    occ no meds   Hyperlipidemia    Past Surgical History:  Procedure Laterality Date   CATARACT EXTRACTION W/ INTRAOCULAR LENS IMPLANT Left 3/ 28/2014  eyes   CATARACT EXTRACTION W/ INTRAOCULAR LENS IMPLANT Right 04/23/2013   COLONOSCOPY WITH PROPOFOL N/A 07/29/2019   Procedure: COLONOSCOPY WITH PROPOFOL;  Surgeon: Lin Landsman, MD;  [facility] ENDOSCOPY;  Service: Gastroenterology;  Laterality: N/A;   CYSTOSCOPY W/ RETROGRADES Bilateral 02/21/2021   Procedure: CYSTOSCOPY WITH RETROGRADE PYELOGRAM;  Surgeon: [name], MD;  Location: [facility] ORS;  Service: Urology;  Laterality: Bilateral;   CYSTOSCOPY WITH BIOPSY N/A 02/21/2021   Procedure: CYSTOSCOPY WITH BIOPSY and fulgeration;  Surgeon:  [name], MD;  Location: [facility] ORS;  Service: Urology;  Laterality: N/A;   FLEXIBLE SIGMOIDOSCOPY N/A 11/14/2019   Procedure: FLEXIBLE SIGMOIDOSCOPY;  Surgeon: Lin Landsman, MD;  Location: [facility] ENDOSCOPY;  Service: Gastroenterology;  Laterality: N/A;   PELVIC LYMPH NODE DISSECTION N/A 12/10/2017   Procedure: PELVIC LYMPH NODE DISSECTION;  Surgeon: [name], MD;  Location: [facility] ORS;  Service: Urology;  Laterality: N/A;   PROSTATE SURGERY     ROBOT ASSISTED LAPAROSCOPIC RADICAL PROSTATECTOMY N/A 12/10/2017   Procedure: ROBOTIC ASSISTED LAPAROSCOPIC RADICAL PROSTATECTOMY;  Surgeon: [name], MD;  Location: [facility] ORS;  Service: Urology;  Laterality: N/A;   Family History  Problem Relation Age of Onset   Stroke Mother    Stroke Father    Cancer Sister        breast, lung   Pulmonary Hypertension Sister    Cancer Son        prostate   Prostate cancer Son    Hypertension Sister    Bladder Cancer Neg Hx    Kidney cancer Neg Hx    Social History   Socioeconomic History   Marital status: Divorced    Spouse name: Not on file   Number of children: 4   Years of education: Not on file   Highest education level: Associate degree: academic program  Occupational History   Occupation: Retired  Tobacco Use   Smoking status: Some Days    Types: Cigars   Smokeless tobacco: Never   Tobacco comments:    occasional cigar  Vaping Use   Vaping Use: Never used  Substance and Sexual Activity   Alcohol use: Yes    Alcohol/week: 0.0 standard drinks    Comment: beer rarely   Drug use: No    Comment: used marijuana more than 10 years ago   Sexual activity: Not Currently  Other Topics Concern   Not on file  Social History Narrative   Lives alone. Patient just buried his baby sister, Diane,10/2018   Social Determinants of Health   Financial Resource Strain: Low Risk    Difficulty of Paying Living Expenses: Not hard at all  Food Insecurity: No Food Insecurity    Worried About Charity fundraiser in the Last Year: Never true   Arboriculturist in the Last Year: Never true  Transportation Needs: No Transportation Needs   Lack of Transportation (Medical): No   Lack of Transportation (Non-Medical): No  Physical Activity: Sufficiently Active   Days of Exercise per Week: 7 days   Minutes of Exercise per Session: 60 min  Stress: No Stress Concern Present   Feeling of Stress : Only a little  Social Connections: Moderately Isolated   Frequency of Communication with Friends and Family: More than three times a week   Frequency of Social Gatherings with Friends and Family: More than three times a week   Attends Religious Services: More than 4 times per year   Active Member of Clubs or Organizations: No  Attends Archivist Meetings: Never   Marital Status: Divorced    Tobacco Counseling Ready to quit: Not Answered Counseling given: Not Answered Tobacco comments: occasional cigar   Clinical Intake:  Pre-visit preparation completed: Yes  Pain : No/denies pain     BMI - recorded: 20.47 Nutritional Status: BMI of 19-24  Normal Nutritional Risks: None Diabetes: No  How often do you need to have someone help you when you read instructions, pamphlets, or other written materials from your doctor or pharmacy?: 1 - Never    Interpreter Needed?: No  Information entered by :: Clemetine Marker LPN   Activities of Daily Living In your present state of health, do you have any difficulty performing the following activities: 07/07/2021 02/16/2021  Hearing? N N  Comment declines hearing aids -  Vision? N N  Difficulty concentrating or making decisions? N N  Walking or climbing stairs? N N  Dressing or bathing? N N  Doing errands, shopping? N N  Preparing Food and eating ? N -  Using the Toilet? N -  In the past six months, have you accidently leaked urine? Y -  Comment wears pads/guards for protection -  Do you have problems with loss of  bowel control? N -  Managing your Medications? N -  Managing your Finances? N -  Housekeeping or managing your Housekeeping? N -  Some recent data might be hidden    Patient Care Team: [place] as PCP - General (Family Medicine) [name], MD as Consulting Physician (Urology) Noreene Filbert, MD as Consulting Physician (Radiation Oncology)  Indicate any recent Medical Services you may have received from other than [facility] providers in the past year (date may be approximate).     Assessment:   This is a routine wellness examination for [place].  Hearing/Vision screen Hearing Screening - Comments:: Pt denies hearing difficulty  Vision Screening - Comments:: Pt is past due for eye exam; plans to establish care with new provider closer to home; previously seen at [place] issues and exercise activities discussed: Current Exercise Habits: Home exercise routine, Type of exercise: strength training/weights;walking, Time (Minutes): 60, Frequency (Times/Week): 7, Weekly Exercise (Minutes/Week): 420, Intensity: Moderate, Exercise limited by: None identified   Goals Addressed             This Visit's Progress    DIET - INCREASE WATER INTAKE   On track    Recommend to drink at least 6-8 8oz glasses of water per day.        Depression Screen PHQ 2/9 Scores 07/07/2021 12/14/2020 10/21/2020 08/09/2020 06/29/2020 06/22/2020 12/15/2019  PHQ - 2 Score 0 0 0 0 1 1 2   PHQ- 9 Score - - - 2 1 1 2     Fall Risk Fall Risk  07/07/2021 12/14/2020 10/28/2020 10/21/2020 08/09/2020  Falls in the past year? 0 0 0 0 0  Number falls in past yr: 0 0 0 0 0  Injury with Fall? 0 0 0 0 0  Risk for fall due to : No Fall Risks - - - -  Risk for fall due to: Comment - - - - -  Follow up Falls prevention discussed - - - -    FALL RISK PREVENTION PERTAINING TO THE HOME:  Any stairs in or around the home? Yes  If so, are there any without handrails? No  Home free of loose throw rugs  in walkways, pet beds, electrical cords, etc? Yes  Adequate lighting in  your home to reduce risk of falls? Yes   ASSISTIVE DEVICES UTILIZED TO PREVENT FALLS:  Life alert? No  Use of a cane, walker or w/c? No  Grab bars in the bathroom? Yes  Shower chair or bench in shower? No  Elevated toilet seat or a handicapped toilet? No   TIMED UP AND GO:  Was the test performed? Yes .  Length of time to ambulate 10 feet: 4 sec.   Gait steady and fast without use of assistive device  Cognitive Function:     6CIT Screen 06/29/2020 06/13/2019 06/07/2018  What Year? 0 points 0 points 0 points  What month? 0 points 0 points 0 points  What time? 0 points 0 points 0 points  Count back from 20 0 points 0 points 0 points  Months in reverse 0 points 0 points 0 points  Repeat phrase 0 points 0 points 8 points  Total Score 0 0 8    Immunizations Immunization History  Administered Date(s) Administered   Fluad Quad(high Dose 65+) 10/09/2019, 10/08/2020   Influenza, High Dose Seasonal PF 09/03/2017, 09/23/2018   Influenza, Seasonal, Injecte, Preservative Fre 09/30/2012, 10/21/2014   Influenza,inj,Quad PF,6+ Mos 09/09/2015   Influenza-Unspecified 11/17/2002, 01/06/2004, 11/22/2004, 11/06/2013   PFIZER(Purple Top)SARS-COV-2 Vaccination 02/20/2020, 03/12/2020, 05/09/2021   Pneumococcal Conjugate-13 06/13/2017   Pneumococcal Polysaccharide-23 09/30/2012, 06/24/2014   Td 06/24/2014   Tdap 01/21/2013   Zoster Recombinat (Shingrix) 07/22/2019, 12/29/2019   Zoster, Live 03/17/2013    TDAP status: Up to date  Flu Vaccine status: Up to date  Pneumococcal vaccine status: Up to date  Covid-19 vaccine status: Completed vaccines  Qualifies for Shingles Vaccine? Yes   Zostavax completed Yes   Shingrix Completed?: Yes  Screening Tests Health Maintenance  Topic Date Due   PNA vac Low Risk Adult (2 of 2 - PPSV23) 06/25/2019   INFLUENZA VACCINE  07/25/2021   COVID-19 Vaccine (4 - Booster for Pfizer  series) 08/09/2021   TETANUS/TDAP  06/24/2024   COLONOSCOPY (Pts 45-55yrs Insurance coverage will need to be confirmed)  07/28/2024   Hepatitis C Screening  Completed   Zoster Vaccines- Shingrix  Completed   HPV VACCINES  Aged Out    Health Maintenance  Health Maintenance Due  Topic Date Due   PNA vac Low Risk Adult (2 of 2 - PPSV23) 06/25/2019    Colorectal cancer screening: Type of screening: Colonoscopy. Completed 07/29/19. Repeat every 5 years  Lung Cancer Screening: (Low Dose CT Chest recommended if Age 57-80 years, 30 pack-year currently smoking OR have quit w/in 15years.) does not qualify.   Additional Screening:  Hepatitis C Screening: does qualify; Completed 09/03/17  Vision Screening: Recommended annual ophthalmology exams for early detection of glaucoma and other disorders of the eye. Is the patient up to date with their annual eye exam?  No  Who is the provider or what is the name of the office in which the patient attends annual eye exams? Not established If pt is not established with a provider, would they like to be referred to a provider to establish care? No .   Dental Screening: Recommended annual dental exams for proper oral hygiene  Community Resource Referral / Chronic Care Management: CRR required this visit?  No   CCM required this visit?  No      Plan:     I have personally reviewed and noted the following in the patient's chart:   Medical and social history Use of alcohol, tobacco or illicit drugs  Current  medications and supplements including opioid prescriptions. Patient is not currently taking opioid prescriptions. Functional ability and status Nutritional status Physical activity Advanced directives List of other physicians Hospitalizations, surgeries, and ER visits in previous 12 months Vitals Screenings to include cognitive, depression, and falls Referrals and appointments  In addition, I have reviewed and discussed with patient  certain preventive protocols, quality metrics, and best practice recommendations. A written personalized care plan for preventive services as well as general preventive health recommendations were provided to patient.     Clemetine Marker, LPN   [id number]   Nurse Notes: pt aware due for labs; plans to establish with one of the new providers starting next month. Pt doing well; will be having dental implants done next month also.

## 2021-07-07 NOTE — Patient Instructions (Signed)
Jerry Pena , Thank you for taking time to come for your Medicare Wellness Visit. I appreciate your ongoing commitment to your health goals. Please review the following plan we discussed and let me know if I can assist you in the future.   Screening recommendations/referrals: Colonoscopy: done 07/29/19. Repeat in 2025 Recommended yearly ophthalmology/optometry visit for glaucoma screening and checkup Recommended yearly dental visit for hygiene and checkup  Vaccinations: Influenza vaccine: done 10/08/20 Pneumococcal vaccine: done 06/13/17 Tdap vaccine: done 06/24/14 Shingles vaccine: done 07/22/19 & 12/29/19   Covid-19: done 02/20/20, 03/12/20 & 05/09/21; please bring vaccine card to next appt for 1st booster dose information  Advanced directives: Please bring a copy of your health care power of attorney and living will to the office at your convenience.   Conditions/risks identified: Keep up the great work!  Next appointment: Follow up in one year for your annual wellness visit.   Preventive Care 69 Years and Older, Male Preventive care refers to lifestyle choices and visits with your health care provider that can promote health and wellness. What does preventive care include? A yearly physical exam. This is also called an annual well check. Dental exams once or twice a year. Routine eye exams. Ask your health care provider how often you should have your eyes checked. Personal lifestyle choices, including: Daily care of your teeth and gums. Regular physical activity. Eating a healthy diet. Avoiding tobacco and drug use. Limiting alcohol use. Practicing safe sex. Taking low doses of aspirin every day. Taking vitamin and mineral supplements as recommended by your health care provider. What happens during an annual well check? The services and screenings done by your health care provider during your annual well check will depend on your age, overall health, lifestyle risk factors, and family  history of disease. Counseling  Your health care provider may ask you questions about your: Alcohol use. Tobacco use. Drug use. Emotional well-being. Home and relationship well-being. Sexual activity. Eating habits. History of falls. Memory and ability to understand (cognition). Work and work Statistician. Screening  You may have the following tests or measurements: Height, weight, and BMI. Blood pressure. Lipid and cholesterol levels. These may be checked every 5 years, or more frequently if you are over 54 years old. Skin check. Lung cancer screening. You may have this screening every year starting at age 55 if you have a 30-pack-year history of smoking and currently smoke or have quit within the past 15 years. Fecal occult blood test (FOBT) of the stool. You may have this test every year starting at age 28. Flexible sigmoidoscopy or colonoscopy. You may have a sigmoidoscopy every 5 years or a colonoscopy every 10 years starting at age 47. Prostate cancer screening. Recommendations will vary depending on your family history and other risks. Hepatitis C blood test. Hepatitis B blood test. Sexually transmitted disease (STD) testing. Diabetes screening. This is done by checking your blood sugar (glucose) after you have not eaten for a while (fasting). You may have this done every 1-3 years. Abdominal aortic aneurysm (AAA) screening. You may need this if you are a current or former smoker. Osteoporosis. You may be screened starting at age 83 if you are at high risk. Talk with your health care provider about your test results, treatment options, and if necessary, the need for more tests. Vaccines  Your health care provider may recommend certain vaccines, such as: Influenza vaccine. This is recommended every year. Tetanus, diphtheria, and acellular pertussis (Tdap, Td) vaccine. You may need  a Td booster every 10 years. Zoster vaccine. You may need this after age 25. Pneumococcal  13-valent conjugate (PCV13) vaccine. One dose is recommended after age 40. Pneumococcal polysaccharide (PPSV23) vaccine. One dose is recommended after age 71. Talk to your health care provider about which screenings and vaccines you need and how often you need them. This information is not intended to replace advice given to you by your health care provider. Make sure you discuss any questions you have with your health care provider. Document Released: 01/07/2016 Document Revised: 08/30/2016 Document Reviewed: 10/12/2015 Elsevier Interactive Patient Education  2017 [place] [employer] Prevention in the Home Falls can cause injuries. They can happen to people of all ages. There are many things you can do to make your home safe and to help prevent falls. What can I do on the outside of my home? Regularly fix the edges of walkways and driveways and fix any cracks. Remove anything that might make you trip as you walk through a door, such as a raised step or threshold. Trim any bushes or trees on the path to your home. Use bright outdoor lighting. Clear any walking paths of anything that might make someone trip, such as rocks or tools. Regularly check to see if handrails are loose or broken. Make sure that both sides of any steps have handrails. Any raised decks and porches should have guardrails on the edges. Have any leaves, snow, or ice cleared regularly. Use sand or salt on walking paths during winter. Clean up any spills in your garage right away. This includes oil or grease spills. What can I do in the bathroom? Use night lights. Install grab bars by the toilet and in the tub and shower. Do not use towel bars as grab bars. Use non-skid mats or decals in the tub or shower. If you need to sit down in the shower, use a plastic, non-slip stool. Keep the floor dry. Clean up any water that spills on the floor as soon as it happens. Remove soap buildup in the tub or shower regularly. Attach  bath mats securely with double-sided non-slip rug tape. Do not have throw rugs and other things on the floor that can make you trip. What can I do in the bedroom? Use night lights. Make sure that you have a light by your bed that is easy to reach. Do not use any sheets or blankets that are too big for your bed. They should not hang down onto the floor. Have a firm chair that has side arms. You can use this for support while you get dressed. Do not have throw rugs and other things on the floor that can make you trip. What can I do in the kitchen? Clean up any spills right away. Avoid walking on wet floors. Keep items that you use a lot in easy-to-reach places. If you need to reach something above you, use a strong step stool that has a grab bar. Keep electrical cords out of the way. Do not use floor polish or wax that makes floors slippery. If you must use wax, use non-skid floor wax. Do not have throw rugs and other things on the floor that can make you trip. What can I do with my stairs? Do not leave any items on the stairs. Make sure that there are handrails on both sides of the stairs and use them. Fix handrails that are broken or loose. Make sure that handrails are as long as the stairways. Check  any carpeting to make sure that it is firmly attached to the stairs. Fix any carpet that is loose or worn. Avoid having throw rugs at the top or bottom of the stairs. If you do have throw rugs, attach them to the floor with carpet tape. Make sure that you have a light switch at the top of the stairs and the bottom of the stairs. If you do not have them, ask someone to add them for you. What else can I do to help prevent falls? Wear shoes that: Do not have high heels. Have rubber bottoms. Are comfortable and fit you well. Are closed at the toe. Do not wear sandals. If you use a stepladder: Make sure that it is fully opened. Do not climb a closed stepladder. Make sure that both sides of the  stepladder are locked into place. Ask someone to hold it for you, if possible. Clearly mark and make sure that you can see: Any grab bars or handrails. First and last steps. Where the edge of each step is. Use tools that help you move around (mobility aids) if they are needed. These include: Canes. Walkers. Scooters. Crutches. Turn on the lights when you go into a dark area. Replace any light bulbs as soon as they burn out. Set up your furniture so you have a clear path. Avoid moving your furniture around. If any of your floors are uneven, fix them. If there are any pets around you, be aware of where they are. Review your medicines with your doctor. Some medicines can make you feel dizzy. This can increase your chance of falling. Ask your doctor what other things that you can do to help prevent falls. This information is not intended to replace advice given to you by your health care provider. Make sure you discuss any questions you have with your health care provider. Document Released: 10/07/2009 Document Revised: 05/18/2016 Document Reviewed: 01/15/2015 Elsevier Interactive Patient Education  2017 [employer].

## 2021-09-06 ENCOUNTER — Other Ambulatory Visit: Payer: Self-pay

## 2021-09-13 ENCOUNTER — Ambulatory Visit: Payer: Self-pay | Admitting: Urology

## 2021-09-27 ENCOUNTER — Other Ambulatory Visit: Payer: Self-pay

## 2021-09-30 ENCOUNTER — Encounter: Payer: Self-pay | Admitting: Urology

## 2021-10-04 ENCOUNTER — Ambulatory Visit: Payer: Self-pay | Admitting: Urology

## 2021-10-07 ENCOUNTER — Encounter: Payer: Self-pay | Admitting: Urology

## 2021-10-20 ENCOUNTER — Ambulatory Visit (INDEPENDENT_AMBULATORY_CARE_PROVIDER_SITE_OTHER): Payer: Medicare HMO

## 2021-10-20 ENCOUNTER — Other Ambulatory Visit: Payer: Self-pay

## 2021-10-20 ENCOUNTER — Other Ambulatory Visit: Payer: Medicare HMO

## 2021-10-20 DIAGNOSIS — Z23 Encounter for immunization: Secondary | ICD-10-CM | POA: Diagnosis not present

## 2021-10-20 DIAGNOSIS — C61 Malignant neoplasm of prostate: Secondary | ICD-10-CM | POA: Diagnosis not present

## 2021-10-20 NOTE — Progress Notes (Signed)
Vaccination tolerated well with no immediate adverse reaction noted, Patient verbalized understanding and intent to give our office a call with any questions or concerns or report to the nearest emergency department with any symptoms of allergic reaction.

## 2021-10-21 LAB — PSA: Prostate Specific Ag, Serum: <0.1 ng/mL (ref 0.0–4.0)

## 2021-10-25 NOTE — Progress Notes (Signed)
10/26/21 9:02 AM   [name] 1951-05-23 [id number]  Referring provider:  [facility], [address] [place],  [place] [address] Chief Complaint  Patient presents with   Prostate Cancer     HPI: Jerry Pena is a 70 y.o.male with a personal history of prostate cancer, gross hematuria, radiation cystitis, and stress incontinence of urine, who presents today for 6 month follow-up with PSA.   He is s/p prostatectomy/salvage XRT. Surgical pathology consistent with Gleason 4+3 prostate cancer since of extraprostatic extension but negative margins.  In addition to this, 1 of 4 nodes from the right pelvic lymph nodes including external iliac and obturator positive, 0 of 2 on the left. pT3a N1 Mx.  Ultimately, his PSA began to rise up to 0.2 and he underwent whole pelvic radiation.    He underwent a biopsy on 2/282022 that revealed pathology consistent with radiation cystitis.   He reports that he had an episode of gross hematuria with dysuria 2 days ago. He reports it happens when he takes any form of supplements or medication, as well as when he is active. He reports that he has been taking AZO.  He is becoming increasingly bothered by these episodes.    PMH: Past Medical History:  Diagnosis Date   Beta thalassemia trait 09/27/2018   Cancer ([place]) 10/2017   Prostate   DDD (degenerative disc disease), lumbar 06/13/2017   Lumbar imaging June 2018   Elevated PSA 09/12/2017   Refer to urologist, Sept 2018   Facet hypertrophy of lumbar region 06/13/2017   Lumbar imaging June 2018   GERD (gastroesophageal reflux disease)    occ no meds   Hyperlipidemia     Surgical History: Past Surgical History:  Procedure Laterality Date   CATARACT EXTRACTION W/ INTRAOCULAR LENS IMPLANT Left 3/ 28/2014   eyes   CATARACT EXTRACTION W/ INTRAOCULAR LENS IMPLANT Right 04/23/2013   COLONOSCOPY WITH PROPOFOL N/A 07/29/2019   Procedure: COLONOSCOPY WITH PROPOFOL;   Surgeon: Lin Landsman, MD;  Location: [place];  Service: Gastroenterology;  Laterality: N/A;   CYSTOSCOPY W/ RETROGRADES Bilateral 02/21/2021   Procedure: CYSTOSCOPY WITH RETROGRADE PYELOGRAM;  Surgeon: [name], MD;  Location: [facility];  Service: Urology;  Laterality: Bilateral;   CYSTOSCOPY WITH BIOPSY N/A 02/21/2021   Procedure: CYSTOSCOPY WITH BIOPSY and fulgeration;  Surgeon: [name], MD;  Location: [facility];  Service: Urology;  Laterality: N/A;   FLEXIBLE SIGMOIDOSCOPY N/A 11/14/2019   Procedure: FLEXIBLE SIGMOIDOSCOPY;  Surgeon: Lin Landsman, MD;  Location: [facility];  Service: Gastroenterology;  Laterality: N/A;   PELVIC LYMPH NODE DISSECTION N/A 12/10/2017   Procedure: PELVIC LYMPH NODE DISSECTION;  Surgeon: [name], MD;  Location: [facility];  Service: Urology;  Laterality: N/A;   PROSTATE SURGERY     ROBOT ASSISTED LAPAROSCOPIC RADICAL PROSTATECTOMY N/A 12/10/2017   Procedure: ROBOTIC ASSISTED LAPAROSCOPIC RADICAL PROSTATECTOMY;  Surgeon: [name], MD;  Location: [facility];  Service: Urology;  Laterality: N/A;    Home Medications:  Allergies as of 10/26/2021       Reactions   Aspirin Other (See Comments)   Upset stomach, indigestion and heart burn   Penicillins Hives, Other (See Comments)   Has patient had a PCN reaction causing immediate rash, facial/tongue/throat swelling, SOB or lightheadedness with hypotension: Yes Has patient had a PCN reaction causing severe rash involving mucus membranes or skin necrosis: No Has patient had a PCN reaction that required hospitalization: Yes Has patient had a  PCN reaction occurring within the last 10 years: No If all of the above answers are "NO", then may proceed with Cephalosporin use.        Medication List        Accurate as of October 26, 2021  9:02 AM. If you have any questions, ask your nurse or doctor.          acetaminophen 500 MG tablet Commonly known as:  TYLENOL Take 1,000 mg by mouth every 6 (six) hours as needed for moderate pain or headache.   calcium-vitamin D 500-200 MG-UNIT tablet Commonly known as: OSCAL WITH D Take 1 tablet by mouth daily.   docusate sodium 100 MG capsule Commonly known as: COLACE Take 100 mg by mouth daily.   folic acid 878 MCG tablet Commonly known as: FOLVITE Take 1 tablet (400 mcg total) by mouth daily.   ibuprofen 200 MG tablet Commonly known as: ADVIL Take 400 mg by mouth every 6 (six) hours as needed for headache or moderate pain.   melatonin 5 MG Tabs Take 5 mg by mouth daily as needed.   multivitamin tablet Take 1 tablet by mouth daily.   NONFORMULARY OR COMPOUNDED ITEM Super Trimix (30/2/20)-(Pap/Phent/PGE)  Dosage: Inject 0.5 cc per injection Titrate up to 1cc as needed   Vial 65ml  Qty #10 Refills 6  [place] [phone] Fax [phone]   polyethylene glycol 17 g packet Commonly known as: MIRALAX / GLYCOLAX Take 17 g by mouth daily.   vitamin B-12 100 MCG tablet Commonly known as: CYANOCOBALAMIN Take 100 mcg by mouth daily.   vitamin C 500 MG tablet Commonly known as: ASCORBIC ACID Take 500 mg by mouth daily.        Allergies:  Allergies  Allergen Reactions   Aspirin Other (See Comments)    Upset stomach, indigestion and heart burn   Penicillins Hives and Other (See Comments)    Has patient had a PCN reaction causing immediate rash, facial/tongue/throat swelling, SOB or lightheadedness with hypotension: Yes Has patient had a PCN reaction causing severe rash involving mucus membranes or skin necrosis: No Has patient had a PCN reaction that required hospitalization: Yes Has patient had a PCN reaction occurring within the last 10 years: No If all of the above answers are "NO", then may proceed with Cephalosporin use.     Family History: Family History  Problem Relation Age of Onset   Stroke Mother    Stroke Father    Cancer Sister        breast,  lung   Pulmonary Hypertension Sister    Cancer Son        prostate   Prostate cancer Son    Hypertension Sister    Bladder Cancer Neg Hx    Kidney cancer Neg Hx     Social History:  reports that he has been smoking cigars. He has never used smokeless tobacco. He reports current alcohol use. He reports that he does not use drugs.   Physical Exam: BP 135/81   Pulse (!) 105   Ht 5\' 8"  (1.727 m)   Wt 132 lb (59.9 kg)   BMI 20.07 kg/m   Constitutional:  Alert and oriented, No acute distress. HEENT: [place] AT, moist mucus membranes.  Trachea midline, no masses. Cardiovascular: No clubbing, cyanosis, or edema. Respiratory: Normal respiratory effort, no increased work of breathing. Skin: No rashes, bruises or suspicious lesions. Neurologic: Grossly intact, no focal deficits, moving all 4 extremities. Psychiatric: Normal mood and affect.  Laboratory Data:  Lab Results  Component Value Date   CREATININE 1.02 02/17/2021    Urinalysis Pending   Assessment & Plan:    Prostate cancer ([place]) - Remains NED  - S/p prostatectomy with salvage radiation and ADT   2. Radiation Cystitis  - Episode 2 day ago along with dyuria  - Urine sent for culture today to rule out infection  -Giving increasing bother and frequency of these of bleeding events, he is ready to consider hyperbaric oxygen treatments.  I provided him with some literature.  Referral sent today to wound clinic.  3. Stress incontinence  - Stable  - I recommend holding off on consideration of sphincter or intervention in the setting of recurrent gross hematuria and radiation cystitis issues.    Follow-up in 6 months for PSA   I,Kailey Littlejohn,acting as a scribe for [name], MD.,have documented all relevant documentation on the behalf of [name], MD,as directed by  [name], MD while in the presence of [name], MD.  I have reviewed the above documentation for accuracy and completeness, and I agree  with the above.   [name], MD  [facility] [address], [place], [place] [address] [phone]

## 2021-10-26 ENCOUNTER — Ambulatory Visit (INDEPENDENT_AMBULATORY_CARE_PROVIDER_SITE_OTHER): Payer: Medicare HMO | Admitting: Urology

## 2021-10-26 ENCOUNTER — Other Ambulatory Visit: Payer: Self-pay

## 2021-10-26 ENCOUNTER — Encounter: Payer: Self-pay | Admitting: Urology

## 2021-10-26 VITALS — BP 135/81 | HR 105 | Ht 68.0 in | Wt 132.0 lb

## 2021-10-26 DIAGNOSIS — R310 Gross hematuria: Secondary | ICD-10-CM

## 2021-10-26 DIAGNOSIS — N3040 Irradiation cystitis without hematuria: Secondary | ICD-10-CM | POA: Diagnosis not present

## 2021-10-26 LAB — URINALYSIS, COMPLETE
Bilirubin, UA: NEGATIVE
Glucose, UA: NEGATIVE
Nitrite, UA: POSITIVE — AB
Specific Gravity, UA: 1.025 (ref 1.005–1.030)
Urobilinogen, Ur: 0.2 mg/dL (ref 0.2–1.0)
pH, UA: 6.5 (ref 5.0–7.5)

## 2021-10-26 LAB — MICROSCOPIC EXAMINATION
RBC, Urine: >30 /hpf — ABNORMAL HIGH (ref 0–2)
WBC, UA: >30 /hpf — ABNORMAL HIGH (ref 0–5)

## 2021-10-28 ENCOUNTER — Telehealth: Payer: Self-pay | Admitting: *Deleted

## 2021-10-28 MED ORDER — SULFAMETHOXAZOLE-TRIMETHOPRIM 800-160 MG PO TABS: 1.0000 | ORAL_TABLET | Freq: Two times a day (BID) | ORAL | 0 refills | Status: DC

## 2021-10-28 NOTE — Telephone Encounter (Addendum)
Patient informed, voiced understanding. Sent in Bactrim to [facility]   ----- Message from [name], MD sent at 10/28/2021 10:07 AM EDT ----- Prelim urine culture is growing E. coli.  Lets go ahead and treat him with Bactrim DS twice daily for 7 days and we will adjust as needed on Monday if there is resistance to this drug.  [name], MD

## 2021-10-30 LAB — CULTURE, URINE COMPREHENSIVE

## 2021-11-08 ENCOUNTER — Ambulatory Visit
Admission: RE | Admit: 2021-11-08 | Discharge: 2021-11-08 | Disposition: A | Discharge status: Home / Self Care | Payer: Medicare HMO | Attending: Physician Assistant | Admitting: Physician Assistant

## 2021-11-08 ENCOUNTER — Other Ambulatory Visit: Payer: Self-pay

## 2021-11-08 ENCOUNTER — Ambulatory Visit
Admission: RE | Admit: 2021-11-08 | Discharge: 2021-11-08 | Disposition: A | Discharge status: Home / Self Care | Payer: Medicare HMO | Source: Ambulatory Visit | Attending: Physician Assistant | Admitting: Physician Assistant

## 2021-11-08 ENCOUNTER — Encounter: Payer: Medicare HMO | Attending: Physician Assistant | Admitting: Physician Assistant

## 2021-11-08 ENCOUNTER — Other Ambulatory Visit: Payer: Self-pay | Admitting: Physician Assistant

## 2021-11-08 DIAGNOSIS — D561 Beta thalassemia: Secondary | ICD-10-CM | POA: Diagnosis not present

## 2021-11-08 DIAGNOSIS — N3041 Irradiation cystitis with hematuria: Secondary | ICD-10-CM | POA: Diagnosis not present

## 2021-11-08 DIAGNOSIS — Z8546 Personal history of malignant neoplasm of prostate: Secondary | ICD-10-CM | POA: Diagnosis not present

## 2021-11-08 DIAGNOSIS — N3040 Irradiation cystitis without hematuria: Secondary | ICD-10-CM | POA: Insufficient documentation

## 2021-11-08 DIAGNOSIS — D649 Anemia, unspecified: Secondary | ICD-10-CM | POA: Diagnosis not present

## 2021-11-11 NOTE — Progress Notes (Signed)
Jerry Pena ([id number]) Visit Report for 11/08/2021 Chief Complaint Document Details Patient Name: Jerry Pena, Jerry Pena Date of Service: 11/08/2021 8:45 AM Medical Record Number: [id number] Patient Account Number: [id number] Date of Birth/Sex: [birth year] (70 y.o. M) Treating RN: Carlene Coria Primary Care Provider: [name] Other Clinician: Referring Provider: [name] Treating Provider/Extender: Skipper Cliche in Treatment: 0 Information Obtained from: Patient Chief Complaint Irradiation cystitis Electronic Signature(s) Signed: 11/08/2021 9:30:18 AM By: Worthy Keeler PA-C Entered By: Worthy Keeler on 11/08/2021 09:30:18 Jerry Pena ([id number]) -------------------------------------------------------------------------------- HPI Details Patient Name: Jerry Pena Date of Service: 11/08/2021 8:45 AM Medical Record Number: [id number] Patient Account Number: [id number] Date of Birth/Sex: November 15, 1951 (70 y.o. M) Treating RN: Carlene Coria Primary Care Provider: [name] Other Clinician: Referring Provider: [name] Treating Provider/Extender: Skipper Cliche in Treatment: 0 History of Present Illness HPI Description: 11/08/2021 patient presents today for initial evaluation in our clinic as a referral from [facility] Dr. [name] treating provider. Subsequently the concern here is that the patient has been having issues with gross hematuria following radiation to the pelvic area due to prostate cancer. 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 Again the patient is status post prostatectomy/salvage XRT. Surgical pathology was consistent with a Gleason 4+3 prostate cancer fortunately there were negative margins. In addition to this 1 of 4 nodes from the right pelvic lymph node region including the external iliac and obturator were positive. 0 of 2 on the left. pT3a N1 Mx. The  patient had beginning on 05/30/2018. Subsequently radiation therapy was begun following this evaluation with Dr. Donella Stade. He recommended MRT radiation therapy to treat both his prostatic fossa up to 7600 cGy and his pelvic lymph nodes up to 5400 cGy using MRT dose painting technique. Subsequently the patient ended up having a total of 39 treatments. The actual treatment began on 06/17/2018. Subsequently this was stated to be planned to be curative. At this point based on what I am seeing currently it actually appears that the patient is having issues with hematuria and tells me this has been going on for at least a year. Obviously this is of concern. And on top of his wound loss issue he tells me that is also causing him issues with discomfort as well. Subsequently he was referred to me for the evaluation of hyperbaric oxygen therapy to try to help with radiation cystitis. He does occasionally smoke a cigar but has no lung problems. I do think a chest x-ray for clearance will be indicated here and the good news is the chest x-ray was negative. He did have an EKG on 02/22/2021 which looks to be okay. Other than this the patient's only other major medical problem is beta thalassemia trait. Electronic Signature(s) Signed: 11/09/2021 1:40:59 PM By: Worthy Keeler PA-C Previous Signature: 11/08/2021 4:50:54 PM Version By: Worthy Keeler PA-C Entered By: Worthy Keeler on 11/09/2021 13:40:59 Jerry Pena ([id number]) -------------------------------------------------------------------------------- Physical Exam Details Patient Name: Jerry Pena Date of Service: 11/08/2021 8:45 AM Medical Record Number: [id number] Patient Account Number: [id number] Date of Birth/Sex: 11-01-1951 (70 y.o. M) Treating RN: Carlene Coria Primary Care Provider: [name] Other Clinician: Referring Provider: [name] Treating Provider/Extender: Skipper Cliche in Treatment: 0 Constitutional sitting or  standing blood pressure is within target range for patient.. pulse regular and within target range for patient.Marland Kitchen respirations regular, non- labored and within target range for patient.Marland Kitchen temperature within target  range for patient.. Well-nourished and well-hydrated in no acute distress. Eyes conjunctiva clear no eyelid edema noted. pupils equal round and reactive to light and accommodation. Ears, Nose, Mouth, and Throat no gross abnormality of ear auricles or external auditory canals. normal hearing noted during conversation. mucus membranes moist. Respiratory normal breathing without difficulty. Cardiovascular regular rate and rhythm with normal S1, S2. Musculoskeletal normal gait and posture. no significant deformity or arthritic changes, no loss or range of motion, no clubbing. Psychiatric this patient is able to make decisions and demonstrates good insight into disease process. Alert and Oriented x 3. pleasant and cooperative. Notes Again on evaluation there really was not much from a physical exam perspective that I could do here and the good news is his blood pressure is in good shape and also any issues in that regard. Again of note he is having daily symptoms of hematuria which is what we are aiming to improve with regard to hyperbaric oxygen therapy. The goal is to hopefully eliminate this but at minimum to mitigate the amount of blood that he is losing daily. Electronic Signature(s) Signed: 11/09/2021 1:41:59 PM By: Worthy Keeler PA-C Entered By: Worthy Keeler on 11/09/2021 13:41:59 Jerry Pena ([id number]) -------------------------------------------------------------------------------- Physician Orders Details Patient Name: Jerry Pena Date of Service: 11/08/2021 8:45 AM Medical Record Number: [id number] Patient Account Number: [id number] Date of Birth/Sex: 04/27/1951 (70 y.o. M) Treating RN: Carlene Coria Primary Care Provider: [name] Other  Clinician: Referring Provider: [name] Treating Provider/Extender: Skipper Cliche in Treatment: 0 Verbal / Phone Orders: No Diagnosis Coding ICD-10 Coding Code Description N30.41 Irradiation cystitis with hematuria Z85.46 Personal history of malignant neoplasm of prostate D56.1 Beta thalassemia Follow-up Appointments o Other: - HBO tech to notify Hyperbaric Oxygen Therapy o Evaluate for HBO Therapy o Indication and location: - radiation cystitis o If appropriate for treatment, begin HBOT per protocol: o 2.5 ATA for 90 Minutes with 2 Five (5) Minute Air Breaks o One treatment per day (delivered Monday through Friday unless otherwise specified in Special Instructions below): o Total # of Treatments: - 40 o Antihistamine 30 minutes prior to HBO Treatment, difficulty clearing ears. Radiology o X-ray, Chest - HBO profile Electronic Signature(s) Signed: 11/09/2021 1:43:22 PM By: Worthy Keeler PA-C Previous Signature: 11/08/2021 4:51:13 PM Version By: Worthy Keeler PA-C Entered By: Worthy Keeler on 11/09/2021 13:43:22 JENNA, ARDOIN ([id number]) -------------------------------------------------------------------------------- Problem List Details Patient Name: Jerry Pena Date of Service: 11/08/2021 8:45 AM Medical Record Number: [id number] Patient Account Number: [id number] Date of Birth/Sex: 10/06/1951 (70 y.o. M) Treating RN: Carlene Coria Primary Care Provider: [name] Other Clinician: Referring Provider: [name] Treating Provider/Extender: Skipper Cliche in Treatment: 0 Active Problems ICD-10 Encounter Code Description Active Date MDM Diagnosis N30.41 Irradiation cystitis with hematuria 11/08/2021 No Yes Z85.46 Personal history of malignant neoplasm of prostate 11/08/2021 No Yes D56.1 Beta thalassemia 11/08/2021 No Yes Inactive Problems Resolved Problems Electronic Signature(s) Signed: 11/08/2021 9:29:38 AM By:  Worthy Keeler PA-C Entered By: Worthy Keeler on 11/08/2021 09:29:38 Jerry Pena ([id number]) -------------------------------------------------------------------------------- Progress Note Details Patient Name: Jerry Pena Date of Service: 11/08/2021 8:45 AM Medical Record Number: [id number] Patient Account Number: [id number] Date of Birth/Sex: 11/02/1951 (70 y.o. M) Treating RN: Carlene Coria Primary Care Provider: [name] Other Clinician: Referring Provider: [name] Treating Provider/Extender: Skipper Cliche in Treatment: 0 Subjective Chief Complaint Information obtained from Patient Irradiation cystitis History of Present Illness (HPI) 11/08/2021 patient presents today  for initial evaluation in our clinic as a referral from [facility] Dr. [name] treating provider. Subsequently the concern here is that the patient has been having issues with gross hematuria following radiation to the pelvic area due to prostate cancer. 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 Again the patient is status post prostatectomy/salvage XRT. Surgical pathology was consistent with a Gleason 4+3 prostate cancer fortunately there were negative margins. In addition to this 1 of 4 nodes from the right pelvic lymph node region including the external iliac and obturator were positive. 0 of 2 on the left. pT3a N1 Mx. The patient had beginning on 05/30/2018. Subsequently radiation therapy was begun following this evaluation with Dr. Donella Stade. He recommended MRT radiation therapy to treat both his prostatic fossa up to 7600 cGy and his pelvic lymph nodes up to 5400 cGy using MRT dose painting technique. Subsequently the patient ended up having a total of 39 treatments. The actual treatment began on 06/17/2018. Subsequently this was stated to be planned to be curative. At this point based on what I am seeing currently it  actually appears that the patient is having issues with hematuria and tells me this has been going on for at least a year. Obviously this is of concern. And on top of his wound loss issue he tells me that is also causing him issues with discomfort as well. Subsequently he was referred to me for the evaluation of hyperbaric oxygen therapy to try to help with radiation cystitis. He does occasionally smoke a cigar but has no lung problems. I do think a chest x-ray for clearance will be indicated here and the good news is the chest x-ray was negative. He did have an EKG on 02/22/2021 which looks to be okay. Other than this the patient's only other major medical problem is beta thalassemia trait. Patient History Information obtained from Patient. Allergies aspirin, penicillin Social History Current some day smoker, Marital Status - Divorced, Alcohol Use - Rarely, Drug Use - No History, Caffeine Use - Rarely. Medical History Hematologic/Lymphatic Patient has history of Anemia Oncologic Patient has history of Received Radiation Denies history of Received Chemotherapy Medical And Surgical History Notes Oncologic august 2019 Review of Systems (ROS) Oncologic bladder Psychiatric Denies complaints or symptoms of Anxiety, Claustrophobia. Objective Constitutional sitting or standing blood pressure is within target range for patient.. pulse regular and within target range for patient.Marland Kitchen respirations regular, non- labored and within target range for patient.Marland Kitchen temperature within target range for patient.. Well-nourished and well-hydrated in no acute distress. ZARIUS, FURR ([id number]) Vitals Time Taken: 9:12 AM, Height: 68 in, Source: Stated, Weight: 140 lbs, Source: Measured, BMI: 21.3, Temperature: 97.9 F, Pulse: 86 bpm, Respiratory Rate: 16 breaths/min, Blood Pressure: 108/68 mmHg. Eyes conjunctiva clear no eyelid edema noted. pupils equal round and reactive to light and accommodation. Ears,  Nose, Mouth, and Throat no gross abnormality of ear auricles or external auditory canals. normal hearing noted during conversation. mucus membranes moist. Respiratory normal breathing without difficulty. Cardiovascular regular rate and rhythm with normal S1, S2. Musculoskeletal normal gait and posture. no significant deformity or arthritic changes, no loss or range of motion, no clubbing. Psychiatric this patient is able to make decisions and demonstrates good insight into disease process. Alert and Oriented x 3. pleasant and cooperative. General Notes: Again on evaluation there really was not much from a physical exam perspective that I could do here and the good news is his  blood pressure is in good shape and also any issues in that regard. Again of note he is having daily symptoms of hematuria which is what we are aiming to improve with regard to hyperbaric oxygen therapy. The goal is to hopefully eliminate this but at minimum to mitigate the amount of blood that he is losing daily. Assessment Active Problems ICD-10 Irradiation cystitis with hematuria Personal history of malignant neoplasm of prostate Beta thalassemia Plan Follow-up Appointments: Other: - HBO tech to notify Hyperbaric Oxygen Therapy: Evaluate for HBO Therapy Indication and location: - radiation cystitis If appropriate for treatment, begin HBOT per protocol: 2.5 ATA for 90 Minutes with 2 Five (5) Minute Air Breaks One treatment per day (delivered Monday through Friday unless otherwise specified in Special Instructions below): Total # of Treatments: - 40 Antihistamine 30 minutes prior to HBO Treatment, difficulty clearing ears. Radiology ordered were: X-ray, Chest - HBO profile 1. I am going to suggest that we go ahead and proceed with hyperbaric oxygen therapy. I do believe that the patient would be initiated currently at 2.5 ATA with two 5-minute air breaks. This will be it for a total of 40 treatments.  Fortunately he is not diabetic so we do not have to follow diabetic protocol in this regard. 2. I am also can recommend based on what was seen currently that we monitor him throughout the course of treatment to ensure that he is seeing improvements I explained that often around the midway mark patient's will start to see improvement if not even before then. Nonetheless we will definitely keep an eye on things and he will of course be following up with his urologist as well. That is Dr. [name] at [facility]. We will see the patient back to initiate treatment as soon as possible. KYEN, TAITE ([id number]) Electronic Signature(s) Signed: 11/09/2021 1:43:36 PM By: Worthy Keeler PA-C Entered By: Worthy Keeler on 11/09/2021 13:43:36 SIVAN, QUAST ([id number]) -------------------------------------------------------------------------------- ROS/PFSH Details Patient Name: Jerry Pena Date of Service: 11/08/2021 8:45 AM Medical Record Number: [id number] Patient Account Number: [id number] Date of Birth/Sex: April 01, 1951 (70 y.o. M) Treating RN: Carlene Coria Primary Care Provider: [name] Other Clinician: Referring Provider: [name] Treating Provider/Extender: Skipper Cliche in Treatment: 0 Information Obtained From Patient Psychiatric Complaints and Symptoms: Negative for: Anxiety; Claustrophobia Hematologic/Lymphatic Medical History: Positive for: Anemia Oncologic Complaints and Symptoms: Review of System Notes: bladder Medical History: Positive for: Received Radiation Negative for: Received Chemotherapy Past Medical History Notes: august 2019 Immunizations Pneumococcal Vaccine: Received Pneumococcal Vaccination: No Implantable Devices None Family and Social History Current some day smoker; Marital Status - Divorced; Alcohol Use: Rarely; Drug Use: No History; Caffeine Use: Rarely; Financial Concerns: No; Food, Clothing or  Shelter Needs: No; Support System Lacking: No; Transportation Concerns: No Electronic Signature(s) Signed: 11/08/2021 4:51:13 PM By: Worthy Keeler PA-C Signed: 11/11/2021 10:13:49 AM By: Carlene Coria RN Entered By: Carlene Coria on 11/08/2021 09:16:47 Jerry Pena ([id number]) -------------------------------------------------------------------------------- SuperBill Details Patient Name: Jerry Pena Date of Service: 11/08/2021 Medical Record Number: [id number] Patient Account Number: [id number] Date of Birth/Sex: [birth year] (70 y.o. M) Treating RN: Carlene Coria Primary Care Provider: [name] Other Clinician: Referring Provider: [name] Treating Provider/Extender: Skipper Cliche in Treatment: 0 Diagnosis Coding ICD-10 Codes Code Description N30.41 Irradiation cystitis with hematuria Z85.46 Personal history of malignant neoplasm of prostate D56.1 Beta thalassemia Facility Procedures CPT4 Code: 01007121 Description: 97588 - WOUND CARE VISIT-LEV 2 EST PT Modifier: Quantity: 1 Physician  Procedures CPT4 Code: 8307354 Description: 30148 - WC PHYS LEVEL 4 - NEW PT Modifier: Quantity: 1 CPT4 Code: Description: ICD-10 Diagnosis Description N30.41 Irradiation cystitis with hematuria Z85.46 Personal history of malignant neoplasm of prostate D56.1 Beta thalassemia Modifier: Quantity: Electronic Signature(s) Signed: 11/08/2021 4:51:37 PM By: Worthy Keeler PA-C Previous Signature: 11/08/2021 4:51:13 PM Version By: Worthy Keeler PA-C Entered By: Worthy Keeler on 11/08/2021 16:51:37

## 2021-11-11 NOTE — Progress Notes (Signed)
ORLAND, VISCONTI ([id number]) Visit Report for 11/08/2021 Abuse/Suicide Risk Screen Details Patient Name: Jerry Pena, Jerry Pena Date of Service: 11/08/2021 8:45 AM Medical Record Number: [id number] Patient Account Number: [id number] Date of Birth/Sex: September 23, 1951 (70 y.o. M) Treating RN: Carlene Coria Primary Care Avianah Pellman: [name] Other Clinician: Referring Loda Bialas: [name] Treating Forbes Loll/Extender: Skipper Cliche in Treatment: 0 Abuse/Suicide Risk Screen Items Answer ABUSE RISK SCREEN: Has anyone close to you tried to hurt or harm you recentlyo No Do you feel uncomfortable with anyone in your familyo No Has anyone forced you do things that you didnot want to doo No Electronic Signature(s) Signed: 11/11/2021 10:13:49 AM By: Carlene Coria RN Entered By: Carlene Coria on 11/08/2021 09:17:01 Jerry Pena ([id number]) -------------------------------------------------------------------------------- Activities of Daily Living Details Patient Name: Jerry Pena Date of Service: 11/08/2021 8:45 AM Medical Record Number: [id number] Patient Account Number: [id number] Date of Birth/Sex: 03/01/1951 (70 y.o. M) Treating RN: Carlene Coria Primary Care Otelia Hettinger: [name] Other Clinician: Referring Cheyann Blecha: [name] Treating Casilda Pickerill/Extender: Skipper Cliche in Treatment: 0 Activities of Daily Living Items Answer Activities of Daily Living (Please select one for each item) Drive Automobile Completely Able Take Medications Completely Able Use Telephone Completely Able Care for Appearance Completely Able Use Toilet Completely Able Bath / Shower Completely Able Dress Self Completely Able Feed Self Completely Able Walk Completely Able Get In / Out Bed Completely Able Housework Completely Able Prepare Meals Completely [place] for Self Completely Able Electronic Signature(s) Signed: 11/11/2021 10:13:49 AM By: Carlene Coria  RN Entered By: Carlene Coria on 11/08/2021 09:17:24 Jerry Pena ([id number]) -------------------------------------------------------------------------------- Education Screening Details Patient Name: Jerry Pena Date of Service: 11/08/2021 8:45 AM Medical Record Number: [id number] Patient Account Number: [id number] Date of Birth/Sex: [birth year] (70 y.o. M) Treating RN: Carlene Coria Primary Care Ryker Pherigo: [name] Other Clinician: Referring Zoiee Wimmer: [name] Treating Bharat Antillon/Extender: Skipper Cliche in Treatment: 0 Primary Learner Assessed: Patient Learning Preferences/Education Level/Primary Language Learning Preference: Explanation Highest Education Level: College or Above Preferred Language: English Cognitive Barrier Language Barrier: No Translator Needed: No Memory Deficit: No Emotional Barrier: No Cultural/Religious Beliefs Affecting Medical Care: No Physical Barrier Impaired Vision: No Impaired Hearing: No Decreased Hand dexterity: No Knowledge/Comprehension Knowledge Level: Medium Comprehension Level: High Ability to understand written instructions: High Ability to understand verbal instructions: High Motivation Anxiety Level: Anxious Cooperation: Cooperative Education Importance: Acknowledges Need Interest in Health Problems: Asks Questions Perception: Coherent Willingness to Engage in Self-Management High Activities: Readiness to Engage in Self-Management High Activities: Electronic Signature(s) Signed: 11/11/2021 10:13:49 AM By: Carlene Coria RN Entered By: Carlene Coria on 11/08/2021 09:17:52 Jerry Pena ([id number]) -------------------------------------------------------------------------------- Fall Risk Assessment Details Patient Name: Jerry Pena Date of Service: 11/08/2021 8:45 AM Medical Record Number: [id number] Patient Account Number: [id number] Date of Birth/Sex: 09-Feb-1951 (70 y.o. M) Treating RN: Carlene Coria Primary Care Adelfa Lozito: [name] Other Clinician: Referring Alexis Reber: [name] Treating Shahan Starks/Extender: Skipper Cliche in Treatment: 0 Fall Risk Assessment Items Have you had 2 or more falls in the last 12 monthso 0 No Have you had any fall that resulted in injury in the last 12 monthso 0 No FALLS RISK SCREEN History of falling - immediate or within 3 months 0 No Secondary diagnosis (Do you have 2 or more medical diagnoseso) 0 No Ambulatory aid None/bed rest/wheelchair/nurse 0 No Crutches/cane/walker 0 No Furniture 0 No Intravenous therapy Access/Saline/Heparin Lock 0 No Gait/Transferring Normal/ bed rest/ wheelchair 0 No Weak (short steps with  or without shuffle, stooped but able to lift head while walking, may 0 No seek support from furniture) Impaired (short steps with shuffle, may have difficulty arising from chair, head down, impaired 0 No balance) Mental Status Oriented to own ability 0 No Electronic Signature(s) Signed: 11/11/2021 10:13:49 AM By: Carlene Coria RN Entered By: Carlene Coria on 11/08/2021 09:18:00 Jerry Pena ([id number]) -------------------------------------------------------------------------------- Foot Assessment Details Patient Name: Jerry Pena Date of Service: 11/08/2021 8:45 AM Medical Record Number: [id number] Patient Account Number: [id number] Date of Birth/Sex: 01-Jun-1951 (70 y.o. M) Treating RN: Carlene Coria Primary Care Lavella Myren: [name] Other Clinician: Referring Yelina Sarratt: [name] Treating Nathifa Ritthaler/Extender: Skipper Cliche in Treatment: 0 Foot Assessment Items Site Locations + = Sensation present, - = Sensation absent, C = Callus, U = Ulcer R = Redness, W = Warmth, M = Maceration, PU = Pre-ulcerative lesion F = Fissure, S = Swelling, D = Dryness Assessment Right: Left: Other Deformity: No No Prior Foot Ulcer: No No Prior Amputation: No No Charcot Joint: No No Ambulatory Status:  Ambulatory Without Help Gait: Steady Electronic Signature(s) Signed: 11/11/2021 10:13:49 AM By: Carlene Coria RN Entered By: Carlene Coria on 11/08/2021 09:18:22 Jerry Pena ([id number]) -------------------------------------------------------------------------------- Nutrition Risk Screening Details Patient Name: Jerry Pena Date of Service: 11/08/2021 8:45 AM Medical Record Number: [id number] Patient Account Number: [id number] Date of Birth/Sex: 02-04-1951 (70 y.o. M) Treating RN: Carlene Coria Primary Care Eryx Zane: [name] Other Clinician: Referring Arrin Ishler: [name] Treating Zoriyah Scheidegger/Extender: Skipper Cliche in Treatment: 0 Height (in): 68 Weight (lbs): 140 Body Mass Index (BMI): 21.3 Nutrition Risk Screening Items Score Screening NUTRITION RISK SCREEN: I have an illness or condition that made me change the kind and/or amount of food I eat 0 No I eat fewer than two meals per day 0 No I eat few fruits and vegetables, or milk products 0 No I have three or more drinks of beer, liquor or wine almost every day 0 No I have tooth or mouth problems that make it hard for me to eat 0 No I don't always have enough money to buy the food I need 0 No I eat alone most of the time 0 No I take three or more different prescribed or over-the-counter drugs a day 1 Yes Without wanting to, I have lost or gained 10 pounds in the last six months 0 No I am not always physically able to shop, cook and/or feed myself 0 No Nutrition Protocols Good Risk Protocol 0 No interventions needed Moderate Risk Protocol High Risk Proctocol Risk Level: Good Risk Score: 1 Electronic Signature(s) Signed: 11/11/2021 10:13:49 AM By: Carlene Coria RN Entered By: Carlene Coria on 11/08/2021 09:18:11

## 2021-11-11 NOTE — Progress Notes (Addendum)
RUTH, TULLY ([id number]) Visit Report for 11/08/2021 Allergy List Details Patient Name: Jerry Pena, Jerry Pena Date of Service: 11/08/2021 8:45 AM Medical Record Number: [id number] Patient Account Number: [id number] Date of Birth/Sex: 1951-03-09 (70 y.o. M) Treating RN: Carlene Coria Primary Care Katriana Dortch: [name] Other Clinician: Referring Eveleen Mcnear: [name] Treating Xylan Sheils/Extender: Jeri Cos Weeks in Treatment: 0 Allergies Active Allergies aspirin penicillin Allergy Notes Electronic Signature(s) Signed: 11/11/2021 10:13:49 AM By: Carlene Coria RN Entered By: Carlene Coria on 11/08/2021 09:14:23 Jerry Pena ([id number]) -------------------------------------------------------------------------------- Arrival Information Details Patient Name: Jerry Pena Date of Service: 11/08/2021 8:45 AM Medical Record Number: [id number] Patient Account Number: [id number] Date of Birth/Sex: 1951/01/14 (70 y.o. M) Treating RN: Carlene Coria Primary Care Khloei Spiker: [name] Other Clinician: Referring Mackinley Kiehn: [name] Treating Lawrencia Mauney/Extender: Skipper Cliche in Treatment: 0 Visit Information Patient Arrived: Ambulatory Arrival Time: 09:09 Accompanied By: self Transfer Assistance: None Patient Identification Verified: Yes Secondary Verification Process Completed: Yes Patient Requires Transmission-Based Precautions: No Patient Has Alerts: No Electronic Signature(s) Signed: 11/11/2021 10:13:49 AM By: Carlene Coria RN Entered By: Carlene Coria on 11/08/2021 09:12:19 Jerry Pena ([id number]) -------------------------------------------------------------------------------- Clinic Level of Care Assessment Details Patient Name: Jerry Pena Date of Service: 11/08/2021 8:45 AM Medical Record Number: [id number] Patient Account Number: [id number] Date of Birth/Sex: [birth year] (70 y.o. M) Treating RN: Carlene Coria Primary Care Anuj Summons: [name] Other  Clinician: Referring Sharifa Bucholz: [name] Treating Brennley Curtice/Extender: Skipper Cliche in Treatment: 0 Clinic Level of Care Assessment Items TOOL 2 Quantity Score X - Use when only an EandM is performed on the INITIAL visit 1 0 ASSESSMENTS - Nursing Assessment / Reassessment X - General Physical Exam (combine w/ comprehensive assessment (listed just below) when performed on new 1 20 pt. evals) X- 1 25 Comprehensive Assessment (HX, ROS, Risk Assessments, Wounds Hx, etc.) ASSESSMENTS - Wound and Skin Assessment / Reassessment []  - Simple Wound Assessment / Reassessment - one wound 0 []  - 0 Complex Wound Assessment / Reassessment - multiple wounds []  - 0 Dermatologic / Skin Assessment (not related to wound area) ASSESSMENTS - Ostomy and/or Continence Assessment and Care []  - Incontinence Assessment and Management 0 []  - 0 Ostomy Care Assessment and Management (repouching, etc.) PROCESS - Coordination of Care []  - Simple Patient / Family Education for ongoing care 0 []  - 0 Complex (extensive) Patient / Family Education for ongoing care X- 1 10 Staff obtains Programmer, systems, Records, Test Results / Process Orders []  - 0 Staff telephones HHA, Nursing Homes / Clarify orders / etc []  - 0 Routine Transfer to another Facility (non-emergent condition) []  - 0 Routine Hospital Admission (non-emergent condition) []  - 0 New Admissions / Biomedical engineer / Ordering NPWT, Apligraf, etc. []  - 0 Emergency Hospital Admission (emergent condition) X- 1 10 Simple Discharge Coordination []  - 0 Complex (extensive) Discharge Coordination PROCESS - Special Needs []  - Pediatric / Minor Patient Management 0 []  - 0 Isolation Patient Management []  - 0 Hearing / Language / Visual special needs []  - 0 Assessment of Community assistance (transportation, D/C planning, etc.) []  - 0 Additional assistance / Altered mentation []  - 0 Support Surface(s) Assessment (bed, cushion, seat,  etc.) INTERVENTIONS - Wound Cleansing / Measurement []  - Wound Imaging (photographs - any number of wounds) 0 []  - 0 Wound Tracing (instead of photographs) []  - 0 Simple Wound Measurement - one wound []  - 0 Complex Wound Measurement - multiple wounds Jester, Sakib D. ([id number]) []  - 0 Simple Wound Cleansing - one wound []  -  0 Complex Wound Cleansing - multiple wounds INTERVENTIONS - Wound Dressings []  - Small Wound Dressing one or multiple wounds 0 []  - 0 Medium Wound Dressing one or multiple wounds []  - 0 Large Wound Dressing one or multiple wounds []  - 0 Application of Medications - injection INTERVENTIONS - Miscellaneous []  - External ear exam 0 []  - 0 Specimen Collection (cultures, biopsies, blood, body fluids, etc.) []  - 0 Specimen(s) / Culture(s) sent or taken to Lab for analysis []  - 0 Patient Transfer (multiple staff / Harrel Lemon Lift / Similar devices) []  - 0 Simple Staple / Suture removal (25 or less) []  - 0 Complex Staple / Suture removal (26 or more) []  - 0 Hypo / Hyperglycemic Management (close monitor of Blood Glucose) []  - 0 Ankle / Brachial Index (ABI) - do not check if billed separately Has the patient been seen at the hospital within the last three years: Yes Total Score: 65 Level Of Care: New/Established - Level 2 Electronic Signature(s) Signed: 11/11/2021 10:13:49 AM By: Carlene Coria RN Entered By: Carlene Coria on 11/08/2021 09:38:20 Jerry Pena ([id number]) -------------------------------------------------------------------------------- Encounter Discharge Information Details Patient Name: Jerry Pena Date of Service: 11/08/2021 8:45 AM Medical Record Number: [id number] Patient Account Number: [id number] Date of Birth/Sex: 01-Jun-1951 (70 y.o. M) Treating RN: Carlene Coria Primary Care Khila Papp: [name] Other Clinician: Referring Himani Corona: [name] Treating Keigan Girten/Extender: Skipper Cliche in Treatment: 0 Encounter  Discharge Information Items Discharge Condition: Stable Ambulatory Status: Ambulatory Discharge Destination: Home Transportation: Private Auto Accompanied By: self Schedule Follow-up Appointment: Yes Clinical Summary of Care: Patient Declined Electronic Signature(s) Signed: 11/11/2021 10:13:49 AM By: Carlene Coria RN Entered By: Carlene Coria on 11/08/2021 09:39:21 Jerry Pena ([id number]) -------------------------------------------------------------------------------- Lower Extremity Assessment Details Patient Name: Jerry Pena Date of Service: 11/08/2021 8:45 AM Medical Record Number: [id number] Patient Account Number: [id number] Date of Birth/Sex: 05/20/1951 (70 y.o. M) Treating RN: Carlene Coria Primary Care Ashanty Coltrane: [name] Other Clinician: Referring Kendle Erker: [name] Treating Kirat Mezquita/Extender: Skipper Cliche in Treatment: 0 Electronic Signature(s) Signed: 11/11/2021 10:13:49 AM By: Carlene Coria RN Entered By: Carlene Coria on 11/08/2021 09:13:40 Jerry Pena ([id number]) -------------------------------------------------------------------------------- Multi Wound Chart Details Patient Name: Jerry Pena Date of Service: 11/08/2021 8:45 AM Medical Record Number: [id number] Patient Account Number: [id number] Date of Birth/Sex: October 28, 1951 (70 y.o. M) Treating RN: Carlene Coria Primary Care Andrian Sabala: [name] Other Clinician: Referring Betti Goodenow: [name] Treating Bowen Kia/Extender: Skipper Cliche in Treatment: 0 Vital Signs Height(in): 68 Pulse(bpm): 86 Weight(lbs): 140 Blood Pressure(mmHg): 108/68 Body Mass Index(BMI): 21 Temperature(F): 97.9 Respiratory Rate(breaths/min): 16 Wound Assessments Treatment Notes Electronic Signature(s) Signed: 11/11/2021 10:13:49 AM By: Carlene Coria RN Entered By: Carlene Coria on 11/08/2021 09:35:23 Jerry Pena  ([id number]) -------------------------------------------------------------------------------- [place] Details Patient Name: Jerry Pena Date of Service: 11/08/2021 8:45 AM Medical Record Number: [id number] Patient Account Number: [id number] Date of Birth/Sex: Dec 28, 1950 (70 y.o. M) Treating RN: Carlene Coria Primary Care Artin Mceuen: [name] Other Clinician: Referring Aadin Gaut: [name] Treating Charlton Boule/Extender: Skipper Cliche in Treatment: 0 Active Inactive Electronic Signature(s) Signed: 11/30/2021 9:19:46 AM By: Gretta Cool, BSN, RN, CWS, Kim RN, BSN Signed: 11/30/2021 1:44:34 PM By: Carlene Coria RN Previous Signature: 11/11/2021 10:13:49 AM Version By: Carlene Coria RN Entered By: Gretta Cool, BSN, RN, CWS, Kim on 11/30/2021 09:19:45 Jerry Pena ([id number]) -------------------------------------------------------------------------------- Pain Assessment Details Patient Name: Jerry Pena Date of Service: 11/08/2021 8:45 AM Medical Record Number: [id number] Patient Account Number: [id number] Date of Birth/Sex: 04/28/1951 (70 y.o.  M) Treating RN: Carlene Coria Primary Care Eufemia Prindle: [name] Other Clinician: Referring Aaleyah Witherow: [name] Treating Mahli Glahn/Extender: Skipper Cliche in Treatment: 0 Active Problems Location of Pain Severity and Description of Pain Patient Has Paino No Site Locations Pain Management and Medication Current Pain Management: Electronic Signature(s) Signed: 11/11/2021 10:13:49 AM By: Carlene Coria RN Entered By: Carlene Coria on 11/08/2021 09:12:54 Jerry Pena ([id number]) -------------------------------------------------------------------------------- Patient/Caregiver Education Details Patient Name: Jerry Pena Date of Service: 11/08/2021 8:45 AM Medical Record Number: [id number] Patient Account Number: [id number] Date of Birth/Gender: 12-24-1951 (70 y.o. M) Treating RN: Carlene Coria Primary Care  Physician: [name] Other Clinician: Referring Physician: [name] Treating Physician/Extender: Skipper Cliche in Treatment: 0 Education Assessment Education Provided To: Patient Education Topics Provided Welcome To [facility] [place] [place]: Methods: Explain/Verbal Responses: State content correctly Electronic Signature(s) Signed: 11/11/2021 10:13:49 AM By: Carlene Coria RN Entered By: Carlene Coria on 11/08/2021 09:38:47 Jerry Pena ([id number]) -------------------------------------------------------------------------------- Vitals Details Patient Name: Jerry Pena Date of Service: 11/08/2021 8:45 AM Medical Record Number: [id number] Patient Account Number: [id number] Date of Birth/Sex: 12-Jun-1951 (70 y.o. M) Treating RN: Carlene Coria Primary Care Elyza Whitt: [name] Other Clinician: Referring Chrstopher Malenfant: [name] Treating Crystol Walpole/Extender: Skipper Cliche in Treatment: 0 Vital Signs Time Taken: 09:12 Temperature (F): 97.9 Height (in): 68 Pulse (bpm): 86 Source: Stated Respiratory Rate (breaths/min): 16 Weight (lbs): 140 Blood Pressure (mmHg): 108/68 Source: Measured Reference Range: 80 - 120 mg / dl Body Mass Index (BMI): 21.3 Electronic Signature(s) Signed: 11/11/2021 10:13:49 AM By: Carlene Coria RN Entered By: Carlene Coria on 11/08/2021 09:13:24

## 2021-11-11 NOTE — Progress Notes (Signed)
Jerry, Pena ([id number]) Visit Report for 11/08/2021 HBO Risk Assessment Details Patient Name: Jerry Pena, CRICKENBERGER Date of Service: 11/08/2021 8:45 AM Medical Record Number: [id number] Patient Account Number: [id number] Date of Birth/Sex: 08/20/1951 (70 y.o. M) Treating RN: Jerry Pena Primary Care Jerry Pena: Jerry Pena Other Clinician: Referring Jerry Pena: Jerry Pena Treating Jerry Pena/Extender: Jerry Pena in Treatment: 0 HBO Risk Assessment Items Answer Any "Yes" answers must be brought to the hyperbaric physician's attention. Breathing or Lung problemso No Currently use tobacco productso No Used tobacco products in the pasto Yes Heart problemso No Do you take water pills (diuretic)o No Diabeteso No Dialysiso No Eye problems like glaucomao No Ear problems or surgeryo No Sinus Problemso No Cancero Yes List the location: prostate Surgery(s) for cancero Yes Describe the surgery (s): prostectomy Radiation therapy for cancero Yes List the location of the radiation therapy: abdomin Last treatment for radiation therapyo aug 2019 Chemotherapy for cancero No Confinement Anxiety (Claustrophobia- fear of confined places)o No Any medical implants/devices that are fully or partially implanted or attached to your bodyo No Pregnanto No Seizureso No Electronic Signature(s) Signed: 11/11/2021 10:13:49 AM By: Jerry Coria RN Entered By: Jerry Pena on 11/08/2021 09:20:32

## 2021-11-16 ENCOUNTER — Ambulatory Visit: Payer: Self-pay

## 2021-11-16 NOTE — Telephone Encounter (Signed)
Pt calling stating that he is to start wound care tx Monday 11/21/21 at 0800 for 5days/ week for 2 hrs in a hyperbaric chamber for wound care and he's got claustrophobia real bad and wants to see if something can be prescribed. Says that he had MRI previously and was prescribed valium to help him relax for the MRI. Scheduled pt for telephone visit on 11/21/21 at 1140 which is after his first tx d/t the holiday and office being closed. Advised him to attempt to do 1st tx without medication and Dr. Ancil Boozer will call him at his appt time and see if anything can be prescribed. Care advice given and pt verbalized understanding. No other questions/concerns noted.      Reason for Disposition  Prescription request for new medicine (not a refill)  Answer Assessment - Initial Assessment Questions 1. NAME of MEDICATION: "What medicine are you calling about?"     Pt is calling to ask if there is anything he can take for his claustrophobia d/t going to wound clinic for 5 days a week in a hyperbaric chamber 2. QUESTION: "What is your question?" (e.g., double dose of medicine, side effect)     See above 3. PRESCRIBING HCP: "Who prescribed it?" Reason: if prescribed by specialist, call should be referred to that group.     *No Answer* 4. SYMPTOMS: "Do you have any symptoms?"     Anxiety and nervousness  5. SEVERITY: If symptoms are present, ask "Are they mild, moderate or severe?"     moderate 6. PREGNANCY:  "Is there any chance that you are pregnant?" "When was your last menstrual period?"     NA  Protocols used: Medication Question Call-A-AH

## 2021-11-21 ENCOUNTER — Encounter: Payer: Medicare HMO | Admitting: Family Medicine

## 2021-11-21 ENCOUNTER — Encounter: Payer: Medicare HMO | Admitting: Physician Assistant

## 2021-11-21 NOTE — Progress Notes (Signed)
Cancelled day of visit  

## 2021-11-22 ENCOUNTER — Encounter: Payer: Medicare HMO | Admitting: Physician Assistant

## 2021-11-23 ENCOUNTER — Telehealth: Payer: Self-pay

## 2021-11-23 ENCOUNTER — Encounter: Payer: Medicare HMO | Admitting: Internal Medicine

## 2021-11-23 NOTE — Telephone Encounter (Signed)
Spoke with [facility] and wound care [phone] regarding patients treatments for radiation cystitis. Jerry Pena states it was extremely difficulty to reach patient. She was able to finally get him scheduled. Patient showed up early and the clinic was not open yet. Patient then called back and canceled all treatments.

## 2021-11-24 ENCOUNTER — Encounter: Payer: Medicare HMO | Admitting: Physician Assistant

## 2021-11-25 ENCOUNTER — Encounter: Payer: Medicare HMO | Admitting: Physician Assistant

## 2021-11-28 ENCOUNTER — Encounter: Payer: Medicare HMO | Admitting: Physician Assistant

## 2021-11-29 ENCOUNTER — Encounter: Payer: Medicare HMO | Admitting: Physician Assistant

## 2021-11-30 ENCOUNTER — Encounter: Payer: Medicare HMO | Admitting: Internal Medicine

## 2021-12-01 ENCOUNTER — Encounter: Payer: Medicare HMO | Admitting: Physician Assistant

## 2021-12-02 ENCOUNTER — Encounter: Payer: Medicare HMO | Admitting: Physician Assistant

## 2022-01-26 ENCOUNTER — Inpatient Hospital Stay: Payer: Medicare HMO | Attending: Radiation Oncology

## 2022-01-26 ENCOUNTER — Other Ambulatory Visit: Payer: Self-pay

## 2022-01-26 DIAGNOSIS — C61 Malignant neoplasm of prostate: Secondary | ICD-10-CM | POA: Diagnosis not present

## 2022-01-26 LAB — PSA: Prostatic Specific Antigen: <0.01 ng/mL (ref 0.00–4.00)

## 2022-02-02 ENCOUNTER — Encounter: Payer: Self-pay | Admitting: Radiation Oncology

## 2022-02-02 ENCOUNTER — Ambulatory Visit
Admission: RE | Admit: 2022-02-02 | Discharge: 2022-02-02 | Disposition: A | Discharge status: Home / Self Care | Payer: Medicare HMO | Source: Ambulatory Visit | Attending: Radiation Oncology | Admitting: Radiation Oncology

## 2022-02-02 ENCOUNTER — Other Ambulatory Visit: Payer: Self-pay

## 2022-02-02 VITALS — BP 126/79 | HR 71 | Temp 96.0°F | Resp 16 | Wt 131.6 lb

## 2022-02-02 DIAGNOSIS — K625 Hemorrhage of anus and rectum: Secondary | ICD-10-CM | POA: Diagnosis not present

## 2022-02-02 DIAGNOSIS — Z923 Personal history of irradiation: Secondary | ICD-10-CM | POA: Insufficient documentation

## 2022-02-02 DIAGNOSIS — Z08 Encounter for follow-up examination after completed treatment for malignant neoplasm: Secondary | ICD-10-CM | POA: Diagnosis not present

## 2022-02-02 DIAGNOSIS — C61 Malignant neoplasm of prostate: Secondary | ICD-10-CM | POA: Diagnosis not present

## 2022-02-02 NOTE — Progress Notes (Signed)
Radiation Oncology Follow up Note  Name: Jerry Pena   Date:   02/02/2022 MRN:  [id number] DOB: Dec 05, 1951    This 71 y.o. male presents to the clinic today for 3-1/2-year follow-up status post salvage radiation therapy to his prostate status post robotic Prostatectomy for Gleason 7 (4+3) adenocarcinoma the prostate a T3N1 lesion.Marland Kitchen  REFERRING PROVIDER: [facility]*  HPI: Patient is a 71 year old male now out over 3 and half years having completed salvage radiation therapy to his prostatic fossa and pelvic nodes in a patient status post robotic prostatectomy for Gleason 7 adenocarcinoma initial stage T3 aN1 M0.  Seen today in routine follow-up he is doing well he was having some hematuria although that has cleared.  He declined hyperbaric treatment.  He states occasionally does have some blood per rectum although it is quite infrequent.  He specifically denies any other increased lower urinary tract symptoms diarrhea or fatigue.  His most recent PSA continues to be undetectable at less than 0.01.Marland Kitchen  COMPLICATIONS OF TREATMENT: none  FOLLOW UP COMPLIANCE: keeps appointments   PHYSICAL EXAM:  BP 126/79 (BP Location: Left Arm, Patient Position: Sitting)    Pulse 71    Temp (!) 96 F (35.6 C) (Tympanic)    Resp 16    Wt 131 lb 9.6 oz (59.7 kg)    BMI 20.01 kg/m  Well-developed well-nourished patient in NAD. HEENT reveals PERLA, EOMI, discs not visualized.  Oral cavity is clear. No oral mucosal lesions are identified. Neck is clear without evidence of cervical or supraclavicular adenopathy. Lungs are clear to A&P. Cardiac examination is essentially unremarkable with regular rate and rhythm without murmur rub or thrill. Abdomen is benign with no organomegaly or masses noted. Motor sensory and DTR levels are equal and symmetric in the upper and lower extremities. Cranial nerves II through XII are grossly intact. Proprioception is intact. No peripheral adenopathy or edema is identified. No  motor or sensory levels are noted. Crude visual fields are within normal range.  RADIOLOGY RESULTS: No current films for review  PLAN: Present time patient is now close to 4 years of salvage radiation therapy.  I am going to turn follow-up care over to urology.  I be happy to reevaluate the patient in time the future should that be indicated.  Patient is to call with any concerns.  I would like to take this opportunity to thank you for allowing me to participate in the care of your patient.Noreene Filbert, MD

## 2022-04-21 ENCOUNTER — Other Ambulatory Visit: Payer: Medicare HMO

## 2022-04-24 NOTE — Progress Notes (Signed)
? ?04/25/2022 ?12:28 PM  ? ?Jerry Pena ?May 11, 1951 ?[id number] ? ?Referring provider:  ?No referring provider defined for this encounter. ?Chief Complaint  ?Patient presents with  ? Prostate Cancer  ? ? ?HPI: ?Jerry Pena is a 71 y.o.male with a personal history of prostate cancer, gross hematuria, radiation cystitis, and stress incontinence of urine, who presents today for a 6 month follow-up with PSA prior.  ? ?He is s/p prostatectomy/salvage XRT. Surgical pathology consistent with Gleason 4+3 prostate cancer since of extraprostatic extension but negative margins.  In addition to this, 1 of 4 nodes from the right pelvic lymph nodes including external iliac and obturator positive, 0 of 2 on the left. pT3a N1 Mx. ?  ?Ultimately, his PSA began to rise up to 0.2 and he underwent whole pelvic radiation in 2019.  He also received one 82-monthDepo of Lupron at that time refused any further secondary to side effects. ?   ?He underwent a biopsy on 2/282022 that revealed pathology consistent with radiation cystitis.  ?  ?His PSA remains undetectable as of 01/26/2022.  ? ?He denies in gross hematuria. He is having urinary leakage which is mostly at nighttime, minimal in the daytime.  Incontinence items are becoming expensive.  He reports that he did not undergo hyperbaric oxygen treatment.  ? ?PMH: ?Past Medical History:  ?Diagnosis Date  ? Beta thalassemia trait 09/27/2018  ? [place]  ? Prostate  ? DDD (degenerative disc disease), lumbar 06/13/2017  ? Lumbar imaging June 2018  ? Elevated PSA 09/12/2017  ? Refer to urologist, Sept 2018  ? Facet hypertrophy of lumbar region 06/13/2017  ? Lumbar imaging June 2018  ? GERD (gastroesophageal reflux disease)   ? occ no meds  ? Hyperlipidemia   ? ? ?Surgical History: ?Past Surgical History:  ?Procedure Laterality Date  ? CATARACT EXTRACTION W/ INTRAOCULAR LENS IMPLANT Left 3/ 28/2014  ? eyes  ? CATARACT EXTRACTION W/ INTRAOCULAR LENS IMPLANT Right 04/23/2013  ?  COLONOSCOPY WITH PROPOFOL N/A 07/29/2019  ? Procedure: COLONOSCOPY WITH PROPOFOL;  Surgeon: VLin Landsman MD;  [facility];  Service: Gastroenterology;  Laterality: N/A;  ? CYSTOSCOPY W/ RETROGRADES Bilateral 02/21/2021  ? Procedure: CYSTOSCOPY WITH RETROGRADE PYELOGRAM;  Surgeon: BHollice Espy MD;  Location: [facility];  Service: Urology;  Laterality: Bilateral;  ? CYSTOSCOPY WITH BIOPSY N/A 02/21/2021  ? Procedure: CYSTOSCOPY WITH BIOPSY and fulgeration;  Surgeon: BHollice Espy MD;  Location: [facility];  Service: Urology;  Laterality: N/A;  ? FLEXIBLE SIGMOIDOSCOPY N/A 11/14/2019  ? Procedure: FLEXIBLE SIGMOIDOSCOPY;  Surgeon: VLin Landsman MD;  [facility];  Service: Gastroenterology;  Laterality: N/A;  ? PELVIC LYMPH NODE DISSECTION N/A 12/10/2017  ? Procedure: PELVIC LYMPH NODE DISSECTION;  Surgeon: BHollice Espy MD;  Location: [facility];  Service: Urology;  Laterality: N/A;  ? PROSTATE SURGERY    ? ROBOT ASSISTED LAPAROSCOPIC RADICAL PROSTATECTOMY N/A 12/10/2017  ? Procedure: ROBOTIC ASSISTED LAPAROSCOPIC RADICAL PROSTATECTOMY;  Surgeon: BHollice Espy MD;  Location: [facility];  Service: Urology;  Laterality: N/A;  ? ? ?Home Medications:  ?Allergies as of 04/25/2022   ? ?   Reactions  ? Aspirin Other (See Comments)  ? Upset stomach, indigestion and heart burn  ? Penicillins Hives, Other (See Comments)  ? Has patient had a PCN reaction causing immediate rash, facial/tongue/throat swelling, SOB or lightheadedness with hypotension: Yes ?Has patient had a PCN reaction causing severe rash involving mucus membranes or skin necrosis: No ?Has patient had a  PCN reaction that required hospitalization: Yes ?Has patient had a PCN reaction occurring within the last 10 years: No ?If all of the above answers are "NO", then may proceed with Cephalosporin use.  ? ?  ? ?  ?Medication List  ?  ? ?  ? Accurate as of Apr 25, 2022 12:28 PM. If you have any questions, ask your nurse or doctor.   ?  ?  ? ?  ? ?acetaminophen 500 MG tablet ?Commonly known as: TYLENOL ?Take 1,000 mg by mouth every 6 (six) hours as needed for moderate pain or headache. ?  ?calcium-vitamin D 500-200 MG-UNIT tablet ?Commonly known as: OSCAL WITH D ?Take 1 tablet by mouth daily. ?  ?docusate sodium 100 MG capsule ?Commonly known as: COLACE ?Take 100 mg by mouth daily. ?  ?folic acid 625 MCG tablet ?Commonly known as: FOLVITE ?Take 1 tablet (400 mcg total) by mouth daily. ?  ?ibuprofen 200 MG tablet ?Commonly known as: ADVIL ?Take 400 mg by mouth every 6 (six) hours as needed for headache or moderate pain. ?  ?melatonin 5 MG Tabs ?Take 5 mg by mouth daily as needed. ?  ?multivitamin tablet ?Take 1 tablet by mouth daily. ?  ?NONFORMULARY OR COMPOUNDED ITEM ?Super Trimix (30/2/20)-(Pap/Phent/PGE) ? ?Dosage: Inject 0.5 cc per injection Titrate up to 1cc as needed ? ? ?Vial 31m ? ?Qty #10 Refills 6 ? ?[facility] ?[phone]?Fax [phone]?  ?polyethylene glycol 17 g packet ?Commonly known as: MIRALAX / GLYCOLAX ?Take 17 g by mouth daily. ?  ?vitamin B-12 100 MCG tablet ?Commonly known as: CYANOCOBALAMIN ?Take 100 mcg by mouth daily. ?  ?vitamin C 500 MG tablet ?Commonly known as: ASCORBIC ACID ?Take 500 mg by mouth daily. ?  ? ?  ? ? ?Allergies:  ?Allergies  ?Allergen Reactions  ? Aspirin Other (See Comments)  ?  Upset stomach, indigestion and heart burn  ? Penicillins Hives and Other (See Comments)  ?  Has patient had a PCN reaction causing immediate rash, facial/tongue/throat swelling, SOB or lightheadedness with hypotension: Yes ?Has patient had a PCN reaction causing severe rash involving mucus membranes or skin necrosis: No ?Has patient had a PCN reaction that required hospitalization: Yes ?Has patient had a PCN reaction occurring within the last 10 years: No ?If all of the above answers are "NO", then may proceed with Cephalosporin use. ?  ? ? ?Family History: ?Family History  ?Problem Relation Age of Onset  ?  Stroke Mother   ? Stroke Father   ? Cancer Sister   ?     breast, lung  ? Pulmonary Hypertension Sister   ? Cancer Son   ?     prostate  ? Prostate cancer Son   ? Hypertension Sister   ? Bladder Cancer Neg Hx   ? Kidney cancer Neg Hx   ? ? ?Social History:  reports that he has been smoking cigars. He has never used smokeless tobacco. He reports current alcohol use. He reports that he does not use drugs. ? ? ?Physical Exam: ?BP 131/77   Pulse 85   Ht '5\' 8"'$  (1.727 m)   Wt 140 lb (63.5 kg)   BMI 21.29 kg/m?   ?Constitutional:  Alert and oriented, No acute distress. ?HEENT:  AT, moist mucus membranes.  Trachea midline, no masses. ?Cardiovascular: No clubbing, cyanosis, or edema. ?Respiratory: Normal respiratory effort, no increased work of breathing. ?Skin: No rashes, bruises or suspicious lesions. ?Neurologic: Grossly intact, no focal deficits, moving  all 4 extremities. ?Psychiatric: Normal mood and affect. ? ?Laboratory Data: ? ?Lab Results  ?Component Value Date  ? CREATININE 1.02 02/17/2021  ? ?No results found for: HGBA1C ? ? ?Assessment & Plan:   ?Prostate cancer ([place]) ?- Remains NED  ?- S/p prostatectomy with salvage radiation and ADT  ?-Continue to monitor PSA on a every 6 month basis ?  ?2. Stress incontinence  ?- Ongoing and stable  ?- We reviewed the option of a Wiesner clamp for his incontinence.  ?-His issues with radiation cystitis as well as pelvic radiation, continue to be concerned about the risk of complications if he elects to pursue sphincter.  He is not interested at this time. ? ?3.  Radiation cystitis ?-No further issues since last visit ?-If he has recurrence, consider hyperbaric oxygen ? ?Follow-up PSA only in 6 months, PSA with MD in a year or sooner as needed ? ?Conley Rolls as a scribe for [name], MD.,have documented all relevant documentation on the behalf of [name], MD,as directed by  [name], MD while in the presence of [name],  MD. ? ? ?[place] ?[address], [address] ?[place], [place] [address] ?[phone] ?

## 2022-04-25 ENCOUNTER — Encounter: Payer: Self-pay | Admitting: Urology

## 2022-04-25 ENCOUNTER — Ambulatory Visit (INDEPENDENT_AMBULATORY_CARE_PROVIDER_SITE_OTHER): Payer: Medicare Other | Admitting: Urology

## 2022-04-25 ENCOUNTER — Other Ambulatory Visit: Payer: Medicare HMO

## 2022-04-25 VITALS — BP 131/77 | HR 85 | Ht 68.0 in | Wt 140.0 lb

## 2022-04-25 DIAGNOSIS — C61 Malignant neoplasm of prostate: Secondary | ICD-10-CM

## 2022-04-25 DIAGNOSIS — Z8546 Personal history of malignant neoplasm of prostate: Secondary | ICD-10-CM

## 2022-04-25 DIAGNOSIS — N3040 Irradiation cystitis without hematuria: Secondary | ICD-10-CM

## 2022-04-25 DIAGNOSIS — N393 Stress incontinence (female) (male): Secondary | ICD-10-CM

## 2022-04-25 DIAGNOSIS — R310 Gross hematuria: Secondary | ICD-10-CM

## 2022-04-25 NOTE — Patient Instructions (Signed)
    [email or web address]  The Wiesner Incontinence Clamp is an external medical device used to control urine leakage by compressing the urethra and preventing the flow of urine   Lets you maintain your active lifestyle Cost effective, saving you lots of money on adult incontinence briefs Can be worn during any activity Ergonomic design promotes confidence and provides all-day comfort    Step 3 Latch the incontinence clamp to compress the  urethra at the level that's comfortable to you         Step 2 Place your penis between the silicone rubber pads with the  incontinence clamp about halfway down the shaft of your penis Step 1 Open the incontinence clamp by  releasing the catch and lifting up the top part  

## 2022-07-18 ENCOUNTER — Ambulatory Visit: Payer: Medicare HMO

## 2022-09-26 ENCOUNTER — Telehealth: Payer: Self-pay | Admitting: Nurse Practitioner

## 2022-09-26 NOTE — Telephone Encounter (Signed)
Patient notified of upcoming appointment and was informed we will catch him up on all health maintenance procedures at that time

## 2022-09-26 NOTE — Telephone Encounter (Signed)
Patient has a cpe appointment on 10/11 will schedule all referrals at that time. Patient has not been seen in office in 1 year

## 2022-09-26 NOTE — Telephone Encounter (Signed)
Referral Request - Has patient seen PCP for this complaint? no *If NO, is insurance requiring patient see PCP for this issue before PCP can refer them? Referral for which specialty: colonsoscopy Preferred provider/office: ? Reason for referral: routine

## 2022-10-04 ENCOUNTER — Encounter: Payer: Self-pay | Admitting: Nurse Practitioner

## 2022-10-04 ENCOUNTER — Ambulatory Visit (INDEPENDENT_AMBULATORY_CARE_PROVIDER_SITE_OTHER): Payer: Medicare Other | Admitting: Nurse Practitioner

## 2022-10-04 VITALS — BP 100/62 | HR 90 | Temp 97.5°F | Resp 18 | Ht 68.0 in | Wt 128.9 lb

## 2022-10-04 DIAGNOSIS — Z0000 Encounter for general adult medical examination without abnormal findings: Secondary | ICD-10-CM

## 2022-10-04 DIAGNOSIS — Z23 Encounter for immunization: Secondary | ICD-10-CM | POA: Diagnosis not present

## 2022-10-04 DIAGNOSIS — Z720 Tobacco use: Secondary | ICD-10-CM | POA: Diagnosis not present

## 2022-10-04 NOTE — Progress Notes (Addendum)
Name: Jerry Pena   MRN: [id number]    DOB: 1951/10/14   Date:10/04/2022       Progress Note  Subjective  Chief Complaint  Chief Complaint  Patient presents with   Annual Exam    HPI  Patient presents for annual CPE .  IPSS Questionnaire (AUA-7): Over the past month.   1)  How often have you had a sensation of not emptying your bladder completely after you finish urinating?  0 - Not at all  2)  How often have you had to urinate again less than two hours after you finished urinating? 0 - Not at all  3)  How often have you found you stopped and started again several times when you urinated?  0 - Not at all  4) How difficult have you found it to postpone urination?  0 - Not at all  5) How often have you had a weak urinary stream?  0 - Not at all  6) How often have you had to push or strain to begin urination?  0 - Not at all  7) How many times did you most typically get up to urinate from the time you went to bed until the time you got up in the morning?  0 - None  Total score:  0-7 mildly symptomatic   8-19 moderately symptomatic   20-35 severely symptomatic     Diet: well balanced diet Exercise: he says he lifts weights, rides his bike Sleep: 8 hours Last dental exam:been a while Last eye exam: been a while  Depression: phq 9 is negative    10/04/2022    3:37 PM 07/07/2021    8:37 AM 12/14/2020    7:59 AM 10/21/2020    8:26 AM 08/09/2020    7:35 AM  Depression screen PHQ 2/9  Decreased Interest 0 0 0 0 0  Down, Depressed, Hopeless 0 0 0 0 0  PHQ - 2 Score 0 0 0 0 0  Altered sleeping 0    1  Tired, decreased energy 0    1  Change in appetite 0    0  Feeling bad or failure about yourself  0    0  Trouble concentrating 0    0  Moving slowly or fidgety/restless 0    0  Suicidal thoughts 0    0  PHQ-9 Score 0    2  Difficult doing work/chores Not difficult at all    Not difficult at all    Hypertension:  BP Readings from Last 3 Encounters:  10/04/22 100/62   04/25/22 131/77  02/02/22 126/79    Obesity: Wt Readings from Last 3 Encounters:  10/04/22 128 lb 14.4 oz (58.5 kg)  04/25/22 140 lb (63.5 kg)  02/02/22 131 lb 9.6 oz (59.7 kg)   BMI Readings from Last 3 Encounters:  10/04/22 19.60 kg/m  04/25/22 21.29 kg/m  02/02/22 20.01 kg/m     Lipids:  Lab Results  Component Value Date   CHOL 165 06/22/2020   CHOL 157 06/13/2019   CHOL 168 06/14/2018   Lab Results  Component Value Date   HDL 71 06/22/2020   HDL 61 06/13/2019   HDL 54 06/14/2018   Lab Results  Component Value Date   LDLCALC 78 06/22/2020   LDLCALC 77 06/13/2019   LDLCALC 86 06/14/2018   Lab Results  Component Value Date   TRIG 77 06/22/2020   TRIG 104 06/13/2019   TRIG 184 (H) 06/14/2018  Lab Results  Component Value Date   CHOLHDL 2.3 06/22/2020   CHOLHDL 2.6 06/13/2019   CHOLHDL 3.1 06/14/2018   No results found for: "LDLDIRECT" Glucose:  Glucose, Bld  Date Value Ref Range Status  02/17/2021 96 70 - 99 mg/dL Final    Comment:    Glucose reference range applies only to samples taken after fasting for at least 8 hours.  10/04/2020 97 70 - 99 mg/dL Final    Comment:    Glucose reference range applies only to samples taken after fasting for at least 8 hours.  06/22/2020 86 65 - 99 mg/dL Final    Comment:    .            Fasting reference interval .     Flowsheet Row Clinical Support from 07/07/2021 in [facility]  AUDIT-C Score 1        Divorced STD testing and prevention (HIV/chl/gon/syphilis): none found Hep C: 09/03/2017  Skin cancer: Discussed monitoring for atypical lesions Colorectal cancer: 07/29/2019 Prostate cancer: patient currently has prostate cancer and is being cared for by Dr. Erlene Quan urology Lab Results  Component Value Date   PSA 4.4 (H) 09/03/2017     Lung cancer:   Low Dose CT Chest recommended if Age 54-80 years, 72 pack-year currently smoking OR have quit w/in 15years. Patient does not  qualify.  Smokes two cigars a week AAA:  The USPSTF recommends one-time screening with ultrasonography in men ages 54 to 75 years who have ever smoked ECG:  02/22/2021  Vaccines:  HPV: up to at age 72 , ask insurance if age between 26-45  Shingrix: 59-64 yo and ask insurance if covered when patient above 44 yo Pneumonia:  educated and discussed with patient. Flu:  educated and discussed with patient.  Advanced Care Planning: A voluntary discussion about advance care planning including the explanation and discussion of advance directives.  Discussed health care proxy and Living will, and the patient was able to identify a health care proxy as son.  Patient does have a living will at present time. If patient does have living will, I have requested they bring this to the clinic to be scanned in to their chart.  Patient Active Problem List   Diagnosis Date Noted   Impingement syndrome of shoulder region 07/07/2021   Osteoarthritis of right shoulder 07/07/2021   Pain in joint, hand 07/07/2021   Lipoma of shoulder 07/07/2021   Non-recurrent unilateral inguinal hernia without obstruction or gangrene 10/21/2020   Chronic constipation 10/14/2019   Rectal bleeding 10/14/2019   Radiation proctitis    Migraine 01/23/2019   Cataract 01/23/2019   Beta thalassemia trait 09/27/2018   Neutropenia ([place]) 09/23/2018   History of hormone therapy 06/07/2018   Prostate cancer ([place]) 12/10/2017   Facet hypertrophy of lumbar region 06/13/2017   DDD (degenerative disc disease), lumbar 06/13/2017   Hyperlipidemia 09/09/2015   Sciatica 09/09/2015   Arthritis, lumbar spine 09/09/2015   Tobacco abuse 09/09/2015   Personal history of colonic polyps 09/09/2015    Past Surgical History:  Procedure Laterality Date   CATARACT EXTRACTION W/ INTRAOCULAR LENS IMPLANT Left 3/ 28/2014   eyes   CATARACT EXTRACTION W/ INTRAOCULAR LENS IMPLANT Right 04/23/2013   COLONOSCOPY WITH PROPOFOL N/A 07/29/2019   Procedure:  COLONOSCOPY WITH PROPOFOL;  Surgeon: Lin Landsman, MD;  Location: [facility] ENDOSCOPY;  Service: Gastroenterology;  Laterality: N/A;   CYSTOSCOPY W/ RETROGRADES Bilateral 02/21/2021   Procedure: CYSTOSCOPY WITH RETROGRADE PYELOGRAM;  Surgeon:  [name], MD;  Location: [facility] ORS;  Service: Urology;  Laterality: Bilateral;   CYSTOSCOPY WITH BIOPSY N/A 02/21/2021   Procedure: CYSTOSCOPY WITH BIOPSY and fulgeration;  Surgeon: [name], MD;  Location: [facility] ORS;  Service: Urology;  Laterality: N/A;   FLEXIBLE SIGMOIDOSCOPY N/A 11/14/2019   Procedure: FLEXIBLE SIGMOIDOSCOPY;  Surgeon: Lin Landsman, MD;  Location: [facility] ENDOSCOPY;  Service: Gastroenterology;  Laterality: N/A;   PELVIC LYMPH NODE DISSECTION N/A 12/10/2017   Procedure: PELVIC LYMPH NODE DISSECTION;  Surgeon: [name], MD;  Location: [facility] ORS;  Service: Urology;  Laterality: N/A;   PROSTATE SURGERY     ROBOT ASSISTED LAPAROSCOPIC RADICAL PROSTATECTOMY N/A 12/10/2017   Procedure: ROBOTIC ASSISTED LAPAROSCOPIC RADICAL PROSTATECTOMY;  Surgeon: [name], MD;  Location: [facility] ORS;  Service: Urology;  Laterality: N/A;    Family History  Problem Relation Age of Onset   Stroke Mother    Stroke Father    Cancer Sister        breast, lung   Pulmonary Hypertension Sister    Cancer Son        prostate   Prostate cancer Son    Hypertension Sister    Bladder Cancer Neg Hx    Kidney cancer Neg Hx     Social History   Socioeconomic History   Marital status: Divorced    Spouse name: Not on file   Number of children: 4   Years of education: Not on file   Highest education level: Associate degree: academic program  Occupational History   Occupation: Retired  Tobacco Use   Smoking status: Some Days    Types: Cigars   Smokeless tobacco: Never   Tobacco comments:    occasional cigar  Vaping Use   Vaping Use: Never used  Substance and Sexual Activity   Alcohol use: Yes    Alcohol/week: 0.0 standard  drinks of alcohol    Comment: beer rarely   Drug use: No    Comment: used marijuana more than 10 years ago   Sexual activity: Not Currently  Other Topics Concern   Not on file  Social History Narrative   Lives alone. Patient just buried his baby sister, Diane,10/2018   Social Determinants of Health   Financial Resource Strain: Low Risk  (07/07/2021)   Overall Financial Resource Strain (CARDIA)    Difficulty of Paying Living Expenses: Not hard at all  Food Insecurity: No Food Insecurity (07/07/2021)   Hunger Vital Sign    Worried About Running Out of Food in the Last Year: Never true    Ran Out of Food in the Last Year: Never true  Transportation Needs: No Transportation Needs (07/07/2021)   PRAPARE - Hydrologist (Medical): No    Lack of Transportation (Non-Medical): No  Physical Activity: Sufficiently Active (07/07/2021)   Exercise Vital Sign    Days of Exercise per Week: 7 days    Minutes of Exercise per Session: 60 min  Stress: No Stress Concern Present (07/07/2021)   [place]    Feeling of Stress : Only a little  Social Connections: Moderately Isolated (07/07/2021)   Social Connection and Isolation Panel [NHANES]    Frequency of Communication with Friends and Family: More than three times a week    Frequency of Social Gatherings with Friends and Family: More than three times a week    Attends Religious Services: More than 4 times per year    Active  Member of Clubs or Organizations: No    Attends Archivist Meetings: Never    Marital Status: Divorced  Human resources officer Violence: Not At Risk (07/07/2021)   Humiliation, Afraid, Rape, and Kick questionnaire    Fear of Current or Ex-Partner: No    Emotionally Abused: No    Physically Abused: No    Sexually Abused: No     Current Outpatient Medications:    acetaminophen (TYLENOL) 500 MG tablet, Take 1,000 mg by mouth every 6  (six) hours as needed for moderate pain or headache. , Disp: , Rfl:    calcium-vitamin D (OSCAL WITH D) 500-200 MG-UNIT tablet, Take 1 tablet by mouth daily., Disp: , Rfl:    docusate sodium (COLACE) 100 MG capsule, Take 100 mg by mouth daily., Disp: , Rfl:    folic acid (FOLVITE) 017 MCG tablet, Take 1 tablet (400 mcg total) by mouth daily., Disp: 90 tablet, Rfl: 4   ibuprofen (ADVIL,MOTRIN) 200 MG tablet, Take 400 mg by mouth every 6 (six) hours as needed for headache or moderate pain., Disp: , Rfl:    Melatonin 5 MG TABS, Take 5 mg by mouth daily as needed., Disp: , Rfl:    Multiple Vitamin (MULTIVITAMIN) tablet, Take 1 tablet by mouth daily., Disp: , Rfl:    NONFORMULARY OR COMPOUNDED ITEM, Super Trimix (30/2/20)-(Pap/Phent/PGE)  Dosage: Inject 0.5 cc per injection Titrate up to 1cc as needed   Vial 9m  Qty #10 Refills 6  [phone] [phone] Disp: 10 each, Rfl: 6   polyethylene glycol (MIRALAX / GLYCOLAX) 17 g packet, Take 17 g by mouth daily., Disp: , Rfl:    vitamin B-12 (CYANOCOBALAMIN) 100 MCG tablet, Take 100 mcg by mouth daily., Disp: , Rfl:    vitamin C (ASCORBIC ACID) 500 MG tablet, Take 500 mg by mouth daily., Disp: , Rfl:   Allergies  Allergen Reactions   Aspirin Other (See Comments)    Upset stomach, indigestion and heart burn   Penicillins Hives and Other (See Comments)    Has patient had a PCN reaction causing immediate rash, facial/tongue/throat swelling, SOB or lightheadedness with hypotension: Yes Has patient had a PCN reaction causing severe rash involving mucus membranes or skin necrosis: No Has patient had a PCN reaction that required hospitalization: Yes Has patient had a PCN reaction occurring within the last 10 years: No If all of the above answers are "NO", then may proceed with Cephalosporin use.      ROS  Constitutional: Negative for fever or weight change.  Respiratory: Negative for cough and shortness of breath.    Cardiovascular: Negative for chest pain or palpitations.  Gastrointestinal: Negative for abdominal pain, no bowel changes.  Musculoskeletal: Negative for gait problem or joint swelling.  Skin: Negative for rash.  Neurological: Negative for dizziness or headache.  No other specific complaints in a complete review of systems (except as listed in HPI above).    Objective  Vitals:   10/04/22 1514 10/04/22 1548  BP: 100/62   Pulse: (!) 107 90  Resp: 18   Temp: (!) 97.5 F (36.4 C)   TempSrc: Oral   SpO2: 96%   Weight: 128 lb 14.4 oz (58.5 kg)   Height: '5\' 8"'$  (1.727 m)     Body mass index is 19.6 kg/m.  Physical Exam  Constitutional: Patient appears well-developed and well-nourished. No distress.  HENT: Head: Normocephalic and atraumatic. Ears: B TMs ok, no erythema or effusion; Nose: Nose normal. Mouth/Throat: Oropharynx  is clear and moist. No oropharyngeal exudate.  Eyes: Conjunctivae and EOM are normal. Pupils are equal, round, and reactive to light. No scleral icterus.  Neck: Normal range of motion. Neck supple. No JVD present. No thyromegaly present.  Cardiovascular: Normal rate, regular rhythm and normal heart sounds.  No murmur heard. No BLE edema. Pulmonary/Chest: Effort normal and breath sounds normal. No respiratory distress. Abdominal: Soft. Bowel sounds are normal, no distension. There is no tenderness. Right inguinal hernia noted Musculoskeletal: Normal range of motion, no joint effusions. No gross deformities Neurological: he is alert and oriented to person, place, and time. No cranial nerve deficit. Coordination, balance, strength, speech and gait are normal.  Skin: Skin is warm and dry. No rash noted. No erythema.  Psychiatric: Patient has a normal mood and affect. behavior is normal. Judgment and thought content normal.   No results found for this or any previous visit (from the past 2160 hour(s)).   Fall Risk:    07/07/2021    8:39 AM 12/14/2020    7:59 AM  10/28/2020   10:51 AM 10/21/2020    8:26 AM 08/09/2020    7:35 AM  Fall Risk   Falls in the past year? 0 0 0 0 0  Number falls in past yr: 0 0 0 0 0  Injury with Fall? 0 0 0 0 0  Risk for fall due to : No Fall Risks      Follow up Falls prevention discussed          Functional Status Survey: Is the patient deaf or have difficulty hearing?: No Does the patient have difficulty seeing, even when wearing glasses/contacts?: No Does the patient have difficulty concentrating, remembering, or making decisions?: No Does the patient have difficulty walking or climbing stairs?: No Does the patient have difficulty dressing or bathing?: No Does the patient have difficulty doing errands alone such as visiting a doctor's office or shopping?: No    Assessment & Plan  1. Encounter for annual physical exam  - US AORTA DUPLEX LIMITED; Future - CBC with Differential/Platelet - COMPLETE METABOLIC PANEL WITH GFR - Lipid panel  2. Tobacco abuse  - US AORTA DUPLEX LIMITED; Future  3. Need for influenza vaccination  - Flu Vaccine QUAD High Dose(Fluad)  4. Need for pneumococcal vaccine  - Pneumococcal conjugate vaccine 20-valent (Prevnar 20)    -Prostate cancer screening and PSA options (with potential risks and benefits of testing vs not testing) were discussed along with recent recs/guidelines. -USPSTF grade A and B recommendations reviewed with patient; age-appropriate recommendations, preventive care, screening tests, etc discussed and encouraged; healthy living encouraged; see AVS for patient education given to patient -Discussed importance of 150 minutes of physical activity weekly, eat two servings of fish weekly, eat one serving of tree nuts ( cashews, pistachios, pecans, almonds.Marland Kitchen) every other day, eat 6 servings of fruit/vegetables daily and drink plenty of water and avoid sweet beverages.

## 2022-10-05 LAB — COMPLETE METABOLIC PANEL WITH GFR
AG Ratio: 1.6 (calc) (ref 1.0–2.5)
ALT: 9 U/L (ref 9–46)
AST: 19 U/L (ref 10–35)
Albumin: 4.4 g/dL (ref 3.6–5.1)
Alkaline phosphatase (APISO): 71 U/L (ref 35–144)
BUN: 19 mg/dL (ref 7–25)
CO2: 29 mmol/L (ref 20–32)
Calcium: 9.8 mg/dL (ref 8.6–10.3)
Chloride: 105 mmol/L (ref 98–110)
Creat: 1.01 mg/dL (ref 0.70–1.28)
Globulin: 2.7 g/dL (calc) (ref 1.9–3.7)
Glucose, Bld: 93 mg/dL (ref 65–99)
Potassium: 4.3 mmol/L (ref 3.5–5.3)
Sodium: 141 mmol/L (ref 135–146)
Total Bilirubin: 0.8 mg/dL (ref 0.2–1.2)
Total Protein: 7.1 g/dL (ref 6.1–8.1)
eGFR: 79 mL/min/{1.73_m2} (ref >=60)

## 2022-10-05 LAB — CBC WITH DIFFERENTIAL/PLATELET
Absolute Monocytes: 483 cells/uL (ref 200–950)
Basophils Absolute: 41 cells/uL (ref 0–200)
Basophils Relative: 0.9 %
Eosinophils Absolute: 69 cells/uL (ref 15–500)
Eosinophils Relative: 1.5 %
HCT: 40.4 % (ref 38.5–50.0)
Hemoglobin: 12.8 g/dL — ABNORMAL LOW (ref 13.2–17.1)
Lymphs Abs: 1463 cells/uL (ref 850–3900)
MCH: 22.9 pg — ABNORMAL LOW (ref 27.0–33.0)
MCHC: 31.7 g/dL — ABNORMAL LOW (ref 32.0–36.0)
MCV: 72.4 fL — ABNORMAL LOW (ref 80.0–100.0)
MPV: 10.8 fL (ref 7.5–12.5)
Monocytes Relative: 10.5 %
Neutro Abs: 2544 cells/uL (ref 1500–7800)
Neutrophils Relative %: 55.3 %
Platelets: 332 10*3/uL (ref 140–400)
RBC: 5.58 10*6/uL (ref 4.20–5.80)
RDW: 17.1 % — ABNORMAL HIGH (ref 11.0–15.0)
Total Lymphocyte: 31.8 %
WBC: 4.6 10*3/uL (ref 3.8–10.8)

## 2022-10-05 LAB — LIPID PANEL
Cholesterol: 157 mg/dL (ref <200)
HDL: 67 mg/dL (ref >=40)
LDL Cholesterol (Calc): 76 mg/dL (calc)
Non-HDL Cholesterol (Calc): 90 mg/dL (calc) (ref <130)
Total CHOL/HDL Ratio: 2.3 (calc) (ref <5.0)
Triglycerides: 60 mg/dL (ref <150)

## 2022-10-26 ENCOUNTER — Other Ambulatory Visit: Payer: Medicare Other

## 2022-11-01 ENCOUNTER — Other Ambulatory Visit: Payer: Medicare Other

## 2022-11-01 DIAGNOSIS — C61 Malignant neoplasm of prostate: Secondary | ICD-10-CM

## 2022-11-02 LAB — PSA: Prostate Specific Ag, Serum: <0.1 ng/mL (ref 0.0–4.0)

## 2022-11-07 ENCOUNTER — Telehealth: Payer: Self-pay

## 2022-11-07 NOTE — Telephone Encounter (Signed)
-----   Message from [name], MD sent at 11/06/2022  9:08 AM EST ----- PSA is undetectable, great news.  [name], MD

## 2022-11-07 NOTE — Telephone Encounter (Signed)
Informed patient of results

## 2023-01-26 ENCOUNTER — Ambulatory Visit (INDEPENDENT_AMBULATORY_CARE_PROVIDER_SITE_OTHER): Payer: 59

## 2023-01-26 VITALS — BP 116/66 | Ht 68.5 in | Wt 130.4 lb

## 2023-01-26 DIAGNOSIS — Z0000 Encounter for general adult medical examination without abnormal findings: Secondary | ICD-10-CM | POA: Diagnosis not present

## 2023-01-26 NOTE — Progress Notes (Signed)
Subjective:   Jerry Pena is a 72 y.o. male who presents for Medicare Annual/Subsequent preventive examination.  Review of Systems     Cardiac Risk Factors include: advanced age (>37mn, >>31women);dyslipidemia;male gender;smoking/ tobacco exposure     Objective:    Today's Vitals   01/26/23 0807  BP: 116/66  Weight: 130 lb 6.4 oz (59.1 kg)  Height: 5' 8.5" (1.74 m)   Body mass index is 19.54 kg/m.     01/26/2023    8:19 AM 02/02/2022    8:54 AM 07/07/2021    8:38 AM 02/16/2021    1:58 PM 01/27/2021    9:02 AM 10/04/2020   10:26 AM 06/29/2020    9:43 AM  Advanced Directives  Does Patient Have a Medical Advance Directive? Yes Yes Yes Yes Yes Yes No  Type of AParamedicof [place]Living will [place] will [place] CenterLiving will  [place]Living will Living will;Healthcare Power of Attorney   Does patient want to make changes to medical advance directive?     Yes (MAU/Ambulatory/Procedural Areas - Information given) No - Patient declined   Copy of [place] Chart? No - copy requested No - copy requested No - copy requested      Would patient like information on creating a medical advance directive?       No - Patient declined    Current Medications (verified) Outpatient Encounter Medications as of 01/26/2023  Medication Sig   acetaminophen (TYLENOL) 500 MG tablet Take 1,000 mg by mouth every 6 (six) hours as needed for moderate pain or headache.    calcium-vitamin D (OSCAL WITH D) 500-200 MG-UNIT tablet Take 1 tablet by mouth daily.   docusate sodium (COLACE) 100 MG capsule Take 100 mg by mouth daily.   folic acid (FOLVITE) 4915MCG tablet Take 1 tablet (400 mcg total) by mouth daily.   ibuprofen (ADVIL,MOTRIN) 200 MG tablet Take 400 mg by mouth every 6 (six) hours as needed for headache or moderate pain.   Melatonin 5 MG TABS Take 5 mg by mouth daily as needed.    Multiple Vitamin (MULTIVITAMIN) tablet Take 1 tablet by mouth daily.   polyethylene glycol (MIRALAX / GLYCOLAX) 17 g packet Take 17 g by mouth daily.   vitamin B-12 (CYANOCOBALAMIN) 100 MCG tablet Take 100 mcg by mouth daily.   vitamin C (ASCORBIC ACID) 500 MG tablet Take 500 mg by mouth daily.   NONFORMULARY OR COMPOUNDED ITEM Super Trimix (30/2/20)-(Pap/Phent/PGE)  Dosage: Inject 0.5 cc per injection Titrate up to 1cc as needed   Vial 124m Qty #10 Refills 6 [phone] [phone] not taking: Reported on 01/26/2023)   No facility-administered encounter medications on file as of 01/26/2023.    Allergies (verified) Aspirin and Penicillins   History: Past Medical History:  Diagnosis Date   Beta thalassemia trait 09/27/2018   [place] [place]   Prostate   DDD (degenerative disc disease), lumbar 06/13/2017   Lumbar imaging June 2018   Elevated PSA 09/12/2017   Refer to urologist, Sept 2018   Facet hypertrophy of lumbar region 06/13/2017   Lumbar imaging June 2018   GERD (gastroesophageal reflux disease)    occ no meds   Hyperlipidemia    Past Surgical History:  Procedure Laterality Date   CATARACT EXTRACTION W/ INTRAOCULAR LENS IMPLANT Left 3/ 28/2014   eyes   CATARACT EXTRACTION W/ INTRAOCULAR LENS IMPLANT Right  04/23/2013   COLONOSCOPY WITH PROPOFOL N/A 07/29/2019   Procedure: COLONOSCOPY WITH PROPOFOL;  Surgeon: Lin Landsman, MD;  Location: [facility] ENDOSCOPY;  Service: Gastroenterology;  Laterality: N/A;   CYSTOSCOPY W/ RETROGRADES Bilateral 02/21/2021   Procedure: CYSTOSCOPY WITH RETROGRADE PYELOGRAM;  Surgeon: [name], MD;  Location: [facility] ORS;  Service: Urology;  Laterality: Bilateral;   CYSTOSCOPY WITH BIOPSY N/A 02/21/2021   Procedure: CYSTOSCOPY WITH BIOPSY and fulgeration;  Surgeon: [name], MD;  Location: [facility] ORS;  Service: Urology;  Laterality: N/A;   FLEXIBLE SIGMOIDOSCOPY N/A 11/14/2019   Procedure:  FLEXIBLE SIGMOIDOSCOPY;  Surgeon: Lin Landsman, MD;  Location: [facility] ENDOSCOPY;  Service: Gastroenterology;  Laterality: N/A;   PELVIC LYMPH NODE DISSECTION N/A 12/10/2017   Procedure: PELVIC LYMPH NODE DISSECTION;  Surgeon: [name], MD;  Location: [facility] ORS;  Service: Urology;  Laterality: N/A;   PROSTATE SURGERY     ROBOT ASSISTED LAPAROSCOPIC RADICAL PROSTATECTOMY N/A 12/10/2017   Procedure: ROBOTIC ASSISTED LAPAROSCOPIC RADICAL PROSTATECTOMY;  Surgeon: [name], MD;  Location: [facility] ORS;  Service: Urology;  Laterality: N/A;   Family History  Problem Relation Age of Onset   Stroke Mother    Stroke Father    Cancer Sister        breast, lung   Pulmonary Hypertension Sister    Cancer Son        prostate   Prostate cancer Son    Hypertension Sister    Bladder Cancer Neg Hx    Kidney cancer Neg Hx    Social History   Socioeconomic History   Marital status: Divorced    Spouse name: Not on file   Number of children: 4   Years of education: Not on file   Highest education level: Associate degree: academic program  Occupational History   Occupation: Retired  Tobacco Use   Smoking status: Some Days    Types: Cigars   Smokeless tobacco: Never   Tobacco comments:    occasional cigar  Vaping Use   Vaping Use: Never used  Substance and Sexual Activity   Alcohol use: Yes    Alcohol/week: 0.0 standard drinks of alcohol    Comment: beer rarely   Drug use: No    Comment: used marijuana more than 10 years ago   Sexual activity: Not Currently  Other Topics Concern   Not on file  Social History Narrative   Lives alone. Patient just buried his baby sister, Diane,10/2018   Social Determinants of Health   Financial Resource Strain: Low Risk  (01/26/2023)   Overall Financial Resource Strain (CARDIA)    Difficulty of Paying Living Expenses: Not hard at all  Food Insecurity: No Food Insecurity (01/26/2023)   Hunger Vital Sign    Worried About Running Out of Food in  the Last Year: Never true    Ran Out of Food in the Last Year: Never true  Transportation Needs: No Transportation Needs (01/26/2023)   PRAPARE - Hydrologist (Medical): No    Lack of Transportation (Non-Medical): No  Physical Activity: Sufficiently Active (07/07/2021)   Exercise Vital Sign    Days of Exercise per Week: 7 days    Minutes of Exercise per Session: 60 min  Stress: No Stress Concern Present (01/26/2023)   [place]    Feeling of Stress : Not at all  Social Connections: Moderately Isolated (01/26/2023)   Social Connection and Isolation Panel [NHANES]    Frequency  of Communication with Friends and Family: More than three times a week    Frequency of Social Gatherings with Friends and Family: More than three times a week    Attends Religious Services: More than 4 times per year    Active Member of [employer] or Organizations: No    Attends Music therapist: Never    Marital Status: Divorced    Tobacco Counseling Ready to quit: Not Answered Counseling given: Not Answered Tobacco comments: occasional cigar   Clinical Intake:  Pre-visit preparation completed: Yes  Pain : 0-10     BMI - recorded: 19.54 Nutritional Status: BMI of 19-24  Normal Nutritional Risks: None Diabetes: No  How often do you need to have someone help you when you read instructions, pamphlets, or other written materials from your doctor or pharmacy?: 1 - Never  Diabetic?NO  Interpreter Needed?: No  Information entered by :: B.Ezequiel Macauley,LPN   Activities of Daily Living    01/26/2023    8:20 AM 10/04/2022    3:42 PM  In your present state of health, do you have any difficulty performing the following activities:  Hearing? 0 0  Vision? 0 0  Difficulty concentrating or making decisions? 0 0  Walking or climbing stairs? 0 0  Dressing or bathing? 0 0  Doing errands, shopping? 0 0  Preparing  Food and eating ? N   Using the Toilet? N   In the past six months, have you accidently leaked urine? N   Do you have problems with loss of bowel control? N   Managing your Medications? N   Managing your Finances? N   Housekeeping or managing your Housekeeping? N     Patient Care Team: Bo Merino, FNP as PCP - General (Nurse Practitioner) [name], MD as Consulting Physician (Urology) Noreene Filbert, MD as Consulting Physician (Radiation Oncology)  Indicate any recent Medical Services you may have received from other than [facility] providers in the past year (date may be approximate).     Assessment:   This is a routine wellness examination for Selassie.  Hearing/Vision screen Hearing Screening - Comments:: Adequate hearing Vision Screening - Comments:: Adequate vision: [facility]   Dietary issues and exercise activities discussed: Current Exercise Habits: Home exercise routine, Type of exercise: strength training/weights;walking, Time (Minutes): 45, Frequency (Times/Week): 4, Weekly Exercise (Minutes/Week): 180, Exercise limited by: None identified   Goals Addressed             This Visit's Progress    DIET - INCREASE WATER INTAKE   On track    Recommend to drink at least 6-8 8oz glasses of water per day.       Depression Screen    01/26/2023    8:16 AM 10/04/2022    3:37 PM 07/07/2021    8:37 AM 12/14/2020    7:59 AM 10/21/2020    8:26 AM 08/09/2020    7:35 AM 06/29/2020    9:41 AM  PHQ 2/9 Scores  PHQ - 2 Score 0 0 0 0 0 0 1  PHQ- 9 Score  0    2 1    Fall Risk    01/26/2023    8:11 AM 07/07/2021    8:39 AM 12/14/2020    7:59 AM 10/28/2020   10:51 AM 10/21/2020    8:26 AM  Fall Risk   Falls in the past year? 0 0 0 0 0  Number falls in past yr: 0 0 0 0 0  Injury with Fall? 0 0 0 0 0  Risk for fall due to : No Fall Risks No Fall Risks     Follow up Education provided;Falls prevention discussed Falls prevention discussed       FALL RISK  PREVENTION PERTAINING TO THE HOME:  Any stairs in or around the home? Yes  If so, are there any without handrails? Yes  Home free of loose throw rugs in walkways, pet beds, electrical cords, etc? Yes  Adequate lighting in your home to reduce risk of falls? Yes   ASSISTIVE DEVICES UTILIZED TO PREVENT FALLS:  Life alert? No  Use of a cane, walker or w/c? No  Grab bars in the bathroom? Yes  Shower chair or bench in shower? Yes does not use Elevated toilet seat or a handicapped toilet? No   TIMED UP AND GO:  Was the test performed? Yes .  Length of time to ambulate 10 feet: 12 sec.   Gait steady and fast without use of assistive device  Cognitive Function:        01/26/2023    8:26 AM 06/29/2020    9:49 AM 06/13/2019   10:48 AM 06/07/2018    7:55 AM  6CIT Screen  What Year? 0 points 0 points 0 points 0 points  What month? 0 points 0 points 0 points 0 points  What time? 0 points 0 points 0 points 0 points  Count back from 20 0 points 0 points 0 points 0 points  Months in reverse 0 points 0 points 0 points 0 points  Repeat phrase 0 points 0 points 0 points 8 points  Total Score 0 points 0 points 0 points 8 points    Immunizations Immunization History  Administered Date(s) Administered   Fluad Quad(high Dose 65+) 10/09/2019, 10/08/2020, 10/20/2021, 10/04/2022   Influenza, High Dose Seasonal PF 09/03/2017, 09/23/2018   Influenza, Seasonal, Injecte, Preservative Fre 09/30/2012, 10/21/2014   Influenza,inj,Quad PF,6+ Mos 09/09/2015   Influenza-Unspecified 11/17/2002, 01/06/2004, 11/22/2004, 11/06/2013   PFIZER(Purple Top)SARS-COV-2 Vaccination 02/20/2020, 03/12/2020, 05/09/2021   PNEUMOCOCCAL CONJUGATE-20 10/04/2022   Pneumococcal Conjugate-13 06/13/2017   Pneumococcal Polysaccharide-23 09/30/2012, 06/24/2014   Td 06/24/2014   Tdap 01/21/2013   Zoster Recombinat (Shingrix) 07/22/2019, 12/29/2019   Zoster, Live 03/17/2013    TDAP status: Up to date  Flu Vaccine status: Up  to date  Pneumococcal vaccine status: Up to date  Covid-19 vaccine status: Completed vaccines  Qualifies for Shingles Vaccine? Yes   Zostavax completed Yes   Shingrix Completed?: Yes  Screening Tests Health Maintenance  Topic Date Due   COVID-19 Vaccine (4 - 2023-24 season) 08/25/2022   Medicare Annual Wellness (AWV)  01/27/2024   DTaP/Tdap/Td (3 - Td or Tdap) 06/24/2024   COLONOSCOPY (Pts 45-63yr Insurance coverage will need to be confirmed)  07/28/2024   Pneumonia Vaccine 73 Years old  Completed   INFLUENZA VACCINE  Completed   Hepatitis C Screening  Completed   Zoster Vaccines- Shingrix  Completed   HPV VACCINES  Aged Out    Health Maintenance  Health Maintenance Due  Topic Date Due   COVID-19 Vaccine (4 - 2023-24 season) 08/25/2022    Colorectal cancer screening: Type of screening: Colonoscopy. Completed yes. Repeat every 5 years  Lung Cancer Screening: (Low Dose CT Chest recommended if Age 73-80years, 30 pack-year currently smoking OR have quit w/in 15years.) does not qualify.   Lung Cancer Screening Referral: no  Additional Screening:  Hepatitis C Screening: does not qualify; Completed no  Vision Screening:  Recommended annual ophthalmology exams for early detection of glaucoma and other disorders of the eye. Is the patient up to date with their annual eye exam?  No  Who is the provider or what is the name of the office in which the patient attends [facility] exams[facility] If pt is not established with a provider, would they like to be referred to a provider to establish care? No .   Dental Screening: Recommended annual dental exams for proper oral hygiene  Community Resource Referral / Chronic Care Management: CRR required this visit?  No   CCM required this visit?  No      Plan:     I have personally reviewed and noted the following in the patient's chart:   Medical and social history Use of alcohol, tobacco or illicit drugs  Current medications and  supplements including opioid prescriptions. Patient is not currently taking opioid prescriptions. Functional ability and status Nutritional status Physical activity Advanced directives List of other physicians Hospitalizations, surgeries, and ER visits in previous 12 months Vitals Screenings to include cognitive, depression, and falls Referrals and appointments  In addition, I have reviewed and discussed with patient certain preventive protocols, quality metrics, and best practice recommendations. A written personalized care plan for preventive services as well as general preventive health recommendations were provided to patient.     Roger Shelter, LPN   08/27/4267   Nurse Notes: none

## 2023-01-26 NOTE — Patient Instructions (Signed)
Mr. Jerry Pena , Thank you for taking time to come for your Medicare Wellness Visit. I appreciate your ongoing commitment to your health goals. Please review the following plan we discussed and let me know if I can assist you in the future.   These are the goals we discussed:  Goals      DIET - INCREASE WATER INTAKE     Recommend to drink at least 6-8 8oz glasses of water per day.     Quit Smoking     Pt started smoking again as a result of dealing with urinary incontinence from prostate surgery and would like to be able to quit.   If you wish to quit smoking, help is available. For free tobacco cessation program offerings call the [facility] at 650-547-0386 or Live Well Line at [phone]. You may also visit [email or web address] or email [email or web address] for more information on other programs.          This is a list of the screening recommended for you and due dates:  Health Maintenance  Topic Date Due   COVID-19 Vaccine (4 - 2023-24 season) 08/25/2022   Medicare Annual Wellness Visit  01/27/2024   DTaP/Tdap/Td vaccine (3 - Td or Tdap) 06/24/2024   Colon Cancer Screening  07/28/2024   Pneumonia Vaccine  Completed   Flu Shot  Completed   Hepatitis C Screening: USPSTF Recommendation to screen - Ages 18-79 yo.  Completed   Zoster (Shingles) Vaccine  Completed   HPV Vaccine  Aged Out    Advanced directives: yes  Conditions/risks identified: non  Next appointment: Follow up in one year for your annual wellness visit. 02/03/2024 '@3'$ :15pm/in-person  Preventive Care 65 Years and Older, Male  Preventive care refers to lifestyle choices and visits with your health care provider that can promote health and wellness. What does preventive care include? A yearly physical exam. This is also called an annual well check. Dental exams once or twice a year. Routine eye exams. Ask your health care provider how often you should have your eyes checked. Personal  lifestyle choices, including: Daily care of your teeth and gums. Regular physical activity. Eating a healthy diet. Avoiding tobacco and drug use. Limiting alcohol use. Practicing safe sex. Taking low doses of aspirin every day. Taking vitamin and mineral supplements as recommended by your health care provider. What happens during an annual well check? The services and screenings done by your health care provider during your annual well check will depend on your age, overall health, lifestyle risk factors, and family history of disease. Counseling  Your health care provider may ask you questions about your: Alcohol use. Tobacco use. Drug use. Emotional well-being. Home and relationship well-being. Sexual activity. Eating habits. History of falls. Memory and ability to understand (cognition). Work and work Statistician. Screening  You may have the following tests or measurements: Height, weight, and BMI. Blood pressure. Lipid and cholesterol levels. These may be checked every 5 years, or more frequently if you are over 73 years old. Skin check. Lung cancer screening. You may have this screening every year starting at age 73 if you have a 30-pack-year history of smoking and currently smoke or have quit within the past 15 years. Fecal occult blood test (FOBT) of the stool. You may have this test every year starting at age 73. Flexible sigmoidoscopy or colonoscopy. You may have a sigmoidoscopy every 5 years or a colonoscopy every 10 years starting at age 73. Prostate cancer screening. Recommendations  will vary depending on your family history and other risks. Hepatitis C blood test. Hepatitis B blood test. Sexually transmitted disease (STD) testing. Diabetes screening. This is done by checking your blood sugar (glucose) after you have not eaten for a while (fasting). You may have this done every 1-3 years. Abdominal aortic aneurysm (AAA) screening. You may need this if you are a  current or former smoker. Osteoporosis. You may be screened starting at age 73 if you are at high risk. Talk with your health care provider about your test results, treatment options, and if necessary, the need for more tests. Vaccines  Your health care provider may recommend certain vaccines, such as: Influenza vaccine. This is recommended every year. Tetanus, diphtheria, and acellular pertussis (Tdap, Td) vaccine. You may need a Td booster every 10 years. Zoster vaccine. You may need this after age 73. Pneumococcal 13-valent conjugate (PCV13) vaccine. One dose is recommended after age 73. Pneumococcal polysaccharide (PPSV23) vaccine. One dose is recommended after age 73. Talk to your health care provider about which screenings and vaccines you need and how often you need them. This information is not intended to replace advice given to you by your health care provider. Make sure you discuss any questions you have with your health care provider. Document Released: 01/07/2016 Document Revised: 08/30/2016 Document Reviewed: 10/12/2015 Elsevier Interactive Patient Education  2017 [place] Prevention in the Home Falls can cause injuries. They can happen to people of all ages. There are many things you can do to make your home safe and to help prevent falls. What can I do on the outside of my home? Regularly fix the edges of walkways and driveways and fix any cracks. Remove anything that might make you trip as you walk through a door, such as a raised step or threshold. Trim any bushes or trees on the path to your home. Use bright outdoor lighting. Clear any walking paths of anything that might make someone trip, such as rocks or tools. Regularly check to see if handrails are loose or broken. Make sure that both sides of any steps have handrails. Any raised decks and porches should have guardrails on the edges. Have any leaves, snow, or ice cleared regularly. Use sand or salt on  walking paths during winter. Clean up any spills in your garage right away. This includes oil or grease spills. What can I do in the bathroom? Use night lights. Install grab bars by the toilet and in the tub and shower. Do not use towel bars as grab bars. Use non-skid mats or decals in the tub or shower. If you need to sit down in the shower, use a plastic, non-slip stool. Keep the floor dry. Clean up any water that spills on the floor as soon as it happens. Remove soap buildup in the tub or shower regularly. Attach bath mats securely with double-sided non-slip rug tape. Do not have throw rugs and other things on the floor that can make you trip. What can I do in the bedroom? Use night lights. Make sure that you have a light by your bed that is easy to reach. Do not use any sheets or blankets that are too big for your bed. They should not hang down onto the floor. Have a firm chair that has side arms. You can use this for support while you get dressed. Do not have throw rugs and other things on the floor that can make you trip. What can I do  in the kitchen? Clean up any spills right away. Avoid walking on wet floors. Keep items that you use a lot in easy-to-reach places. If you need to reach something above you, use a strong step stool that has a grab bar. Keep electrical cords out of the way. Do not use floor polish or wax that makes floors slippery. If you must use wax, use non-skid floor wax. Do not have throw rugs and other things on the floor that can make you trip. What can I do with my stairs? Do not leave any items on the stairs. Make sure that there are handrails on both sides of the stairs and use them. Fix handrails that are broken or loose. Make sure that handrails are as long as the stairways. Check any carpeting to make sure that it is firmly attached to the stairs. Fix any carpet that is loose or worn. Avoid having throw rugs at the top or bottom of the stairs. If you do  have throw rugs, attach them to the floor with carpet tape. Make sure that you have a light switch at the top of the stairs and the bottom of the stairs. If you do not have them, ask someone to add them for you. What else can I do to help prevent falls? Wear shoes that: Do not have high heels. Have rubber bottoms. Are comfortable and fit you well. Are closed at the toe. Do not wear sandals. If you use a stepladder: Make sure that it is fully opened. Do not climb a closed stepladder. Make sure that both sides of the stepladder are locked into place. Ask someone to hold it for you, if possible. Clearly mark and make sure that you can see: Any grab bars or handrails. First and last steps. Where the edge of each step is. Use tools that help you move around (mobility aids) if they are needed. These include: Canes. Walkers. Scooters. Crutches. Turn on the lights when you go into a dark area. Replace any light bulbs as soon as they burn out. Set up your furniture so you have a clear path. Avoid moving your furniture around. If any of your floors are uneven, fix them. If there are any pets around you, be aware of where they are. Review your medicines with your doctor. Some medicines can make you feel dizzy. This can increase your chance of falling. Ask your doctor what other things that you can do to help prevent falls. This information is not intended to replace advice given to you by your health care provider. Make sure you discuss any questions you have with your health care provider. Document Released: 10/07/2009 Document Revised: 05/18/2016 Document Reviewed: 01/15/2015 Elsevier Interactive Patient Education  2017 [employer].

## 2023-02-04 IMAGING — CR DG CHEST 2V
2 series · 2 of 2 positions shown · non-contrast
Comparison: NM pet 01/17/2018.

CLINICAL DATA: Radiation cystitis.

EXAM:
CHEST - 2 VIEW

[chest pa]
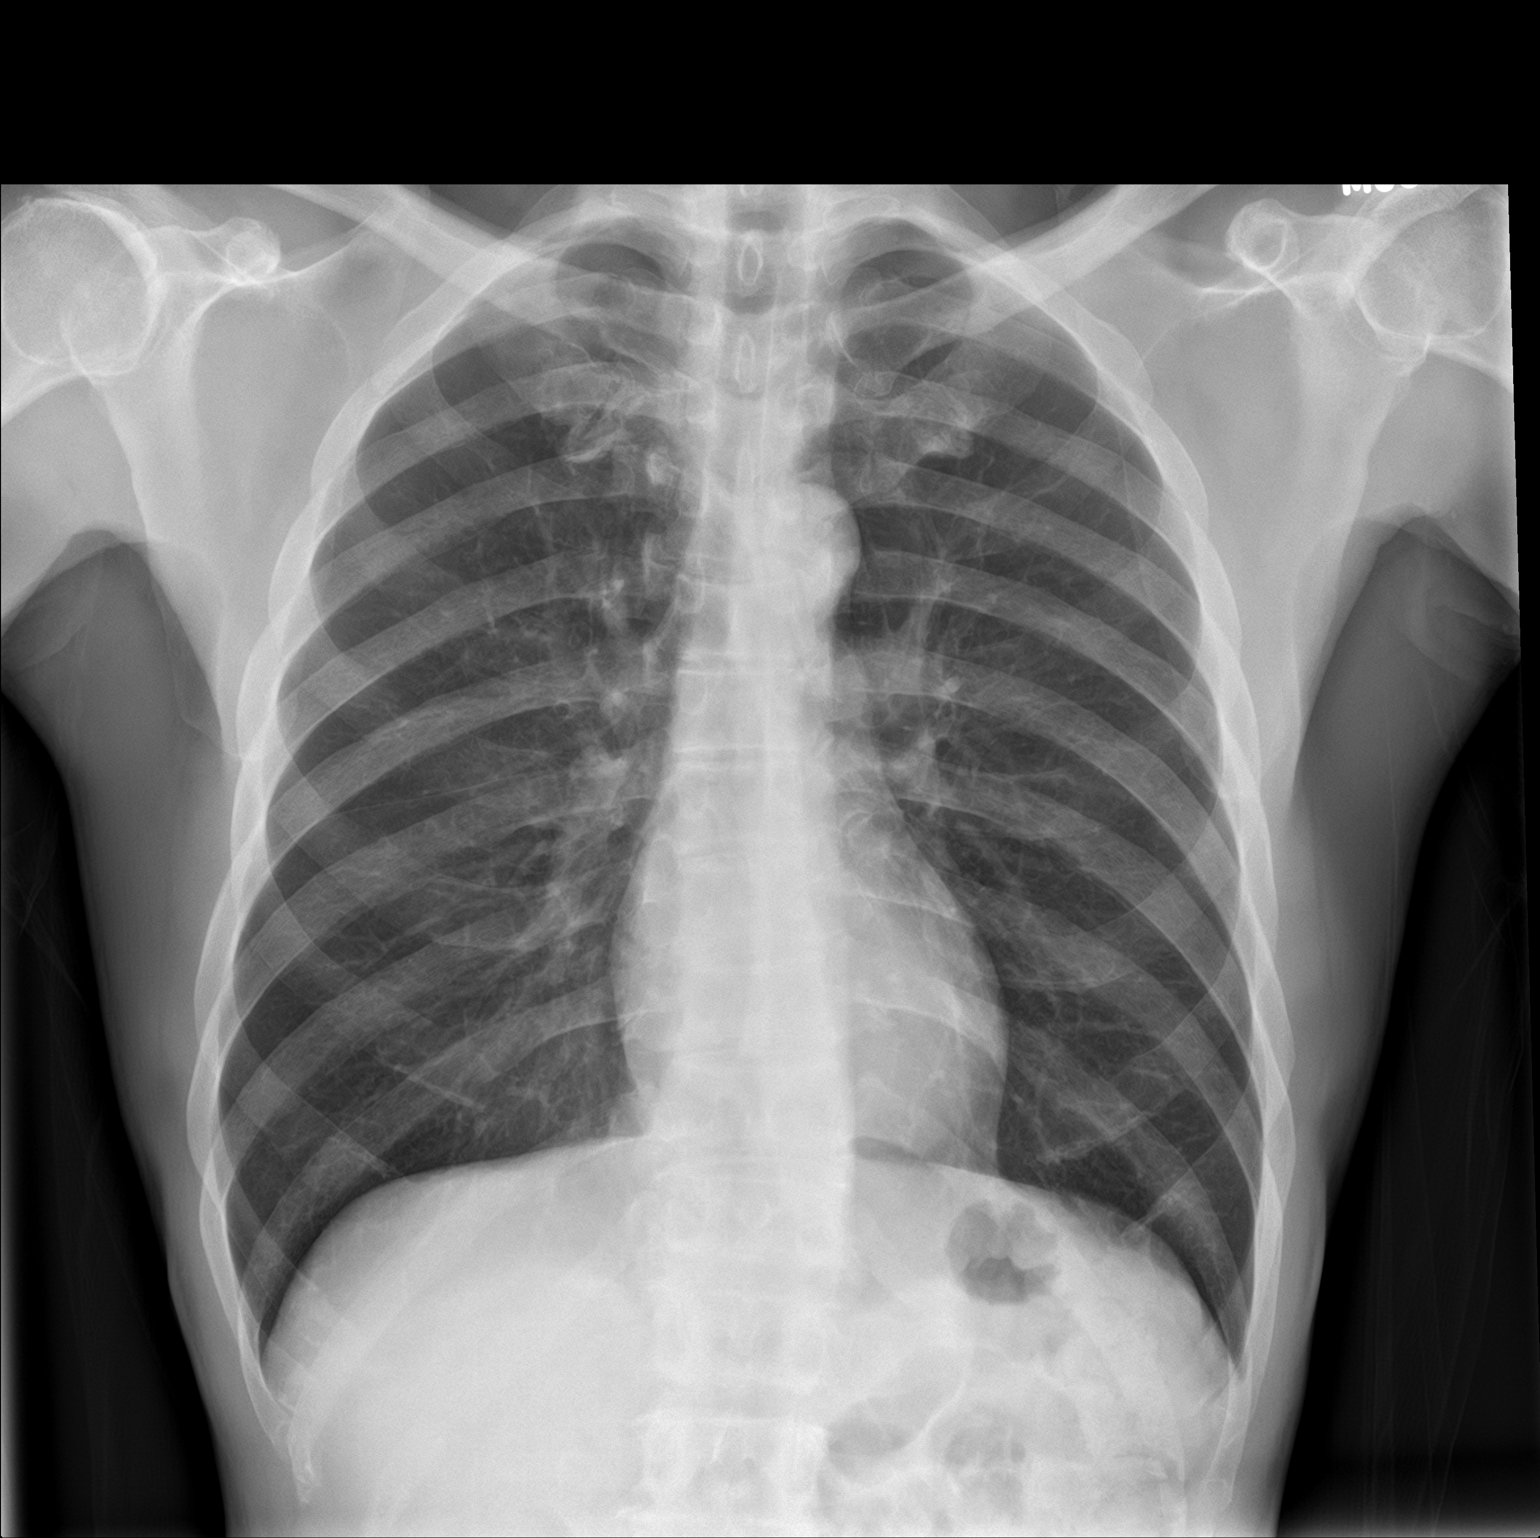

[chest lat]
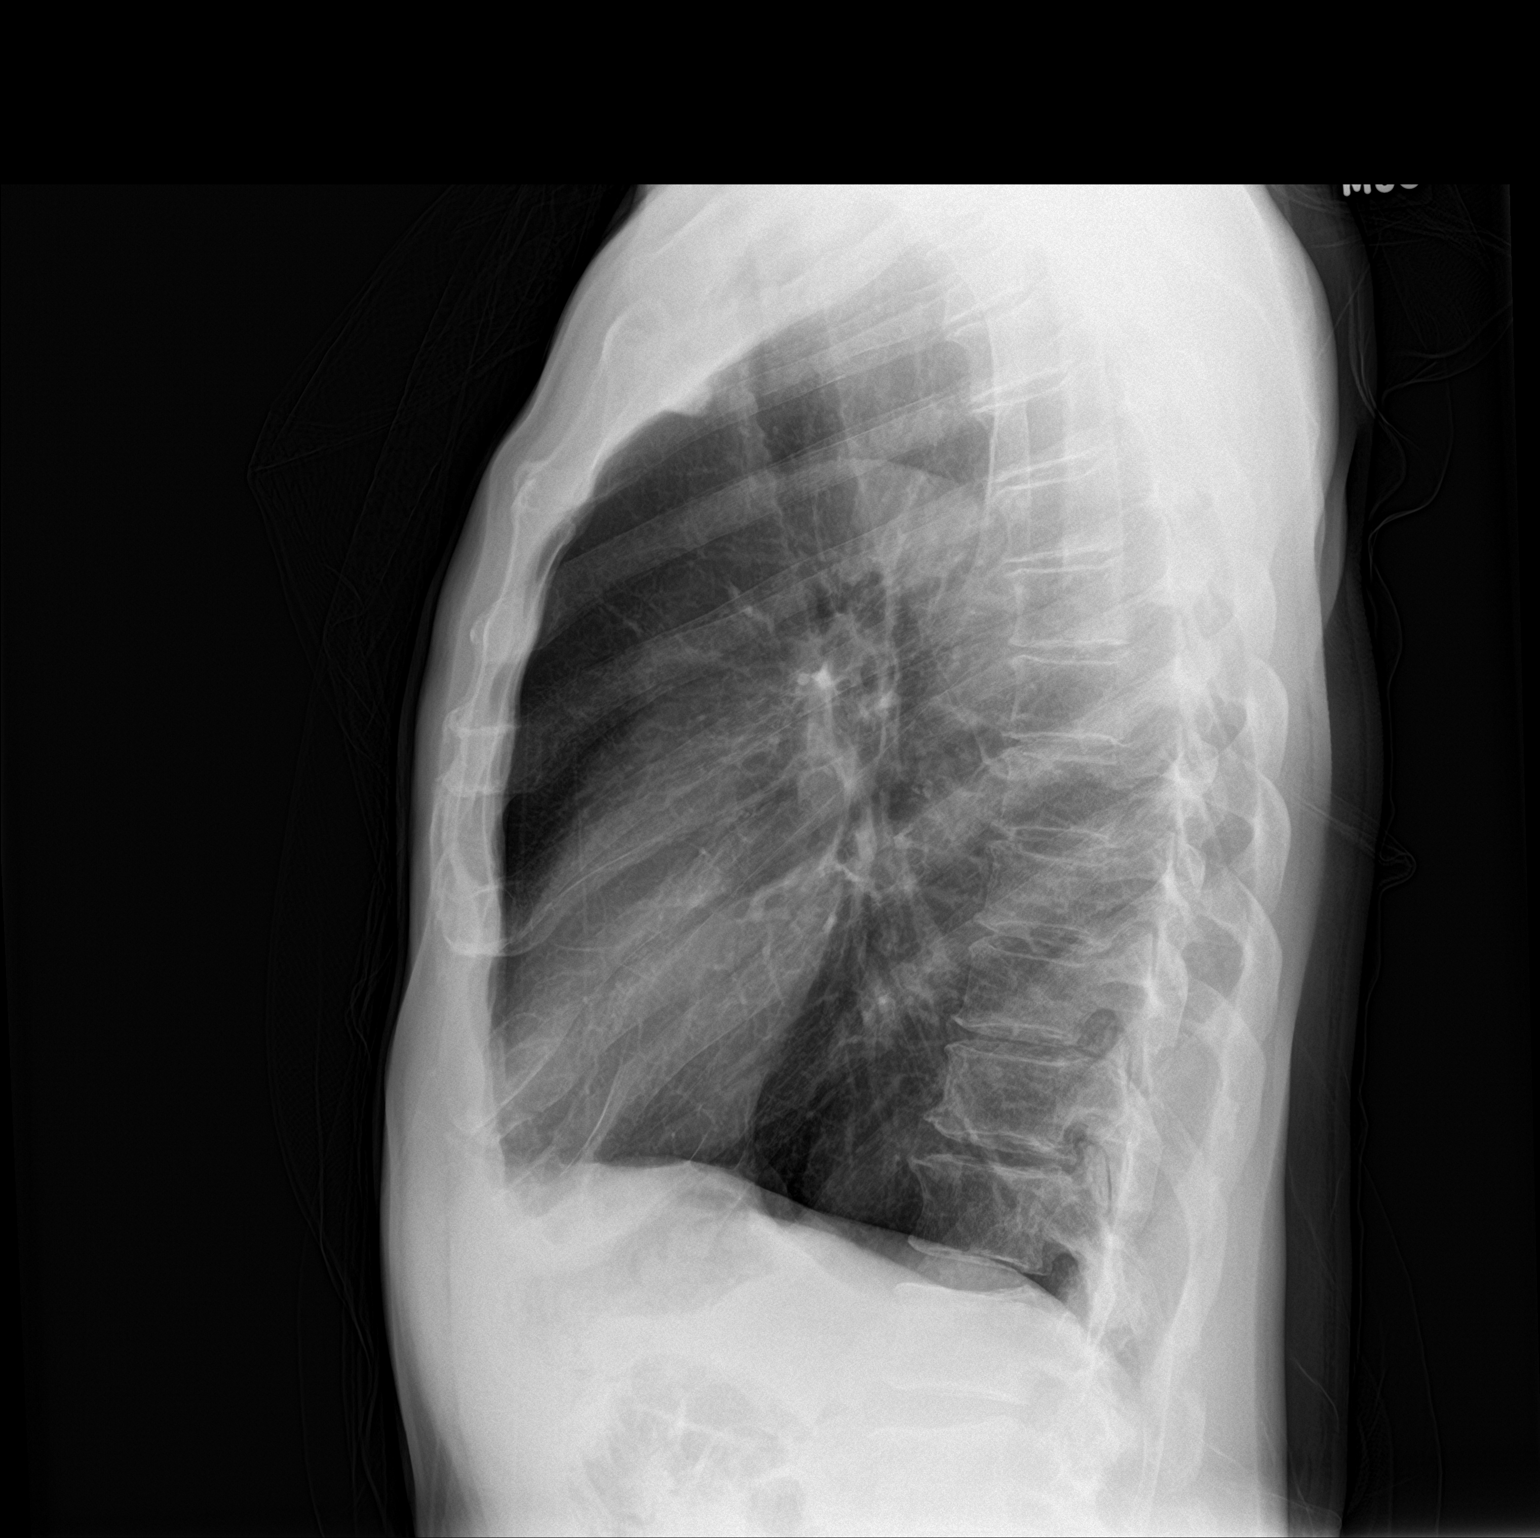

[2 of 2 positions shown; findings below may reference images not displayed]

FINDINGS: The heart size and mediastinal contours are within normal limits.
Both lungs are clear. The visualized skeletal structures are
unremarkable.
IMPRESSION: No active cardiopulmonary disease.

## 2023-04-24 ENCOUNTER — Other Ambulatory Visit: Payer: 59

## 2023-04-24 DIAGNOSIS — C61 Malignant neoplasm of prostate: Secondary | ICD-10-CM

## 2023-04-25 ENCOUNTER — Ambulatory Visit: Payer: Medicare Other | Admitting: Urology

## 2023-04-25 LAB — PSA: Prostate Specific Ag, Serum: <0.1 ng/mL (ref 0.0–4.0)

## 2023-05-01 ENCOUNTER — Ambulatory Visit (INDEPENDENT_AMBULATORY_CARE_PROVIDER_SITE_OTHER): Payer: 59 | Admitting: Urology

## 2023-05-01 VITALS — BP 139/88 | HR 74 | Ht 68.0 in | Wt 131.5 lb

## 2023-05-01 DIAGNOSIS — Z8546 Personal history of malignant neoplasm of prostate: Secondary | ICD-10-CM

## 2023-05-01 DIAGNOSIS — N393 Stress incontinence (female) (male): Secondary | ICD-10-CM | POA: Diagnosis not present

## 2023-05-01 DIAGNOSIS — C61 Malignant neoplasm of prostate: Secondary | ICD-10-CM

## 2023-05-01 DIAGNOSIS — N529 Male erectile dysfunction, unspecified: Secondary | ICD-10-CM | POA: Diagnosis not present

## 2023-05-01 NOTE — Progress Notes (Signed)
I, Jerry Pena, acting as a scribe for Jerry Scotland, MD.,have documented all relevant documentation on the behalf of Jerry Scotland, MD,as directed by  Jerry Scotland, MD while in the presence of Jerry Scotland, MD.  05/01/2023 10:30 AM   Jerry Pena 11-29-1951 [id number]  Referring provider: Berniece Salines, FNP [address] [place],  [place] [address]  Chief Complaint  Patient presents with   Prostate Cancer    Follow up    HPI: 72 year-old male with a personal history of prostate cancer, gross hematuria related to radiation cystitis, and urinary incontinence returns today for annual follow-up.   He is s/p prostatectomy/salvage XRT. Surgical pathology consistent with Gleason 4+3 prostate cancer since of extraprostatic extension but negative margins.  In addition to this, 1 of 4 nodes from the right pelvic lymph nodes including external iliac and obturator positive, 0 of 2 on the left. pT3a N1 Mx.   Ultimately, his PSA began to rise up to 0.2 and he underwent whole pelvic radiation in 2019.  He also received one 57-month Depo of Lupron at that time refused any further secondary to side effects.    He underwent a biopsy on 2/282022 that revealed pathology consistent with radiation cystitis.   His most recent PSA from 04/24/2023 remains undetectable.   He said he never had to have hyperbaric oxygen because it stopped. He reports leakage being consistent and he tolerates it. He is not interested in any surgical options. He is not taking anything for ED right now and has done the injections in the past because the Viagra didn't help.    PMH: Past Medical History:  Diagnosis Date   Beta thalassemia trait 09/27/2018   Cancer (HCC) 10/2017   Prostate   DDD (degenerative disc disease), lumbar 06/13/2017   Lumbar imaging June 2018   Elevated PSA 09/12/2017   Refer to urologist, Sept 2018   Facet hypertrophy of lumbar region 06/13/2017   Lumbar imaging June 2018    GERD (gastroesophageal reflux disease)    occ no meds   Hyperlipidemia     Surgical History: Past Surgical History:  Procedure Laterality Date   CATARACT EXTRACTION W/ INTRAOCULAR LENS IMPLANT Left 3/ 28/2014   eyes   CATARACT EXTRACTION W/ INTRAOCULAR LENS IMPLANT Right 04/23/2013   COLONOSCOPY WITH PROPOFOL N/A 07/29/2019   Procedure: COLONOSCOPY WITH PROPOFOL;  Surgeon: Toney Reil, MD;  Location: [facility] ENDOSCOPY;  Service: Gastroenterology;  Laterality: N/A;   CYSTOSCOPY W/ RETROGRADES Bilateral 02/21/2021   Procedure: CYSTOSCOPY WITH RETROGRADE PYELOGRAM;  Surgeon: Jerry Scotland, MD;  Location: [facility] ORS;  Service: Urology;  Laterality: Bilateral;   CYSTOSCOPY WITH BIOPSY N/A 02/21/2021   Procedure: CYSTOSCOPY WITH BIOPSY and fulgeration;  Surgeon: Jerry Scotland, MD;  Location: [facility] ORS;  Service: Urology;  Laterality: N/A;   FLEXIBLE SIGMOIDOSCOPY N/A 11/14/2019   Procedure: FLEXIBLE SIGMOIDOSCOPY;  Surgeon: Toney Reil, MD;  Location: [facility] ENDOSCOPY;  Service: Gastroenterology;  Laterality: N/A;   PELVIC LYMPH NODE DISSECTION N/A 12/10/2017   Procedure: PELVIC LYMPH NODE DISSECTION;  Surgeon: Jerry Scotland, MD;  Location: [facility] ORS;  Service: Urology;  Laterality: N/A;   PROSTATE SURGERY     ROBOT ASSISTED LAPAROSCOPIC RADICAL PROSTATECTOMY N/A 12/10/2017   Procedure: ROBOTIC ASSISTED LAPAROSCOPIC RADICAL PROSTATECTOMY;  Surgeon: Jerry Scotland, MD;  Location: [facility] OR[facility];  Service: Urology;  Laterality: N/A;    Home Medications:  Allergies as of 05/01/2023       Reactions   Aspirin Other (See  Comments)   Upset stomach, indigestion and heart burn   Penicillins Hives, Other (See Comments)   Has patient had a PCN reaction causing immediate rash, facial/tongue/throat swelling, SOB or lightheadedness with hypotension: Yes Has patient had a PCN reaction causing severe rash involving mucus membranes or skin necrosis: No Has patient had a PCN reaction that required  hospitalization: Yes Has patient had a PCN reaction occurring within the last 10 years: No If all of the above answers are "NO", then may proceed with Cephalosporin use.        Medication List        Accurate as of May 01, 2023 10:30 AM. If you have any questions, ask your nurse or doctor.          acetaminophen 500 MG tablet Commonly known as: TYLENOL Take 1,000 mg by mouth every 6 (six) hours as needed for moderate pain or headache.   ascorbic acid 500 MG tablet Commonly known as: VITAMIN C Take 500 mg by mouth daily.   calcium-vitamin D 500-200 MG-UNIT tablet Commonly known as: OSCAL WITH D Take 1 tablet by mouth daily.   docusate sodium 100 MG capsule Commonly known as: COLACE Take 100 mg by mouth daily.   folic acid 400 MCG tablet Commonly known as: FOLVITE Take 1 tablet (400 mcg total) by mouth daily.   ibuprofen 200 MG tablet Commonly known as: ADVIL Take 400 mg by mouth every 6 (six) hours as needed for headache or moderate pain.   melatonin 5 MG Tabs Take 5 mg by mouth daily as needed.   multivitamin tablet Take 1 tablet by mouth daily.   NONFORMULARY OR COMPOUNDED ITEM Super Trimix (30/2/20)-(Pap/Phent/PGE)  Dosage: Inject 0.5 cc per injection Titrate up to 1cc as needed   Vial 1ml  Qty #10 Refills 6  [facility] [phone] Fax [phone]   polyethylene glycol 17 g packet Commonly known as: MIRALAX / GLYCOLAX Take 17 g by mouth daily.   vitamin B-12 100 MCG tablet Commonly known as: CYANOCOBALAMIN Take 100 mcg by mouth daily.        Allergies:  Allergies  Allergen Reactions   Aspirin Other (See Comments)    Upset stomach, indigestion and heart burn   Penicillins Hives and Other (See Comments)    Has patient had a PCN reaction causing immediate rash, facial/tongue/throat swelling, SOB or lightheadedness with hypotension: Yes Has patient had a PCN reaction causing severe rash involving mucus membranes or skin  necrosis: No Has patient had a PCN reaction that required hospitalization: Yes Has patient had a PCN reaction occurring within the last 10 years: No If all of the above answers are "NO", then may proceed with Cephalosporin use.     Family History: Family History  Problem Relation Age of Onset   Stroke Mother    Stroke Father    Cancer Sister        breast, lung   Pulmonary Hypertension Sister    Cancer Son        prostate   Prostate cancer Son    Hypertension Sister    Bladder Cancer Neg Hx    Kidney cancer Neg Hx     Social History:  reports that he has been smoking cigars. He has never used smokeless tobacco. He reports current alcohol use. He reports that he does not use drugs.   Physical Exam: BP 139/88   Pulse 74   Ht 5\' 8"  (1.727 m)   Wt 131 lb 8 oz (  59.6 kg)   BMI 19.99 kg/m   Constitutional:  Alert and oriented, No acute distress. HEENT: [place] AT, moist mucus membranes.  Trachea midline, no masses. Neurologic: Grossly intact, no focal deficits, moving all 4 extremities. Psychiatric: Normal mood and affect.   Assessment & Plan:    Prostate cancer  - No evidence of disease -Continue to check PSA on a every 6 month basis  2. Stress incontinence  - Stable; minimal bother; doesn't desire any further intervention.  3. Erectile Dysfunction  - Uses intracavernosal injections as needed. Will let us know when he needs a refill.   Return in about 6 months (around 11/01/2023) for PSA.  I have reviewed the above documentation for accuracy and completeness, and I agree with the above.   Jerry Scotland, MD    [facility] [address], [address] [place], [place] [address] [phone]

## 2023-10-09 NOTE — Progress Notes (Unsigned)
Name: Jerry Pena   MRN: [id number]    DOB: 1951-11-05   Date:10/10/2023       Progress Note  Subjective  Chief Complaint  Chief Complaint  Patient presents with  . Annual Exam    HPI  Patient presents for annual CPE .  IPSS Questionnaire (AUA-7): Over the past month.   1)  How often have you had a sensation of not emptying your bladder completely after you finish urinating?  0 - Not at all  2)  How often have you had to urinate again less than two hours after you finished urinating? 0 - Not at all  3)  How often have you found you stopped and started again several times when you urinated?  0 - Not at all  4) How difficult have you found it to postpone urination?  0 - Not at all  5) How often have you had a weak urinary stream?  0 - Not at all  6) How often have you had to push or strain to begin urination?  0 - Not at all  7) How many times did you most typically get up to urinate from the time you went to bed until the time you got up in the morning?  0 - None  Total score:  0-7 mildly symptomatic   8-19 moderately symptomatic   20-35 severely symptomatic     Diet: Regular, well balanced diet Exercise: 4 days 45 minutes Last Dental Exam: years Last Eye Exam: years  Depression: phq 9 is negative    10/10/2023    1:54 PM 01/26/2023    8:16 AM 10/04/2022    3:37 PM 07/07/2021    8:37 AM 12/14/2020    7:59 AM  Depression screen PHQ 2/9  Decreased Interest 0 0 0 0 0  Down, Depressed, Hopeless 0 0 0 0 0  PHQ - 2 Score 0 0 0 0 0  Altered sleeping   0    Tired, decreased energy   0    Change in appetite   0    Feeling bad or failure about yourself    0    Trouble concentrating   0    Moving slowly or fidgety/restless   0    Suicidal thoughts   0    PHQ-9 Score   0    Difficult doing work/chores   Not difficult at all      Hypertension:  BP Readings from Last 3 Encounters:  10/10/23 120/70  05/01/23 139/88  01/26/23 116/66    Obesity: Wt Readings from Last 3  Encounters:  10/10/23 133 lb 8 oz (60.6 kg)  05/01/23 131 lb 8 oz (59.6 kg)  01/26/23 130 lb 6.4 oz (59.1 kg)   BMI Readings from Last 3 Encounters:  10/10/23 20.00 kg/m  05/01/23 19.99 kg/m  01/26/23 19.54 kg/m     Lipids:  Lab Results  Component Value Date   CHOL 157 10/04/2022   CHOL 165 06/22/2020   CHOL 157 06/13/2019   Lab Results  Component Value Date   HDL 67 10/04/2022   HDL 71 06/22/2020   HDL 61 06/13/2019   Lab Results  Component Value Date   LDLCALC 76 10/04/2022   LDLCALC 78 06/22/2020   LDLCALC 77 06/13/2019   Lab Results  Component Value Date   TRIG 60 10/04/2022   TRIG 77 06/22/2020   TRIG 104 06/13/2019   Lab Results  Component Value Date   CHOLHDL 2.3 10/04/2022  CHOLHDL 2.3 06/22/2020   CHOLHDL 2.6 06/13/2019   No results found for: "LDLDIRECT" Glucose:  Glucose, Bld  Date Value Ref Range Status  10/04/2022 93 65 - 99 mg/dL Final    Comment:    .            Fasting reference interval .   02/17/2021 96 70 - 99 mg/dL Final    Comment:    Glucose reference range applies only to samples taken after fasting for at least 8 hours.  10/04/2020 97 70 - 99 mg/dL Final    Comment:    Glucose reference range applies only to samples taken after fasting for at least 8 hours.    Flowsheet Row Office Visit from 10/10/2023 in [facility]  AUDIT-C Score 0       Divorced STD testing and prevention (HIV/chl/gon/syphilis):  no completed Sexual history: not currently Hep C Screening: completed Skin cancer: Discussed monitoring for atypical lesions Colorectal cancer: 07/29/2019, Sigmoid on 11/14/19 Prostate cancer:  no (prostate removal) Lab Results  Component Value Date   PSA 4.4 (H) 09/03/2017     Lung cancer:  Low Dose CT Chest recommended if Age 73-80 years, 30 pack-year currently smoking OR have quit w/in 15years. Patient  no a candidate for screening   AAA: The USPSTF recommends one-time screening with  ultrasonography in men ages 70 to 75 years who have ever smoked. Patient   not applicable, a candidate for screening  ECG:  02/22/2021  Vaccines:  Tdap: 01/21/2023 Shingrix: 03/18/2023, 12/29/2019, 07/22/2019 Pneumonia: 10/04/2022, 06/13/2017 COVID-19:05/09/2021, 03/12/2020, 02/20/2020  Advanced Care Planning: A voluntary discussion about advance care planning including the explanation and discussion of advance directives.  Discussed health care proxy and Living will, and the patient was able to identify a health care proxy as Cedric Lona.  Patient does not have a living will and power of attorney of health care   Patient Active Problem List   Diagnosis Date Noted  . Impingement syndrome of shoulder region 07/07/2021  . Osteoarthritis of right shoulder 07/07/2021  . Pain in joint, hand 07/07/2021  . Lipoma of shoulder 07/07/2021  . Non-recurrent unilateral inguinal hernia without obstruction or gangrene 10/21/2020  . Chronic constipation 10/14/2019  . Rectal bleeding 10/14/2019  . Radiation proctitis   . Migraine 01/23/2019  . Cataract 01/23/2019  . Beta thalassemia trait 09/27/2018  . Neutropenia (HCC) 09/23/2018  . History of hormone therapy 06/07/2018  . Prostate cancer (HCC) 12/10/2017  . Facet hypertrophy of lumbar region 06/13/2017  . DDD (degenerative disc disease), lumbar 06/13/2017  . Hyperlipidemia 09/09/2015  . Sciatica 09/09/2015  . Arthritis, lumbar spine 09/09/2015  . Tobacco abuse 09/09/2015  . History of colonic polyps 09/09/2015    Past Surgical History:  Procedure Laterality Date  . CATARACT EXTRACTION W/ INTRAOCULAR LENS IMPLANT Left 3/ 28/2014   eyes  . CATARACT EXTRACTION W/ INTRAOCULAR LENS IMPLANT Right 04/23/2013  . COLONOSCOPY WITH PROPOFOL N/A 07/29/2019   Procedure: COLONOSCOPY WITH PROPOFOL;  Surgeon: Toney Reil, MD;  Location: [facility] ENDOSCOPY;  Service: Gastroenterology;  Laterality: N/A;  . CYSTOSCOPY W/ RETROGRADES Bilateral 02/21/2021    Procedure: CYSTOSCOPY WITH RETROGRADE PYELOGRAM;  Surgeon: Vanna Scotland, MD;  Location: [facility] ORS;  Service: Urology;  Laterality: Bilateral;  . CYSTOSCOPY WITH BIOPSY N/A 02/21/2021   Procedure: CYSTOSCOPY WITH BIOPSY and fulgeration;  Surgeon: Vanna Scotland, MD;  Location: [facility] ORS;  Service: Urology;  Laterality: N/A;  . FLEXIBLE SIGMOIDOSCOPY N/A 11/14/2019   Procedure: FLEXIBLE  SIGMOIDOSCOPY;  Surgeon: Toney Reil, MD;  Location: [facility] ENDOSCOPY;  Service: Gastroenterology;  Laterality: N/A;  . PELVIC LYMPH NODE DISSECTION N/A 12/10/2017   Procedure: PELVIC LYMPH NODE DISSECTION;  Surgeon: Vanna Scotland, MD;  Location: [facility] ORS;  Service: Urology;  Laterality: N/A;  . PROSTATE SURGERY    . ROBOT ASSISTED LAPAROSCOPIC RADICAL PROSTATECTOMY N/A 12/10/2017   Procedure: ROBOTIC ASSISTED LAPAROSCOPIC RADICAL PROSTATECTOMY;  Surgeon: Vanna Scotland, MD;  Location: [facility] ORS;  Service: Urology;  Laterality: N/A;    Family History  Problem Relation Age of Onset  . Stroke Mother   . Stroke Father   . Cancer Sister        breast, lung  . Pulmonary Hypertension Sister   . Cancer Son        prostate  . Prostate cancer Son   . Hypertension Sister   . Bladder Cancer Neg Hx   . Kidney cancer Neg Hx     Social History   Socioeconomic History  . Marital status: Divorced    Spouse name: Not on file  . Number of children: 4  . Years of education: Not on file  . Highest education level: Associate degree: academic program  Occupational History  . Occupation: Retired  Tobacco Use  . Smoking status: Some Days    Types: Cigars  . Smokeless tobacco: Never  . Tobacco comments:    occasional cigar  Vaping Use  . Vaping status: Never Used  Substance and Sexual Activity  . Alcohol use: Yes    Alcohol/week: 0.0 standard drinks of alcohol    Comment: beer rarely  . Drug use: No    Comment: used marijuana more than 10 years ago  . Sexual activity: Not Currently  Other Topics  Concern  . Not on file  Social History Narrative   Lives alone. Patient just buried his baby sister, Diane,10/2018   Social Determinants of Health   Financial Resource Strain: Medium Risk (10/10/2023)   Overall Financial Resource Strain (CARDIA)   . Difficulty of Paying Living Expenses: Somewhat hard  Food Insecurity: No Food Insecurity (10/10/2023)   Hunger Vital Sign   . Worried About Programme researcher, broadcasting/film/video in the Last Year: Never true   . Ran Out of Food in the Last Year: Never true  Transportation Needs: No Transportation Needs (10/10/2023)   PRAPARE - Transportation   . Lack of Transportation (Medical): No   . Lack of Transportation (Non-Medical): No  Physical Activity: Sufficiently Active (10/10/2023)   Exercise Vital Sign   . Days of Exercise per Week: 4 days   . Minutes of Exercise per Session: 40 min  Stress: No Stress Concern Present (10/10/2023)   [employer] of Occupational Health - Occupational Stress Questionnaire   . Feeling of Stress : Only a little  Social Connections: Moderately Isolated (10/10/2023)   Social Connection and Isolation Panel [NHANES]   . Frequency of Communication with Friends and Family: More than three times a week   . Frequency of Social Gatherings with Friends and Family: More than three times a week   . Attends Religious Services: 1 to 4 times per year   . Active Member of Clubs or Organizations: No   . Attends Banker Meetings: Never   . Marital Status: Divorced  Catering manager Violence: Not At Risk (10/10/2023)   Humiliation, Afraid, Rape, and Kick questionnaire   . Fear of Current or Ex-Partner: No   . Emotionally Abused: No   . Physically  Abused: No   . Sexually Abused: No     Current Outpatient Medications:  .  acetaminophen (TYLENOL) 500 MG tablet, Take 1,000 mg by mouth every 6 (six) hours as needed for moderate pain or headache. , Disp: , Rfl:  .  calcium-vitamin D (OSCAL WITH D) 500-200 MG-UNIT tablet, Take 1  tablet by mouth daily., Disp: , Rfl:  .  docusate sodium (COLACE) 100 MG capsule, Take 100 mg by mouth daily., Disp: , Rfl:  .  folic acid (FOLVITE) 400 MCG tablet, Take 1 tablet (400 mcg total) by mouth daily., Disp: 90 tablet, Rfl: 4 .  ibuprofen (ADVIL,MOTRIN) 200 MG tablet, Take 400 mg by mouth every 6 (six) hours as needed for headache or moderate pain., Disp: , Rfl:  .  Melatonin 5 MG TABS, Take 5 mg by mouth daily as needed., Disp: , Rfl:  .  Multiple Vitamin (MULTIVITAMIN) tablet, Take 1 tablet by mouth daily., Disp: , Rfl:  .  polyethylene glycol (MIRALAX / GLYCOLAX) 17 g packet, Take 17 g by mouth daily., Disp: , Rfl:  .  vitamin B-12 (CYANOCOBALAMIN) 100 MCG tablet, Take 100 mcg by mouth daily., Disp: , Rfl:  .  vitamin C (ASCORBIC ACID) 500 MG tablet, Take 500 mg by mouth daily., Disp: , Rfl:  .  NONFORMULARY OR COMPOUNDED ITEM, Super Trimix (30/2/20)-(Pap/Phent/PGE)  Dosage: Inject 0.5 cc per injection Titrate up to 1cc as needed   Vial 1ml  Qty #10 Refills 6  [facility] [phone] Fax [phone] (Patient not taking: Reported on 10/10/2023), Disp: 10 each, Rfl: 6  Allergies  Allergen Reactions  . Aspirin Other (See Comments)    Upset stomach, indigestion and heart burn  . Penicillins Hives and Other (See Comments)    Has patient had a PCN reaction causing immediate rash, facial/tongue/throat swelling, SOB or lightheadedness with hypotension: Yes Has patient had a PCN reaction causing severe rash involving mucus membranes or skin necrosis: No Has patient had a PCN reaction that required hospitalization: Yes Has patient had a PCN reaction occurring within the last 10 years: No If all of the above answers are "NO", then may proceed with Cephalosporin use.      ROS  Constitutional: Negative for fever or weight change.  Respiratory: Negative for cough and shortness of breath.   Cardiovascular: Negative for chest pain or palpitations.  Gastrointestinal: Negative for  abdominal pain, no bowel changes.  Musculoskeletal: Negative for gait problem or joint swelling.  Skin: Negative for rash.  Neurological: Negative for dizziness or headache.  No other specific complaints in a complete review of systems (except as listed in HPI above).    Objective  Vitals:   10/10/23 1350  BP: 120/70  Pulse: 96  Resp: 16  Temp: 97.7 F (36.5 C)  TempSrc: Oral  SpO2: 98%  Weight: 133 lb 8 oz (60.6 kg)  Height: 5' 8.5" (1.74 m)    Body mass index is 20 kg/m.  Physical Exam Constitutional: Patient appears well-developed and well-nourished. No distress.  HENT: Head: Normocephalic and atraumatic. Ears: B TMs ok, no erythema or effusion; Nose: Nose normal. Mouth/Throat: Oropharynx is clear and moist. No oropharyngeal exudate.  Eyes: Conjunctivae and EOM are normal. Pupils are equal, round, and reactive to light. No scleral icterus.  Neck: Normal range of motion. Neck supple. No JVD present. No thyromegaly present.  Cardiovascular: Normal rate, regular rhythm and normal heart sounds.  No murmur heard. No BLE edema. Pulmonary/Chest: Effort normal and breath sounds normal.  No respiratory distress. Abdominal: Soft. Bowel sounds are normal, no distension. There is no tenderness. Right inguinal hernia noted Musculoskeletal: Normal range of motion, no joint effusions. No gross deformities Neurological: he is alert and oriented to person, place, and time. No cranial nerve deficit. Coordination, balance, strength, speech and gait are normal.  Skin: Skin is warm and dry. No rash noted. No erythema.  Psychiatric: Patient has a normal mood and affect. behavior is normal. Judgment and thought content normal.   No results found for this or any previous visit (from the past 2160 hour(s)).   Fall Risk:    10/10/2023    1:54 PM 01/26/2023    8:11 AM 07/07/2021    8:39 AM 12/14/2020    7:59 AM 10/28/2020   10:51 AM  Fall Risk   Falls in the past year? 0 0 0 0 0  Number falls  in past yr: 0 0 0 0 0  Injury with Fall? 0 0 0 0 0  Risk for fall due to : No Fall Risks No Fall Risks No Fall Risks    Follow up Falls prevention discussed Education provided;Falls prevention discussed Falls prevention discussed       Functional Status Survey: Is the patient deaf or have difficulty hearing?: No Does the patient have difficulty seeing, even when wearing glasses/contacts?: No Does the patient have difficulty concentrating, remembering, or making decisions?: No Does the patient have difficulty walking or climbing stairs?: No Does the patient have difficulty doing errands alone such as visiting a doctor's office or shopping?: No    Assessment & Plan  1. Annual physical exam  - CBC with Differential/Platelet - COMPLETE METABOLIC PANEL WITH GFR - Lipid panel  2. Screening for diabetes mellitus  - COMPLETE METABOLIC PANEL WITH GFR  3. Screening for deficiency anemia  - CBC with Differential/Platelet  4. Screening for cholesterol level  - Lipid panel  5. Neutropenia, unspecified type (HCC) Getting labs - CBC with Differential/Platelet    7. Right inguinal hernia Referral placed to surgery - Ambulatory referral to General Surgery  8. Need for influenza vaccination  - Flu Vaccine Trivalent High Dose (Fluad)   -Prostate cancer screening and PSA options (with potential risks and benefits of testing vs not testing) were discussed along with recent recs/guidelines. -USPSTF grade A and B recommendations reviewed with patient; age-appropriate recommendations, preventive care, screening tests, etc discussed and encouraged; healthy living encouraged; see AVS for patient education given to patient -Discussed importance of 150 minutes of physical activity weekly, eat two servings of fish weekly, eat one serving of tree nuts ( cashews, pistachios, pecans, almonds.Marland Kitchen) every other day, eat 6 servings of fruit/vegetables daily and drink plenty of water and avoid sweet  beverages.  -Reviewed Health Maintenance: yes

## 2023-10-10 ENCOUNTER — Other Ambulatory Visit: Payer: Self-pay

## 2023-10-10 ENCOUNTER — Encounter: Payer: Self-pay | Admitting: Nurse Practitioner

## 2023-10-10 ENCOUNTER — Ambulatory Visit (INDEPENDENT_AMBULATORY_CARE_PROVIDER_SITE_OTHER): Payer: 59 | Admitting: Nurse Practitioner

## 2023-10-10 VITALS — BP 120/70 | HR 96 | Temp 97.7°F | Resp 16 | Ht 68.5 in | Wt 133.5 lb

## 2023-10-10 DIAGNOSIS — K4090 Unilateral inguinal hernia, without obstruction or gangrene, not specified as recurrent: Secondary | ICD-10-CM

## 2023-10-10 DIAGNOSIS — Z23 Encounter for immunization: Secondary | ICD-10-CM

## 2023-10-10 DIAGNOSIS — E785 Hyperlipidemia, unspecified: Secondary | ICD-10-CM | POA: Diagnosis not present

## 2023-10-10 DIAGNOSIS — Z130 Encounter for screening for diseases of the blood and blood-forming organs and certain disorders involving the immune mechanism: Secondary | ICD-10-CM

## 2023-10-10 DIAGNOSIS — Z13220 Encounter for screening for lipoid disorders: Secondary | ICD-10-CM

## 2023-10-10 DIAGNOSIS — Z131 Encounter for screening for diabetes mellitus: Secondary | ICD-10-CM

## 2023-10-10 DIAGNOSIS — Z0000 Encounter for general adult medical examination without abnormal findings: Secondary | ICD-10-CM | POA: Diagnosis not present

## 2023-10-10 DIAGNOSIS — D709 Neutropenia, unspecified: Secondary | ICD-10-CM | POA: Diagnosis not present

## 2023-10-11 LAB — CBC WITH DIFFERENTIAL/PLATELET
Absolute Lymphocytes: 1690 {cells}/uL (ref 850–3900)
Absolute Monocytes: 464 {cells}/uL (ref 200–950)
Basophils Absolute: 60 {cells}/uL (ref 0–200)
Basophils Relative: 1.4 %
Eosinophils Absolute: 90 {cells}/uL (ref 15–500)
Eosinophils Relative: 2.1 %
HCT: 41.5 % (ref 38.5–50.0)
Hemoglobin: 12.7 g/dL — ABNORMAL LOW (ref 13.2–17.1)
MCH: 22.2 pg — ABNORMAL LOW (ref 27.0–33.0)
MCHC: 30.6 g/dL — ABNORMAL LOW (ref 32.0–36.0)
MCV: 72.7 fL — ABNORMAL LOW (ref 80.0–100.0)
MPV: 10 fL (ref 7.5–12.5)
Monocytes Relative: 10.8 %
Neutro Abs: 1995 {cells}/uL (ref 1500–7800)
Neutrophils Relative %: 46.4 %
Platelets: 373 10*3/uL (ref 140–400)
RBC: 5.71 10*6/uL (ref 4.20–5.80)
RDW: 17.5 % — ABNORMAL HIGH (ref 11.0–15.0)
Total Lymphocyte: 39.3 %
WBC: 4.3 10*3/uL (ref 3.8–10.8)

## 2023-10-11 LAB — LIPID PANEL
Cholesterol: 141 mg/dL (ref <200)
HDL: 57 mg/dL (ref >=40)
LDL Cholesterol (Calc): 64 mg/dL
Non-HDL Cholesterol (Calc): 84 mg/dL (ref <130)
Total CHOL/HDL Ratio: 2.5 (calc) (ref <5.0)
Triglycerides: 112 mg/dL (ref <150)

## 2023-10-11 LAB — COMPLETE METABOLIC PANEL WITH GFR
AG Ratio: 1.6 (calc) (ref 1.0–2.5)
ALT: 8 U/L — ABNORMAL LOW (ref 9–46)
AST: 12 U/L (ref 10–35)
Albumin: 4.2 g/dL (ref 3.6–5.1)
Alkaline phosphatase (APISO): 84 U/L (ref 35–144)
BUN: 11 mg/dL (ref 7–25)
CO2: 31 mmol/L (ref 20–32)
Calcium: 9.5 mg/dL (ref 8.6–10.3)
Chloride: 103 mmol/L (ref 98–110)
Creat: 0.88 mg/dL (ref 0.70–1.28)
Globulin: 2.7 g/dL (ref 1.9–3.7)
Glucose, Bld: 91 mg/dL (ref 65–99)
Potassium: 5.4 mmol/L — ABNORMAL HIGH (ref 3.5–5.3)
Sodium: 141 mmol/L (ref 135–146)
Total Bilirubin: 0.4 mg/dL (ref 0.2–1.2)
Total Protein: 6.9 g/dL (ref 6.1–8.1)
eGFR: 91 mL/min/{1.73_m2} (ref >=60)

## 2023-10-15 NOTE — Progress Notes (Unsigned)
Patient ID: Jerry Pena, male   DOB: 08/07/1951, 72 y.o.   MRN: [id number]  Chief Complaint: Right inguinal hernia  History of Present Illness RHYEN Pena is a 72 y.o. male with right groin bulge, progressive over 1 year.  Has pain at times, but typically not provoked by coughing sneezing or heavy lifting.  He can reduce it, having it spontaneously recur with prolonged standing, or walking.  History of prostatectomy and prostatic radiation.  Denies any history of nausea, vomiting or abdominal pain.  Past Medical History Past Medical History:  Diagnosis Date   Beta thalassemia trait 09/27/2018   Cancer (HCC) 10/2017   Prostate   DDD (degenerative disc disease), lumbar 06/13/2017   Lumbar imaging June 2018   Elevated PSA 09/12/2017   Refer to urologist, Sept 2018   Facet hypertrophy of lumbar region 06/13/2017   Lumbar imaging June 2018   GERD (gastroesophageal reflux disease)    occ no meds   Hyperlipidemia       Past Surgical History:  Procedure Laterality Date   CATARACT EXTRACTION W/ INTRAOCULAR LENS IMPLANT Left 3/ 28/2014   eyes   CATARACT EXTRACTION W/ INTRAOCULAR LENS IMPLANT Right 04/23/2013   COLONOSCOPY WITH PROPOFOL N/A 07/29/2019   Procedure: COLONOSCOPY WITH PROPOFOL;  Surgeon: Jerry Reil, MD;  Location: [facility];  Service: Gastroenterology;  Laterality: N/A;   CYSTOSCOPY W/ RETROGRADES Bilateral 02/21/2021   Procedure: CYSTOSCOPY WITH RETROGRADE PYELOGRAM;  Surgeon: Jerry Scotland, MD;  Location: [facility];  Service: Urology;  Laterality: Bilateral;   CYSTOSCOPY WITH BIOPSY N/A 02/21/2021   Procedure: CYSTOSCOPY WITH BIOPSY and fulgeration;  Surgeon: Jerry Scotland, MD;  Location: [facility];  Service: Urology;  Laterality: N/A;   FLEXIBLE SIGMOIDOSCOPY N/A 11/14/2019   Procedure: FLEXIBLE SIGMOIDOSCOPY;  Surgeon: Jerry Reil, MD;  Location: [facility];  Service: Gastroenterology;  Laterality: N/A;   PELVIC LYMPH NODE DISSECTION N/A 12/10/2017    Procedure: PELVIC LYMPH NODE DISSECTION;  Surgeon: Jerry Scotland, MD;  Location: [facility];  Service: Urology;  Laterality: N/A;   PROSTATE SURGERY     ROBOT ASSISTED LAPAROSCOPIC RADICAL PROSTATECTOMY N/A 12/10/2017   Procedure: ROBOTIC ASSISTED LAPAROSCOPIC RADICAL PROSTATECTOMY;  Surgeon: Jerry Scotland, MD;  Location: [facility];  Service: Urology;  Laterality: N/A;    Allergies  Allergen Reactions   Aspirin Other (See Comments)    Upset stomach, indigestion and heart burn   Penicillins Hives and Other (See Comments)    Has patient had a PCN reaction causing immediate rash, facial/tongue/throat swelling, SOB or lightheadedness with hypotension: Yes Has patient had a PCN reaction causing severe rash involving mucus membranes or skin necrosis: No Has patient had a PCN reaction that required hospitalization: Yes Has patient had a PCN reaction occurring within the last 10 years: No If all of the above answers are "NO", then may proceed with Cephalosporin use.     Current Outpatient Medications  Medication Sig Dispense Refill   acetaminophen (TYLENOL) 500 MG tablet Take 1,000 mg by mouth every 6 (six) hours as needed for moderate pain or headache.      calcium-vitamin D (OSCAL WITH D) 500-200 MG-UNIT tablet Take 1 tablet by mouth daily.     docusate sodium (COLACE) 100 MG capsule Take 100 mg by mouth daily.     folic acid (FOLVITE) 400 MCG tablet Take 1 tablet (400 mcg total) by mouth daily. 90 tablet 4   ibuprofen (ADVIL,MOTRIN) 200 MG tablet Take 400 mg by mouth every 6 (six) hours  as needed for headache or moderate pain.     Melatonin 5 MG TABS Take 5 mg by mouth daily as needed.     Multiple Vitamin (MULTIVITAMIN) tablet Take 1 tablet by mouth daily.     polyethylene glycol (MIRALAX / GLYCOLAX) 17 g packet Take 17 g by mouth daily.     vitamin B-12 (CYANOCOBALAMIN) 100 MCG tablet Take 100 mcg by mouth daily.     vitamin C (ASCORBIC ACID) 500 MG tablet Take 500 mg by mouth daily.      No current facility-administered medications for this visit.    Family History Family History  Problem Relation Age of Onset   Stroke Mother    Stroke Father    Cancer Sister        breast, lung   Pulmonary Hypertension Sister    Cancer Son        prostate   Prostate cancer Son    Hypertension Sister    Bladder Cancer Neg Hx    Kidney cancer Neg Hx       Social History Social History   Tobacco Use   Smoking status: Some Days    Types: Cigars   Smokeless tobacco: Never   Tobacco comments:    occasional cigar  Vaping Use   Vaping status: Never Used  Substance Use Topics   Alcohol use: Yes    Alcohol/week: 0.0 standard drinks of alcohol    Comment: beer rarely   Drug use: No    Comment: used marijuana more than 10 years ago        Review of Systems  Constitutional: Negative.   HENT: Negative.    Eyes:  Positive for blurred vision.  Cardiovascular: Negative.   Gastrointestinal:  Positive for constipation.  Genitourinary: Negative.   Skin: Negative.   Neurological: Negative.   Psychiatric/Behavioral: Negative.       Physical Exam Blood pressure 110/71, pulse 76, temperature 98 F (36.7 C), temperature source Oral, height 5\' 8"  (1.727 m), weight 129 lb 12.8 oz (58.9 kg), SpO2 95%. Last Weight  Most recent update: 10/16/2023  9:46 AM    Weight  58.9 kg (129 lb 12.8 oz)             CONSTITUTIONAL: Well developed, and nourished, appropriately responsive and aware without distress.   EYES: Sclera non-icteric.   EARS, NOSE, MOUTH AND THROAT:  The oropharynx is clear. Oral mucosa is pink and moist.   Hearing is intact to voice.  NECK: Trachea is midline, and there is no jugular venous distension.  LYMPH NODES:  Lymph nodes in the neck are not appreciated. RESPIRATORY:  Lungs are clear, and breath sounds are equal bilaterally.  Normal respiratory effort without pathologic use of accessory muscles. CARDIOVASCULAR: Heart is regular in rate and rhythm.    Well perfused.  GI: The abdomen is  soft, nontender, and nondistended. There were no palpable masses.  I did not appreciate hepatosplenomegaly. GU: Readily appreciated right groin bulge consistent with right inguinal hernia, cannot appreciate hernia on the left side.  Testes descended bilaterally. MUSCULOSKELETAL:  Symmetrical muscle tone appreciated in all four extremities.    SKIN: Skin turgor is normal. No pathologic skin lesions appreciated.  NEUROLOGIC:  Motor and sensation appear grossly normal.  Cranial nerves are grossly without defect. PSYCH:  Alert and oriented to person, place and time. Affect is appropriate for situation.  Data Reviewed I have personally reviewed what is currently available of the patient's imaging, recent labs and medical  records.   Labs:     Latest Ref Rng & Units 10/10/2023    2:40 PM 10/04/2022    3:47 PM 02/17/2021    9:02 AM  CBC  WBC 3.8 - 10.8 Thousand/uL 4.3  4.6  3.7   Hemoglobin 13.2 - 17.1 g/dL 13.0  86.5  78.4   Hematocrit 38.5 - 50.0 % 41.5  40.4  34.0   Platelets 140 - 400 Thousand/uL 373  332  360       Latest Ref Rng & Units 10/10/2023    2:40 PM 10/04/2022    3:47 PM 02/17/2021    9:02 AM  CMP  Glucose 65 - 99 mg/dL 91  93  96   BUN 7 - 25 mg/dL 11  19  14    Creatinine 0.70 - 1.28 mg/dL 6.96  2.95  2.84   Sodium 135 - 146 mmol/L 141  141  137   Potassium 3.5 - 5.3 mmol/L 5.4  4.3  4.0   Chloride 98 - 110 mmol/L 103  105  104   CO2 20 - 32 mmol/L 31  29  27    Calcium 8.6 - 10.3 mg/dL 9.5  9.8  9.2   Total Protein 6.1 - 8.1 g/dL 6.9  7.1    Total Bilirubin 0.2 - 1.2 mg/dL 0.4  0.8    AST 10 - 35 U/L 12  19    ALT 9 - 46 U/L 8  9       Imaging: Radiological images reviewed:   Within last 24 hrs: No results found.  Assessment    Right inguinal hernia Patient Active Problem List   Diagnosis Date Noted   Impingement syndrome of shoulder region 07/07/2021   Osteoarthritis of right shoulder 07/07/2021   Pain in joint, hand  07/07/2021   Lipoma of shoulder 07/07/2021   Right inguinal hernia 10/21/2020   Chronic constipation 10/14/2019   Rectal bleeding 10/14/2019   Radiation proctitis    Migraine 01/23/2019   Cataract 01/23/2019   Beta thalassemia trait 09/27/2018   Neutropenia (HCC) 09/23/2018   History of hormone therapy 06/07/2018   Prostate cancer (HCC) 12/10/2017   Facet hypertrophy of lumbar region 06/13/2017   DDD (degenerative disc disease), lumbar 06/13/2017   Hyperlipidemia 09/09/2015   Sciatica 09/09/2015   Arthritis, lumbar spine 09/09/2015   Tobacco abuse 09/09/2015   History of colonic polyps 09/09/2015    Plan    Robotic repair of right inguinal hernia, possible bilateral. I discussed possibility of incarceration, strangulation, enlargement in size over time, and the need for emergency surgery in the face of these.  Also reviewed the techniques of reduction should incarceration occur, and when unsuccessful to present to the ED.  Also discussed that surgery risks include recurrence which can be up to 30% in the case of complex hernias, use of prosthetic materials (mesh) and the increased risk of infection and the possible need for re-operation and removal of mesh, possibility of post-op SBO or ileus, and the risks of general anesthetic including heart attack, stroke, sudden death or some reaction to anesthetic medications. The patient, and those present, appear to understand the risks, any and all questions were answered to the patient's satisfaction.  No guarantees were ever expressed or implied.    Face-to-face time spent with the patient and accompanying care providers(if present) was 30 minutes, with more than 50% of the time spent counseling, educating, and coordinating care of the patient.    These notes generated with voice  recognition software. I apologize for typographical errors.  [name] M.D., FACS 10/16/2023, 12:34 PM

## 2023-10-16 ENCOUNTER — Telehealth: Payer: Self-pay | Admitting: Surgery

## 2023-10-16 ENCOUNTER — Ambulatory Visit: Payer: Self-pay | Admitting: Surgery

## 2023-10-16 ENCOUNTER — Ambulatory Visit (INDEPENDENT_AMBULATORY_CARE_PROVIDER_SITE_OTHER): Payer: 59 | Admitting: Surgery

## 2023-10-16 ENCOUNTER — Encounter: Payer: Self-pay | Admitting: Surgery

## 2023-10-16 VITALS — BP 110/71 | HR 76 | Temp 98.0°F | Ht 68.0 in | Wt 129.8 lb

## 2023-10-16 DIAGNOSIS — K4090 Unilateral inguinal hernia, without obstruction or gangrene, not specified as recurrent: Secondary | ICD-10-CM

## 2023-10-16 NOTE — Telephone Encounter (Signed)
Left message for patient to call, please inform him of the following regarding scheduled surgery with Dr. Claudine Mouton.   Pre-Admission date/time, and Surgery date at [facility].  Surgery Date: 11/21/23 Preadmission Testing Date: 11/13/23 (phone 8a-1p)  Also patient will need to call at [phone], between 1-3:00pm the day before surgery, to find out what time to arrive for surgery.

## 2023-10-16 NOTE — Patient Instructions (Signed)
You have chose to have your hernia repaired. This will be done by Dr. Rodenberg at [facility].  Please see your (blue) Pre-care information that you have been given today. Our surgery scheduler will call you to verify surgery date and to go over information.   You will need to arrange to be out of work for approximately 1-2 weeks and then you may return with a lifting restriction for 4 more weeks. If you have FMLA or Disability paperwork that needs to be filled out, please have your company fax your paperwork to [phone] or you may drop this by either office. This paperwork will be filled out within 3 days after your surgery has been completed.  You may have a bruise in your groin and also swelling and brusing in your testicle area. You may use ice 4-5 times daily for 15-20 minutes each time. Make sure that you place a barrier between you and the ice pack. To decrease the swelling, you may roll up a bath towel and place it vertically in between your thighs with your testicles resting on the towel. You will want to keep this area elevated as much as possible for several days following surgery.     Inguinal Hernia, Adult Muscles help keep everything in the body in its proper place. But if a weak spot in the muscles develops, something can poke through. That is called a hernia. When this happens in the lower part of the belly (abdomen), it is called an inguinal hernia. (It takes its name from a part of the body in this region called the inguinal canal.) A weak spot in the wall of muscles lets some fat or part of the small intestine bulge through. An inguinal hernia can develop at any age. Men get them more often than women. CAUSES  In adults, an inguinal hernia develops over time. It can be triggered by: Suddenly straining the muscles of the lower abdomen. Lifting heavy objects. Straining to have a bowel movement. Difficult bowel movements (constipation) can lead to this. Constant coughing. This may  be caused by smoking or lung disease. Being overweight. Being pregnant. Working at a job that requires long periods of standing or heavy lifting. Having had an inguinal hernia before. One type can be an emergency situation. It is called a strangulated inguinal hernia. It develops if part of the small intestine slips through the weak spot and cannot get back into the abdomen. The blood supply can be cut off. If that happens, part of the intestine may die. This situation requires emergency surgery. SYMPTOMS  Often, a small inguinal hernia has no symptoms. It is found when a healthcare provider does a physical exam. Larger hernias usually have symptoms.  In adults, symptoms may include: A lump in the groin. This is easier to see when the person is standing. It might disappear when lying down. In men, a lump in the scrotum. Pain or burning in the groin. This occurs especially when lifting, straining or coughing. A dull ache or feeling of pressure in the groin. Signs of a strangulated hernia can include: A bulge in the groin that becomes very painful and tender to the touch. A bulge that turns red or purple. Fever, nausea and vomiting. Inability to have a bowel movement or to pass gas. DIAGNOSIS  To decide if you have an inguinal hernia, a healthcare provider will probably do a physical examination. This will include asking questions about any symptoms you have noticed. The healthcare provider might   feel the groin area and ask you to cough. If an inguinal hernia is felt, the healthcare provider may try to slide it back into the abdomen. Usually no other tests are needed. TREATMENT  Treatments can vary. The size of the hernia makes a difference. Options include: Watchful waiting. This is often suggested if the hernia is small and you have had no symptoms. No medical procedure will be done unless symptoms develop. You will need to watch closely for symptoms. If any occur, contact your healthcare  provider right away. Surgery. This is used if the hernia is larger or you have symptoms. Open surgery. This is usually an outpatient procedure (you will not stay overnight in a hospital). An cut (incision) is made through the skin in the groin. The hernia is put back inside the abdomen. The weak area in the muscles is then repaired by herniorrhaphy or hernioplasty. Herniorrhaphy: in this type of surgery, the weak muscles are sewn back together. Hernioplasty: a patch or mesh is used to close the weak area in the abdominal wall. Laparoscopy. In this procedure, a surgeon makes small incisions. A thin tube with a tiny video camera (called a laparoscope) is put into the abdomen. The surgeon repairs the hernia with mesh by looking with the video camera and using two long instruments. HOME CARE INSTRUCTIONS  After surgery to repair an inguinal hernia: You will need to take pain medicine prescribed by your healthcare provider. Follow all directions carefully. You will need to take care of the wound from the incision. Your activity will be restricted for awhile. This will probably include no heavy lifting for several weeks. You also should not do anything too active for a few weeks. When you can return to work will depend on the type of job that you have. During "watchful waiting" periods, you should: Maintain a healthy weight. Eat a diet high in fiber (fruits, vegetables and whole grains). Drink plenty of fluids to avoid constipation. This means drinking enough water and other liquids to keep your urine clear or pale yellow. Do not lift heavy objects. Do not stand for long periods of time. Quit smoking. This should keep you from developing a frequent cough. SEEK MEDICAL CARE IF:  A bulge develops in your groin area. You feel pain, a burning sensation or pressure in the groin. This might be worse if you are lifting or straining. You develop a fever of more than 100.5 F (38.1 C). SEEK IMMEDIATE MEDICAL  CARE IF:  Pain in the groin increases suddenly. A bulge in the groin gets bigger suddenly and does not go down. For men, there is sudden pain in the scrotum. Or, the size of the scrotum increases. A bulge in the groin area becomes red or purple and is painful to touch. You have nausea or vomiting that does not go away. You feel your heart beating much faster than normal. You cannot have a bowel movement or pass gas. You develop a fever of more than 102.0 F (38.9 C).   This information is not intended to replace advice given to you by your health care provider. Make sure you discuss any questions you have with your health care provider.   Document Released: 04/29/2009 Document Revised: 03/04/2012 Document Reviewed: 06/14/2015 Elsevier Interactive Patient Education 2016 [employer] 

## 2023-10-16 NOTE — Telephone Encounter (Signed)
error 

## 2023-10-17 NOTE — Telephone Encounter (Signed)
Called patient back, he is now informed of all dates regarding surgery.

## 2023-10-26 DIAGNOSIS — K4090 Unilateral inguinal hernia, without obstruction or gangrene, not specified as recurrent: Secondary | ICD-10-CM

## 2023-10-26 HISTORY — DX: Unilateral inguinal hernia, without obstruction or gangrene, not specified as recurrent: K40.90

## 2023-10-30 ENCOUNTER — Other Ambulatory Visit: Payer: 59

## 2023-10-30 DIAGNOSIS — C61 Malignant neoplasm of prostate: Secondary | ICD-10-CM

## 2023-10-31 LAB — PSA: Prostate Specific Ag, Serum: <0.1 ng/mL (ref 0.0–4.0)

## 2023-11-13 ENCOUNTER — Encounter
Admission: RE | Admit: 2023-11-13 | Discharge: 2023-11-13 | Disposition: A | Discharge status: Home / Self Care | Payer: 59 | Source: Ambulatory Visit | Attending: Surgery | Admitting: Surgery

## 2023-11-13 ENCOUNTER — Other Ambulatory Visit: Payer: Self-pay

## 2023-11-13 VITALS — Ht 68.0 in | Wt 136.0 lb

## 2023-11-13 DIAGNOSIS — E7849 Other hyperlipidemia: Secondary | ICD-10-CM

## 2023-11-13 DIAGNOSIS — Z01810 Encounter for preprocedural cardiovascular examination: Secondary | ICD-10-CM

## 2023-11-13 HISTORY — DX: Radiation proctitis: K62.7

## 2023-11-13 HISTORY — DX: Other constipation: K59.09

## 2023-11-13 HISTORY — DX: Unspecified cataract: H26.9

## 2023-11-13 HISTORY — DX: Polyp of colon: K63.5

## 2023-11-13 NOTE — Patient Instructions (Addendum)
Your procedure is scheduled on: Wednesday, November 27 Report to the Registration Desk on the 1st floor of the [facility] To find out your arrival time, please call [phone] between 1PM - 3PM on: Tuesday,  November 26 If your arrival time is 6:00 am, do not arrive before that time as the [facility] entrance doors do not open until 6:00 am.  REMEMBER: Instructions that are not followed completely may result in serious medical risk, up to and including death; or upon the discretion of your surgeon and anesthesiologist your surgery may need to be rescheduled.  Do not or drink food after midnight the night before surgery.  No gum chewing or hard candies.  One week prior to surgery: starting November 20 Stop Anti-inflammatories (NSAIDS) such as Advil, Aleve, Ibuprofen, Motrin, Naproxen, Naprosyn and Aspirin based products such as Excedrin, Goody's Powder, BC Powder. Stop ANY OVER THE COUNTER supplements until after surgery. Stop calcium, folic acid, melatonin, multiple vitamins, vitamin B, C.   You may however, continue to take Tylenol if needed for pain up until the day of surgery.  DO NOT TAKE ANY MEDICATIONS WITH SIPS ON THE MORNING OF SURGERY.  No Alcohol for 24 hours before or after surgery.  No Smoking including e-cigarettes for 24 hours before surgery.  No chewable tobacco products for at least 6 hours before surgery.  No nicotine patches on the day of surgery.  Do not use any "recreational" drugs for at least a week (preferably 2 weeks) before your surgery.  Please be advised that the combination of cocaine and anesthesia may have negative outcomes, up to and including death. If you test positive for cocaine, your surgery will be cancelled.  On the morning of surgery brush your teeth with toothpaste and water, you may rinse your mouth with mouthwash if you wish. Do not swallow any toothpaste or mouthwash.  Use CHG Soap as directed on instruction sheet.  Do not wear  jewelry, make-up, hairpins, clips or nail polish.  For welded (permanent) jewelry: bracelets, anklets, waist bands, etc.  Please have this removed prior to surgery.  If it is not removed, there is a chance that hospital personnel will need to cut it off on the day of surgery.  Do not wear lotions, powders, or perfumes.   Do not shave body hair from the neck down 48 hours before surgery.  Contact lenses, hearing aids and dentures may not be worn into surgery.  Do not bring valuables to the hospital. [facility] is not responsible for any missing/lost belongings or valuables.   Notify your doctor if there is any change in your medical condition (cold, fever, infection).  Wear comfortable clothing (specific to your surgery type) to the hospital.  After surgery, you can help prevent lung complications by doing breathing exercises.  Take deep breaths and cough every 1-2 hours. Your doctor may order a device called an Incentive Spirometer to help you take deep breaths. When coughing or sneezing, hold a pillow firmly against your incision with both hands. This is called "splinting." Doing this helps protect your incision. It also decreases belly discomfort.  If you are being discharged the day of surgery, you will not be allowed to drive home. You will need a responsible individual to drive you home and stay with you for 24 hours after surgery.   If you are taking public transportation, you will need to have a responsible individual with you.  Please call the Pre-admissions Testing Dept. at [phone]  if you have any questions about these instructions.  Surgery Visitation Policy:  Patients having surgery or a procedure may have two visitors.  Children under the age of 65 must have an adult with them who is not the patient.     Preparing for Surgery with CHLORHEXIDINE GLUCONATE (CHG) Soap  Chlorhexidine Gluconate (CHG) Soap  o An antiseptic cleaner that kills germs and bonds with  the skin to continue killing germs even after washing  o Used for showering the night before surgery and morning of surgery  Before surgery, you can play an important role by reducing the number of germs on your skin.  CHG (Chlorhexidine gluconate) soap is an antiseptic cleanser which kills germs and bonds with the skin to continue killing germs even after washing.  Please do not use if you have an allergy to CHG or antibacterial soaps. If your skin becomes reddened/irritated stop using the CHG.  1. Shower the NIGHT BEFORE SURGERY and the MORNING OF SURGERY with CHG soap.  2. If you choose to wash your hair, wash your hair first as usual with your normal shampoo.  3. After shampooing, rinse your hair and body thoroughly to remove the shampoo.  4. Use CHG as you would any other liquid soap. You can apply CHG directly to the skin and wash gently with a scrungie or a clean washcloth.  5. Apply the CHG soap to your body only from the neck down. Do not use on open wounds or open sores. Avoid contact with your eyes, ears, mouth, and genitals (private parts). Wash face and genitals (private parts) with your normal soap.  6. Wash thoroughly, paying special attention to the area where your surgery will be performed.  7. Thoroughly rinse your body with warm water.  8. Do not shower/wash with your normal soap after using and rinsing off the CHG soap.  9. Pat yourself dry with a clean towel.  10. Wear clean pajamas to bed the night before surgery.  12. Place clean sheets on your bed the night of your first shower and do not sleep with pets.  13. Shower again with the CHG soap on the day of surgery prior to arriving at the hospital.  14. Do not apply any deodorants/lotions/powders.  15. Please wear clean clothes to the hospital.

## 2023-11-16 ENCOUNTER — Telehealth: Payer: Self-pay | Admitting: Surgery

## 2023-11-16 ENCOUNTER — Encounter
Admission: RE | Admit: 2023-11-16 | Discharge: 2023-11-16 | Disposition: A | Discharge status: Home / Self Care | Payer: 59 | Source: Ambulatory Visit | Attending: Surgery | Admitting: Surgery

## 2023-11-16 DIAGNOSIS — Z01810 Encounter for preprocedural cardiovascular examination: Secondary | ICD-10-CM | POA: Insufficient documentation

## 2023-11-16 DIAGNOSIS — E7849 Other hyperlipidemia: Secondary | ICD-10-CM | POA: Insufficient documentation

## 2023-11-16 NOTE — Telephone Encounter (Signed)
Patient having robotic right inguinal hernia repair, possible bilateral done on 11/21/23.  Says he is having a lot of pain and wants to know if can call in Percocet for him.  He uses [facility] in [place]. Please call him. Thank you.

## 2023-11-21 ENCOUNTER — Other Ambulatory Visit: Payer: Self-pay

## 2023-11-21 ENCOUNTER — Ambulatory Visit
Admission: RE | Admit: 2023-11-21 | Discharge: 2023-11-21 | Disposition: A | Discharge status: Home / Self Care | Payer: 59 | Attending: Surgery | Admitting: Surgery

## 2023-11-21 ENCOUNTER — Encounter
Admission: RE | Disposition: A | Discharge status: Home / Self Care | Payer: Self-pay | Source: Home / Self Care | Attending: Surgery

## 2023-11-21 ENCOUNTER — Ambulatory Visit: Payer: 59 | Admitting: Certified Registered"

## 2023-11-21 ENCOUNTER — Encounter: Payer: Self-pay | Admitting: Surgery

## 2023-11-21 DIAGNOSIS — Z961 Presence of intraocular lens: Secondary | ICD-10-CM | POA: Diagnosis not present

## 2023-11-21 DIAGNOSIS — Z8546 Personal history of malignant neoplasm of prostate: Secondary | ICD-10-CM | POA: Insufficient documentation

## 2023-11-21 DIAGNOSIS — K9189 Other postprocedural complications and disorders of digestive system: Secondary | ICD-10-CM | POA: Diagnosis not present

## 2023-11-21 DIAGNOSIS — Z79899 Other long term (current) drug therapy: Secondary | ICD-10-CM | POA: Diagnosis not present

## 2023-11-21 DIAGNOSIS — Z743 Need for continuous supervision: Secondary | ICD-10-CM | POA: Diagnosis not present

## 2023-11-21 DIAGNOSIS — K4090 Unilateral inguinal hernia, without obstruction or gangrene, not specified as recurrent: Secondary | ICD-10-CM | POA: Insufficient documentation

## 2023-11-21 DIAGNOSIS — E785 Hyperlipidemia, unspecified: Secondary | ICD-10-CM | POA: Diagnosis not present

## 2023-11-21 DIAGNOSIS — Z923 Personal history of irradiation: Secondary | ICD-10-CM | POA: Insufficient documentation

## 2023-11-21 DIAGNOSIS — Z880 Allergy status to penicillin: Secondary | ICD-10-CM | POA: Diagnosis not present

## 2023-11-21 DIAGNOSIS — Z9079 Acquired absence of other genital organ(s): Secondary | ICD-10-CM | POA: Insufficient documentation

## 2023-11-21 DIAGNOSIS — R140 Abdominal distension (gaseous): Secondary | ICD-10-CM | POA: Diagnosis not present

## 2023-11-21 DIAGNOSIS — Z8042 Family history of malignant neoplasm of prostate: Secondary | ICD-10-CM | POA: Diagnosis not present

## 2023-11-21 DIAGNOSIS — K8500 Idiopathic acute pancreatitis without necrosis or infection: Secondary | ICD-10-CM | POA: Diagnosis not present

## 2023-11-21 DIAGNOSIS — F17290 Nicotine dependence, other tobacco product, uncomplicated: Secondary | ICD-10-CM | POA: Insufficient documentation

## 2023-11-21 DIAGNOSIS — R109 Unspecified abdominal pain: Secondary | ICD-10-CM | POA: Diagnosis not present

## 2023-11-21 DIAGNOSIS — Z9841 Cataract extraction status, right eye: Secondary | ICD-10-CM | POA: Diagnosis not present

## 2023-11-21 DIAGNOSIS — K8689 Other specified diseases of pancreas: Secondary | ICD-10-CM | POA: Diagnosis not present

## 2023-11-21 DIAGNOSIS — K219 Gastro-esophageal reflux disease without esophagitis: Secondary | ICD-10-CM | POA: Diagnosis not present

## 2023-11-21 DIAGNOSIS — I10 Essential (primary) hypertension: Secondary | ICD-10-CM | POA: Diagnosis not present

## 2023-11-21 DIAGNOSIS — Z9842 Cataract extraction status, left eye: Secondary | ICD-10-CM | POA: Diagnosis not present

## 2023-11-21 DIAGNOSIS — K567 Ileus, unspecified: Secondary | ICD-10-CM | POA: Diagnosis not present

## 2023-11-21 DIAGNOSIS — D72829 Elevated white blood cell count, unspecified: Secondary | ICD-10-CM | POA: Diagnosis not present

## 2023-11-21 DIAGNOSIS — R112 Nausea with vomiting, unspecified: Secondary | ICD-10-CM | POA: Diagnosis not present

## 2023-11-21 DIAGNOSIS — K627 Radiation proctitis: Secondary | ICD-10-CM | POA: Diagnosis not present

## 2023-11-21 DIAGNOSIS — Z8249 Family history of ischemic heart disease and other diseases of the circulatory system: Secondary | ICD-10-CM | POA: Diagnosis not present

## 2023-11-21 DIAGNOSIS — Z0189 Encounter for other specified special examinations: Secondary | ICD-10-CM | POA: Diagnosis not present

## 2023-11-21 DIAGNOSIS — Z886 Allergy status to analgesic agent status: Secondary | ICD-10-CM | POA: Diagnosis not present

## 2023-11-21 DIAGNOSIS — Y838 Other surgical procedures as the cause of abnormal reaction of the patient, or of later complication, without mention of misadventure at the time of the procedure: Secondary | ICD-10-CM | POA: Diagnosis present

## 2023-11-21 DIAGNOSIS — Z823 Family history of stroke: Secondary | ICD-10-CM | POA: Diagnosis not present

## 2023-11-21 DIAGNOSIS — R188 Other ascites: Secondary | ICD-10-CM | POA: Diagnosis not present

## 2023-11-21 HISTORY — PX: HERNIA REPAIR: SHX51

## 2023-11-21 SURGERY — HERNIORRHAPHY, INGUINAL, ROBOT-ASSISTED, LAPAROSCOPIC
Anesthesia: General | Laterality: Right

## 2023-11-21 MED ORDER — 0.9 % SODIUM CHLORIDE (POUR BTL) OPTIME
TOPICAL | Status: DC | PRN
  Administered 2023-11-21: 500 mL

## 2023-11-21 MED ORDER — HYDROCODONE-ACETAMINOPHEN 5-325 MG PO TABS: 1.0000 | ORAL_TABLET | Freq: Four times a day (QID) | ORAL | 0 refills | Status: DC | PRN

## 2023-11-21 MED ORDER — CHLORHEXIDINE GLUCONATE 0.12 % MT SOLN
OROMUCOSAL | Status: AC
  Filled 2023-11-21: qty 15

## 2023-11-21 MED ORDER — PHENYLEPHRINE 80 MCG/ML (10ML) SYRINGE FOR IV PUSH (FOR BLOOD PRESSURE SUPPORT)
PREFILLED_SYRINGE | INTRAVENOUS | Status: DC | PRN
  Administered 2023-11-21 (×11): 160 ug via INTRAVENOUS

## 2023-11-21 MED ORDER — CHLORHEXIDINE GLUCONATE 0.12 % MT SOLN
15.0000 mL | Freq: Once | OROMUCOSAL | Status: AC
Start: 2023-11-21 — End: 2023-11-21
  Administered 2023-11-21: 15 mL via OROMUCOSAL

## 2023-11-21 MED ORDER — CHLORHEXIDINE GLUCONATE CLOTH 2 % EX PADS
6.0000 | MEDICATED_PAD | Freq: Once | CUTANEOUS | Status: AC
  Administered 2023-11-21: 6 via TOPICAL

## 2023-11-21 MED ORDER — ESMOLOL HCL 100 MG/10ML IV SOLN
INTRAVENOUS | Status: DC | PRN
  Administered 2023-11-21: 20 mg via INTRAVENOUS

## 2023-11-21 MED ORDER — MIDAZOLAM HCL 2 MG/2ML IJ SOLN
INTRAMUSCULAR | Status: AC
  Filled 2023-11-21: qty 2

## 2023-11-21 MED ORDER — BUPIVACAINE LIPOSOME 1.3 % IJ SUSP
INTRAMUSCULAR | Status: AC
  Filled 2023-11-21: qty 20

## 2023-11-21 MED ORDER — LACTATED RINGERS IV SOLN: INTRAVENOUS | Status: AC

## 2023-11-21 MED ORDER — BUPIVACAINE-EPINEPHRINE (PF) 0.25% -1:200000 IJ SOLN
INTRAMUSCULAR | Status: DC | PRN
  Administered 2023-11-21: 30 mL

## 2023-11-21 MED ORDER — FENTANYL CITRATE (PF) 100 MCG/2ML IJ SOLN
INTRAMUSCULAR | Status: AC
  Filled 2023-11-21: qty 2

## 2023-11-21 MED ORDER — ACETAMINOPHEN 10 MG/ML IV SOLN: 1000.0000 mg | Freq: Once | INTRAVENOUS | Status: DC | PRN

## 2023-11-21 MED ORDER — CELECOXIB 200 MG PO CAPS
ORAL_CAPSULE | ORAL | Status: AC
  Filled 2023-11-21: qty 1

## 2023-11-21 MED ORDER — CIPROFLOXACIN IN D5W 400 MG/200ML IV SOLN
400.0000 mg | INTRAVENOUS | Status: AC
Start: 2023-11-21 — End: 2023-11-21
  Administered 2023-11-21: 400 mg via INTRAVENOUS

## 2023-11-21 MED ORDER — BUPIVACAINE LIPOSOME 1.3 % IJ SUSP
INTRAMUSCULAR | Status: DC | PRN
  Administered 2023-11-21: 20 mL

## 2023-11-21 MED ORDER — BUPIVACAINE-EPINEPHRINE (PF) 0.25% -1:200000 IJ SOLN
INTRAMUSCULAR | Status: AC
  Filled 2023-11-21: qty 30

## 2023-11-21 MED ORDER — ACETAMINOPHEN 500 MG PO TABS
ORAL_TABLET | ORAL | Status: AC
  Filled 2023-11-21: qty 2

## 2023-11-21 MED ORDER — EPHEDRINE SULFATE-NACL 50-0.9 MG/10ML-% IV SOSY
PREFILLED_SYRINGE | INTRAVENOUS | Status: DC | PRN
  Administered 2023-11-21: 5 mg via INTRAVENOUS

## 2023-11-21 MED ORDER — FENTANYL CITRATE (PF) 100 MCG/2ML IJ SOLN
INTRAMUSCULAR | Status: DC | PRN
  Administered 2023-11-21 (×4): 25 ug via INTRAVENOUS

## 2023-11-21 MED ORDER — CIPROFLOXACIN IN D5W 400 MG/200ML IV SOLN
INTRAVENOUS | Status: AC
  Filled 2023-11-21: qty 200

## 2023-11-21 MED ORDER — BUPIVACAINE LIPOSOME 1.3 % IJ SUSP
20.0000 mL | Freq: Once | INTRAMUSCULAR | Status: DC
Start: 2023-11-21 — End: 2023-11-21

## 2023-11-21 MED ORDER — OXYCODONE HCL 5 MG/5ML PO SOLN: 5.0000 mg | Freq: Once | ORAL | Status: AC | PRN

## 2023-11-21 MED ORDER — OXYCODONE HCL 5 MG PO TABS
5.0000 mg | ORAL_TABLET | Freq: Once | ORAL | Status: AC | PRN
  Administered 2023-11-21: 5 mg via ORAL

## 2023-11-21 MED ORDER — CHLORHEXIDINE GLUCONATE CLOTH 2 % EX PADS: 6.0000 | MEDICATED_PAD | Freq: Once | CUTANEOUS | Status: DC

## 2023-11-21 MED ORDER — LIDOCAINE HCL (CARDIAC) PF 100 MG/5ML IV SOSY
PREFILLED_SYRINGE | INTRAVENOUS | Status: DC | PRN
  Administered 2023-11-21: 60 mg via INTRAVENOUS

## 2023-11-21 MED ORDER — ROCURONIUM BROMIDE 100 MG/10ML IV SOLN
INTRAVENOUS | Status: DC | PRN
  Administered 2023-11-21: 30 mg via INTRAVENOUS
  Administered 2023-11-21 (×2): 20 mg via INTRAVENOUS

## 2023-11-21 MED ORDER — OXYCODONE HCL 5 MG PO TABS
ORAL_TABLET | ORAL | Status: AC
  Filled 2023-11-21: qty 1

## 2023-11-21 MED ORDER — ACETAMINOPHEN 500 MG PO TABS
1000.0000 mg | ORAL_TABLET | ORAL | Status: AC
Start: 2023-11-21 — End: 2023-11-21
  Administered 2023-11-21: 1000 mg via ORAL

## 2023-11-21 MED ORDER — GABAPENTIN 300 MG PO CAPS
300.0000 mg | ORAL_CAPSULE | ORAL | Status: AC
  Administered 2023-11-21: 300 mg via ORAL

## 2023-11-21 MED ORDER — LACTATED RINGERS IV SOLN: INTRAVENOUS | Status: DC

## 2023-11-21 MED ORDER — ROCURONIUM BROMIDE 10 MG/ML (PF) SYRINGE
PREFILLED_SYRINGE | INTRAVENOUS | Status: AC
  Filled 2023-11-21: qty 10

## 2023-11-21 MED ORDER — ONDANSETRON HCL 4 MG/2ML IJ SOLN: 4.0000 mg | Freq: Once | INTRAMUSCULAR | Status: DC | PRN

## 2023-11-21 MED ORDER — SUGAMMADEX SODIUM 200 MG/2ML IV SOLN
INTRAVENOUS | Status: DC | PRN
  Administered 2023-11-21: 200 mg via INTRAVENOUS

## 2023-11-21 MED ORDER — PROPOFOL 10 MG/ML IV BOLUS
INTRAVENOUS | Status: DC | PRN
  Administered 2023-11-21: 40 mg via INTRAVENOUS
  Administered 2023-11-21: 100 mg via INTRAVENOUS

## 2023-11-21 MED ORDER — ORAL CARE MOUTH RINSE: 15.0000 mL | Freq: Once | OROMUCOSAL | Status: AC

## 2023-11-21 MED ORDER — PROPOFOL 10 MG/ML IV BOLUS
INTRAVENOUS | Status: AC
  Filled 2023-11-21: qty 40

## 2023-11-21 MED ORDER — IBUPROFEN 200 MG PO TABS: 400.0000 mg | ORAL_TABLET | Freq: Four times a day (QID) | ORAL | 0 refills | Status: AC | PRN

## 2023-11-21 MED ORDER — CELECOXIB 200 MG PO CAPS
200.0000 mg | ORAL_CAPSULE | ORAL | Status: AC
Start: 2023-11-21 — End: 2023-11-21
  Administered 2023-11-21: 200 mg via ORAL

## 2023-11-21 MED ORDER — GABAPENTIN 300 MG PO CAPS
ORAL_CAPSULE | ORAL | Status: AC
  Filled 2023-11-21: qty 1

## 2023-11-21 MED ORDER — DEXAMETHASONE SODIUM PHOSPHATE 10 MG/ML IJ SOLN
INTRAMUSCULAR | Status: DC | PRN
  Administered 2023-11-21: 5 mg via INTRAVENOUS

## 2023-11-21 MED ORDER — ONDANSETRON HCL 4 MG/2ML IJ SOLN
INTRAMUSCULAR | Status: DC | PRN
  Administered 2023-11-21: 4 mg via INTRAVENOUS

## 2023-11-21 MED ORDER — MIDAZOLAM HCL 2 MG/2ML IJ SOLN
INTRAMUSCULAR | Status: DC | PRN
  Administered 2023-11-21: 1 mg via INTRAVENOUS

## 2023-11-21 MED ORDER — SEVOFLURANE IN SOLN
RESPIRATORY_TRACT | Status: AC
Start: 2023-11-21 — End: ?
  Filled 2023-11-21: qty 250

## 2023-11-21 MED ORDER — FENTANYL CITRATE (PF) 100 MCG/2ML IJ SOLN
25.0000 ug | INTRAMUSCULAR | Status: DC | PRN
Start: 2023-11-21 — End: 2023-11-21
  Administered 2023-11-21 (×2): 25 ug via INTRAVENOUS

## 2023-11-21 SURGICAL SUPPLY — 44 items
BLADE CLIPPER SURG (BLADE) ×1 IMPLANT
COVER TIP SHEARS 8 DVNC (MISCELLANEOUS) ×1 IMPLANT
COVER WAND RF STERILE (DRAPES) ×1 IMPLANT
DERMABOND ADVANCED .7 DNX12 (GAUZE/BANDAGES/DRESSINGS) ×1 IMPLANT
DRAPE ARM DVNC X/XI (DISPOSABLE) ×3 IMPLANT
DRAPE COLUMN DVNC XI (DISPOSABLE) ×1 IMPLANT
DRAPE UTILITY 15X26 TOWEL STRL (DRAPES) ×1 IMPLANT
ELECT REM PT RETURN 9FT ADLT (ELECTROSURGICAL) ×1
ELECTRODE REM PT RTRN 9FT ADLT (ELECTROSURGICAL) ×1 IMPLANT
FORCEPS BPLR R/ABLATION 8 DVNC (INSTRUMENTS) ×1 IMPLANT
GLOVE ORTHO TXT STRL SZ7.5 (GLOVE) ×3 IMPLANT
GOWN STRL REUS W/ TWL LRG LVL3 (GOWN DISPOSABLE) ×1 IMPLANT
GOWN STRL REUS W/ TWL XL LVL3 (GOWN DISPOSABLE) ×2 IMPLANT
GRASPER SUT TROCAR 14GX15 (MISCELLANEOUS) IMPLANT
IRRIGATION STRYKERFLOW (MISCELLANEOUS) IMPLANT
IRRIGATOR STRYKERFLOW (MISCELLANEOUS)
IRRIGATOR SUCT 8 DISP DVNC XI (IRRIGATION / IRRIGATOR) IMPLANT
IV NS 1000ML BAXH (IV SOLUTION) IMPLANT
KIT PINK PAD W/HEAD ARE REST (MISCELLANEOUS) ×1
KIT PINK PAD W/HEAD ARM REST (MISCELLANEOUS) ×1 IMPLANT
LABEL OR SOLS (LABEL) ×1 IMPLANT
MANIFOLD NEPTUNE II (INSTRUMENTS) ×1 IMPLANT
MESH 3DMAX LIGHT 4.1X6.2 RT LR (Mesh General) IMPLANT
NDL DRIVE SUT CUT DVNC (INSTRUMENTS) ×1 IMPLANT
NDL HYPO 22X1.5 SAFETY MO (MISCELLANEOUS) ×1 IMPLANT
NDL INSUFFLATION 14GA 120MM (NEEDLE) IMPLANT
NEEDLE DRIVE SUT CUT DVNC (INSTRUMENTS) ×1 IMPLANT
NEEDLE HYPO 22X1.5 SAFETY MO (MISCELLANEOUS) ×1 IMPLANT
NEEDLE INSUFFLATION 14GA 120MM (NEEDLE) IMPLANT
PACK LAP CHOLECYSTECTOMY (MISCELLANEOUS) ×1 IMPLANT
SCISSORS MNPLR CVD DVNC XI (INSTRUMENTS) ×1 IMPLANT
SEAL UNIV 5-12 XI (MISCELLANEOUS) ×3 IMPLANT
SET TUBE SMOKE EVAC HIGH FLOW (TUBING) ×1 IMPLANT
SOL ELECTROSURG ANTI STICK (MISCELLANEOUS) ×1
SOLUTION ELECTROSURG ANTI STCK (MISCELLANEOUS) ×1 IMPLANT
SUT MNCRL 4-0 27XMFL (SUTURE) ×1
SUT STRATA 2-0 23CM CT-2 (SUTURE) IMPLANT
SUT STRATA 3-0 SH (SUTURE) IMPLANT
SUT VIC AB 2-0 SH 27XBRD (SUTURE) ×1 IMPLANT
SUT VIC AB 3-0 SH 27X BRD (SUTURE) IMPLANT
SUT VICRYL 0 UR6 27IN ABS (SUTURE) IMPLANT
SUTURE MNCRL 4-0 27XMF (SUTURE) ×1 IMPLANT
TRAP FLUID SMOKE EVACUATOR (MISCELLANEOUS) ×1 IMPLANT
WATER STERILE IRR 500ML POUR (IV SOLUTION) ×1 IMPLANT

## 2023-11-21 NOTE — Anesthesia Postprocedure Evaluation (Signed)
Anesthesia Post Note  Patient: Jerry Pena  Procedure(s) Performed: XI ROBOTIC ASSISTED INGUINAL HERNIA RIGHT (Right)  Patient location during evaluation: PACU Anesthesia Type: General Level of consciousness: awake and alert, oriented and patient cooperative Pain management: pain level controlled Vital Signs Assessment: post-procedure vital signs reviewed and stable Respiratory status: spontaneous breathing, nonlabored ventilation and respiratory function stable Cardiovascular status: blood pressure returned to baseline and stable Postop Assessment: adequate PO intake Anesthetic complications: no   No notable events documented.   Last Vitals:  Vitals:   11/21/23 1110 11/21/23 1116  BP:  138/81  Pulse:    Resp: (!) 9 16  Temp:    SpO2:      Last Pain:  Vitals:   11/21/23 1116  TempSrc:   PainSc: 4                  Reed Breech

## 2023-11-21 NOTE — Interval H&P Note (Signed)
History and Physical Interval Note:  11/21/2023 7:52 AM  Jerry Pena  has presented today for surgery, with the diagnosis of right inguinal hernia.  The various methods of treatment have been discussed with the patient and family. After consideration of risks, benefits and other options for treatment, the patient has consented to  Procedure(s): XI ROBOTIC ASSISTED INGUINAL HERNIA, possible bilateral (Right) as a surgical intervention.  The patient's history has been reviewed, patient examined, no change in status, stable for surgery.  I have reviewed the patient's chart and labs.  Questions were answered to the patient's satisfaction.   The right side is marked.   [name]

## 2023-11-21 NOTE — Op Note (Signed)
Robotic assisted Laparoscopic Transabdominal Right Inguinal Hernia Repair with Mesh       Pre-operative Diagnosis:  Right Inguinal Hernia   Post-operative Diagnosis: Same   Procedure: Robotic assisted Laparoscopic  repair of right indirect inguinal hernia(s)   Surgeon: [name], M.D., FACS   Anesthesia: GETA   Findings: Right indirect inguinal hernia, no evidence of left sided hernia.         Procedure Details  The patient was seen again in the Holding Room. The benefits, complications, treatment options, and expected outcomes were discussed with the patient. The risks of bleeding, infection, recurrence of symptoms, failure to resolve symptoms, recurrence of hernia, ischemic orchitis, chronic pain syndrome or neuroma, were reviewed again. The likelihood of improving the patient's symptoms with return to their baseline status is good.  The patient and/or family concurred with the proposed plan, giving informed consent.  The patient was taken to Operating Room, identified  and the procedure verified as Laparoscopic Inguinal Hernia Repair. Laterality confirmed.  A Time Out was held and the above information confirmed.   Prior to the induction of general anesthesia, antibiotic prophylaxis was administered. VTE prophylaxis was in place. General endotracheal anesthesia was then administered and tolerated well. After the induction, the abdomen was prepped with Chloraprep and draped in the sterile fashion. The patient was positioned in the supine position.  A Foley catheter was placed easily due to uncertainty of the patient's bladder emptying status.     After local infiltration of quarter percent Marcaine with epinephrine, stab incision was made left upper quadrant.  On the left at Palmer's point, the Veress needle is passed with sensation of the layers to penetrate the abdominal wall and into the peritoneum.  Saline drop test is confirmed peritoneal placement.  Insufflation is initiated with  carbon dioxide to pressures of 15 mmHg. An 8.5 mm port is placed to the left off of the midline, with blunt tipped trocar.  Pneumoperitoneum maintained w/o HD changes to pressures of 15 mm Hg with CO2. No evidence of bowel injuries.  Two 8.5 mm ports placed under direct vision in each upper quadrant. The laparoscopy revealed a small amount of blood on the omentum adjacent to the transverse colon in the right upper quadrant.  I evaluated this and ensured there was no significant bleeding, and no penetration of viscus.  The right indirect defect(s), is noted, there was some omental adhesions to the scarring from his prior prostatic surgery..   The robot was brought to the table and docked in the standard fashion, no collision between arms was observed. Instruments were kept under direct view at all times. For right inguinal hernia repair,  I developed a peritoneal flap. The sac(s) were reduced and dissected free from adjacent structures. We preserved the vas and the vessels, and visualized them to their convergence and beyond in the retroperitoneum.  Dissection of the bladder from the pubic rami was somewhat challenging due to scarring, this was meticulously dissected free to allow adequate placement of the mesh.  Once dissection was completed a right sided  large  BARD 3D Light mesh was placed and secured at 5 points with interrupted 2-0 Vicryl to the pubic tubercle and anteriorly. There was good coverage of the direct, indirect and femoral spaces.  Second look revealed no complications or injuries.  The flap was then closed with 3-0 V-lock suture, peritoneal closure without defects.  Once assuring that hemostasis was adequate, all needles/sponges removed, and the robot was undocked.  Under direct  visualization I placed the Veress needle into the preperitoneal space the Veress' valve was released allowing extraperitoneal CO2 to escape, it was also used to access the space for supplemental local  anesthesia. The ports were removed, the abdomen desulflated.  4-0 subcuticular Monocryl was used at all skin edges. Dermabond was placed.  Patient tolerated the procedure well. There were no complications. He was taken to the recovery room in stable condition.           [name], M.D., FACS 11/21/2023, 10:35 AM

## 2023-11-21 NOTE — Transfer of Care (Signed)
Immediate Anesthesia Transfer of Care Note  Patient: Jerry Pena  Procedure(s) Performed: XI ROBOTIC ASSISTED INGUINAL HERNIA RIGHT (Right)  Patient Location: PACU  Anesthesia Type:General  Level of Consciousness: awake  Airway & Oxygen Therapy: Patient Spontanous Breathing and Patient connected to face mask oxygen  Post-op Assessment: Report given to RN and Post -op Vital signs reviewed and stable  Post vital signs: Reviewed and stable  Last Vitals: See PACU flow sheet for stable VS Vitals Value Taken Time  BP 135/76 11/21/23 1032  Temp    Pulse    Resp 18 11/21/23 1034  SpO2    Vitals shown include unfiled device data.  Last Pain:  Vitals:   11/21/23 0615  TempSrc: Temporal  PainSc: 0-No pain         Complications: No notable events documented.

## 2023-11-21 NOTE — H&P (Signed)
Chief Complaint: Right inguinal hernia   History of Present Illness Jerry Pena is a 72 y.o. male with right groin bulge, progressive over 1 year.  Has pain at times, but typically not provoked by coughing sneezing or heavy lifting.  He can reduce it, having it spontaneously recur with prolonged standing, or walking.  History of prostatectomy and prostatic radiation.  Denies any history of nausea, vomiting or abdominal pain.   Past Medical History     Past Medical History:  Diagnosis Date   Beta thalassemia trait 09/27/2018   Cancer (HCC) 10/2017    Prostate   DDD (degenerative disc disease), lumbar 06/13/2017    Lumbar imaging June 2018   Elevated PSA 09/12/2017    Refer to urologist, Sept 2018   Facet hypertrophy of lumbar region 06/13/2017    Lumbar imaging June 2018   GERD (gastroesophageal reflux disease)      occ no meds   Hyperlipidemia                   Past Surgical History:  Procedure Laterality Date   CATARACT EXTRACTION W/ INTRAOCULAR LENS IMPLANT Left 3/ 28/2014    eyes   CATARACT EXTRACTION W/ INTRAOCULAR LENS IMPLANT Right 04/23/2013   COLONOSCOPY WITH PROPOFOL N/A 07/29/2019    Procedure: COLONOSCOPY WITH PROPOFOL;  Surgeon: Toney Reil, MD;  Location: [facility];  Service: Gastroenterology;  Laterality: N/A;   CYSTOSCOPY W/ RETROGRADES Bilateral 02/21/2021    Procedure: CYSTOSCOPY WITH RETROGRADE PYELOGRAM;  Surgeon: Vanna Scotland, MD;  Location: [facility];  Service: Urology;  Laterality: Bilateral;   CYSTOSCOPY WITH BIOPSY N/A 02/21/2021    Procedure: CYSTOSCOPY WITH BIOPSY and fulgeration;  Surgeon: Vanna Scotland, MD;  Location: [facility];  Service: Urology;  Laterality: N/A;   FLEXIBLE SIGMOIDOSCOPY N/A 11/14/2019    Procedure: FLEXIBLE SIGMOIDOSCOPY;  Surgeon: Toney Reil, MD;  Location: [facility];  Service: Gastroenterology;  Laterality: N/A;   PELVIC LYMPH NODE DISSECTION N/A 12/10/2017    Procedure: PELVIC LYMPH NODE DISSECTION;   Surgeon: Vanna Scotland, MD;  Location: [facility];  Service: Urology;  Laterality: N/A;   PROSTATE SURGERY       ROBOT ASSISTED LAPAROSCOPIC RADICAL PROSTATECTOMY N/A 12/10/2017    Procedure: ROBOTIC ASSISTED LAPAROSCOPIC RADICAL PROSTATECTOMY;  Surgeon: Vanna Scotland, MD;  Location: [facility];  Service: Urology;  Laterality: N/A;          Allergies       Allergies  Allergen Reactions   Aspirin Other (See Comments)      Upset stomach, indigestion and heart burn   Penicillins Hives and Other (See Comments)      Has patient had a PCN reaction causing immediate rash, facial/tongue/throat swelling, SOB or lightheadedness with hypotension: Yes Has patient had a PCN reaction causing severe rash involving mucus membranes or skin necrosis: No Has patient had a PCN reaction that required hospitalization: Yes Has patient had a PCN reaction occurring within the last 10 years: No If all of the above answers are "NO", then may proceed with Cephalosporin use.                Current Outpatient Medications  Medication Sig Dispense Refill   acetaminophen (TYLENOL) 500 MG tablet Take 1,000 mg by mouth every 6 (six) hours as needed for moderate pain or headache.        calcium-vitamin D (OSCAL WITH D) 500-200 MG-UNIT tablet Take 1 tablet by mouth daily.       docusate sodium (  COLACE) 100 MG capsule Take 100 mg by mouth daily.       folic acid (FOLVITE) 400 MCG tablet Take 1 tablet (400 mcg total) by mouth daily. 90 tablet 4   ibuprofen (ADVIL,MOTRIN) 200 MG tablet Take 400 mg by mouth every 6 (six) hours as needed for headache or moderate pain.       Melatonin 5 MG TABS Take 5 mg by mouth daily as needed.       Multiple Vitamin (MULTIVITAMIN) tablet Take 1 tablet by mouth daily.       polyethylene glycol (MIRALAX / GLYCOLAX) 17 g packet Take 17 g by mouth daily.       vitamin B-12 (CYANOCOBALAMIN) 100 MCG tablet Take 100 mcg by mouth daily.       vitamin C (ASCORBIC ACID) 500 MG tablet Take 500 mg  by mouth daily.          No current facility-administered medications for this visit.        Family History      Family History  Problem Relation Age of Onset   Stroke Mother     Stroke Father     Cancer Sister          breast, lung   Pulmonary Hypertension Sister     Cancer Son          prostate   Prostate cancer Son     Hypertension Sister     Bladder Cancer Neg Hx     Kidney cancer Neg Hx              Social History Social History  Social History         Tobacco Use   Smoking status: Some Days      Types: Cigars   Smokeless tobacco: Never   Tobacco comments:      occasional cigar  Vaping Use   Vaping status: Never Used  Substance Use Topics   Alcohol use: Yes      Alcohol/week: 0.0 standard drinks of alcohol      Comment: beer rarely   Drug use: No      Comment: used marijuana more than 10 years ago            Review of Systems  Constitutional: Negative.   HENT: Negative.    Eyes:  Positive for blurred vision.  Cardiovascular: Negative.   Gastrointestinal:  Positive for constipation.  Genitourinary: Negative.   Skin: Negative.   Neurological: Negative.   Psychiatric/Behavioral: Negative.          Physical Exam    CONSTITUTIONAL: Well developed, and nourished, appropriately responsive and aware without distress.   EYES: Sclera non-icteric.   EARS, NOSE, MOUTH AND THROAT:  The oropharynx is clear. Oral mucosa is pink and moist.   Hearing is intact to voice.  NECK: Trachea is midline, and there is no jugular venous distension.  LYMPH NODES:  Lymph nodes in the neck are not appreciated. RESPIRATORY:  Lungs are clear, and breath sounds are equal bilaterally.  Normal respiratory effort without pathologic use of accessory muscles. CARDIOVASCULAR: Heart is regular in rate and rhythm.   Well perfused.  GI: The abdomen is  soft, nontender, and nondistended. There were no palpable masses.  I did not appreciate hepatosplenomegaly. GU: Readily  appreciated right groin bulge consistent with right inguinal hernia, cannot appreciate hernia on the left side.  Testes descended bilaterally. MUSCULOSKELETAL:  Symmetrical muscle tone appreciated in all four extremities.  SKIN: Skin turgor is normal. No pathologic skin lesions appreciated.  NEUROLOGIC:  Motor and sensation appear grossly normal.  Cranial nerves are grossly without defect. PSYCH:  Alert and oriented to person, place and time. Affect is appropriate for situation.   Data Reviewed I have personally reviewed what is currently available of the patient's imaging, recent labs and medical records.   Labs:      Latest Ref Rng & Units 10/10/2023    2:40 PM 10/04/2022    3:47 PM 02/17/2021    9:02 AM  CBC  WBC 3.8 - 10.8 Thousand/uL 4.3  4.6  3.7   Hemoglobin 13.2 - 17.1 g/dL 53.6  64.4  03.4   Hematocrit 38.5 - 50.0 % 41.5  40.4  34.0   Platelets 140 - 400 Thousand/uL 373  332  360         Latest Ref Rng & Units 10/10/2023    2:40 PM 10/04/2022    3:47 PM 02/17/2021    9:02 AM  CMP  Glucose 65 - 99 mg/dL 91  93  96   BUN 7 - 25 mg/dL 11  19  14    Creatinine 0.70 - 1.28 mg/dL 7.42  5.95  6.38   Sodium 135 - 146 mmol/L 141  141  137   Potassium 3.5 - 5.3 mmol/L 5.4  4.3  4.0   Chloride 98 - 110 mmol/L 103  105  104   CO2 20 - 32 mmol/L 31  29  27    Calcium 8.6 - 10.3 mg/dL 9.5  9.8  9.2   Total Protein 6.1 - 8.1 g/dL 6.9  7.1     Total Bilirubin 0.2 - 1.2 mg/dL 0.4  0.8     AST 10 - 35 U/L 12  19     ALT 9 - 46 U/L 8  9           Imaging: Radiological images reviewed:    Within last 24 hrs: No results found.   Assessment   Assessment Right inguinal hernia     Patient Active Problem List    Diagnosis Date Noted   Impingement syndrome of shoulder region 07/07/2021   Osteoarthritis of right shoulder 07/07/2021   Pain in joint, hand 07/07/2021   Lipoma of shoulder 07/07/2021   Right inguinal hernia 10/21/2020   Chronic constipation 10/14/2019   Rectal  bleeding 10/14/2019   Radiation proctitis     Migraine 01/23/2019   Cataract 01/23/2019   Beta thalassemia trait 09/27/2018   Neutropenia (HCC) 09/23/2018   History of hormone therapy 06/07/2018   Prostate cancer (HCC) 12/10/2017   Facet hypertrophy of lumbar region 06/13/2017   DDD (degenerative disc disease), lumbar 06/13/2017   Hyperlipidemia 09/09/2015   Sciatica 09/09/2015   Arthritis, lumbar spine 09/09/2015   Tobacco abuse 09/09/2015   History of colonic polyps 09/09/2015      Plan     Plan Robotic repair of right inguinal hernia, possible bilateral. I discussed possibility of incarceration, strangulation, enlargement in size over time, and the need for emergency surgery in the face of these.  Also reviewed the techniques of reduction should incarceration occur, and when unsuccessful to present to the ED.  Also discussed that surgery risks include recurrence which can be up to 30% in the case of complex hernias, use of prosthetic materials (mesh) and the increased risk of infection and the possible need for re-operation and removal of mesh, possibility of post-op SBO or ileus, and the risks of  general anesthetic including heart attack, stroke, sudden death or some reaction to anesthetic medications. The patient, and those present, appear to understand the risks, any and all questions were answered to the patient's satisfaction.  No guarantees were ever expressed or implied.      Face-to-face time spent with the patient and accompanying care providers(if present) was 30 minutes, with more than 50% of the time spent counseling, educating, and coordinating care of the patient.     These notes generated with voice recognition software. I apologize for typographical errors.

## 2023-11-21 NOTE — Anesthesia Procedure Notes (Addendum)
Procedure Name: Intubation Date/Time: 11/21/2023 8:09 AM  Performed by: Genia Del, CRNAPre-anesthesia Checklist: Patient identified, Patient being monitored, Timeout performed, Emergency Drugs available and Suction available Patient Re-evaluated:Patient Re-evaluated prior to induction Oxygen Delivery Method: Circle system utilized Preoxygenation: Pre-oxygenation with 100% oxygen Induction Type: IV induction Ventilation: Mask ventilation without difficulty Laryngoscope Size: McGrath and 3 Grade View: Grade I Tube type: Oral Tube size: 7.0 mm Number of attempts: 1 Airway Equipment and Method: Stylet Placement Confirmation: ETT inserted through vocal cords under direct vision, positive ETCO2 and breath sounds checked- equal and bilateral Secured at: 22 cm Tube secured with: Tape Dental Injury: Teeth and Oropharynx as per pre-operative assessment  Comments: Easy mask ventilation.  Eyes taped closed after induced prior to DL.  Twitch view on after asleep.   Large glottic opening after waiting for Twitch view to reach 0 TO4.  Easy intutabation.  ETT secured well with X style pink tape and reenforced wtith 1 inch tape. Adhesive goggles placed over aped closed eyes.  Foam donut pillow threaded over ETT to protect entire face from robot arms.

## 2023-11-21 NOTE — Anesthesia Preprocedure Evaluation (Addendum)
Anesthesia Evaluation  Patient identified by MRN, date of birth, ID band Patient awake    Reviewed: Allergy & Precautions, NPO status , Patient's Chart, lab work & pertinent test results  History of Anesthesia Complications Negative for: history of anesthetic complications  Airway Mallampati: I   Neck ROM: Full    Dental  (+) Missing   Pulmonary Current Smoker (2 cigars per day) and Patient abstained from smoking.   Pulmonary exam normal breath sounds clear to auscultation       Cardiovascular Exercise Tolerance: Good Normal cardiovascular exam Rhythm:Regular Rate:Normal  ECG 11/16/23: normal   Neuro/Psych negative neurological ROS     GI/Hepatic ,GERD  ,,  Endo/Other  negative endocrine ROS    Renal/GU negative Renal ROS   Prostate CA    Musculoskeletal   Abdominal   Peds  Hematology  (+) Blood dyscrasia, anemia   Anesthesia Other Findings   Reproductive/Obstetrics                             Anesthesia Physical Anesthesia Plan  ASA: 2  Anesthesia Plan: General   Post-op Pain Management:    Induction: Intravenous  PONV Risk Score and Plan: 1 and Ondansetron, Dexamethasone and Treatment may vary due to age or medical condition  Airway Management Planned: Oral ETT  Additional Equipment:   Intra-op Plan:   Post-operative Plan: Extubation in OR  Informed Consent: I have reviewed the patients History and Physical, chart, labs and discussed the procedure including the risks, benefits and alternatives for the proposed anesthesia with the patient or authorized representative who has indicated his/her understanding and acceptance.     Dental advisory given  Plan Discussed with: CRNA  Anesthesia Plan Comments: (Patient consented for risks of anesthesia including but not limited to:  - adverse reactions to medications - damage to eyes, teeth, lips or other oral mucosa -  nerve damage due to positioning  - sore throat or hoarseness - damage to heart, brain, nerves, lungs, other parts of body or loss of life  Informed patient about role of CRNA in peri- and intra-operative care.  Patient voiced understanding.)        Anesthesia Quick Evaluation

## 2023-11-22 ENCOUNTER — Emergency Department: Payer: 59

## 2023-11-22 ENCOUNTER — Observation Stay: Payer: 59

## 2023-11-22 ENCOUNTER — Inpatient Hospital Stay
Admission: EM | Admit: 2023-11-22 | Discharge: 2023-11-30 | DRG: 351 | Disposition: A | Discharge status: Home / Self Care | Payer: 59 | Attending: Hospitalist | Admitting: Hospitalist

## 2023-11-22 ENCOUNTER — Other Ambulatory Visit: Payer: Self-pay

## 2023-11-22 DIAGNOSIS — E785 Hyperlipidemia, unspecified: Secondary | ICD-10-CM | POA: Diagnosis not present

## 2023-11-22 DIAGNOSIS — Z9842 Cataract extraction status, left eye: Secondary | ICD-10-CM

## 2023-11-22 DIAGNOSIS — D72829 Elevated white blood cell count, unspecified: Secondary | ICD-10-CM | POA: Diagnosis not present

## 2023-11-22 DIAGNOSIS — Z0189 Encounter for other specified special examinations: Secondary | ICD-10-CM | POA: Diagnosis not present

## 2023-11-22 DIAGNOSIS — Z8249 Family history of ischemic heart disease and other diseases of the circulatory system: Secondary | ICD-10-CM

## 2023-11-22 DIAGNOSIS — Z886 Allergy status to analgesic agent status: Secondary | ICD-10-CM

## 2023-11-22 DIAGNOSIS — I10 Essential (primary) hypertension: Secondary | ICD-10-CM | POA: Diagnosis not present

## 2023-11-22 DIAGNOSIS — Z9841 Cataract extraction status, right eye: Secondary | ICD-10-CM

## 2023-11-22 DIAGNOSIS — Z79899 Other long term (current) drug therapy: Secondary | ICD-10-CM

## 2023-11-22 DIAGNOSIS — R112 Nausea with vomiting, unspecified: Principal | ICD-10-CM | POA: Diagnosis present

## 2023-11-22 DIAGNOSIS — Z823 Family history of stroke: Secondary | ICD-10-CM

## 2023-11-22 DIAGNOSIS — Z8546 Personal history of malignant neoplasm of prostate: Secondary | ICD-10-CM

## 2023-11-22 DIAGNOSIS — K4090 Unilateral inguinal hernia, without obstruction or gangrene, not specified as recurrent: Secondary | ICD-10-CM | POA: Diagnosis present

## 2023-11-22 DIAGNOSIS — K219 Gastro-esophageal reflux disease without esophagitis: Secondary | ICD-10-CM | POA: Diagnosis present

## 2023-11-22 DIAGNOSIS — Z880 Allergy status to penicillin: Secondary | ICD-10-CM

## 2023-11-22 DIAGNOSIS — K9189 Other postprocedural complications and disorders of digestive system: Principal | ICD-10-CM | POA: Diagnosis present

## 2023-11-22 DIAGNOSIS — Y838 Other surgical procedures as the cause of abnormal reaction of the patient, or of later complication, without mention of misadventure at the time of the procedure: Secondary | ICD-10-CM | POA: Diagnosis present

## 2023-11-22 DIAGNOSIS — K8500 Idiopathic acute pancreatitis without necrosis or infection: Secondary | ICD-10-CM | POA: Diagnosis not present

## 2023-11-22 DIAGNOSIS — Z961 Presence of intraocular lens: Secondary | ICD-10-CM | POA: Diagnosis present

## 2023-11-22 DIAGNOSIS — Z8042 Family history of malignant neoplasm of prostate: Secondary | ICD-10-CM

## 2023-11-22 DIAGNOSIS — Z9079 Acquired absence of other genital organ(s): Secondary | ICD-10-CM

## 2023-11-22 DIAGNOSIS — F17290 Nicotine dependence, other tobacco product, uncomplicated: Secondary | ICD-10-CM | POA: Diagnosis present

## 2023-11-22 DIAGNOSIS — K567 Ileus, unspecified: Secondary | ICD-10-CM | POA: Diagnosis present

## 2023-11-22 HISTORY — DX: Nausea with vomiting, unspecified: R11.2

## 2023-11-22 LAB — CBC
HCT: 42.3 % (ref 39.0–52.0)
Hemoglobin: 13.2 g/dL (ref 13.0–17.0)
MCH: 22.5 pg — ABNORMAL LOW (ref 26.0–34.0)
MCHC: 31.2 g/dL (ref 30.0–36.0)
MCV: 72.1 fL — ABNORMAL LOW (ref 80.0–100.0)
Platelets: 373 10*3/uL (ref 150–400)
RBC: 5.87 MIL/uL — ABNORMAL HIGH (ref 4.22–5.81)
RDW: 16.4 % — ABNORMAL HIGH (ref 11.5–15.5)
WBC: 13.9 10*3/uL — ABNORMAL HIGH (ref 4.0–10.5)
nRBC: 0 % (ref 0.0–0.2)

## 2023-11-22 LAB — COMPREHENSIVE METABOLIC PANEL
ALT: 13 U/L (ref 0–44)
AST: 30 U/L (ref 15–41)
Albumin: 4.4 g/dL (ref 3.5–5.0)
Alkaline Phosphatase: 85 U/L (ref 38–126)
Anion gap: 11 (ref 5–15)
BUN: 16 mg/dL (ref 8–23)
CO2: 27 mmol/L (ref 22–32)
Calcium: 9.3 mg/dL (ref 8.9–10.3)
Chloride: 99 mmol/L (ref 98–111)
Creatinine, Ser: 1 mg/dL (ref 0.61–1.24)
GFR, Estimated: >60 mL/min (ref >60)
Glucose, Bld: 134 mg/dL — ABNORMAL HIGH (ref 70–99)
Potassium: 4.6 mmol/L (ref 3.5–5.1)
Sodium: 137 mmol/L (ref 135–145)
Total Bilirubin: 1 mg/dL (ref <1.2)
Total Protein: 8.1 g/dL (ref 6.5–8.1)

## 2023-11-22 LAB — URINALYSIS, ROUTINE W REFLEX MICROSCOPIC
Bilirubin Urine: NEGATIVE
Glucose, UA: NEGATIVE mg/dL
Hgb urine dipstick: NEGATIVE
Ketones, ur: NEGATIVE mg/dL
Leukocytes,Ua: NEGATIVE
Nitrite: NEGATIVE
Protein, ur: NEGATIVE mg/dL
Specific Gravity, Urine: >1.046 — ABNORMAL HIGH (ref 1.005–1.030)
pH: 6 (ref 5.0–8.0)

## 2023-11-22 LAB — LIPASE, BLOOD: Lipase: 397 U/L — ABNORMAL HIGH (ref 11–51)

## 2023-11-22 MED ORDER — ACETAMINOPHEN 325 MG PO TABS: 650.0000 mg | ORAL_TABLET | Freq: Four times a day (QID) | ORAL | Status: DC | PRN

## 2023-11-22 MED ORDER — FOLIC ACID 1 MG PO TABS
0.5000 mg | ORAL_TABLET | Freq: Every day | ORAL | Status: DC
  Administered 2023-11-23: 0.5 mg via ORAL
  Filled 2023-11-22 (×5): qty 1

## 2023-11-22 MED ORDER — ENOXAPARIN SODIUM 40 MG/0.4ML IJ SOSY
40.0000 mg | PREFILLED_SYRINGE | INTRAMUSCULAR | Status: DC
  Administered 2023-11-22 – 2023-11-29 (×7): 40 mg via SUBCUTANEOUS
  Filled 2023-11-22 (×8): qty 0.4

## 2023-11-22 MED ORDER — HYDROMORPHONE HCL 1 MG/ML IJ SOLN
0.5000 mg | Freq: Once | INTRAMUSCULAR | Status: AC
  Administered 2023-11-22: 0.5 mg via INTRAVENOUS
  Filled 2023-11-22: qty 0.5

## 2023-11-22 MED ORDER — OYSTER SHELL CALCIUM/D3 500-5 MG-MCG PO TABS
1.0000 | ORAL_TABLET | Freq: Every day | ORAL | Status: DC
  Administered 2023-11-23: 1 via ORAL
  Filled 2023-11-22 (×5): qty 1

## 2023-11-22 MED ORDER — HYDROCODONE-ACETAMINOPHEN 5-325 MG PO TABS
1.0000 | ORAL_TABLET | Freq: Four times a day (QID) | ORAL | Status: DC | PRN
  Administered 2023-11-23: 1 via ORAL
  Filled 2023-11-22 (×2): qty 1

## 2023-11-22 MED ORDER — TRAZODONE HCL 50 MG PO TABS
25.0000 mg | ORAL_TABLET | Freq: Every evening | ORAL | Status: DC | PRN
  Administered 2023-11-22: 25 mg via ORAL
  Filled 2023-11-22 (×3): qty 1

## 2023-11-22 MED ORDER — OXYCODONE-ACETAMINOPHEN 5-325 MG PO TABS
1.0000 | ORAL_TABLET | Freq: Once | ORAL | Status: AC
  Administered 2023-11-22: 1 via ORAL
  Filled 2023-11-22: qty 1

## 2023-11-22 MED ORDER — ONDANSETRON HCL 4 MG/2ML IJ SOLN
4.0000 mg | Freq: Four times a day (QID) | INTRAMUSCULAR | Status: DC | PRN
  Administered 2023-11-22 – 2023-11-23 (×2): 4 mg via INTRAVENOUS
  Filled 2023-11-22 (×2): qty 2

## 2023-11-22 MED ORDER — SODIUM CHLORIDE 0.9 % IV SOLN
INTRAVENOUS | Status: AC
Start: 2023-11-22 — End: 2023-11-23

## 2023-11-22 MED ORDER — MELATONIN 5 MG PO TABS
10.0000 mg | ORAL_TABLET | Freq: Every day | ORAL | Status: DC | PRN
  Administered 2023-11-22: 10 mg via ORAL
  Filled 2023-11-22 (×3): qty 2

## 2023-11-22 MED ORDER — ONDANSETRON HCL 4 MG PO TABS: 4.0000 mg | ORAL_TABLET | Freq: Four times a day (QID) | ORAL | Status: DC | PRN

## 2023-11-22 MED ORDER — LACTATED RINGERS IV BOLUS
1000.0000 mL | Freq: Once | INTRAVENOUS | Status: AC
  Administered 2023-11-22: 1000 mL via INTRAVENOUS

## 2023-11-22 MED ORDER — IOHEXOL 300 MG/ML  SOLN
100.0000 mL | Freq: Once | INTRAMUSCULAR | Status: AC | PRN
  Administered 2023-11-22: 100 mL via INTRAVENOUS

## 2023-11-22 MED ORDER — MAGNESIUM HYDROXIDE 400 MG/5ML PO SUSP
30.0000 mL | Freq: Every day | ORAL | Status: DC | PRN
  Administered 2023-11-24: 30 mL via ORAL
  Filled 2023-11-22: qty 30

## 2023-11-22 MED ORDER — FOLIC ACID 400 MCG PO TABS: 400.0000 ug | ORAL_TABLET | Freq: Every day | ORAL | Status: DC

## 2023-11-22 MED ORDER — POLYETHYLENE GLYCOL 3350 17 G PO PACK
17.0000 g | PACK | Freq: Every day | ORAL | Status: DC
  Administered 2023-11-25: 17 g via ORAL
  Filled 2023-11-22 (×2): qty 1

## 2023-11-22 MED ORDER — VITAMIN B-12 100 MCG PO TABS
100.0000 ug | ORAL_TABLET | Freq: Every day | ORAL | Status: DC
  Administered 2023-11-23: 100 ug via ORAL
  Filled 2023-11-22 (×8): qty 1

## 2023-11-22 MED ORDER — ADULT MULTIVITAMIN W/MINERALS CH
1.0000 | ORAL_TABLET | Freq: Every day | ORAL | Status: DC
  Administered 2023-11-23: 1 via ORAL
  Filled 2023-11-22 (×5): qty 1

## 2023-11-22 MED ORDER — ONDANSETRON HCL 4 MG/2ML IJ SOLN
4.0000 mg | Freq: Once | INTRAMUSCULAR | Status: AC
Start: 2023-11-22 — End: 2023-11-22
  Administered 2023-11-22: 4 mg via INTRAVENOUS
  Filled 2023-11-22: qty 2

## 2023-11-22 MED ORDER — MORPHINE SULFATE (PF) 4 MG/ML IV SOLN
4.0000 mg | Freq: Once | INTRAVENOUS | Status: AC
  Administered 2023-11-22: 4 mg via INTRAVENOUS
  Filled 2023-11-22: qty 1

## 2023-11-22 MED ORDER — ACETAMINOPHEN 650 MG RE SUPP: 650.0000 mg | Freq: Four times a day (QID) | RECTAL | Status: DC | PRN

## 2023-11-22 MED ORDER — MORPHINE SULFATE (PF) 2 MG/ML IV SOLN
2.0000 mg | INTRAVENOUS | Status: DC | PRN
  Administered 2023-11-23 – 2023-11-24 (×6): 2 mg via INTRAVENOUS
  Filled 2023-11-22 (×8): qty 1

## 2023-11-22 NOTE — ED Provider Notes (Signed)
[facility] Provider Note    Event Date/Time   First MD Initiated Contact with Patient 11/22/23 1644     (approximate)   History   Chief Complaint Abdominal Pain   HPI  ZAKAI MILES is a 72 y.o. male with past medical history of hyperlipidemia and prostate cancer who presents to the ED complaining of abdominal pain.  Patient reports that he underwent laparoscopic inguinal hernia repair with Dr. Claudine Mouton here at [facility] yesterday, has been dealing with significant pain across his abdomen since.  He states that the pain affects his entire abdomen and he has been feeling nauseous, unable to keep anything down since his surgery.  He does report that he continues to pass gas.  He denies any difficulty urinating and has not had any dysuria or flank pain.  He denies any fevers, cough, chest pain, or shortness of breath.      Physical Exam   Triage Vital Signs: ED Triage Vitals  Encounter Vitals Group     BP 11/22/23 1644 (!) 156/80     Systolic BP Percentile --      Diastolic BP Percentile --      Pulse Rate 11/22/23 1644 74     Resp 11/22/23 1644 20     Temp 11/22/23 1644 97.8 F (36.6 C)     Temp Source 11/22/23 1644 Oral     SpO2 11/22/23 1734 95 %     Weight 11/22/23 1648 138 lb (62.6 kg)     Height 11/22/23 1648 5\' 8"  (1.727 m)     Head Circumference --      Peak Flow --      Pain Score 11/22/23 1644 10     Pain Loc --      Pain Education --      Exclude from Growth Chart --     Most recent vital signs: Vitals:   11/22/23 1644 11/22/23 1734  BP: (!) 156/80   Pulse: 74   Resp: 20   Temp: 97.8 F (36.6 C)   SpO2:  95%    Constitutional: Alert and oriented. Eyes: Conjunctivae are normal. Head: Atraumatic. Nose: No congestion/rhinnorhea. Mouth/Throat: Mucous membranes are moist.  Cardiovascular: Normal rate, regular rhythm. Grossly normal heart sounds.  2+ radial pulses bilaterally. Respiratory: Normal respiratory effort.  No  retractions. Lungs CTAB. Gastrointestinal: Soft and diffusely tender to palpation with no rebound or guarding.  Laparoscopic surgical sites are intact. No distention. Musculoskeletal: No lower extremity tenderness nor edema.  Neurologic:  Normal speech and language. No gross focal neurologic deficits are appreciated.    ED Results / Procedures / Treatments   Labs (all labs ordered are listed, but only abnormal results are displayed) Labs Reviewed  LIPASE, BLOOD - Abnormal; Notable for the following components:      Result Value   Lipase 397 (*)    All other components within normal limits  COMPREHENSIVE METABOLIC PANEL - Abnormal; Notable for the following components:   Glucose, Bld 134 (*)    All other components within normal limits  CBC - Abnormal; Notable for the following components:   WBC 13.9 (*)    RBC 5.87 (*)    MCV 72.1 (*)    MCH 22.5 (*)    RDW 16.4 (*)    All other components within normal limits  URINALYSIS, ROUTINE W REFLEX MICROSCOPIC - Abnormal; Notable for the following components:   Color, Urine YELLOW (*)    APPearance CLEAR (*)  Specific Gravity, Urine >1.046 (*)    All other components within normal limits     EKG  ED ECG REPORT I, Chesley Noon, the attending physician, personally viewed and interpreted this ECG.   Date: 11/22/2023  EKG Time: 16:44  Rate: 87  Rhythm: normal sinus rhythm  Axis: Normal  Intervals:none  ST&T Change: None  RADIOLOGY CT abdomen/pelvis reviewed and interpreted by me with free air following laparoscopic procedure, no inflammatory changes noted.  PROCEDURES:  Critical Care performed: No  Procedures   MEDICATIONS ORDERED IN ED: Medications  morphine (PF) 4 MG/ML injection 4 mg (4 mg Intravenous Given 11/22/23 1723)  ondansetron (ZOFRAN) injection 4 mg (4 mg Intravenous Given 11/22/23 1722)  lactated ringers bolus 1,000 mL (1,000 mLs Intravenous New Bag/Given 11/22/23 1756)  iohexol (OMNIPAQUE) 300 MG/ML  solution 100 mL (100 mLs Intravenous Contrast Given 11/22/23 1827)  HYDROmorphone (DILAUDID) injection 0.5 mg (0.5 mg Intravenous Given 11/22/23 1930)     IMPRESSION / MDM / ASSESSMENT AND PLAN / ED COURSE  I reviewed the triage vital signs and the nursing notes.                              72 y.o. male with past medical history of hyperlipidemia and prostate cancer who presents to the ED with diffuse abdominal pain and intractable vomiting following inguinal hernia repair yesterday.  Patient's presentation is most consistent with acute presentation with potential threat to life or bodily function.  Differential diagnosis includes, but is not limited to, perforated bowel, bowel obstruction, postoperative vomiting, dehydration, electrolyte abnormality, AKI.  Patient nontoxic-appearing and in no acute distress, vital signs are unremarkable.  His abdomen is soft but diffusely tender to palpation, laparoscopic surgical sites do appear to be healing well with no signs of infection.  We will treat symptomatically with IV morphine and Zofran, further assess with CT imaging.  Labs show mild leukocytosis, no significant anemia, electrolyte abnormality, or AKI noted.  LFTs are unremarkable, lipase and urinalysis pending at this time.  CT abdomen/pelvis with some air in the right inguinal area, unclear if this represents bowel loop per radiology.  Case discussed with Dr. Aleen Campi of general surgery, who states that there does not appear to be a bowel loop in this area, and air seen likely expected amount of air following his laparoscopic procedure.  Patient does have elevation of lipase at 397, which could be contributing to his symptoms.  He continues to have significant pain with intractable nausea on reassessment.  Case discussed with hospitalist for admission.      FINAL CLINICAL IMPRESSION(S) / ED DIAGNOSES   Final diagnoses:  Intractable nausea and vomiting     Rx / DC Orders   ED  Discharge Orders     None        Note:  This document was prepared using Dragon voice recognition software and may include unintentional dictation errors.   Chesley Noon, MD 11/22/23 2047

## 2023-11-22 NOTE — Assessment & Plan Note (Signed)
-   This could be stress demargination and possibly associated with acute pancreatitis. - We will follow CBC with above management.

## 2023-11-22 NOTE — Consult Note (Incomplete)
Date of Consultation:  11/22/2023  Requesting Physician:  ***  Reason for Consultation:  ***  History of Present Illness: Jerry Pena is a 72 y.o. male ***  Past Medical History: Past Medical History:  Diagnosis Date  . Beta thalassemia trait 09/27/2018  . Cancer (HCC) 10/2017   Prostate  . Cataract   . Chronic constipation   . Colon polyps   . DDD (degenerative disc disease), lumbar 06/13/2017   Lumbar imaging June 2018  . Elevated PSA 09/12/2017   Refer to urologist, Sept 2018  . Facet hypertrophy of lumbar region 06/13/2017   Lumbar imaging June 2018  . GERD (gastroesophageal reflux disease)    occ no meds  . Hyperlipidemia   . Intractable nausea and vomiting 11/22/2023  . Radiation proctitis   . Right inguinal hernia 10/2023     Past Surgical History: Past Surgical History:  Procedure Laterality Date  . CATARACT EXTRACTION W/ INTRAOCULAR LENS IMPLANT Left 3/ 28/2014   eyes  . CATARACT EXTRACTION W/ INTRAOCULAR LENS IMPLANT Right 04/23/2013  . COLONOSCOPY WITH PROPOFOL N/A 07/29/2019   Procedure: COLONOSCOPY WITH PROPOFOL;  Surgeon: Toney Reil, MD;  Location: [facility] ENDOSCOPY;  Service: Gastroenterology;  Laterality: N/A;  . CYSTOSCOPY W/ RETROGRADES Bilateral 02/21/2021   Procedure: CYSTOSCOPY WITH RETROGRADE PYELOGRAM;  Surgeon: Vanna Scotland, MD;  Location: [facility] ORS;  Service: Urology;  Laterality: Bilateral;  . CYSTOSCOPY WITH BIOPSY N/A 02/21/2021   Procedure: CYSTOSCOPY WITH BIOPSY and fulgeration;  Surgeon: Vanna Scotland, MD;  Location: [facility] ORS;  Service: Urology;  Laterality: N/A;  . FLEXIBLE SIGMOIDOSCOPY N/A 11/14/2019   Procedure: FLEXIBLE SIGMOIDOSCOPY;  Surgeon: Toney Reil, MD;  Location: [facility] ENDOSCOPY;  Service: Gastroenterology;  Laterality: N/A;  . HERNIA REPAIR  11/21/2023  . PELVIC LYMPH NODE DISSECTION N/A 12/10/2017   Procedure: PELVIC LYMPH NODE DISSECTION;  Surgeon: Vanna Scotland, MD;  Location: [facility] ORS;  Service:  Urology;  Laterality: N/A;  . ROBOT ASSISTED LAPAROSCOPIC RADICAL PROSTATECTOMY N/A 12/10/2017   Procedure: ROBOTIC ASSISTED LAPAROSCOPIC RADICAL PROSTATECTOMY;  Surgeon: Vanna Scotland, MD;  Location: [facility] ORS;  Service: Urology;  Laterality: N/A;    Home Medications: Prior to Admission medications   Medication Sig Start Date End Date Taking? Authorizing Provider  calcium-vitamin D (OSCAL WITH D) 500-200 MG-UNIT tablet Take 1 tablet by mouth daily.   Yes [provider]  folic acid (FOLVITE) 400 MCG tablet Take 1 tablet (400 mcg total) by mouth daily. 10/04/18  Yes Rickard Patience, MD  HYDROcodone-acetaminophen (NORCO/VICODIN) 5-325 MG tablet Take 1 tablet by mouth every 6 (six) hours as needed for moderate pain (pain score 4-6). 11/21/23  Yes [name], MD  ibuprofen (ADVIL) 200 MG tablet Take 2 tablets (400 mg total) by mouth every 6 (six) hours as needed for headache or moderate pain (pain score 4-6). 11/21/23  Yes [name], MD  Melatonin 5 MG TABS Take 10 mg by mouth daily as needed (Sleep).   Yes [provider]  Multiple Vitamin (MULTIVITAMIN) tablet Take 1 tablet by mouth daily.   Yes [provider]  polyethylene glycol (MIRALAX / GLYCOLAX) 17 g packet Take 17 g by mouth daily.   Yes [provider]  vitamin B-12 (CYANOCOBALAMIN) 100 MCG tablet Take 100 mcg by mouth daily.   Yes [provider]  vitamin C (ASCORBIC ACID) 500 MG tablet Take 500 mg by mouth daily.   Yes [provider]    Allergies: Allergies  Allergen Reactions  . Aspirin Other (See  Comments)    Upset stomach, indigestion and heart burn  . Penicillins Hives and Other (See Comments)    Has patient had a PCN reaction causing immediate rash, facial/tongue/throat swelling, SOB or lightheadedness with hypotension: Yes Has patient had a PCN reaction causing severe rash involving mucus membranes or skin necrosis: No Has patient had a PCN reaction that required  hospitalization: Yes Has patient had a PCN reaction occurring within the last 10 years: No If all of the above answers are "NO", then may proceed with Cephalosporin use.     Social History:  reports that he quit smoking 3 days ago. His smoking use included cigars. He has never used smokeless tobacco. He reports that he does not currently use alcohol. He reports that he does not currently use drugs after having used the following drugs: Marijuana.   Family History: Family History  Problem Relation Age of Onset  . Stroke Mother   . Stroke Father   . Cancer Sister        breast, lung  . Pulmonary Hypertension Sister   . Cancer Son        prostate  . Prostate cancer Son   . Hypertension Sister   . Bladder Cancer Neg Hx   . Kidney cancer Neg Hx     Review of Systems: ROS  Physical Exam BP 128/71   Pulse 87   Temp 99.4 F (37.4 C) (Oral)   Resp (!) 26   Ht 5\' 8"  (1.727 m)   Wt 62.6 kg   SpO2 95%   BMI 20.98 kg/m  CONSTITUTIONAL: *** HEENT:  Normocephalic, atraumatic, extraocular motion intact. NECK: Trachea is midline, and there is no jugular venous distension. RESPIRATORY:  Lungs are clear, and breath sounds are equal bilaterally. Normal respiratory effort without pathologic use of accessory muscles. CARDIOVASCULAR: Heart is regular without murmurs, gallops, or rubs. GI: The abdomen is ***. There were no palpable masses. There was no hepatosplenomegaly. GU: *** MUSCULOSKELETAL:  Normal muscle strength and tone in all four extremities.  No peripheral edema or cyanosis. SKIN: Skin turgor is normal. There are no pathologic skin lesions.  NEUROLOGIC:  Motor and sensation is grossly normal.  Cranial nerves are grossly intact. PSYCH:  Alert and oriented to person, place and time. Affect is normal.  Laboratory Analysis: Results for orders placed or performed during the hospital encounter of 11/22/23 (from the past 24 hour(s))  Lipase, blood     Status: Abnormal   Collection  Time: 11/22/23  5:02 PM  Result Value Ref Range   Lipase 397 (H) 11 - 51 U/L  Comprehensive metabolic panel     Status: Abnormal   Collection Time: 11/22/23  5:02 PM  Result Value Ref Range   Sodium 137 135 - 145 mmol/L   Potassium 4.6 3.5 - 5.1 mmol/L   Chloride 99 98 - 111 mmol/L   CO2 27 22 - 32 mmol/L   Glucose, Bld 134 (H) 70 - 99 mg/dL   BUN 16 8 - 23 mg/dL   Creatinine, Ser 2.70 0.61 - 1.24 mg/dL   Calcium 9.3 8.9 - 35.0 mg/dL   Total Protein 8.1 6.5 - 8.1 g/dL   Albumin 4.4 3.5 - 5.0 g/dL   AST 30 15 - 41 U/L   ALT 13 0 - 44 U/L   Alkaline Phosphatase 85 38 - 126 U/L   Total Bilirubin 1.0 <1.2 mg/dL   GFR, Estimated >09 >38 mL/min   Anion gap 11 5 - 15  CBC     Status: Abnormal   Collection Time: 11/22/23  5:02 PM  Result Value Ref Range   WBC 13.9 (H) 4.0 - 10.5 K/uL   RBC 5.87 (H) 4.22 - 5.81 MIL/uL   Hemoglobin 13.2 13.0 - 17.0 g/dL   HCT 29.5 62.1 - 30.8 %   MCV 72.1 (L) 80.0 - 100.0 fL   MCH 22.5 (L) 26.0 - 34.0 pg   MCHC 31.2 30.0 - 36.0 g/dL   RDW 65.7 (H) 84.6 - 96.2 %   Platelets 373 150 - 400 K/uL   nRBC 0.0 0.0 - 0.2 %  Urinalysis, Routine w reflex microscopic -Urine, Clean Catch     Status: Abnormal   Collection Time: 11/22/23  6:54 PM  Result Value Ref Range   Color, Urine YELLOW (A) YELLOW   APPearance CLEAR (A) CLEAR   Specific Gravity, Urine >1.046 (H) 1.005 - 1.030   pH 6.0 5.0 - 8.0   Glucose, UA NEGATIVE NEGATIVE mg/dL   Hgb urine dipstick NEGATIVE NEGATIVE   Bilirubin Urine NEGATIVE NEGATIVE   Ketones, ur NEGATIVE NEGATIVE mg/dL   Protein, ur NEGATIVE NEGATIVE mg/dL   Nitrite NEGATIVE NEGATIVE   Leukocytes,Ua NEGATIVE NEGATIVE    Imaging: US Abdomen Limited RUQ (LIVER/GB)  Result Date: 11/22/2023 CLINICAL DATA:  Acute pancreatitis inguinal hernia repair EXAM: ULTRASOUND ABDOMEN LIMITED RIGHT UPPER QUADRANT COMPARISON:  CT 11/22/2023 FINDINGS: Gallbladder: No gallstones or wall thickening visualized. No sonographic Murphy sign noted by  sonographer. Common bile duct: Diameter: 5 mm Liver: No focal lesion identified. Within normal limits in parenchymal echogenicity. Portal vein is patent on color Doppler imaging with normal direction of blood flow towards the liver. Other: None. IMPRESSION: Negative examination. Electronically Signed   By: Jasmine Pang M.D.   On: 11/22/2023 23:48   CT ABDOMEN PELVIS W CONTRAST  Result Date: 11/22/2023 CLINICAL DATA:  Laparoscopic inguinal hernia repair yesterday. Severe pain vomiting. EXAM: CT ABDOMEN AND PELVIS WITH CONTRAST TECHNIQUE: Multidetector CT imaging of the abdomen and pelvis was performed using the standard protocol following bolus administration of intravenous contrast. RADIATION DOSE REDUCTION: This exam was performed according to the departmental dose-optimization program which includes automated exposure control, adjustment of the mA and/or kV according to patient size and/or use of iterative reconstruction technique. CONTRAST:  OMNIPAQUE IOHEXOL 300 MG/ML  SOLN COMPARISON:  CT of the abdomen and pelvis without contrast 01/14/2021 FINDINGS: Lower chest: The lung bases are clear without focal nodule, mass, or airspace disease. Heart size is normal. No significant pleural or pericardial effusion is present. Hepatobiliary: Choose 1 Pancreas: Mild dilation of pancreatic duct is present. No obstructing lesion is present. The pancreas is otherwise within normal limits. Spleen: Normal in size without focal abnormality. Adrenals/Urinary Tract: Adrenal glands are unremarkable. Kidneys are normal, without renal calculi, focal lesion, or hydronephrosis. Bladder is unremarkable. Stomach/Bowel: The stomach and duodenum are within normal limits. The small bowel is within normal limits proximally. Fluid and gas extending into the right inguinal canal. This may represent a loop of bowel although direct continuity is unclear. Moderate stool is present throughout the colon. Tiny locules of free air are  likely within normal limits following surgery yesterday. Vascular/Lymphatic: Atherosclerotic calcifications are present in the aorta and branch vessels. No aneurysm is present. Reproductive: Prostate is unremarkable.  Field Other: No other significant ventral hernia is present. Musculoskeletal: Vertebral body heights are normal. Straightening of the normal lumbar lordosis is present. Mild rightward curvature is present the upper lumbar spine. Endplate  degenerative changes are most evident at L5-S1. The SI joints are fused bilaterally. The hips are located and within normal limits bilaterally. IMPRESSION: 1. Fluid and gas extending into the right inguinal canal. This may represent a loop of bowel although direct continuity is unclear. 2. Tiny locules of free air are likely within normal limits following surgery yesterday. 3. Moderate stool throughout the colon suggests constipation. 4. Mild dilation of the pancreatic duct without obstructing lesion. This is likely incidental. 5. Fusion of the SI joints bilaterally compatible with ankylosing spondylitis. 6.  Aortic Atherosclerosis (ICD10-I70.0). These results were called by telephone at the time of interpretation on 11/22/2023 at 6:52 pm to provider [facility] , who verbally acknowledged these results. Electronically Signed   By: Marin Roberts M.D.   On: 11/22/2023 18:52    Assessment and Plan: This is a 72 y.o. male ***  I spent *** minutes dedicated to the care of this patient on the date of this encounter to include pre-visit review of records, face-to-face time with the patient discussing diagnosis and management, and any post-visit coordination of care.   Howie Ill, MD [facility] Pg:  [phone]

## 2023-11-22 NOTE — Consult Note (Signed)
Date of Consultation:  11/22/2023  Requesting Physician:  Chesley Noon, MD  Reason for Consultation: Abdominal pain  History of Present Illness: Jerry Pena is a 72 y.o. male presenting for evaluation of abdominal pain.  The patient is status post robotic assisted right inguinal hernia repair on 11/21/2023 with Dr. Claudine Mouton.  The patient reports that soon after the numbing medication wore off, he started having significant pain in the mid abdomen as well as in the right groin.  With this has also come nausea and vomiting and he has not been able to keep anything down.  Patient denies having any bowel movements but reports having flatus.  In the emergency room, his workup showed a white blood cell count 13.9 with a lipase of 397.  He had a CT scan of the abdomen pelvis which showed postoperative changes with air within the abdominal wall and in the right groin.  His colon does seem to be more air-filled and with stool.  Although on radiology read, there was concern that in the right groin there was a loop of bowel, I think this is postoperative insufflation rather than a true bowel loop as there is no tracking of this through the abdominal wall into the abdomen itself.  Past Medical History: Past Medical History:  Diagnosis Date   Beta thalassemia trait 09/27/2018   Cancer (HCC) 10/2017   Prostate   Cataract    Chronic constipation    Colon polyps    DDD (degenerative disc disease), lumbar 06/13/2017   Lumbar imaging June 2018   Elevated PSA 09/12/2017   Refer to urologist, Sept 2018   Facet hypertrophy of lumbar region 06/13/2017   Lumbar imaging June 2018   GERD (gastroesophageal reflux disease)    occ no meds   Hyperlipidemia    Intractable nausea and vomiting 11/22/2023   Radiation proctitis    Right inguinal hernia 10/2023     Past Surgical History: Past Surgical History:  Procedure Laterality Date   CATARACT EXTRACTION W/ INTRAOCULAR LENS IMPLANT Left 3/ 28/2014    eyes   CATARACT EXTRACTION W/ INTRAOCULAR LENS IMPLANT Right 04/23/2013   COLONOSCOPY WITH PROPOFOL N/A 07/29/2019   Procedure: COLONOSCOPY WITH PROPOFOL;  Surgeon: Toney Reil, MD;  Location: [facility];  Service: Gastroenterology;  Laterality: N/A;   CYSTOSCOPY W/ RETROGRADES Bilateral 02/21/2021   Procedure: CYSTOSCOPY WITH RETROGRADE PYELOGRAM;  Surgeon: Vanna Scotland, MD;  Location: [facility];  Service: Urology;  Laterality: Bilateral;   CYSTOSCOPY WITH BIOPSY N/A 02/21/2021   Procedure: CYSTOSCOPY WITH BIOPSY and fulgeration;  Surgeon: Vanna Scotland, MD;  Location: [facility];  Service: Urology;  Laterality: N/A;   FLEXIBLE SIGMOIDOSCOPY N/A 11/14/2019   Procedure: FLEXIBLE SIGMOIDOSCOPY;  Surgeon: Toney Reil, MD;  Location: [facility];  Service: Gastroenterology;  Laterality: N/A;   HERNIA REPAIR  11/21/2023   PELVIC LYMPH NODE DISSECTION N/A 12/10/2017   Procedure: PELVIC LYMPH NODE DISSECTION;  Surgeon: Vanna Scotland, MD;  Location: [facility];  Service: Urology;  Laterality: N/A;   ROBOT ASSISTED LAPAROSCOPIC RADICAL PROSTATECTOMY N/A 12/10/2017   Procedure: ROBOTIC ASSISTED LAPAROSCOPIC RADICAL PROSTATECTOMY;  Surgeon: Vanna Scotland, MD;  Location: [facility];  Service: Urology;  Laterality: N/A;    Home Medications: Prior to Admission medications   Medication Sig Start Date End Date Taking? Authorizing Provider  calcium-vitamin D (OSCAL WITH D) 500-200 MG-UNIT tablet Take 1 tablet by mouth daily.   Yes [provider]  folic acid (FOLVITE) 400 MCG tablet Take 1 tablet (400  mcg total) by mouth daily. 10/04/18  Yes Rickard Patience, MD  HYDROcodone-acetaminophen (NORCO/VICODIN) 5-325 MG tablet Take 1 tablet by mouth every 6 (six) hours as needed for moderate pain (pain score 4-6). 11/21/23  Yes [name], MD  ibuprofen (ADVIL) 200 MG tablet Take 2 tablets (400 mg total) by mouth every 6 (six) hours as needed for headache or moderate pain (pain score  4-6). 11/21/23  Yes [name], MD  Melatonin 5 MG TABS Take 10 mg by mouth daily as needed (Sleep).   Yes [provider]  Multiple Vitamin (MULTIVITAMIN) tablet Take 1 tablet by mouth daily.   Yes [provider]  polyethylene glycol (MIRALAX / GLYCOLAX) 17 g packet Take 17 g by mouth daily.   Yes [provider]  vitamin B-12 (CYANOCOBALAMIN) 100 MCG tablet Take 100 mcg by mouth daily.   Yes [provider]  vitamin C (ASCORBIC ACID) 500 MG tablet Take 500 mg by mouth daily.   Yes [provider]    Allergies: Allergies  Allergen Reactions   Aspirin Other (See Comments)    Upset stomach, indigestion and heart burn   Penicillins Hives and Other (See Comments)    Has patient had a PCN reaction causing immediate rash, facial/tongue/throat swelling, SOB or lightheadedness with hypotension: Yes Has patient had a PCN reaction causing severe rash involving mucus membranes or skin necrosis: No Has patient had a PCN reaction that required hospitalization: Yes Has patient had a PCN reaction occurring within the last 10 years: No If all of the above answers are "NO", then may proceed with Cephalosporin use.     Social History:  reports that he quit smoking 3 days ago. His smoking use included cigars. He has never used smokeless tobacco. He reports that he does not currently use alcohol. He reports that he does not currently use drugs after having used the following drugs: Marijuana.   Family History: Family History  Problem Relation Age of Onset   Stroke Mother    Stroke Father    Cancer Sister        breast, lung   Pulmonary Hypertension Sister    Cancer Son        prostate   Prostate cancer Son    Hypertension Sister    Bladder Cancer Neg Hx    Kidney cancer Neg Hx     Review of Systems: Review of Systems  Constitutional:  Negative for chills and fever.  HENT:  Negative for hearing loss.   Respiratory:  Negative for shortness  of breath.   Cardiovascular:  Negative for chest pain.  Gastrointestinal:  Positive for abdominal pain, nausea and vomiting. Negative for diarrhea.  Genitourinary:  Negative for dysuria.  Musculoskeletal:  Negative for myalgias.  Skin:  Negative for rash.  Neurological:  Negative for dizziness.  Psychiatric/Behavioral:  Negative for depression.     Physical Exam BP 128/71   Pulse 87   Temp 99.4 F (37.4 C) (Oral)   Resp (!) 26   Ht 5\' 8"  (1.727 m)   Wt 62.6 kg   SpO2 95%   BMI 20.98 kg/m  CONSTITUTIONAL: No acute distress HEENT:  Normocephalic, atraumatic, extraocular motion intact. NECK: Trachea is midline, and there is no jugular venous distension. RESPIRATORY:  Normal respiratory effort without pathologic use of accessory muscles. CARDIOVASCULAR: Regular rhythm and rate GI: The abdomen is soft, nondistended, with tenderness to palpation in the periumbilical area and in the right groin.  The right groin has an  area of puffiness which is soft and does not change remove when he coughs or strains.  There is no pain in the left groin.  Incisions are clean, dry, intact without any evidence of infection.   MUSCULOSKELETAL:  Normal muscle strength and tone in all four extremities.  No peripheral edema or cyanosis. SKIN: Skin turgor is normal. There are no pathologic skin lesions.  NEUROLOGIC:  Motor and sensation is grossly normal.  Cranial nerves are grossly intact. PSYCH:  Alert and oriented to person, place and time. Affect is normal.  Laboratory Analysis: Results for orders placed or performed during the hospital encounter of 11/22/23 (from the past 24 hour(s))  Lipase, blood     Status: Abnormal   Collection Time: 11/22/23  5:02 PM  Result Value Ref Range   Lipase 397 (H) 11 - 51 U/L  Comprehensive metabolic panel     Status: Abnormal   Collection Time: 11/22/23  5:02 PM  Result Value Ref Range   Sodium 137 135 - 145 mmol/L   Potassium 4.6 3.5 - 5.1 mmol/L   Chloride 99 98  - 111 mmol/L   CO2 27 22 - 32 mmol/L   Glucose, Bld 134 (H) 70 - 99 mg/dL   BUN 16 8 - 23 mg/dL   Creatinine, Ser 8.75 0.61 - 1.24 mg/dL   Calcium 9.3 8.9 - 64.3 mg/dL   Total Protein 8.1 6.5 - 8.1 g/dL   Albumin 4.4 3.5 - 5.0 g/dL   AST 30 15 - 41 U/L   ALT 13 0 - 44 U/L   Alkaline Phosphatase 85 38 - 126 U/L   Total Bilirubin 1.0 <1.2 mg/dL   GFR, Estimated >32 >95 mL/min   Anion gap 11 5 - 15  CBC     Status: Abnormal   Collection Time: 11/22/23  5:02 PM  Result Value Ref Range   WBC 13.9 (H) 4.0 - 10.5 K/uL   RBC 5.87 (H) 4.22 - 5.81 MIL/uL   Hemoglobin 13.2 13.0 - 17.0 g/dL   HCT 18.8 41.6 - 60.6 %   MCV 72.1 (L) 80.0 - 100.0 fL   MCH 22.5 (L) 26.0 - 34.0 pg   MCHC 31.2 30.0 - 36.0 g/dL   RDW 30.1 (H) 60.1 - 09.3 %   Platelets 373 150 - 400 K/uL   nRBC 0.0 0.0 - 0.2 %  Urinalysis, Routine w reflex microscopic -Urine, Clean Catch     Status: Abnormal   Collection Time: 11/22/23  6:54 PM  Result Value Ref Range   Color, Urine YELLOW (A) YELLOW   APPearance CLEAR (A) CLEAR   Specific Gravity, Urine >1.046 (H) 1.005 - 1.030   pH 6.0 5.0 - 8.0   Glucose, UA NEGATIVE NEGATIVE mg/dL   Hgb urine dipstick NEGATIVE NEGATIVE   Bilirubin Urine NEGATIVE NEGATIVE   Ketones, ur NEGATIVE NEGATIVE mg/dL   Protein, ur NEGATIVE NEGATIVE mg/dL   Nitrite NEGATIVE NEGATIVE   Leukocytes,Ua NEGATIVE NEGATIVE    Imaging: US Abdomen Limited RUQ (LIVER/GB)  Result Date: 11/22/2023 CLINICAL DATA:  Acute pancreatitis inguinal hernia repair EXAM: ULTRASOUND ABDOMEN LIMITED RIGHT UPPER QUADRANT COMPARISON:  CT 11/22/2023 FINDINGS: Gallbladder: No gallstones or wall thickening visualized. No sonographic Murphy sign noted by sonographer. Common bile duct: Diameter: 5 mm Liver: No focal lesion identified. Within normal limits in parenchymal echogenicity. Portal vein is patent on color Doppler imaging with normal direction of blood flow towards the liver. Other: None. IMPRESSION: Negative  examination. Electronically Signed   By:  Jasmine Pang M.D.   On: 11/22/2023 23:48   CT ABDOMEN PELVIS W CONTRAST  Result Date: 11/22/2023 CLINICAL DATA:  Laparoscopic inguinal hernia repair yesterday. Severe pain vomiting. EXAM: CT ABDOMEN AND PELVIS WITH CONTRAST TECHNIQUE: Multidetector CT imaging of the abdomen and pelvis was performed using the standard protocol following bolus administration of intravenous contrast. RADIATION DOSE REDUCTION: This exam was performed according to the departmental dose-optimization program which includes automated exposure control, adjustment of the mA and/or kV according to patient size and/or use of iterative reconstruction technique. CONTRAST:  OMNIPAQUE IOHEXOL 300 MG/ML  SOLN COMPARISON:  CT of the abdomen and pelvis without contrast 01/14/2021 FINDINGS: Lower chest: The lung bases are clear without focal nodule, mass, or airspace disease. Heart size is normal. No significant pleural or pericardial effusion is present. Hepatobiliary: Choose 1 Pancreas: Mild dilation of pancreatic duct is present. No obstructing lesion is present. The pancreas is otherwise within normal limits. Spleen: Normal in size without focal abnormality. Adrenals/Urinary Tract: Adrenal glands are unremarkable. Kidneys are normal, without renal calculi, focal lesion, or hydronephrosis. Bladder is unremarkable. Stomach/Bowel: The stomach and duodenum are within normal limits. The small bowel is within normal limits proximally. Fluid and gas extending into the right inguinal canal. This may represent a loop of bowel although direct continuity is unclear. Moderate stool is present throughout the colon. Tiny locules of free air are likely within normal limits following surgery yesterday. Vascular/Lymphatic: Atherosclerotic calcifications are present in the aorta and branch vessels. No aneurysm is present. Reproductive: Prostate is unremarkable.  Field Other: No other significant ventral hernia is  present. Musculoskeletal: Vertebral body heights are normal. Straightening of the normal lumbar lordosis is present. Mild rightward curvature is present the upper lumbar spine. Endplate degenerative changes are most evident at L5-S1. The SI joints are fused bilaterally. The hips are located and within normal limits bilaterally. IMPRESSION: 1. Fluid and gas extending into the right inguinal canal. This may represent a loop of bowel although direct continuity is unclear. 2. Tiny locules of free air are likely within normal limits following surgery yesterday. 3. Moderate stool throughout the colon suggests constipation. 4. Mild dilation of the pancreatic duct without obstructing lesion. This is likely incidental. 5. Fusion of the SI joints bilaterally compatible with ankylosing spondylitis. 6.  Aortic Atherosclerosis (ICD10-I70.0). These results were called by telephone at the time of interpretation on 11/22/2023 at 6:52 pm to provider [facility] , who verbally acknowledged these results. Electronically Signed   By: Marin Roberts M.D.   On: 11/22/2023 18:52    Assessment and Plan: This is a 72 y.o. male with postoperative abdominal pain associated with nausea and vomiting.  - Discussed with the patient the findings on the CT scan.  I think the area seen in the right groin is residual insufflation.  There is no tracking of this air through the abdominal wall into the peritoneal cavity that would be more concerning for a bowel loop going through the hernia.  Discussed with patient that it would be very unusual for the peritoneal flap to break open and the mesh was sutured in place to move out of the way to allow the bowel to go through his right hernia defect.  Furthermore, on exam, this area does not fluctuate when he coughs or strains.  As such, I do not think there is any urgent or emergent surgical intervention needed at this point. - Discussed with patient that most likely he is having potentially  an  postoperative ileus likely from anesthesia and narcotic use.  For now, we will keep him n.p.o. since he has been having nausea and vomiting, while awaiting for return of bowel function.  Will continue IV fluid hydration and appropriate pain and nausea control. - Will continue following with you.  I spent 60 minutes dedicated to the care of this patient on the date of this encounter to include pre-visit review of records, face-to-face time with the patient discussing diagnosis and management, and any post-visit coordination of care.   Howie Ill, MD [facility] Pg:  [phone]

## 2023-11-22 NOTE — Assessment & Plan Note (Addendum)
-   This could be the culprit for recurrent nausea and vomiting. - We will follow serial lipase levels and keep the patient n.p.o. as mentioned above. - Pain management will be provided. - She has no current history of alcohol abuse. - Will obtain a right upper quadrant ultrasound to assess his gallbladder.

## 2023-11-22 NOTE — H&P (Signed)
[place]   PATIENT NAME: Jerry Pena    MR#:  [id number]  DATE OF BIRTH:  1951/08/11  DATE OF ADMISSION:  11/22/2023  PRIMARY CARE PHYSICIAN: Berniece Salines, FNP   Patient is coming from: Home  REQUESTING/REFERRING PHYSICIAN: Chesley Noon, MD  CHIEF COMPLAINT:   Chief Complaint  Patient presents with   Abdominal Pain    HISTORY OF PRESENT ILLNESS:  Jerry Pena is a 72 y.o. African-American male with medical history significant for prostate cancer status post prostatectomy, dyslipidemia, GERD, DDD, s/p laparoscopic right inguinal hernia repair by Dr. Dolores Frame here at [facility] yesterday he was been having significant pain since his surgery yesterday with associated nausea and vomiting.  He denies any hematemesis or coffee-ground emesis.  It has been occasionally greenish though.  He has not been able to keep anything down since his surgery.  He continues to pass flatus.  No dysuria or oliguria or hematuria or flank pain.  No cough or wheezing or dyspnea.  No chest pain or palpitations.  No fever but he has been having occasional chills.  ED Course: When the patient came to the ER, BP was 140/85 with otherwise normal vital signs.  Labs revealed unremarkable CMP.  Lipase levels elevated at 397.  CBC showed leukocytosis at 13.9 and urinalysis showed high specific gravity of 1046 and was otherwise unremarkable. EKG as reviewed by me : EKG showed normal sinus rhythm with rate of 87 Imaging: Abdominal and pelvic CT scan with contrast revealed the following: 1. Fluid and gas extending into the right inguinal canal. This may represent a loop of bowel although direct continuity is unclear. 2. Tiny locules of free air are likely within normal limits following surgery yesterday. 3. Moderate stool throughout the colon suggests constipation. 4. Mild dilation of the pancreatic duct without obstructing lesion. This is likely incidental. 5. Fusion of the SI joints bilaterally  compatible with ankylosing spondylitis. 6.  Aortic Atherosclerosis.  The patient was given 1 L bolus of IV lactated Ringer, 4 mg of IV morphine sulfate and 4 mg of IV Zofran as well as 1 mg of IV Dilaudid.  He will be admitted to a medical telemetry observation bed for further evaluation and management. PAST MEDICAL HISTORY:   Past Medical History:  Diagnosis Date   Beta thalassemia trait 09/27/2018   Cancer (HCC) 10/2017   Prostate   Cataract    Chronic constipation    Colon polyps    DDD (degenerative disc disease), lumbar 06/13/2017   Lumbar imaging June 2018   Elevated PSA 09/12/2017   Refer to urologist, Sept 2018   Facet hypertrophy of lumbar region 06/13/2017   Lumbar imaging June 2018   GERD (gastroesophageal reflux disease)    occ no meds   Hyperlipidemia    Intractable nausea and vomiting 11/22/2023   Radiation proctitis    Right inguinal hernia 10/2023    PAST SURGICAL HISTORY:   Past Surgical History:  Procedure Laterality Date   CATARACT EXTRACTION W/ INTRAOCULAR LENS IMPLANT Left 3/ 28/2014   eyes   CATARACT EXTRACTION W/ INTRAOCULAR LENS IMPLANT Right 04/23/2013   COLONOSCOPY WITH PROPOFOL N/A 07/29/2019   Procedure: COLONOSCOPY WITH PROPOFOL;  Surgeon: Toney Reil, MD;  Location: [facility] ENDOSCOPY;  Service: Gastroenterology;  Laterality: N/A;   CYSTOSCOPY W/ RETROGRADES Bilateral 02/21/2021   Procedure: CYSTOSCOPY WITH RETROGRADE PYELOGRAM;  Surgeon: Vanna Scotland, MD;  Location: [facility] ORS;  Service: Urology;  Laterality: Bilateral;   CYSTOSCOPY WITH  BIOPSY N/A 02/21/2021   Procedure: CYSTOSCOPY WITH BIOPSY and fulgeration;  Surgeon: Vanna Scotland, MD;  Location: [facility] ORS;  Service: Urology;  Laterality: N/A;   FLEXIBLE SIGMOIDOSCOPY N/A 11/14/2019   Procedure: FLEXIBLE SIGMOIDOSCOPY;  Surgeon: Toney Reil, MD;  Location: [facility] END[facility]COPY;  Service: Gastroenterology;  Laterality: N/A;   HERNIA REPAIR  11/21/2023   PELVIC LYMPH NODE  DISSECTION N/A 12/10/2017   Procedure: PELVIC LYMPH NODE DISSECTION;  Surgeon: Vanna Scotland, MD;  Location: [facility] ORS;  Service: Urology;  Laterality: N/A;   ROBOT ASSISTED LAPAROSCOPIC RADICAL PROSTATECTOMY N/A 12/10/2017   Procedure: ROBOTIC ASSISTED LAPAROSCOPIC RADICAL PROSTATECTOMY;  Surgeon: Vanna Scotland, MD;  Location: [facility] ORS;  Service: Urology;  Laterality: N/A;    SOCIAL HISTORY:   Social History   Tobacco Use   Smoking status: Former    Types: Cigars    Quit date: 11/19/2023   Smokeless tobacco: Never   Tobacco comments:    occasional cigar  Substance Use Topics   Alcohol use: Not Currently    Comment: beer rarely    FAMILY HISTORY:   Family History  Problem Relation Age of Onset   Stroke Mother    Stroke Father    Cancer Sister        breast, lung   Pulmonary Hypertension Sister    Cancer Son        prostate   Prostate cancer Son    Hypertension Sister    Bladder Cancer Neg Hx    Kidney cancer Neg Hx     DRUG ALLERGIES:   Allergies  Allergen Reactions   Aspirin Other (See Comments)    Upset stomach, indigestion and heart burn   Penicillins Hives and Other (See Comments)    Has patient had a PCN reaction causing immediate rash, facial/tongue/throat swelling, SOB or lightheadedness with hypotension: Yes Has patient had a PCN reaction causing severe rash involving mucus membranes or skin necrosis: No Has patient had a PCN reaction that required hospitalization: Yes Has patient had a PCN reaction occurring within the last 10 years: No If all of the above answers are "NO", then may proceed with Cephalosporin use.     REVIEW OF SYSTEMS:   ROS As per history of present illness. All pertinent systems were reviewed above. Constitutional, HEENT, cardiovascular, respiratory, GI, GU, musculoskeletal, neuro, psychiatric, endocrine, integumentary and hematologic systems were reviewed and are otherwise negative/unremarkable except for positive findings  mentioned above in the HPI.   MEDICATIONS AT HOME:   Prior to Admission medications   Medication Sig Start Date End Date Taking? Authorizing Provider  calcium-vitamin D (OSCAL WITH D) 500-200 MG-UNIT tablet Take 1 tablet by mouth daily.    [provider]  folic acid (FOLVITE) 400 MCG tablet Take 1 tablet (400 mcg total) by mouth daily. 10/04/18   Rickard Patience, MD  HYDROcodone-acetaminophen (NORCO/VICODIN) 5-325 MG tablet Take 1 tablet by mouth every 6 (six) hours as needed for moderate pain (pain score 4-6). 11/21/23   [name], MD  ibuprofen (ADVIL) 200 MG tablet Take 2 tablets (400 mg total) by mouth every 6 (six) hours as needed for headache or moderate pain (pain score 4-6). 11/21/23   [name], MD  Melatonin 5 MG TABS Take 10 mg by mouth daily as needed (Sleep).    [provider]  Multiple Vitamin (MULTIVITAMIN) tablet Take 1 tablet by mouth daily.    [provider]  polyethylene glycol (MIRALAX / GLYCOLAX) 17 g packet Take 17 g by mouth  daily.    [provider]  vitamin B-12 (CYANOCOBALAMIN) 100 MCG tablet Take 100 mcg by mouth daily.    [provider]  vitamin C (ASCORBIC ACID) 500 MG tablet Take 500 mg by mouth daily.    [provider]      VITAL SIGNS:  Blood pressure 128/71, pulse 87, temperature 99.4 F (37.4 C), temperature source Oral, resp. rate (!) 26, height 5\' 8"  (1.727 m), weight 62.6 kg, SpO2 95%.  PHYSICAL EXAMINATION:  Physical Exam  GENERAL:  72 y.o.-year-old African-American male patient lying in the bed with no acute distress.  EYES: Pupils equal, round, reactive to light and accommodation. No scleral icterus. Extraocular muscles intact.  HEENT: Head atraumatic, normocephalic. Oropharynx and nasopharynx clear.  NECK:  Supple, no jugular venous distention. No thyroid enlargement, no tenderness.  LUNGS: Normal breath sounds bilaterally, no wheezing, rales,rhonchi or crepitation. No use of  accessory muscles of respiration.  CARDIOVASCULAR: Regular rate and rhythm, S1, S2 normal. No murmurs, rubs, or gallops.  ABDOMEN: Soft, nondistended, with generalized abdominal tenderness mainly in the epigastric area without rebound tenderness guarding or rigidity.  Clean healed laparoscopic wounds. Bowel sounds present. No organomegaly or mass.  EXTREMITIES: No pedal edema, cyanosis, or clubbing.  NEUROLOGIC: Cranial nerves II through XII are intact. Muscle strength 5/5 in all extremities. Sensation intact. Gait not checked.  PSYCHIATRIC: The patient is alert and oriented x 3.  Normal affect and good eye contact. SKIN: No obvious rash, lesion, or ulcer.   LABORATORY PANEL:   CBC Recent Labs  Lab 11/22/23 1702  WBC 13.9*  HGB 13.2  HCT 42.3  PLT 373   ------------------------------------------------------------------------------------------------------------------  Chemistries  Recent Labs  Lab 11/22/23 1702  NA 137  K 4.6  CL 99  CO2 27  GLUCOSE 134*  BUN 16  CREATININE 1.00  CALCIUM 9.3  AST 30  ALT 13  ALKPHOS 85  BILITOT 1.0   ------------------------------------------------------------------------------------------------------------------  Cardiac Enzymes No results for input(s): "TROPONINI" in the last 168 hours. ------------------------------------------------------------------------------------------------------------------  RADIOLOGY:  CT ABDOMEN PELVIS W CONTRAST  Result Date: 11/22/2023 CLINICAL DATA:  Laparoscopic inguinal hernia repair yesterday. Severe pain vomiting. EXAM: CT ABDOMEN AND PELVIS WITH CONTRAST TECHNIQUE: Multidetector CT imaging of the abdomen and pelvis was performed using the standard protocol following bolus administration of intravenous contrast. RADIATION DOSE REDUCTION: This exam was performed according to the departmental dose-optimization program which includes automated exposure control, adjustment of the mA and/or kV according  to patient size and/or use of iterative reconstruction technique. CONTRAST:  OMNIPAQUE IOHEXOL 300 MG/ML  SOLN COMPARISON:  CT of the abdomen and pelvis without contrast 01/14/2021 FINDINGS: Lower chest: The lung bases are clear without focal nodule, mass, or airspace disease. Heart size is normal. No significant pleural or pericardial effusion is present. Hepatobiliary: Choose 1 Pancreas: Mild dilation of pancreatic duct is present. No obstructing lesion is present. The pancreas is otherwise within normal limits. Spleen: Normal in size without focal abnormality. Adrenals/Urinary Tract: Adrenal glands are unremarkable. Kidneys are normal, without renal calculi, focal lesion, or hydronephrosis. Bladder is unremarkable. Stomach/Bowel: The stomach and duodenum are within normal limits. The small bowel is within normal limits proximally. Fluid and gas extending into the right inguinal canal. This may represent a loop of bowel although direct continuity is unclear. Moderate stool is present throughout the colon. Tiny locules of free air are likely within normal limits following surgery yesterday. Vascular/Lymphatic: Atherosclerotic calcifications are present in the aorta and branch vessels. No aneurysm  is present. Reproductive: Prostate is unremarkable.  Field Other: No other significant ventral hernia is present. Musculoskeletal: Vertebral body heights are normal. Straightening of the normal lumbar lordosis is present. Mild rightward curvature is present the upper lumbar spine. Endplate degenerative changes are most evident at L5-S1. The SI joints are fused bilaterally. The hips are located and within normal limits bilaterally. IMPRESSION: 1. Fluid and gas extending into the right inguinal canal. This may represent a loop of bowel although direct continuity is unclear. 2. Tiny locules of free air are likely within normal limits following surgery yesterday. 3. Moderate stool throughout the colon suggests  constipation. 4. Mild dilation of the pancreatic duct without obstructing lesion. This is likely incidental. 5. Fusion of the SI joints bilaterally compatible with ankylosing spondylitis. 6.  Aortic Atherosclerosis (ICD10-I70.0). These results were called by telephone at the time of interpretation on 11/22/2023 at 6:52 pm to provider [facility] , who verbally acknowledged these results. Electronically Signed   By: Marin Roberts M.D.   On: 11/22/2023 18:52      IMPRESSION AND PLAN:  Assessment and Plan: * Intractable nausea and vomiting - This could be related to acute pancreatitis given elevated lipase level. - The patient be admitted to observation medical telemetry bed. - We will keep him n.p.o. except for medications with sips. - We will follow serum lipase levels. - Antiemetics will be provided. - Pain management will be provided. - Given recent laparoscopic right inguinal hernia repair, general surgery consult will be obtained. - Dr. Aleen Campi was notified about the patient.  Idiopathic acute pancreatitis - This could be the culprit for recurrent nausea and vomiting. - We will follow serial lipase levels and keep the patient n.p.o. as mentioned above. - Pain management will be provided. - She has no current history of alcohol abuse. - Will obtain a right upper quadrant ultrasound to assess his gallbladder.  Leukocytosis - This could be stress demargination and possibly associated with acute pancreatitis. - We will follow CBC with above management.  Dyslipidemia - This is apparently diet managed.   DVT prophylaxis: Lovenox.  Advanced Care Planning:  Code Status: full code.  Family Communication:  The plan of care was discussed in details with the patient (and family). I answered all questions. The patient agreed to proceed with the above mentioned plan. Further management will depend upon hospital course. Disposition Plan: Back to previous home environment Consults  called: General Surgery All the records are reviewed and case discussed with ED provider.  Status is: Observation  I certify that at the time of admission, it is my clinical judgment that the patient will require inpatient hospital care extending more than 2 midnights.                            Dispo: The patient is from: Home              Anticipated d/c is to: Home              Patient currently is not medically stable to d/c.              Difficult to place patient: No  Hannah Beat M.D on 11/22/2023 at 11:02 PM  [facility]   From 7 PM-7 AM, contact night-coverage [email or web address]  CC: Primary care physician; Berniece Salines, FNP

## 2023-11-22 NOTE — Assessment & Plan Note (Signed)
-  This is apparently diet managed.

## 2023-11-22 NOTE — ED Notes (Signed)
EDP at bedside  

## 2023-11-22 NOTE — ED Triage Notes (Signed)
Patient had Hernia surgery to the lower bowel. Laparoscopic. Pain is extreme since the pain medicaiton has worn off. Last pain medication dose was at 03:30PM 11/22/23. NORCO, Ibuprofen, Tylenol prescribed. EMS reports that pt hasnt been able to keep anything down today. No bowel movement since yesterday. 96% RA, 151 CBG are EMS pre-vitals. Patient prefers different positions to defer the pain. Patient says the pain is getting worse and worse.

## 2023-11-22 NOTE — Assessment & Plan Note (Signed)
-   This could be related to acute pancreatitis given elevated lipase level. - The patient be admitted to observation medical telemetry bed. - We will keep him n.p.o. except for medications with sips. - We will follow serum lipase levels. - Antiemetics will be provided. - Pain management will be provided. - Given recent laparoscopic right inguinal hernia repair, general surgery consult will be obtained. - Dr. Aleen Campi was notified about the patient.

## 2023-11-23 DIAGNOSIS — E785 Hyperlipidemia, unspecified: Secondary | ICD-10-CM | POA: Diagnosis present

## 2023-11-23 DIAGNOSIS — Z961 Presence of intraocular lens: Secondary | ICD-10-CM | POA: Diagnosis present

## 2023-11-23 DIAGNOSIS — Z880 Allergy status to penicillin: Secondary | ICD-10-CM | POA: Diagnosis not present

## 2023-11-23 DIAGNOSIS — K219 Gastro-esophageal reflux disease without esophagitis: Secondary | ICD-10-CM | POA: Diagnosis present

## 2023-11-23 DIAGNOSIS — Z823 Family history of stroke: Secondary | ICD-10-CM | POA: Diagnosis not present

## 2023-11-23 DIAGNOSIS — R140 Abdominal distension (gaseous): Secondary | ICD-10-CM | POA: Diagnosis not present

## 2023-11-23 DIAGNOSIS — Z79899 Other long term (current) drug therapy: Secondary | ICD-10-CM | POA: Diagnosis not present

## 2023-11-23 DIAGNOSIS — Z8546 Personal history of malignant neoplasm of prostate: Secondary | ICD-10-CM | POA: Diagnosis not present

## 2023-11-23 DIAGNOSIS — Z8042 Family history of malignant neoplasm of prostate: Secondary | ICD-10-CM | POA: Diagnosis not present

## 2023-11-23 DIAGNOSIS — Z9079 Acquired absence of other genital organ(s): Secondary | ICD-10-CM | POA: Diagnosis not present

## 2023-11-23 DIAGNOSIS — K4090 Unilateral inguinal hernia, without obstruction or gangrene, not specified as recurrent: Secondary | ICD-10-CM | POA: Diagnosis present

## 2023-11-23 DIAGNOSIS — F17290 Nicotine dependence, other tobacco product, uncomplicated: Secondary | ICD-10-CM | POA: Diagnosis present

## 2023-11-23 DIAGNOSIS — Z9841 Cataract extraction status, right eye: Secondary | ICD-10-CM | POA: Diagnosis not present

## 2023-11-23 DIAGNOSIS — Z886 Allergy status to analgesic agent status: Secondary | ICD-10-CM | POA: Diagnosis not present

## 2023-11-23 DIAGNOSIS — K9189 Other postprocedural complications and disorders of digestive system: Secondary | ICD-10-CM | POA: Diagnosis present

## 2023-11-23 DIAGNOSIS — Z9842 Cataract extraction status, left eye: Secondary | ICD-10-CM | POA: Diagnosis not present

## 2023-11-23 DIAGNOSIS — K567 Ileus, unspecified: Secondary | ICD-10-CM | POA: Diagnosis present

## 2023-11-23 DIAGNOSIS — Y838 Other surgical procedures as the cause of abnormal reaction of the patient, or of later complication, without mention of misadventure at the time of the procedure: Secondary | ICD-10-CM | POA: Diagnosis present

## 2023-11-23 DIAGNOSIS — Z8249 Family history of ischemic heart disease and other diseases of the circulatory system: Secondary | ICD-10-CM | POA: Diagnosis not present

## 2023-11-23 DIAGNOSIS — R112 Nausea with vomiting, unspecified: Secondary | ICD-10-CM | POA: Diagnosis present

## 2023-11-23 LAB — BASIC METABOLIC PANEL
Anion gap: 12 (ref 5–15)
BUN: 14 mg/dL (ref 8–23)
CO2: 26 mmol/L (ref 22–32)
Calcium: 9 mg/dL (ref 8.9–10.3)
Chloride: 100 mmol/L (ref 98–111)
Creatinine, Ser: 0.96 mg/dL (ref 0.61–1.24)
GFR, Estimated: >60 mL/min (ref >60)
Glucose, Bld: 96 mg/dL (ref 70–99)
Potassium: 4 mmol/L (ref 3.5–5.1)
Sodium: 138 mmol/L (ref 135–145)

## 2023-11-23 LAB — CBC
HCT: 35.2 % — ABNORMAL LOW (ref 39.0–52.0)
Hemoglobin: 11.2 g/dL — ABNORMAL LOW (ref 13.0–17.0)
MCH: 22.3 pg — ABNORMAL LOW (ref 26.0–34.0)
MCHC: 31.8 g/dL (ref 30.0–36.0)
MCV: 70 fL — ABNORMAL LOW (ref 80.0–100.0)
Platelets: 326 10*3/uL (ref 150–400)
RBC: 5.03 MIL/uL (ref 4.22–5.81)
RDW: 16.4 % — ABNORMAL HIGH (ref 11.5–15.5)
WBC: 14.5 10*3/uL — ABNORMAL HIGH (ref 4.0–10.5)
nRBC: 0 % (ref 0.0–0.2)

## 2023-11-23 LAB — LIPASE, BLOOD: Lipase: 143 U/L — ABNORMAL HIGH (ref 11–51)

## 2023-11-23 MED ORDER — SODIUM CHLORIDE 0.9 % IV SOLN
12.5000 mg | Freq: Four times a day (QID) | INTRAVENOUS | Status: DC | PRN
  Administered 2023-11-23 – 2023-11-24 (×2): 12.5 mg via INTRAVENOUS
  Filled 2023-11-23: qty 0.5
  Filled 2023-11-23: qty 12.5

## 2023-11-23 NOTE — Plan of Care (Signed)

## 2023-11-23 NOTE — Progress Notes (Signed)
PROGRESS NOTE    Jerry Pena  EPP:[id number] DOB: [birth year] DOA: 11/22/2023 PCP: Berniece Salines, FNP    Brief Narrative:  72 y.o. African-American male with medical history significant for prostate cancer status post prostatectomy, dyslipidemia, GERD, DDD, s/p laparoscopic right inguinal hernia repair by Dr. Dolores Frame here at [facility] yesterday he was been having significant pain since his surgery yesterday with associated nausea and vomiting.  He denies any hematemesis or coffee-ground emesis.  It has been occasionally greenish though.  He has not been able to keep anything down since his surgery.  He continues to pass flatus.  No dysuria or oliguria or hematuria or flank pain.  No cough or wheezing or dyspnea.  No chest pain or palpitations.  No fever but he has been having occasional chills.    Assessment & Plan:   Principal Problem:   Intractable nausea and vomiting Active Problems:   Idiopathic acute pancreatitis   Leukocytosis   Dyslipidemia  * Intractable nausea and vomiting Patient seen in consultation by general surgery.  Discussed findings of CT scan.  Working diagnosis is air seen in right groin on CAT scan and secondary to residual insufflation and possibly a postoperative ileus from anesthesia and narcotic use. Plan: Maintain n.p.o. IVF Pain control Antiemetics Surgical follow-up  Pancreatitis, ruled out Suspect patient's symptoms are more secondary to recent surgical intervention and postoperative ileus.  Low suspicion for acute pancreatitis.  CT also reassuring. Plan: Conservative management as above  Leukocytosis Suspect stress reaction.  Low suspicion for infectious etiology at this time.  Monitor off antibiotics.  Low threshold to initiate should any suspicion for infectious etiology arise   Dyslipidemia Managed by diet    DVT prophylaxis: SQ Lovenox Code Status: Full Family Communication:None today Disposition Plan: Status is: Inpatient Remains  inpatient appropriate because: Intractable nausea and vomiting   Level of care: Med-Surg  Consultants:  General surgery  Procedures:  None  Antimicrobials: None   Subjective: Seen and examined.  Sitting up in chair.  Endorses abdominal pain.  Objective: Vitals:   11/23/23 1100 11/23/23 1130 11/23/23 1200 11/23/23 1345  BP: 131/74 (!) 140/78 (!) 145/84   Pulse: 84 80 78   Resp:      Temp:    98.9 F (37.2 C)  TempSrc:    Oral  SpO2: 95% 97% 95%   Weight:      Height:        Intake/Output Summary (Last 24 hours) at 11/23/2023 1346 Last data filed at 11/23/2023 1224 Gross per 24 hour  Intake 1000 ml  Output 525 ml  Net 475 ml   Filed Weights   11/22/23 1648  Weight: 62.6 kg    Examination:  General exam: Mild distress due to pain Respiratory system: Clear to auscultation. Respiratory effort normal. Cardiovascular system: 1 S2, RRR, no murmurs, no pedal edema Gastrointestinal system: Soft, nondistended, TTP periumbilical area and right groin Central nervous system: Alert and oriented. No focal neurological deficits. Extremities: Symmetric 5 x 5 power. Skin: No rashes, lesions or ulcers Psychiatry: Judgement and insight appear normal. Mood & affect appropriate.     Data Reviewed: I have personally reviewed following labs and imaging studies  CBC: Recent Labs  Lab 11/22/23 1702 11/23/23 0614  WBC 13.9* 14.5*  HGB 13.2 11.2*  HCT 42.3 35.2*  MCV 72.1* 70.0*  PLT 373 326   Basic Metabolic Panel: Recent Labs  Lab 11/22/23 1702 11/23/23 0614  NA 137 138  K 4.6 4.0  CL  99 100  CO2 27 26  GLUCOSE 134* 96  BUN 16 14  CREATININE 1.00 0.96  CALCIUM 9.3 9.0   GFR: Estimated Creatinine Clearance: 60.7 mL/min (by C-G formula based on SCr of 0.96 mg/dL). Liver Function Tests: Recent Labs  Lab 11/22/23 1702  AST 30  ALT 13  ALKPHOS 85  BILITOT 1.0  PROT 8.1  ALBUMIN 4.4   Recent Labs  Lab 11/22/23 1702 11/23/23 0614  LIPASE 397* 143*    No results for input(s): "AMMONIA" in the last 168 hours. Coagulation Profile: No results for input(s): "INR", "PROTIME" in the last 168 hours. Cardiac Enzymes: No results for input(s): "CKTOTAL", "CKMB", "CKMBINDEX", "TROPONINI" in the last 168 hours. BNP (last 3 results) No results for input(s): "PROBNP" in the last 8760 hours. HbA1C: No results for input(s): "HGBA1C" in the last 72 hours. CBG: No results for input(s): "GLUCAP" in the last 168 hours. Lipid Profile: No results for input(s): "CHOL", "HDL", "LDLCALC", "TRIG", "CHOLHDL", "LDLDIRECT" in the last 72 hours. Thyroid Function Tests: No results for input(s): "TSH", "T4TOTAL", "FREET4", "T3FREE", "THYROIDAB" in the last 72 hours. Anemia Panel: No results for input(s): "VITAMINB12", "FOLATE", "FERRITIN", "TIBC", "IRON", "RETICCTPCT" in the last 72 hours. Sepsis Labs: No results for input(s): "PROCALCITON", "LATICACIDVEN" in the last 168 hours.  No results found for this or any previous visit (from the past 240 hour(s)).       Radiology Studies: US Abdomen Limited RUQ (LIVER/GB)  Result Date: 11/22/2023 CLINICAL DATA:  Acute pancreatitis inguinal hernia repair EXAM: ULTRASOUND ABDOMEN LIMITED RIGHT UPPER QUADRANT COMPARISON:  CT 11/22/2023 FINDINGS: Gallbladder: No gallstones or wall thickening visualized. No sonographic Murphy sign noted by sonographer. Common bile duct: Diameter: 5 mm Liver: No focal lesion identified. Within normal limits in parenchymal echogenicity. Portal vein is patent on color Doppler imaging with normal direction of blood flow towards the liver. Other: None. IMPRESSION: Negative examination. Electronically Signed   By: Jasmine Pang M.D.   On: 11/22/2023 23:48   CT ABDOMEN PELVIS W CONTRAST  Result Date: 11/22/2023 CLINICAL DATA:  Laparoscopic inguinal hernia repair yesterday. Severe pain vomiting. EXAM: CT ABDOMEN AND PELVIS WITH CONTRAST TECHNIQUE: Multidetector CT imaging of the abdomen and  pelvis was performed using the standard protocol following bolus administration of intravenous contrast. RADIATION DOSE REDUCTION: This exam was performed according to the departmental dose-optimization program which includes automated exposure control, adjustment of the mA and/or kV according to patient size and/or use of iterative reconstruction technique. CONTRAST:  OMNIPAQUE IOHEXOL 300 MG/ML  SOLN COMPARISON:  CT of the abdomen and pelvis without contrast 01/14/2021 FINDINGS: Lower chest: The lung bases are clear without focal nodule, mass, or airspace disease. Heart size is normal. No significant pleural or pericardial effusion is present. Hepatobiliary: Choose 1 Pancreas: Mild dilation of pancreatic duct is present. No obstructing lesion is present. The pancreas is otherwise within normal limits. Spleen: Normal in size without focal abnormality. Adrenals/Urinary Tract: Adrenal glands are unremarkable. Kidneys are normal, without renal calculi, focal lesion, or hydronephrosis. Bladder is unremarkable. Stomach/Bowel: The stomach and duodenum are within normal limits. The small bowel is within normal limits proximally. Fluid and gas extending into the right inguinal canal. This may represent a loop of bowel although direct continuity is unclear. Moderate stool is present throughout the colon. Tiny locules of free air are likely within normal limits following surgery yesterday. Vascular/Lymphatic: Atherosclerotic calcifications are present in the aorta and branch vessels. No aneurysm is present. Reproductive: Prostate is unremarkable.  Field  Other: No other significant ventral hernia is present. Musculoskeletal: Vertebral body heights are normal. Straightening of the normal lumbar lordosis is present. Mild rightward curvature is present the upper lumbar spine. Endplate degenerative changes are most evident at L5-S1. The SI joints are fused bilaterally. The hips are located and within normal limits  bilaterally. IMPRESSION: 1. Fluid and gas extending into the right inguinal canal. This may represent a loop of bowel although direct continuity is unclear. 2. Tiny locules of free air are likely within normal limits following surgery yesterday. 3. Moderate stool throughout the colon suggests constipation. 4. Mild dilation of the pancreatic duct without obstructing lesion. This is likely incidental. 5. Fusion of the SI joints bilaterally compatible with ankylosing spondylitis. 6.  Aortic Atherosclerosis (ICD10-I70.0). These results were called by telephone at the time of interpretation on 11/22/2023 at 6:52 pm to provider [facility] , who verbally acknowledged these results. Electronically Signed   By: Marin Roberts M.D.   On: 11/22/2023 18:52        Scheduled Meds:  calcium-vitamin D  1 tablet Oral Daily   enoxaparin (LOVENOX) injection  40 mg Subcutaneous Q24H   folic acid  0.5 mg Oral Daily   multivitamin with minerals  1 tablet Oral Daily   polyethylene glycol  17 g Oral Daily   vitamin B-12  100 mcg Oral Daily   Continuous Infusions:  sodium chloride 75 mL/hr at 11/23/23 1225   promethazine (PHENERGAN) injection (IM or IVPB)       LOS: 0 days     Jerry Moore, MD [facility]   If 7PM-7AM, please contact night-coverage  11/23/2023, 1:46 PM

## 2023-11-23 NOTE — ED Notes (Addendum)
C/o pain, worse after MD palpated abd. Denies other sx.

## 2023-11-23 NOTE — ED Notes (Signed)
Admitting MD at BS.  

## 2023-11-23 NOTE — ED Notes (Signed)
ED TO INPATIENT HANDOFF REPORT  ED Nurse Name and Phone #: Al Corpus, RN [phone]  S Name/Age/Gender Jerry Pena 72 y.o. male Room/Bed: ED37A/ED37A  Code Status   Code Status: Full Code  Home/SNF/Other Home Patient oriented to: self, place, time, and situation Is this baseline? Yes   Triage Complete: Triage complete  Chief Complaint Intractable nausea and vomiting [R11.2]  Triage Note Patient had Hernia surgery to the lower bowel. Laparoscopic. Pain is extreme since the pain medicaiton has worn off. Last pain medication dose was at 03:30PM 11/22/23. NORCO, Ibuprofen, Tylenol prescribed. EMS reports that pt hasnt been able to keep anything down today. No bowel movement since yesterday. 96% RA, 151 CBG are EMS pre-vitals. Patient prefers different positions to defer the pain. Patient says the pain is getting worse and worse.      Allergies Allergies  Allergen Reactions   Aspirin Other (See Comments)    Upset stomach, indigestion and heart burn   Penicillins Hives and Other (See Comments)    Has patient had a PCN reaction causing immediate rash, facial/tongue/throat swelling, SOB or lightheadedness with hypotension: Yes Has patient had a PCN reaction causing severe rash involving mucus membranes or skin necrosis: No Has patient had a PCN reaction that required hospitalization: Yes Has patient had a PCN reaction occurring within the last 10 years: No If all of the above answers are "NO", then may proceed with Cephalosporin use.     Level of Care/Admitting Diagnosis ED Disposition     ED Disposition  Admit   Condition  --   Comment  Hospital Area: [facility] [[id number]]  Level of Care: Med-Surg [16]  Covid Evaluation: Asymptomatic - no recent exposure (last 10 days) testing not required  Diagnosis: Intractable nausea and vomiting [720114]  Admitting Physician: Tresa Moore [[id number]]  Attending Physician: Tresa Moore [[id number]]   Certification:: I certify this patient will need inpatient services for at least 2 midnights  Expected Medical Readiness: 11/24/2023          B Medical/Surgery History Past Medical History:  Diagnosis Date   Beta thalassemia trait 09/27/2018   Cancer (HCC) 10/2017   Prostate   Cataract    Chronic constipation    Colon polyps    DDD (degenerative disc disease), lumbar 06/13/2017   Lumbar imaging June 2018   Elevated PSA 09/12/2017   Refer to urologist, Sept 2018   Facet hypertrophy of lumbar region 06/13/2017   Lumbar imaging June 2018   GERD (gastroesophageal reflux disease)    occ no meds   Hyperlipidemia    Intractable nausea and vomiting 11/22/2023   Radiation proctitis    Right inguinal hernia 10/2023   Past Surgical History:  Procedure Laterality Date   CATARACT EXTRACTION W/ INTRAOCULAR LENS IMPLANT Left 3/ 28/2014   eyes   CATARACT EXTRACTION W/ INTRAOCULAR LENS IMPLANT Right 04/23/2013   COLONOSCOPY WITH PROPOFOL N/A 07/29/2019   Procedure: COLONOSCOPY WITH PROPOFOL;  Surgeon: Toney Reil, MD;  Location: [facility] ENDOSCOPY;  Service: Gastroenterology;  Laterality: N/A;   CYSTOSCOPY W/ RETROGRADES Bilateral 02/21/2021   Procedure: CYSTOSCOPY WITH RETROGRADE PYELOGRAM;  Surgeon: Vanna Scotland, MD;  Location: [facility] ORS;  Service: Urology;  Laterality: Bilateral;   CYSTOSCOPY WITH BIOPSY N/A 02/21/2021   Procedure: CYSTOSCOPY WITH BIOPSY and fulgeration;  Surgeon: Vanna Scotland, MD;  Location: [facility] ORS;  Service: Urology;  Laterality: N/A;   FLEXIBLE SIGMOIDOSCOPY N/A 11/14/2019   Procedure: FLEXIBLE SIGMOIDOSCOPY;  Surgeon: Toney Reil, MD;  Location: [facility] ENDOSCOPY;  Service: Gastroenterology;  Laterality: N/A;   HERNIA REPAIR  11/21/2023   PELVIC LYMPH NODE DISSECTION N/A 12/10/2017   Procedure: PELVIC LYMPH NODE DISSECTION;  Surgeon: Vanna Scotland, MD;  Location: [facility] ORS;  Service: Urology;  Laterality: N/A;   ROBOT ASSISTED LAPAROSCOPIC  RADICAL PROSTATECTOMY N/A 12/10/2017   Procedure: ROBOTIC ASSISTED LAPAROSCOPIC RADICAL PROSTATECTOMY;  Surgeon: Vanna Scotland, MD;  Location: [facility] ORS;  Service: Urology;  Laterality: N/A;     A IV Location/Drains/Wounds Patient Lines/Drains/Airways Status     Active Line/Drains/Airways     Name Placement date Placement time Site Days   Peripheral IV 11/22/23 20 G 1" Left;Posterior Forearm 11/22/23  1712  Forearm  1   Incision - 3 Ports Abdomen Right Mid Left 11/21/23  3664  -- 2            Intake/Output Last 24 hours  Intake/Output Summary (Last 24 hours) at 11/23/2023 1528 Last data filed at 11/23/2023 1224 Gross per 24 hour  Intake 1000 ml  Output 525 ml  Net 475 ml    Labs/Imaging Results for orders placed or performed during the hospital encounter of 11/22/23 (from the past 48 hour(s))  Lipase, blood     Status: Abnormal   Collection Time: 11/22/23  5:02 PM  Result Value Ref Range   Lipase 397 (H) 11 - 51 U/L    Comment: RESULTS CONFIRMED BY MANUAL DILUTION MJU Performed at [facility], [address], [place], [place] [address]   Comprehensive metabolic panel     Status: Abnormal   Collection Time: 11/22/23  5:02 PM  Result Value Ref Range   Sodium 137 135 - 145 mmol/L   Potassium 4.6 3.5 - 5.1 mmol/L   Chloride 99 98 - 111 mmol/L   CO2 27 22 - 32 mmol/L   Glucose, Bld 134 (H) 70 - 99 mg/dL    Comment: Glucose reference range applies only to samples taken after fasting for at least 8 hours.   BUN 16 8 - 23 mg/dL   Creatinine, Ser 4.25 0.61 - 1.24 mg/dL   Calcium 9.3 8.9 - 95.6 mg/dL   Total Protein 8.1 6.5 - 8.1 g/dL   Albumin 4.4 3.5 - 5.0 g/dL   AST 30 15 - 41 U/L   ALT 13 0 - 44 U/L   Alkaline Phosphatase 85 38 - 126 U/L   Total Bilirubin 1.0 <1.2 mg/dL   GFR, Estimated >38 >75 mL/min    Comment: (NOTE) Calculated using the CKD-EPI Creatinine Equation (2021)    Anion gap 11 5 - 15    Comment: Performed at [facility],  [address] [place], [place] [address]  CBC     Status: Abnormal   Collection Time: 11/22/23  5:02 PM  Result Value Ref Range   WBC 13.9 (H) 4.0 - 10.5 K/uL   RBC 5.87 (H) 4.22 - 5.81 MIL/uL   Hemoglobin 13.2 13.0 - 17.0 g/dL   HCT 95.1 88.4 - 16.6 %   MCV 72.1 (L) 80.0 - 100.0 fL   MCH 22.5 (L) 26.0 - 34.0 pg   MCHC 31.2 30.0 - 36.0 g/dL   RDW 06.3 (H) 01.6 - 01.0 %   Platelets 373 150 - 400 K/uL   nRBC 0.0 0.0 - 0.2 %    Comment: Performed at [facility], [address] [place], [place] [address]  Urinalysis, Routine w reflex microscopic -Urine, Clean Catch     Status: Abnormal  Collection Time: 11/22/23  6:54 PM  Result Value Ref Range   Color, Urine YELLOW (A) YELLOW   APPearance CLEAR (A) CLEAR   Specific Gravity, Urine >1.046 (H) 1.005 - 1.030   pH 6.0 5.0 - 8.0   Glucose, UA NEGATIVE NEGATIVE mg/dL   Hgb urine dipstick NEGATIVE NEGATIVE   Bilirubin Urine NEGATIVE NEGATIVE   Ketones, ur NEGATIVE NEGATIVE mg/dL   Protein, ur NEGATIVE NEGATIVE mg/dL   Nitrite NEGATIVE NEGATIVE   Leukocytes,Ua NEGATIVE NEGATIVE    Comment: Performed at [facility], [address] [place], [place] [address]  Basic metabolic panel     Status: None   Collection Time: 11/23/23  6:14 AM  Result Value Ref Range   Sodium 138 135 - 145 mmol/L   Potassium 4.0 3.5 - 5.1 mmol/L   Chloride 100 98 - 111 mmol/L   CO2 26 22 - 32 mmol/L   Glucose, Bld 96 70 - 99 mg/dL    Comment: Glucose reference range applies only to samples taken after fasting for at least 8 hours.   BUN 14 8 - 23 mg/dL   Creatinine, Ser 1.91 0.61 - 1.24 mg/dL   Calcium 9.0 8.9 - 47.8 mg/dL   GFR, Estimated >29 >56 mL/min    Comment: (NOTE) Calculated using the CKD-EPI Creatinine Equation (2021)    Anion gap 12 5 - 15    Comment: Performed at [facility], [address] [place], [place] [address]  CBC     Status: Abnormal   Collection Time: 11/23/23  6:14 AM  Result Value Ref Range    WBC 14.5 (H) 4.0 - 10.5 K/uL   RBC 5.03 4.22 - 5.81 MIL/uL   Hemoglobin 11.2 (L) 13.0 - 17.0 g/dL   HCT 65.7 (L) 84.6 - 96.2 %   MCV 70.0 (L) 80.0 - 100.0 fL   MCH 22.3 (L) 26.0 - 34.0 pg   MCHC 31.8 30.0 - 36.0 g/dL   RDW 95.2 (H) 84.1 - 32.4 %   Platelets 326 150 - 400 K/uL   nRBC 0.0 0.0 - 0.2 %    Comment: Performed at [facility], [address] [place], [place] [address]  Lipase, blood     Status: Abnormal   Collection Time: 11/23/23  6:14 AM  Result Value Ref Range   Lipase 143 (H) 11 - 51 U/L    Comment: Performed at [facility], [address] [place], [place] [address]   US Abdomen Limited RUQ (LIVER/GB)  Result Date: 11/22/2023 CLINICAL DATA:  Acute pancreatitis inguinal hernia repair EXAM: ULTRASOUND ABDOMEN LIMITED RIGHT UPPER QUADRANT COMPARISON:  CT 11/22/2023 FINDINGS: Gallbladder: No gallstones or wall thickening visualized. No sonographic Murphy sign noted by sonographer. Common bile duct: Diameter: 5 mm Liver: No focal lesion identified. Within normal limits in parenchymal echogenicity. Portal vein is patent on color Doppler imaging with normal direction of blood flow towards the liver. Other: None. IMPRESSION: Negative examination. Electronically Signed   By: Jasmine Pang M.D.   On: 11/22/2023 23:48   CT ABDOMEN PELVIS W CONTRAST  Result Date: 11/22/2023 CLINICAL DATA:  Laparoscopic inguinal hernia repair yesterday. Severe pain vomiting. EXAM: CT ABDOMEN AND PELVIS WITH CONTRAST TECHNIQUE: Multidetector CT imaging of the abdomen and pelvis was performed using the standard protocol following bolus administration of intravenous contrast. RADIATION DOSE REDUCTION: This exam was performed according to the departmental dose-optimization program which includes automated exposure control, adjustment of the mA and/or kV according to patient size  and/or use of iterative reconstruction technique. CONTRAST:  OMNIPAQUE IOHEXOL 300 MG/ML  SOLN  COMPARISON:  CT of the abdomen and pelvis without contrast 01/14/2021 FINDINGS: Lower chest: The lung bases are clear without focal nodule, mass, or airspace disease. Heart size is normal. No significant pleural or pericardial effusion is present. Hepatobiliary: Choose 1 Pancreas: Mild dilation of pancreatic duct is present. No obstructing lesion is present. The pancreas is otherwise within normal limits. Spleen: Normal in size without focal abnormality. Adrenals/Urinary Tract: Adrenal glands are unremarkable. Kidneys are normal, without renal calculi, focal lesion, or hydronephrosis. Bladder is unremarkable. Stomach/Bowel: The stomach and duodenum are within normal limits. The small bowel is within normal limits proximally. Fluid and gas extending into the right inguinal canal. This may represent a loop of bowel although direct continuity is unclear. Moderate stool is present throughout the colon. Tiny locules of free air are likely within normal limits following surgery yesterday. Vascular/Lymphatic: Atherosclerotic calcifications are present in the aorta and branch vessels. No aneurysm is present. Reproductive: Prostate is unremarkable.  Field Other: No other significant ventral hernia is present. Musculoskeletal: Vertebral body heights are normal. Straightening of the normal lumbar lordosis is present. Mild rightward curvature is present the upper lumbar spine. Endplate degenerative changes are most evident at L5-S1. The SI joints are fused bilaterally. The hips are located and within normal limits bilaterally. IMPRESSION: 1. Fluid and gas extending into the right inguinal canal. This may represent a loop of bowel although direct continuity is unclear. 2. Tiny locules of free air are likely within normal limits following surgery yesterday. 3. Moderate stool throughout the colon suggests constipation. 4. Mild dilation of the pancreatic duct without obstructing lesion. This is likely incidental. 5. Fusion of the  SI joints bilaterally compatible with ankylosing spondylitis. 6.  Aortic Atherosclerosis (ICD10-I70.0). These results were called by telephone at the time of interpretation on 11/22/2023 at 6:52 pm to provider [facility] , who verbally acknowledged these results. Electronically Signed   By: Marin Roberts M.D.   On: 11/22/2023 18:52    Pending Labs Unresulted Labs (From admission, onward)    None       Vitals/Pain Today's Vitals   11/23/23 1400 11/23/23 1430 11/23/23 1500 11/23/23 1514  BP: (!) 155/84 137/73 139/88   Pulse: 73 82 74   Resp:   18   Temp:      TempSrc:      SpO2: 96% 93% 94%   Weight:      Height:      PainSc:    10-Worst pain ever    Isolation Precautions No active isolations  Medications Medications  HYDROcodone-acetaminophen (NORCO/VICODIN) 5-325 MG per tablet 1 tablet (1 tablet Oral Given 11/23/23 1224)  polyethylene glycol (MIRALAX / GLYCOLAX) packet 17 g (17 g Oral Not Given 11/23/23 1056)  vitamin B-12 (CYANOCOBALAMIN) tablet 100 mcg (100 mcg Oral Given 11/23/23 1058)  melatonin tablet 10 mg (10 mg Oral Given 11/22/23 2200)  calcium-vitamin D (OSCAL WITH D) 500-5 MG-MCG per tablet 1 tablet (1 tablet Oral Given 11/23/23 1058)  multivitamin with minerals tablet 1 tablet (1 tablet Oral Given 11/23/23 1058)  enoxaparin (LOVENOX) injection 40 mg (40 mg Subcutaneous Given 11/22/23 2200)  0.9 %  sodium chloride infusion ( Intravenous New Bag/Given 11/23/23 1225)  acetaminophen (TYLENOL) tablet 650 mg (has no administration in time range)    Or  acetaminophen (TYLENOL) suppository 650 mg (has no administration in time range)  traZODone (DESYREL) tablet 25 mg (25 mg  Oral Given 11/22/23 2200)  magnesium hydroxide (MILK OF MAGNESIA) suspension 30 mL (has no administration in time range)  folic acid (FOLVITE) tablet 0.5 mg (0.5 mg Oral Given 11/23/23 1058)  morphine (PF) 2 MG/ML injection 2 mg (2 mg Intravenous Given 11/23/23 1515)  promethazine  (PHENERGAN) 12.5 mg in sodium chloride 0.9 % 50 mL IVPB (has no administration in time range)  morphine (PF) 4 MG/ML injection 4 mg (4 mg Intravenous Given 11/22/23 1723)  ondansetron (ZOFRAN) injection 4 mg (4 mg Intravenous Given 11/22/23 1722)  lactated ringers bolus 1,000 mL (0 mLs Intravenous Stopped 11/23/23 0000)  iohexol (OMNIPAQUE) 300 MG/ML solution 100 mL (100 mLs Intravenous Contrast Given 11/22/23 1827)  HYDROmorphone (DILAUDID) injection 0.5 mg (0.5 mg Intravenous Given 11/22/23 1930)  oxyCODONE-acetaminophen (PERCOCET/ROXICET) 5-325 MG per tablet 1 tablet (1 tablet Oral Given 11/22/23 2058)    Mobility walks     Focused Assessments Cardiac Assessment Handoff:  Cardiac Rhythm: Normal sinus rhythm (HR 84) No results found for: "CKTOTAL", "CKMB", "CKMBINDEX", "TROPONINI" No results found for: "DDIMER" Does the Patient currently have chest pain? No    R Recommendations: See Admitting Provider Note  Report given to:   Additional Notes: recent hernia repair, ileus likely r/t recent anesthesia, NPO x meds for now, intractable pain, has not c/o nausea all day, just pain, zofran "caused nausea", switched to phenergan, NS 75cc/hr, plan...NPO and watch, NSR HR 84, VSS

## 2023-11-23 NOTE — Progress Notes (Signed)
CC: Post-Operative Ileus Subjective: The patient reports continued abdominal pain.  This is throughout his abdomen.  He says it is centered on the right side as well as his right groin.  He continues to have nausea with some vomiting but is passing flatus.  Objective: Vital signs in last 24 hours: Temp:  [97.8 F (36.6 C)-99.4 F (37.4 C)] 98.9 F (37.2 C) (11/29 1345) Pulse Rate:  [73-93] 78 (11/29 1200) Resp:  [18-26] 18 (11/29 0934) BP: (123-156)/(60-89) 145/84 (11/29 1200) SpO2:  [91 %-98 %] 95 % (11/29 1200) Weight:  [62.6 kg] 62.6 kg (11/28 1648)    Intake/Output from previous day: 11/28 0701 - 11/29 0700 In: -  Out: 250 [Urine:150; Emesis/NG output:100] Intake/Output this shift: Total I/O In: 1000 [I.V.:1000] Out: 275 [Urine:275]  Physical exam:  His abdomen is nondistended, it is tender to palpation in the right hemiabdomen as well as the right groin.  I do not appreciate any immediate postoperative recurrence from his surgery.  There is subcutaneous air  Lab Results: CBC  Recent Labs    11/22/23 1702 11/23/23 0614  WBC 13.9* 14.5*  HGB 13.2 11.2*  HCT 42.3 35.2*  PLT 373 326   BMET Recent Labs    11/22/23 1702 11/23/23 0614  NA 137 138  K 4.6 4.0  CL 99 100  CO2 27 26  GLUCOSE 134* 96  BUN 16 14  CREATININE 1.00 0.96  CALCIUM 9.3 9.0   PT/INR No results for input(s): "LABPROT", "INR" in the last 72 hours. ABG No results for input(s): "PHART", "HCO3" in the last 72 hours.  Invalid input(s): "PCO2", "PO2"  Studies/Results: US Abdomen Limited RUQ (LIVER/GB)  Result Date: 11/22/2023 CLINICAL DATA:  Acute pancreatitis inguinal hernia repair EXAM: ULTRASOUND ABDOMEN LIMITED RIGHT UPPER QUADRANT COMPARISON:  CT 11/22/2023 FINDINGS: Gallbladder: No gallstones or wall thickening visualized. No sonographic Murphy sign noted by sonographer. Common bile duct: Diameter: 5 mm Liver: No focal lesion identified. Within normal limits in parenchymal  echogenicity. Portal vein is patent on color Doppler imaging with normal direction of blood flow towards the liver. Other: None. IMPRESSION: Negative examination. Electronically Signed   By: Jasmine Pang M.D.   On: 11/22/2023 23:48   CT ABDOMEN PELVIS W CONTRAST  Result Date: 11/22/2023 CLINICAL DATA:  Laparoscopic inguinal hernia repair yesterday. Severe pain vomiting. EXAM: CT ABDOMEN AND PELVIS WITH CONTRAST TECHNIQUE: Multidetector CT imaging of the abdomen and pelvis was performed using the standard protocol following bolus administration of intravenous contrast. RADIATION DOSE REDUCTION: This exam was performed according to the departmental dose-optimization program which includes automated exposure control, adjustment of the mA and/or kV according to patient size and/or use of iterative reconstruction technique. CONTRAST:  OMNIPAQUE IOHEXOL 300 MG/ML  SOLN COMPARISON:  CT of the abdomen and pelvis without contrast 01/14/2021 FINDINGS: Lower chest: The lung bases are clear without focal nodule, mass, or airspace disease. Heart size is normal. No significant pleural or pericardial effusion is present. Hepatobiliary: Choose 1 Pancreas: Mild dilation of pancreatic duct is present. No obstructing lesion is present. The pancreas is otherwise within normal limits. Spleen: Normal in size without focal abnormality. Adrenals/Urinary Tract: Adrenal glands are unremarkable. Kidneys are normal, without renal calculi, focal lesion, or hydronephrosis. Bladder is unremarkable. Stomach/Bowel: The stomach and duodenum are within normal limits. The small bowel is within normal limits proximally. Fluid and gas extending into the right inguinal canal. This may represent a loop of bowel although direct continuity is unclear. Moderate stool is present  throughout the colon. Tiny locules of free air are likely within normal limits following surgery yesterday. Vascular/Lymphatic: Atherosclerotic calcifications are present  in the aorta and branch vessels. No aneurysm is present. Reproductive: Prostate is unremarkable.  Field Other: No other significant ventral hernia is present. Musculoskeletal: Vertebral body heights are normal. Straightening of the normal lumbar lordosis is present. Mild rightward curvature is present the upper lumbar spine. Endplate degenerative changes are most evident at L5-S1. The SI joints are fused bilaterally. The hips are located and within normal limits bilaterally. IMPRESSION: 1. Fluid and gas extending into the right inguinal canal. This may represent a loop of bowel although direct continuity is unclear. 2. Tiny locules of free air are likely within normal limits following surgery yesterday. 3. Moderate stool throughout the colon suggests constipation. 4. Mild dilation of the pancreatic duct without obstructing lesion. This is likely incidental. 5. Fusion of the SI joints bilaterally compatible with ankylosing spondylitis. 6.  Aortic Atherosclerosis (ICD10-I70.0). These results were called by telephone at the time of interpretation on 11/22/2023 at 6:52 pm to provider [facility] , who verbally acknowledged these results. Electronically Signed   By: Marin Roberts M.D.   On: 11/22/2023 18:52    Anti-infectives: Anti-infectives (From admission, onward)    None       Assessment/Plan:  The patient is a 72 year old who is status post robotic assisted right inguinal hernia repair 2 days ago.  He presented back to the hospital with abdominal pain as well as nausea and vomiting.  He has not had any subsequent episode of vomiting since yesterday and reports passing flatus.  At this time would keep him n.p.o. because he is in some pain and feels quite nauseated.  I also agree that I do not think if there is immediate postoperative recurrence.  He does have a worsening leukocytosis so we will trend this.  I think at this time he needs any antibiotics  Baker Pierini, M.D. [facility]

## 2023-11-23 NOTE — Progress Notes (Signed)
Approximately 1715-- Pt arrived to room 223 from ED. Upon arrival, pt changed into gown and VS obtained. A&O x 4. Full assessment completed by this RN. Pt oriented to unit and room. Fall precautions in place.

## 2023-11-24 DIAGNOSIS — R112 Nausea with vomiting, unspecified: Secondary | ICD-10-CM | POA: Diagnosis not present

## 2023-11-24 LAB — CBC WITH DIFFERENTIAL/PLATELET
Abs Immature Granulocytes: 0.08 10*3/uL — ABNORMAL HIGH (ref 0.00–0.07)
Basophils Absolute: 0 10*3/uL (ref 0.0–0.1)
Basophils Relative: 0 %
Eosinophils Absolute: 0 10*3/uL (ref 0.0–0.5)
Eosinophils Relative: 0 %
HCT: 38.7 % — ABNORMAL LOW (ref 39.0–52.0)
Hemoglobin: 12.3 g/dL — ABNORMAL LOW (ref 13.0–17.0)
Immature Granulocytes: 1 %
Lymphocytes Relative: 7 %
Lymphs Abs: 1.1 10*3/uL (ref 0.7–4.0)
MCH: 22 pg — ABNORMAL LOW (ref 26.0–34.0)
MCHC: 31.8 g/dL (ref 30.0–36.0)
MCV: 69.4 fL — ABNORMAL LOW (ref 80.0–100.0)
Monocytes Absolute: 1 10*3/uL (ref 0.1–1.0)
Monocytes Relative: 7 %
Neutro Abs: 12.8 10*3/uL — ABNORMAL HIGH (ref 1.7–7.7)
Neutrophils Relative %: 85 %
Platelets: 308 10*3/uL (ref 150–400)
RBC: 5.58 MIL/uL (ref 4.22–5.81)
RDW: 16.2 % — ABNORMAL HIGH (ref 11.5–15.5)
WBC: 15 10*3/uL — ABNORMAL HIGH (ref 4.0–10.5)
nRBC: 0 % (ref 0.0–0.2)

## 2023-11-24 LAB — BASIC METABOLIC PANEL
Anion gap: 11 (ref 5–15)
BUN: 15 mg/dL (ref 8–23)
CO2: 25 mmol/L (ref 22–32)
Calcium: 9 mg/dL (ref 8.9–10.3)
Chloride: 99 mmol/L (ref 98–111)
Creatinine, Ser: 0.88 mg/dL (ref 0.61–1.24)
GFR, Estimated: >60 mL/min (ref >60)
Glucose, Bld: 96 mg/dL (ref 70–99)
Potassium: 4 mmol/L (ref 3.5–5.1)
Sodium: 135 mmol/L (ref 135–145)

## 2023-11-24 LAB — GLUCOSE, CAPILLARY
Glucose-Capillary: 82 mg/dL (ref 70–99)
Glucose-Capillary: 93 mg/dL (ref 70–99)

## 2023-11-24 MED ORDER — HYDROMORPHONE HCL 1 MG/ML IJ SOLN
0.5000 mg | INTRAMUSCULAR | Status: DC | PRN
  Administered 2023-11-24 (×2): 1 mg via INTRAVENOUS
  Administered 2023-11-24 (×2): 0.5 mg via INTRAVENOUS
  Administered 2023-11-25 – 2023-11-26 (×11): 1 mg via INTRAVENOUS
  Filled 2023-11-24 (×15): qty 1

## 2023-11-24 MED ORDER — ORAL CARE MOUTH RINSE: 15.0000 mL | OROMUCOSAL | Status: DC | PRN

## 2023-11-24 MED ORDER — SODIUM CHLORIDE 0.9 % IV SOLN: INTRAVENOUS | Status: AC

## 2023-11-24 NOTE — Progress Notes (Signed)
Approximately 1400-- This RN messaged MD regarding pt. Pt currently still NPO due to vomiting x 2 this AM. Per MD, pt to remain on 0.9% Na gtt. Holding off on starting D5 gtt at this time. Orders placed for CBG checks q6. Per MD, if CBG trends low, D5 gtt will be considered.

## 2023-11-24 NOTE — Progress Notes (Signed)
PROGRESS NOTE    Jerry Pena  ZOX:[id number] DOB: 03/18/1951 DOA: 11/22/2023 PCP: Berniece Salines, FNP    Brief Narrative:  72 y.o. African-American male with medical history significant for prostate cancer status post prostatectomy, dyslipidemia, GERD, DDD, s/p laparoscopic right inguinal hernia repair by Dr. Dolores Frame here at [facility] yesterday he was been having significant pain since his surgery yesterday with associated nausea and vomiting.  He denies any hematemesis or coffee-ground emesis.  It has been occasionally greenish though.  He has not been able to keep anything down since his surgery.  He continues to pass flatus.  No dysuria or oliguria or hematuria or flank pain.  No cough or wheezing or dyspnea.  No chest pain or palpitations.  No fever but he has been having occasional chills.    Assessment & Plan:   Principal Problem:   Intractable nausea and vomiting Active Problems:   Idiopathic acute pancreatitis   Leukocytosis   Dyslipidemia  * Intractable nausea and vomiting Patient seen in consultation by general surgery.  Discussed findings of CT scan.  Working diagnosis is air seen in right groin on CAT scan and secondary to residual insufflation and possibly a postoperative ileus from anesthesia and narcotic use.. Clinically improving over interval Plan: Maintain n.p.o. for now New IVF Pain control Antiemetics as needed Surgical follow-up  Pancreatitis, ruled out Suspect patient's symptoms are more secondary to recent surgical intervention and postoperative ileus.  Low suspicion for acute pancreatitis.  CT also reassuring. Plan: Conservative management as above  Leukocytosis Suspect stress reaction.  Low suspicion for infectious etiology at this time.  Monitor off antibiotics.  Daily labs   Dyslipidemia Managed by diet    DVT prophylaxis: SQ Lovenox Code Status: Full Family Communication: Attempted to call son Sharyl Nimrod [phone] on 11/30.  No answer.   Voicemail left. Disposition Plan: Status is: Inpatient Remains inpatient appropriate because: Intractable nausea and vomiting   Level of care: Med-Surg  Consultants:  General surgery  Procedures:  None  Antimicrobials: None   Subjective: Seen and examined.  Sitting up in chair.  Endorses abdominal pain.  Objective: Vitals:   11/23/23 1759 11/23/23 2011 11/24/23 0405 11/24/23 0818  BP:  (!) 160/85 (!) 157/76 (!) 160/84  Pulse:  80 73 79  Resp:  18 18 18   Temp:  98.9 F (37.2 C) 99.7 F (37.6 C) 98.8 F (37.1 C)  TempSrc:  Oral    SpO2:  98% 97% 97%  Weight: 57.1 kg     Height: 5\' 8"  (1.727 m)       Intake/Output Summary (Last 24 hours) at 11/24/2023 1111 Last data filed at 11/23/2023 1838 Gross per 24 hour  Intake 1516.25 ml  Output --  Net 1516.25 ml   Filed Weights   11/22/23 1648 11/23/23 1759  Weight: 62.6 kg 57.1 kg    Examination:  General exam: No acute distress Respiratory system: Clear to auscultation. Respiratory effort normal. Cardiovascular system: S2, RRR, no murmurs, no pedal edema Gastrointestinal system: Soft, nondistended, TTP periumbilical area and right groin Central nervous system: Alert and oriented. No focal neurological deficits. Extremities: Symmetric 5 x 5 power. Skin: No rashes, lesions or ulcers Psychiatry: Judgement and insight appear normal. Mood & affect appropriate.     Data Reviewed: I have personally reviewed following labs and imaging studies  CBC: Recent Labs  Lab 11/22/23 1702 11/23/23 0614 11/24/23 0824  WBC 13.9* 14.5* 15.0*  NEUTROABS  --   --  12.8*  HGB 13.2  11.2* 12.3*  HCT 42.3 35.2* 38.7*  MCV 72.1* 70.0* 69.4*  PLT 373 326 308   Basic Metabolic Panel: Recent Labs  Lab 11/22/23 1702 11/23/23 0614 11/24/23 0824  NA 137 138 135  K 4.6 4.0 4.0  CL 99 100 99  CO2 27 26 25   GLUCOSE 134* 96 96  BUN 16 14 15   CREATININE 1.00 0.96 0.88  CALCIUM 9.3 9.0 9.0   GFR: Estimated Creatinine  Clearance: 60.4 mL/min (by C-G formula based on SCr of 0.88 mg/dL). Liver Function Tests: Recent Labs  Lab 11/22/23 1702  AST 30  ALT 13  ALKPHOS 85  BILITOT 1.0  PROT 8.1  ALBUMIN 4.4   Recent Labs  Lab 11/22/23 1702 11/23/23 0614  LIPASE 397* 143*   No results for input(s): "AMMONIA" in the last 168 hours. Coagulation Profile: No results for input(s): "INR", "PROTIME" in the last 168 hours. Cardiac Enzymes: No results for input(s): "CKTOTAL", "CKMB", "CKMBINDEX", "TROPONINI" in the last 168 hours. BNP (last 3 results) No results for input(s): "PROBNP" in the last 8760 hours. HbA1C: No results for input(s): "HGBA1C" in the last 72 hours. CBG: No results for input(s): "GLUCAP" in the last 168 hours. Lipid Profile: No results for input(s): "CHOL", "HDL", "LDLCALC", "TRIG", "CHOLHDL", "LDLDIRECT" in the last 72 hours. Thyroid Function Tests: No results for input(s): "TSH", "T4TOTAL", "FREET4", "T3FREE", "THYROIDAB" in the last 72 hours. Anemia Panel: No results for input(s): "VITAMINB12", "FOLATE", "FERRITIN", "TIBC", "IRON", "RETICCTPCT" in the last 72 hours. Sepsis Labs: No results for input(s): "PROCALCITON", "LATICACIDVEN" in the last 168 hours.  No results found for this or any previous visit (from the past 240 hour(s)).       Radiology Studies: US Abdomen Limited RUQ (LIVER/GB)  Result Date: 11/22/2023 CLINICAL DATA:  Acute pancreatitis inguinal hernia repair EXAM: ULTRASOUND ABDOMEN LIMITED RIGHT UPPER QUADRANT COMPARISON:  CT 11/22/2023 FINDINGS: Gallbladder: No gallstones or wall thickening visualized. No sonographic Murphy sign noted by sonographer. Common bile duct: Diameter: 5 mm Liver: No focal lesion identified. Within normal limits in parenchymal echogenicity. Portal vein is patent on color Doppler imaging with normal direction of blood flow towards the liver. Other: None. IMPRESSION: Negative examination. Electronically Signed   By: Jasmine Pang M.D.    On: 11/22/2023 23:48   CT ABDOMEN PELVIS W CONTRAST  Result Date: 11/22/2023 CLINICAL DATA:  Laparoscopic inguinal hernia repair yesterday. Severe pain vomiting. EXAM: CT ABDOMEN AND PELVIS WITH CONTRAST TECHNIQUE: Multidetector CT imaging of the abdomen and pelvis was performed using the standard protocol following bolus administration of intravenous contrast. RADIATION DOSE REDUCTION: This exam was performed according to the departmental dose-optimization program which includes automated exposure control, adjustment of the mA and/or kV according to patient size and/or use of iterative reconstruction technique. CONTRAST:  OMNIPAQUE IOHEXOL 300 MG/ML  SOLN COMPARISON:  CT of the abdomen and pelvis without contrast 01/14/2021 FINDINGS: Lower chest: The lung bases are clear without focal nodule, mass, or airspace disease. Heart size is normal. No significant pleural or pericardial effusion is present. Hepatobiliary: Choose 1 Pancreas: Mild dilation of pancreatic duct is present. No obstructing lesion is present. The pancreas is otherwise within normal limits. Spleen: Normal in size without focal abnormality. Adrenals/Urinary Tract: Adrenal glands are unremarkable. Kidneys are normal, without renal calculi, focal lesion, or hydronephrosis. Bladder is unremarkable. Stomach/Bowel: The stomach and duodenum are within normal limits. The small bowel is within normal limits proximally. Fluid and gas extending into the right inguinal canal. This may represent  a loop of bowel although direct continuity is unclear. Moderate stool is present throughout the colon. Tiny locules of free air are likely within normal limits following surgery yesterday. Vascular/Lymphatic: Atherosclerotic calcifications are present in the aorta and branch vessels. No aneurysm is present. Reproductive: Prostate is unremarkable.  Field Other: No other significant ventral hernia is present. Musculoskeletal: Vertebral body heights are normal.  Straightening of the normal lumbar lordosis is present. Mild rightward curvature is present the upper lumbar spine. Endplate degenerative changes are most evident at L5-S1. The SI joints are fused bilaterally. The hips are located and within normal limits bilaterally. IMPRESSION: 1. Fluid and gas extending into the right inguinal canal. This may represent a loop of bowel although direct continuity is unclear. 2. Tiny locules of free air are likely within normal limits following surgery yesterday. 3. Moderate stool throughout the colon suggests constipation. 4. Mild dilation of the pancreatic duct without obstructing lesion. This is likely incidental. 5. Fusion of the SI joints bilaterally compatible with ankylosing spondylitis. 6.  Aortic Atherosclerosis (ICD10-I70.0). These results were called by telephone at the time of interpretation on 11/22/2023 at 6:52 pm to provider [facility] , who verbally acknowledged these results. Electronically Signed   By: Marin Roberts M.D.   On: 11/22/2023 18:52        Scheduled Meds:  calcium-vitamin D  1 tablet Oral Daily   enoxaparin (LOVENOX) injection  40 mg Subcutaneous Q24H   folic acid  0.5 mg Oral Daily   multivitamin with minerals  1 tablet Oral Daily   polyethylene glycol  17 g Oral Daily   vitamin B-12  100 mcg Oral Daily   Continuous Infusions:  sodium chloride 75 mL/hr at 11/24/23 0933   promethazine (PHENERGAN) injection (IM or IVPB) 12.5 mg (11/24/23 0324)     LOS: 1 day     Tresa Moore, MD [facility]   If 7PM-7AM, please contact night-coverage  11/24/2023, 11:11 AM

## 2023-11-24 NOTE — Progress Notes (Signed)
CC: Post-Operative Ileus Subjective: The patient reports abdominal pain continues but improved. He reports feeling nauseated with emesis this morning. Denies any flatus or bowel movements.   Objective: Vital signs in last 24 hours: Temp:  [98.5 F (36.9 C)-99.7 F (37.6 C)] 98.8 F (37.1 C) (11/30 0818) Pulse Rate:  [73-85] 79 (11/30 0818) Resp:  [18] 18 (11/30 0818) BP: (139-160)/(76-88) 160/84 (11/30 0818) SpO2:  [94 %-98 %] 97 % (11/30 0818) Weight:  [57.1 kg] 57.1 kg (11/29 1759) Last BM Date : 11/20/23  Intake/Output from previous day: 11/29 0701 - 11/30 0700 In: 1516.3 [I.V.:1466.3; IV Piggyback:50] Out: 275 [Urine:275] Intake/Output this shift: Total I/O In: 276.7 [I.V.:226.7; IV Piggyback:50] Out: -   Physical exam:  His abdomen is nondistended, it is tender to palpation in the right hemiabdomen as well as the right groin. But this is improved from yesterday, does not have recurrence of hernia   Lab Results: CBC  Recent Labs    11/23/23 0614 11/24/23 0824  WBC 14.5* 15.0*  HGB 11.2* 12.3*  HCT 35.2* 38.7*  PLT 326 308   BMET Recent Labs    11/23/23 0614 11/24/23 0824  NA 138 135  K 4.0 4.0  CL 100 99  CO2 26 25  GLUCOSE 96 96  BUN 14 15  CREATININE 0.96 0.88  CALCIUM 9.0 9.0   PT/INR No results for input(s): "LABPROT", "INR" in the last 72 hours. ABG No results for input(s): "PHART", "HCO3" in the last 72 hours.  Invalid input(s): "PCO2", "PO2"  Studies/Results: US Abdomen Limited RUQ (LIVER/GB)  Result Date: 11/22/2023 CLINICAL DATA:  Acute pancreatitis inguinal hernia repair EXAM: ULTRASOUND ABDOMEN LIMITED RIGHT UPPER QUADRANT COMPARISON:  CT 11/22/2023 FINDINGS: Gallbladder: No gallstones or wall thickening visualized. No sonographic Murphy sign noted by sonographer. Common bile duct: Diameter: 5 mm Liver: No focal lesion identified. Within normal limits in parenchymal echogenicity. Portal vein is patent on color Doppler imaging with  normal direction of blood flow towards the liver. Other: None. IMPRESSION: Negative examination. Electronically Signed   By: Jasmine Pang M.D.   On: 11/22/2023 23:48   CT ABDOMEN PELVIS W CONTRAST  Result Date: 11/22/2023 CLINICAL DATA:  Laparoscopic inguinal hernia repair yesterday. Severe pain vomiting. EXAM: CT ABDOMEN AND PELVIS WITH CONTRAST TECHNIQUE: Multidetector CT imaging of the abdomen and pelvis was performed using the standard protocol following bolus administration of intravenous contrast. RADIATION DOSE REDUCTION: This exam was performed according to the departmental dose-optimization program which includes automated exposure control, adjustment of the mA and/or kV according to patient size and/or use of iterative reconstruction technique. CONTRAST:  OMNIPAQUE IOHEXOL 300 MG/ML  SOLN COMPARISON:  CT of the abdomen and pelvis without contrast 01/14/2021 FINDINGS: Lower chest: The lung bases are clear without focal nodule, mass, or airspace disease. Heart size is normal. No significant pleural or pericardial effusion is present. Hepatobiliary: Choose 1 Pancreas: Mild dilation of pancreatic duct is present. No obstructing lesion is present. The pancreas is otherwise within normal limits. Spleen: Normal in size without focal abnormality. Adrenals/Urinary Tract: Adrenal glands are unremarkable. Kidneys are normal, without renal calculi, focal lesion, or hydronephrosis. Bladder is unremarkable. Stomach/Bowel: The stomach and duodenum are within normal limits. The small bowel is within normal limits proximally. Fluid and gas extending into the right inguinal canal. This may represent a loop of bowel although direct continuity is unclear. Moderate stool is present throughout the colon. Tiny locules of free air are likely within normal limits following surgery yesterday. Vascular/Lymphatic:  Atherosclerotic calcifications are present in the aorta and branch vessels. No aneurysm is present.  Reproductive: Prostate is unremarkable.  Field Other: No other significant ventral hernia is present. Musculoskeletal: Vertebral body heights are normal. Straightening of the normal lumbar lordosis is present. Mild rightward curvature is present the upper lumbar spine. Endplate degenerative changes are most evident at L5-S1. The SI joints are fused bilaterally. The hips are located and within normal limits bilaterally. IMPRESSION: 1. Fluid and gas extending into the right inguinal canal. This may represent a loop of bowel although direct continuity is unclear. 2. Tiny locules of free air are likely within normal limits following surgery yesterday. 3. Moderate stool throughout the colon suggests constipation. 4. Mild dilation of the pancreatic duct without obstructing lesion. This is likely incidental. 5. Fusion of the SI joints bilaterally compatible with ankylosing spondylitis. 6.  Aortic Atherosclerosis (ICD10-I70.0). These results were called by telephone at the time of interpretation on 11/22/2023 at 6:52 pm to provider [facility] , who verbally acknowledged these results. Electronically Signed   By: Marin Roberts M.D.   On: 11/22/2023 18:52    Anti-infectives: Anti-infectives (From admission, onward)    None       Assessment/Plan:  The patient is a 72 year old who is status post robotic assisted right inguinal hernia repair 2 days ago.  He presented back to the hospital with abdominal pain as well as nausea and vomiting.  He had an episode of emesis today. Would keep him NPO. His leukocytosis is trending upwards. I do not see obvious source of infection on CT or on physical exam. This oculd be secondary to vomiting episodes, continue to trent but would hold off on abx for now.   Baker Pierini, M.D. [facility]

## 2023-11-24 NOTE — Plan of Care (Signed)

## 2023-11-25 ENCOUNTER — Inpatient Hospital Stay: Payer: 59

## 2023-11-25 DIAGNOSIS — R112 Nausea with vomiting, unspecified: Secondary | ICD-10-CM | POA: Diagnosis not present

## 2023-11-25 LAB — CBC WITH DIFFERENTIAL/PLATELET
Abs Immature Granulocytes: 0.06 10*3/uL (ref 0.00–0.07)
Basophils Absolute: 0 10*3/uL (ref 0.0–0.1)
Basophils Relative: 0 %
Eosinophils Absolute: 0 10*3/uL (ref 0.0–0.5)
Eosinophils Relative: 0 %
HCT: 34.6 % — ABNORMAL LOW (ref 39.0–52.0)
Hemoglobin: 10.9 g/dL — ABNORMAL LOW (ref 13.0–17.0)
Immature Granulocytes: 1 %
Lymphocytes Relative: 9 %
Lymphs Abs: 1.1 10*3/uL (ref 0.7–4.0)
MCH: 22 pg — ABNORMAL LOW (ref 26.0–34.0)
MCHC: 31.5 g/dL (ref 30.0–36.0)
MCV: 69.9 fL — ABNORMAL LOW (ref 80.0–100.0)
Monocytes Absolute: 1.1 10*3/uL — ABNORMAL HIGH (ref 0.1–1.0)
Monocytes Relative: 9 %
Neutro Abs: 9.7 10*3/uL — ABNORMAL HIGH (ref 1.7–7.7)
Neutrophils Relative %: 81 %
Platelets: 281 10*3/uL (ref 150–400)
RBC: 4.95 MIL/uL (ref 4.22–5.81)
RDW: 16 % — ABNORMAL HIGH (ref 11.5–15.5)
Smear Review: NORMAL
WBC: 12 10*3/uL — ABNORMAL HIGH (ref 4.0–10.5)
nRBC: 0 % (ref 0.0–0.2)

## 2023-11-25 LAB — BASIC METABOLIC PANEL
Anion gap: 11 (ref 5–15)
BUN: 17 mg/dL (ref 8–23)
CO2: 24 mmol/L (ref 22–32)
Calcium: 8.5 mg/dL — ABNORMAL LOW (ref 8.9–10.3)
Chloride: 103 mmol/L (ref 98–111)
Creatinine, Ser: 0.77 mg/dL (ref 0.61–1.24)
GFR, Estimated: >60 mL/min (ref >60)
Glucose, Bld: 79 mg/dL (ref 70–99)
Potassium: 3.7 mmol/L (ref 3.5–5.1)
Sodium: 138 mmol/L (ref 135–145)

## 2023-11-25 LAB — GLUCOSE, CAPILLARY
Glucose-Capillary: 80 mg/dL (ref 70–99)
Glucose-Capillary: 90 mg/dL (ref 70–99)
Glucose-Capillary: 99 mg/dL (ref 70–99)

## 2023-11-25 MED ORDER — SODIUM CHLORIDE 0.9 % IV SOLN: INTRAVENOUS | Status: AC

## 2023-11-25 MED ORDER — DOCUSATE SODIUM 100 MG PO CAPS
100.0000 mg | ORAL_CAPSULE | Freq: Every day | ORAL | Status: DC
  Administered 2023-11-25 – 2023-11-27 (×3): 100 mg via ORAL
  Filled 2023-11-25 (×5): qty 1

## 2023-11-25 MED ORDER — SENNOSIDES-DOCUSATE SODIUM 8.6-50 MG PO TABS
1.0000 | ORAL_TABLET | Freq: Every day | ORAL | Status: DC
  Administered 2023-11-25 – 2023-11-26 (×2): 1 via ORAL
  Filled 2023-11-25 (×3): qty 1

## 2023-11-25 MED ORDER — POLYETHYLENE GLYCOL 3350 17 G PO PACK
17.0000 g | PACK | Freq: Two times a day (BID) | ORAL | Status: DC
  Administered 2023-11-25 – 2023-11-27 (×4): 17 g via ORAL
  Filled 2023-11-25 (×6): qty 1

## 2023-11-25 NOTE — Plan of Care (Signed)

## 2023-11-25 NOTE — Progress Notes (Signed)
CC: Post-Operative Ileus Subjective: Patient reports continued pain this morning.  He denies any flatus.  He does not feel bloated.  His leukocytosis has improved.  Objective: Vital signs in last 24 hours: Temp:  [98.2 F (36.8 C)-99.1 F (37.3 C)] 99.1 F (37.3 C) (12/01 0920) Pulse Rate:  [75-93] 87 (12/01 0920) Resp:  [16-20] 18 (12/01 0920) BP: (139-162)/(77-91) 146/77 (12/01 0920) SpO2:  [94 %-96 %] 95 % (12/01 0920) Last BM Date : 11/20/23  Intake/Output from previous day: 11/30 0701 - 12/01 0700 In: 1449.3 [I.V.:1399.3; IV Piggyback:50] Out: -  Intake/Output this shift: No intake/output data recorded.  Physical exam: On exam patient has pain when I have her my hand approximately 3 to 4 inches above his abdomen.  When I palpated his abdomen even just placing my hand on it causes extreme pain.  However when I bumped the bed he does not have any signs of peritonitis.  He is not distended.  Lab Results: CBC  Recent Labs    11/24/23 0824 11/25/23 0536  WBC 15.0* 12.0*  HGB 12.3* 10.9*  HCT 38.7* 34.6*  PLT 308 281   BMET Recent Labs    11/24/23 0824 11/25/23 0536  NA 135 138  K 4.0 3.7  CL 99 103  CO2 25 24  GLUCOSE 96 79  BUN 15 17  CREATININE 0.88 0.77  CALCIUM 9.0 8.5*   PT/INR No results for input(s): "LABPROT", "INR" in the last 72 hours. ABG No results for input(s): "PHART", "HCO3" in the last 72 hours.  Invalid input(s): "PCO2", "PO2"  Studies/Results: DG Abd 1 View  Result Date: 11/25/2023 CLINICAL DATA:  Abdominal distension.  Postoperative ileus EXAM: ABDOMEN - 1 VIEW COMPARISON:  Abdominal CT 11/22/2023. FINDINGS: Diffuse gas within the colon without over distension. Stool is seen proximally. No small bowel dilatation is seen. Mild opacity at the right base overlapping the diaphragm, presumably atelectasis given interval development. Left acetabular ossicle and lumbar spine degeneration. IMPRESSION: Diffuse colonic gas with mild stool without  worrisome over distension. Electronically Signed   By: Tiburcio Pea M.D.   On: 11/25/2023 09:27    Anti-infectives: Anti-infectives (From admission, onward)    None       Assessment/Plan:  The patient is a 72 year old who is status post robotic assisted right inguinal hernia repair several days ago.  He presented back to the hospital with abdominal pain as well as nausea and vomiting.  His last recorded Mrs. Episode was yesterday morning.  He is not distended on exam.  He does have pain when you go to even reach to examine him.  I have obtained a KUB.  The KUB just shows that there is stool in the right colon with some distention of his left colon.  For now think it is okay to trial a clear liquid diet.  I have also upped his bowel regimen to MiraLAX to twice daily.  I have added Colace and senna in hopes that this pain is secondary to constipation and that this may help him improve.  Baker Pierini, M.D. [facility]

## 2023-11-25 NOTE — TOC CM/SW Note (Signed)
Transition of Care [facility]) - Inpatient Brief Assessment   Patient Details  Name: SHAKER SARKER MRN: [id number] Date of Birth: Sep 10, 1951  Transition of Care [facility]) CM/SW Contact:    Liliana Cline, LCSW Phone Number: 11/25/2023, 9:17 AM  Transition of Care Asessment: Insurance and Status: Insurance coverage has been reviewed Patient has primary care physician: Yes     Prior/Current Home Services: No current home services Social Determinants of Health Reivew: SDOH reviewed no interventions necessary Readmission risk has been reviewed: Yes Transition of care needs: no transition of care needs at this time

## 2023-11-25 NOTE — Progress Notes (Signed)
PROGRESS NOTE    Jerry Pena  NUU:[id number] DOB: 04-Nov-1951 DOA: 11/22/2023 PCP: Berniece Salines, FNP    Brief Narrative:  72 y.o. African-American male with medical history significant for prostate cancer status post prostatectomy, dyslipidemia, GERD, DDD, s/p laparoscopic right inguinal hernia repair by Dr. Dolores Frame here at [facility] yesterday he was been having significant pain since his surgery yesterday with associated nausea and vomiting.  He denies any hematemesis or coffee-ground emesis.  It has been occasionally greenish though.  He has not been able to keep anything down since his surgery.  He continues to pass flatus.  No dysuria or oliguria or hematuria or flank pain.  No cough or wheezing or dyspnea.  No chest pain or palpitations.  No fever but he has been having occasional chills.    Assessment & Plan:   Principal Problem:   Intractable nausea and vomiting Active Problems:   Idiopathic acute pancreatitis   Leukocytosis   Dyslipidemia  Intractable nausea and vomiting Patient seen in consultation by general surgery.  Discussed findings of CT scan.  Working diagnosis is air seen in right groin on CAT scan and secondary to residual insufflation and possibly a postoperative ileus from anesthesia and narcotic use.. Still with significant abdominal pain that is difficult to ascertain etiology.  Patient endorses a substantial pain even with very light palpation of his abdomen.  KUB referred by general surgery does demonstrate a moderate stool burden Plan: Trial on clear liquid diet Continue IVF for now Escalate bowel regimen Pain control, minimize narcotics Antiemetics as needed Surgical follow-up  Pancreatitis, ruled out Suspect patient's symptoms are more secondary to recent surgical intervention and postoperative ileus.  Low suspicion for acute pancreatitis.  CT also reassuring. Plan: Conservative management as above  Leukocytosis Suspect stress reaction.  Low  suspicion for infectious etiology at this time.  Monitor off antibiotics.  White count downtrending over interval.  Continue with daily labs.   Dyslipidemia Managed by diet    DVT prophylaxis: SQ Lovenox Code Status: Full Family Communication: Attempted to call son Sharyl Nimrod [phone] on 11/30.  No answer.  Voicemail left. Disposition Plan: Status is: Inpatient Remains inpatient appropriate because: Intractable nausea and vomiting   Level of care: Med-Surg  Consultants:  General surgery  Procedures:  None  Antimicrobials: None   Subjective: Seen and examined.  Lying in bed.  Mild distress  Objective: Vitals:   11/24/23 1613 11/24/23 2026 11/25/23 0408 11/25/23 0920  BP: (!) 162/88 (!) 139/91 (!) 147/89 (!) 146/77  Pulse: 75 93 83 87  Resp: 18 20 16 18   Temp: 98.7 F (37.1 C) 99.1 F (37.3 C) 98.2 F (36.8 C) 99.1 F (37.3 C)  TempSrc:  Oral Oral Oral  SpO2: 96% 94% 94% 95%  Weight:      Height:        Intake/Output Summary (Last 24 hours) at 11/25/2023 1131 Last data filed at 11/25/2023 0446 Gross per 24 hour  Intake 1449.32 ml  Output --  Net 1449.32 ml   Filed Weights   11/22/23 1648 11/23/23 1759  Weight: 62.6 kg 57.1 kg    Examination:  General exam: Mild dressed due to pain Respiratory system: Clear to auscultation. Respiratory effort normal. Cardiovascular system: S2, RRR, no murmurs, no pedal edema Gastrointestinal system: Soft, nondistended, diffuse tender to palpation Central nervous system: Alert and oriented. No focal neurological deficits. Extremities: Symmetric 5 x 5 power. Skin: No rashes, lesions or ulcers Psychiatry: Judgement and insight appear normal. Mood &  affect appropriate.     Data Reviewed: I have personally reviewed following labs and imaging studies  CBC: Recent Labs  Lab 11/22/23 1702 11/23/23 0614 11/24/23 0824 11/25/23 0536  WBC 13.9* 14.5* 15.0* 12.0*  NEUTROABS  --   --  12.8* 9.7*  HGB 13.2 11.2* 12.3*  10.9*  HCT 42.3 35.2* 38.7* 34.6*  MCV 72.1* 70.0* 69.4* 69.9*  PLT 373 326 308 281   Basic Metabolic Panel: Recent Labs  Lab 11/22/23 1702 11/23/23 0614 11/24/23 0824 11/25/23 0536  NA 137 138 135 138  K 4.6 4.0 4.0 3.7  CL 99 100 99 103  CO2 27 26 25 24   GLUCOSE 134* 96 96 79  BUN 16 14 15 17   CREATININE 1.00 0.96 0.88 0.77  CALCIUM 9.3 9.0 9.0 8.5*   GFR: Estimated Creatinine Clearance: 66.4 mL/min (by C-G formula based on SCr of 0.77 mg/dL). Liver Function Tests: Recent Labs  Lab 11/22/23 1702  AST 30  ALT 13  ALKPHOS 85  BILITOT 1.0  PROT 8.1  ALBUMIN 4.4   Recent Labs  Lab 11/22/23 1702 11/23/23 0614  LIPASE 397* 143*   No results for input(s): "AMMONIA" in the last 168 hours. Coagulation Profile: No results for input(s): "INR", "PROTIME" in the last 168 hours. Cardiac Enzymes: No results for input(s): "CKTOTAL", "CKMB", "CKMBINDEX", "TROPONINI" in the last 168 hours. BNP (last 3 results) No results for input(s): "PROBNP" in the last 8760 hours. HbA1C: No results for input(s): "HGBA1C" in the last 72 hours. CBG: Recent Labs  Lab 11/24/23 1740 11/24/23 2337  GLUCAP 82 93   Lipid Profile: No results for input(s): "CHOL", "HDL", "LDLCALC", "TRIG", "CHOLHDL", "LDLDIRECT" in the last 72 hours. Thyroid Function Tests: No results for input(s): "TSH", "T4TOTAL", "FREET4", "T3FREE", "THYROIDAB" in the last 72 hours. Anemia Panel: No results for input(s): "VITAMINB12", "FOLATE", "FERRITIN", "TIBC", "IRON", "RETICCTPCT" in the last 72 hours. Sepsis Labs: No results for input(s): "PROCALCITON", "LATICACIDVEN" in the last 168 hours.  No results found for this or any previous visit (from the past 240 hour(s)).       Radiology Studies: DG Abd 1 View  Result Date: 11/25/2023 CLINICAL DATA:  Abdominal distension.  Postoperative ileus EXAM: ABDOMEN - 1 VIEW COMPARISON:  Abdominal CT 11/22/2023. FINDINGS: Diffuse gas within the colon without over  distension. Stool is seen proximally. No small bowel dilatation is seen. Mild opacity at the right base overlapping the diaphragm, presumably atelectasis given interval development. Left acetabular ossicle and lumbar spine degeneration. IMPRESSION: Diffuse colonic gas with mild stool without worrisome over distension. Electronically Signed   By: Tiburcio Pea M.D.   On: 11/25/2023 09:27        Scheduled Meds:  calcium-vitamin D  1 tablet Oral Daily   docusate sodium  100 mg Oral Daily   enoxaparin (LOVENOX) injection  40 mg Subcutaneous Q24H   folic acid  0.5 mg Oral Daily   multivitamin with minerals  1 tablet Oral Daily   polyethylene glycol  17 g Oral BID   senna-docusate  1 tablet Oral QHS   vitamin B-12  100 mcg Oral Daily   Continuous Infusions:  sodium chloride 75 mL/hr at 11/25/23 1130   promethazine (PHENERGAN) injection (IM or IVPB) Stopped (11/24/23 0344)     LOS: 2 days     Tresa Moore, MD [facility]   If 7PM-7AM, please contact night-coverage  11/25/2023, 11:31 AM

## 2023-11-25 NOTE — Progress Notes (Signed)
Order received from Dr Georgeann Oppenheim to continue IVF at current rate

## 2023-11-26 ENCOUNTER — Encounter: Payer: Self-pay | Admitting: Internal Medicine

## 2023-11-26 DIAGNOSIS — R112 Nausea with vomiting, unspecified: Secondary | ICD-10-CM | POA: Diagnosis not present

## 2023-11-26 LAB — CBC WITH DIFFERENTIAL/PLATELET
Abs Immature Granulocytes: 0.03 10*3/uL (ref 0.00–0.07)
Basophils Absolute: 0 10*3/uL (ref 0.0–0.1)
Basophils Relative: 0 %
Eosinophils Absolute: 0 10*3/uL (ref 0.0–0.5)
Eosinophils Relative: 0 %
HCT: 33.2 % — ABNORMAL LOW (ref 39.0–52.0)
Hemoglobin: 10.6 g/dL — ABNORMAL LOW (ref 13.0–17.0)
Immature Granulocytes: 0 %
Lymphocytes Relative: 14 %
Lymphs Abs: 1.2 10*3/uL (ref 0.7–4.0)
MCH: 22.2 pg — ABNORMAL LOW (ref 26.0–34.0)
MCHC: 31.9 g/dL (ref 30.0–36.0)
MCV: 69.6 fL — ABNORMAL LOW (ref 80.0–100.0)
Monocytes Absolute: 0.9 10*3/uL (ref 0.1–1.0)
Monocytes Relative: 11 %
Neutro Abs: 6.4 10*3/uL (ref 1.7–7.7)
Neutrophils Relative %: 75 %
Platelets: 281 10*3/uL (ref 150–400)
RBC: 4.77 MIL/uL (ref 4.22–5.81)
RDW: 15.9 % — ABNORMAL HIGH (ref 11.5–15.5)
Smear Review: NORMAL
WBC: 8.5 10*3/uL (ref 4.0–10.5)
nRBC: 0 % (ref 0.0–0.2)

## 2023-11-26 LAB — GLUCOSE, CAPILLARY: Glucose-Capillary: 86 mg/dL (ref 70–99)

## 2023-11-26 LAB — BASIC METABOLIC PANEL
Anion gap: 10 (ref 5–15)
BUN: 13 mg/dL (ref 8–23)
CO2: 25 mmol/L (ref 22–32)
Calcium: 8.5 mg/dL — ABNORMAL LOW (ref 8.9–10.3)
Chloride: 103 mmol/L (ref 98–111)
Creatinine, Ser: 0.76 mg/dL (ref 0.61–1.24)
GFR, Estimated: >60 mL/min (ref >60)
Glucose, Bld: 84 mg/dL (ref 70–99)
Potassium: 3.6 mmol/L (ref 3.5–5.1)
Sodium: 138 mmol/L (ref 135–145)

## 2023-11-26 MED ORDER — HYDROMORPHONE HCL 1 MG/ML IJ SOLN
0.5000 mg | INTRAMUSCULAR | Status: DC | PRN
  Administered 2023-11-26 – 2023-11-27 (×6): 1 mg via INTRAVENOUS
  Filled 2023-11-26 (×5): qty 1

## 2023-11-26 MED ORDER — OXYCODONE HCL 5 MG/5ML PO SOLN
5.0000 mg | ORAL | Status: DC | PRN
  Administered 2023-11-26: 5 mg via ORAL
  Filled 2023-11-26 (×2): qty 5

## 2023-11-26 MED ORDER — SODIUM CHLORIDE 0.9 % IV SOLN: INTRAVENOUS | Status: DC

## 2023-11-26 NOTE — Progress Notes (Signed)
Order received from Dr Georgeann Oppenheim to renew IVF

## 2023-11-26 NOTE — Care Management Important Message (Signed)
Important Message  Patient Details  Name: Jerry Pena MRN: [id number] Date of Birth: 01-28-1951   Important Message Given:  N/A - LOS <3 / Initial given by admissions     Olegario Messier A Thalia Turkington 11/26/2023, 2:57 PM

## 2023-11-26 NOTE — Plan of Care (Signed)

## 2023-11-26 NOTE — Progress Notes (Signed)
PROGRESS NOTE    Jerry Pena  ZOX:[id number] DOB: 1951/12/13 DOA: 11/22/2023 PCP: Berniece Salines, FNP    Brief Narrative:  72 y.o. African-American male with medical history significant for prostate cancer status post prostatectomy, dyslipidemia, GERD, DDD, s/p laparoscopic right inguinal hernia repair by Dr. Dolores Frame here at [facility] yesterday he was been having significant pain since his surgery yesterday with associated nausea and vomiting.  He denies any hematemesis or coffee-ground emesis.  It has been occasionally greenish though.  He has not been able to keep anything down since his surgery.  He continues to pass flatus.  No dysuria or oliguria or hematuria or flank pain.  No cough or wheezing or dyspnea.  No chest pain or palpitations.  No fever but he has been having occasional chills.    Assessment & Plan:   Principal Problem:   Intractable nausea and vomiting Active Problems:   Idiopathic acute pancreatitis   Leukocytosis   Dyslipidemia  Intractable nausea and vomiting Patient seen in consultation by general surgery.  Discussed findings of CT scan.  Working diagnosis is air seen in right groin on CAT scan and secondary to residual insufflation and possibly a postoperative ileus from anesthesia and narcotic use.. Still with significant abdominal pain that is difficult to ascertain etiology.  Patient endorses a substantial pain even with very light palpation of his abdomen.  KUB referred by general surgery does demonstrate a moderate stool burden Plan: Continue clear liquid diet Continue IVF for now Bowel regimen escalated As needed pain control, minimize narcotics As needed antiemetics Surgical follow-up  Pancreatitis, ruled out Suspect patient's symptoms are more secondary to recent surgical intervention and postoperative ileus.  Low suspicion for acute pancreatitis.  CT also reassuring. Plan: Conservative management as above  Leukocytosis Suspect stress reaction.   Low suspicion for infectious etiology at this time.  Monitor off antibiotics.  White count downtrending over interval.  Normalized as of 12/2   Dyslipidemia Managed by diet    DVT prophylaxis: SQ Lovenox Code Status: Full Family Communication: Attempted to call son Sharyl Nimrod [phone] on 11/30.  No answer.  Voicemail left. Disposition Plan: Status is: Inpatient Remains inpatient appropriate because: Intractable nausea and vomiting   Level of care: Med-Surg  Consultants:  General surgery  Procedures:  None  Antimicrobials: None   Subjective: Seen and examined.  Lying in bed.  Mild distress.  Objective: Vitals:   11/25/23 1751 11/25/23 2116 11/26/23 0312 11/26/23 0800  BP: (!) 150/81 (!) 148/67 (!) 156/66 (!) 163/89  Pulse: 76 80 76 72  Resp: 16 18 18 18   Temp: 98.6 F (37 C) 98.8 F (37.1 C) 99.3 F (37.4 C) 98.8 F (37.1 C)  TempSrc: Oral Oral Oral Oral  SpO2: 98% 96% 97% 98%  Weight:      Height:        Intake/Output Summary (Last 24 hours) at 11/26/2023 1312 Last data filed at 11/26/2023 5621 Gross per 24 hour  Intake 1326.85 ml  Output 170 ml  Net 1156.85 ml   Filed Weights   11/22/23 1648 11/23/23 1759  Weight: 62.6 kg 57.1 kg    Examination:  General exam: Appears fatigued Respiratory system: Clear to auscultation. Respiratory effort normal. Cardiovascular system: S2, RRR, no murmurs, no pedal edema Gastrointestinal system: Soft, nondistended, tenderness to even light palpation  Central nervous system: Alert and oriented. No focal neurological deficits. Extremities: Symmetric 5 x 5 power. Skin: No rashes, lesions or ulcers Psychiatry: Judgement and insight appear normal. Mood &  affect appropriate.     Data Reviewed: I have personally reviewed following labs and imaging studies  CBC: Recent Labs  Lab 11/22/23 1702 11/23/23 0614 11/24/23 0824 11/25/23 0536 11/26/23 0841  WBC 13.9* 14.5* 15.0* 12.0* 8.5  NEUTROABS  --   --  12.8* 9.7*  6.4  HGB 13.2 11.2* 12.3* 10.9* 10.6*  HCT 42.3 35.2* 38.7* 34.6* 33.2*  MCV 72.1* 70.0* 69.4* 69.9* 69.6*  PLT 373 326 308 281 281   Basic Metabolic Panel: Recent Labs  Lab 11/22/23 1702 11/23/23 0614 11/24/23 0824 11/25/23 0536 11/26/23 0841  NA 137 138 135 138 138  K 4.6 4.0 4.0 3.7 3.6  CL 99 100 99 103 103  CO2 27 26 25 24 25   GLUCOSE 134* 96 96 79 84  BUN 16 14 15 17 13   CREATININE 1.00 0.96 0.88 0.77 0.76  CALCIUM 9.3 9.0 9.0 8.5* 8.5*   GFR: Estimated Creatinine Clearance: 66.4 mL/min (by C-G formula based on SCr of 0.76 mg/dL). Liver Function Tests: Recent Labs  Lab 11/22/23 1702  AST 30  ALT 13  ALKPHOS 85  BILITOT 1.0  PROT 8.1  ALBUMIN 4.4   Recent Labs  Lab 11/22/23 1702 11/23/23 0614  LIPASE 397* 143*   No results for input(s): "AMMONIA" in the last 168 hours. Coagulation Profile: No results for input(s): "INR", "PROTIME" in the last 168 hours. Cardiac Enzymes: No results for input(s): "CKTOTAL", "CKMB", "CKMBINDEX", "TROPONINI" in the last 168 hours. BNP (last 3 results) No results for input(s): "PROBNP" in the last 8760 hours. HbA1C: No results for input(s): "HGBA1C" in the last 72 hours. CBG: Recent Labs  Lab 11/24/23 2337 11/25/23 1200 11/25/23 1719 11/25/23 2355 11/26/23 0633  GLUCAP 93 80 90 99 86   Lipid Profile: No results for input(s): "CHOL", "HDL", "LDLCALC", "TRIG", "CHOLHDL", "LDLDIRECT" in the last 72 hours. Thyroid Function Tests: No results for input(s): "TSH", "T4TOTAL", "FREET4", "T3FREE", "THYROIDAB" in the last 72 hours. Anemia Panel: No results for input(s): "VITAMINB12", "FOLATE", "FERRITIN", "TIBC", "IRON", "RETICCTPCT" in the last 72 hours. Sepsis Labs: No results for input(s): "PROCALCITON", "LATICACIDVEN" in the last 168 hours.  No results found for this or any previous visit (from the past 240 hour(s)).       Radiology Studies: DG Abd 1 View  Result Date: 11/25/2023 CLINICAL DATA:  Abdominal  distension.  Postoperative ileus EXAM: ABDOMEN - 1 VIEW COMPARISON:  Abdominal CT 11/22/2023. FINDINGS: Diffuse gas within the colon without over distension. Stool is seen proximally. No small bowel dilatation is seen. Mild opacity at the right base overlapping the diaphragm, presumably atelectasis given interval development. Left acetabular ossicle and lumbar spine degeneration. IMPRESSION: Diffuse colonic gas with mild stool without worrisome over distension. Electronically Signed   By: Tiburcio Pea M.D.   On: 11/25/2023 09:27        Scheduled Meds:  calcium-vitamin D  1 tablet Oral Daily   docusate sodium  100 mg Oral Daily   enoxaparin (LOVENOX) injection  40 mg Subcutaneous Q24H   folic acid  0.5 mg Oral Daily   multivitamin with minerals  1 tablet Oral Daily   polyethylene glycol  17 g Oral BID   senna-docusate  1 tablet Oral QHS   vitamin B-12  100 mcg Oral Daily   Continuous Infusions:  sodium chloride     promethazine (PHENERGAN) injection (IM or IVPB) Stopped (11/24/23 0344)     LOS: 3 days     Tresa Moore, MD [facility]  If 7PM-7AM, please contact night-coverage  11/26/2023, 1:12 PM

## 2023-11-26 NOTE — Plan of Care (Signed)
  Problem: Education: Goal: Knowledge of General Education information will improve Description Including pain rating scale, medication(s)/side effects and non-pharmacologic comfort measures Outcome: Progressing   Problem: Health Behavior/Discharge Planning: Goal: Ability to manage health-related needs will improve Outcome: Progressing   

## 2023-11-26 NOTE — Progress Notes (Signed)
CC: Post-Operative Ileus Subjective: Patient reports improved pain this morning, however he indicates he has not touched his clear liquid tray because he fell asleep right after he received his parenteral pain medication.  He reports he cannot tolerate pills. He reports some flatus.  He does not feel bloated, denies distention.  His leukocytosis has normalized.  Objective: Vital signs in last 24 hours: Temp:  [98.6 F (37 C)-99.3 F (37.4 C)] 98.8 F (37.1 C) (12/02 0800) Pulse Rate:  [72-80] 72 (12/02 0800) Resp:  [16-18] 18 (12/02 0800) BP: (148-163)/(66-89) 163/89 (12/02 0800) SpO2:  [96 %-98 %] 98 % (12/02 0800) Last BM Date : 11/20/23  Intake/Output from previous day: 12/01 0701 - 12/02 0700 In: 1795.1 [I.V.:1795.1] Out: 270 [Urine:170; Emesis/NG output:100] Intake/Output this shift: No intake/output data recorded.  Physical exam: He is fairly readily distractible, initially indicates lower suprapubic abdominal pain, but when distracted discussing his bladder function and his ability to void, he is showing no evidence of tenderness.  He is not distended.  Right groin slightly puffy, nothing worrisome.  Lab Results: CBC  Recent Labs    11/25/23 0536 11/26/23 0841  WBC 12.0* 8.5  HGB 10.9* 10.6*  HCT 34.6* 33.2*  PLT 281 281   BMET Recent Labs    11/25/23 0536 11/26/23 0841  NA 138 138  K 3.7 3.6  CL 103 103  CO2 24 25  GLUCOSE 79 84  BUN 17 13  CREATININE 0.77 0.76  CALCIUM 8.5* 8.5*     Studies/Results: DG Abd 1 View  Result Date: 11/25/2023 CLINICAL DATA:  Abdominal distension.  Postoperative ileus EXAM: ABDOMEN - 1 VIEW COMPARISON:  Abdominal CT 11/22/2023. FINDINGS: Diffuse gas within the colon without over distension. Stool is seen proximally. No small bowel dilatation is seen. Mild opacity at the right base overlapping the diaphragm, presumably atelectasis given interval development. Left acetabular ossicle and lumbar spine degeneration. IMPRESSION:  Diffuse colonic gas with mild stool without worrisome over distension. Electronically Signed   By: Tiburcio Pea M.D.   On: 11/25/2023 09:27    Anti-infectives: Anti-infectives (From admission, onward)    None       Assessment/Plan:  The patient is a 72 year old who is status post robotic assisted right inguinal hernia repair, from November 27.  He presented back to the hospital with abdominal pain as well as nausea and vomiting.  He is not distended on exam.  He a bit protective and hyper reactive when you try to examine him.  The KUB just shows that there is stool in the right colon with some distention of his left colon.  It is likely safe to advance his diet,  also upped his bowel regimen to MiraLAX to twice daily.  Along with the added Colace and senna in hopes that this pain is secondary to constipation and that this may help him improve.   [name], M.D., [facility]  11/26/2023 ; 12:07 PM

## 2023-11-27 DIAGNOSIS — R112 Nausea with vomiting, unspecified: Secondary | ICD-10-CM | POA: Diagnosis not present

## 2023-11-27 MED ORDER — OXYCODONE HCL 5 MG PO TABS
5.0000 mg | ORAL_TABLET | ORAL | Status: DC | PRN
  Filled 2023-11-27: qty 2

## 2023-11-27 NOTE — Plan of Care (Signed)

## 2023-11-27 NOTE — Progress Notes (Signed)
[facility] SURGICAL PROGRESS NOTE  Hospital Day(s): 4.   Interval History:  Patient seen and examined No acute events or new complaints overnight.  Patient reports he is feeling better  Continues to have incisional soreness No fever, chills, nausea, emesis No new labs this morning On CLD; tolerating Passing flatus and having stool   Vital signs in last 24 hours: [min-max] current  Temp:  [98 F (36.7 C)-98.8 F (37.1 C)] 98.2 F (36.8 C) (12/03 0412) Pulse Rate:  [71-78] 77 (12/03 0412) Resp:  [16-18] 17 (12/03 0412) BP: (133-163)/(49-89) 159/80 (12/03 0412) SpO2:  [96 %-98 %] 96 % (12/03 0412)     Height: 5\' 8"  (172.7 cm) Weight: 57.1 kg BMI (Calculated): 19.14   Intake/Output last 2 shifts:  12/02 0701 - 12/03 0700 In: 1972.4 [P.O.:1030; I.V.:942.4] Out: 2 [Stool:2]   Physical Exam:  Constitutional: alert, cooperative and no distress  Respiratory: breathing non-labored at rest  Cardiovascular: regular rate and sinus rhythm  Gastrointestinal: soft, incisional soreness, and non-distended Integumentary: Laparoscopic incisions are CDI with dermabond, no erythema or drainage   Labs:     Latest Ref Rng & Units 11/26/2023    8:41 AM 11/25/2023    5:36 AM 11/24/2023    8:24 AM  CBC  WBC 4.0 - 10.5 K/uL 8.5  12.0  15.0   Hemoglobin 13.0 - 17.0 g/dL 16.1  09.6  04.5   Hematocrit 39.0 - 52.0 % 33.2  34.6  38.7   Platelets 150 - 400 K/uL 281  281  308       Latest Ref Rng & Units 11/26/2023    8:41 AM 11/25/2023    5:36 AM 11/24/2023    8:24 AM  CMP  Glucose 70 - 99 mg/dL 84  79  96   BUN 8 - 23 mg/dL 13  17  15    Creatinine 0.61 - 1.24 mg/dL 4.09  8.11  9.14   Sodium 135 - 145 mmol/L 138  138  135   Potassium 3.5 - 5.1 mmol/L 3.6  3.7  4.0   Chloride 98 - 111 mmol/L 103  103  99   CO2 22 - 32 mmol/L 25  24  25    Calcium 8.9 - 10.3 mg/dL 8.5  8.5  9.0      Imaging studies: No new pertinent imaging studies   Assessment/Plan:  72 y.o. male now  with clinically improving ileus s/p robotic assisted laparoscopic right inguinal hernia repair on 11/27   - Okay to advance diet as tolerated; I will order soft diet for lunch time   - Monitor abdominal examination; on-going bowel function   - Pain control prn; antiemetics prn - Mobilize as tolerated - Further management per primary team; we will follow     - Discharge Planning: He is doing well with ROBF. Will advance diet today. If he is able to tolerate diet, can DC home this PM vs tomorrow morning. He has established follow up on 12/12.   All of the above findings and recommendations were discussed with the patient, and the medical team, and all of patient's questions were answered to his expressed satisfaction.  -- Lynden Oxford, PA-C [facility] 11/27/2023, 7:44 AM M-F: 7am - 4pm

## 2023-11-27 NOTE — Progress Notes (Signed)
PROGRESS NOTE    Jerry Pena  JYN:[id number] DOB: Jun 27, 1951 DOA: 11/22/2023 PCP: Berniece Salines, FNP    Brief Narrative:  72 y.o. African-American male with medical history significant for prostate cancer status post prostatectomy, dyslipidemia, GERD, DDD, s/p laparoscopic right inguinal hernia repair by Dr. Claudine Mouton here at [facility] yesterday he was been having significant pain since his surgery yesterday with associated nausea and vomiting.  He denies any hematemesis or coffee-ground emesis.  It has been occasionally greenish though.  He has not been able to keep anything down since his surgery.  He continues to pass flatus.  No dysuria or oliguria or hematuria or flank pain.  No cough or wheezing or dyspnea.  No chest pain or palpitations.  No fever but he has been having occasional chills.    Assessment & Plan:   Principal Problem:   Intractable nausea and vomiting Active Problems:   Idiopathic acute pancreatitis   Leukocytosis   Dyslipidemia  Intractable nausea and vomiting Patient seen in consultation by general surgery.  Discussed findings of CT scan.  Working diagnosis is air seen in right groin on CAT scan and secondary to residual insufflation and possibly a postoperative ileus from anesthesia and narcotic use.. Still with significant abdominal pain that is difficult to ascertain etiology.  Patient endorses a substantial pain even with very light palpation of his abdomen.  KUB referred by general surgery does demonstrate a moderate stool burden Plan: Patient is clinically improving.  Diet advance as tolerated.  Soft diet ordered per general surgery.  Patient is now having return of bowel function with flatus and BM.  Mobilize as tolerated.  If able to tolerate diet and pain controlled with oral medications can DC home 12/4.  Pancreatitis, ruled out Suspect patient's symptoms are more secondary to recent surgical intervention and postoperative ileus.  Low suspicion for acute  pancreatitis.  CT also reassuring. Plan: Diet advancement as above.  Monitor abdominal exam  Leukocytosis Suspect stress reaction.  Low suspicion for infectious etiology at this time.  Monitor off antibiotics.  White count downtrending over interval.  Normalized as of 12/2   Dyslipidemia Managed by diet    DVT prophylaxis: SQ Lovenox Code Status: Full Family Communication: Attempted to call son Sharyl Nimrod [phone] on 11/30, 12/3.  No answer.  Voicemail left. Disposition Plan: Status is: Inpatient Remains inpatient appropriate because: Intractable nausea and vomiting   Level of care: Med-Surg  Consultants:  General surgery  Procedures:  None  Antimicrobials: None   Subjective: Seen and examined.  Still in pain but improving  Objective: Vitals:   11/26/23 1554 11/26/23 2008 11/27/23 0412 11/27/23 0912  BP: (!) 133/49 136/73 (!) 159/80 (!) 150/75  Pulse: 78 71 77 71  Resp: 16 18 17 18   Temp: 98 F (36.7 C) 98.5 F (36.9 C) 98.2 F (36.8 C) 98.6 F (37 C)  TempSrc: Oral Oral Oral Oral  SpO2: 96% 96% 96% 95%  Weight:      Height:        Intake/Output Summary (Last 24 hours) at 11/27/2023 1338 Last data filed at 11/27/2023 1015 Gross per 24 hour  Intake 1972.39 ml  Output 92 ml  Net 1880.39 ml   Filed Weights   11/22/23 1648 11/23/23 1759  Weight: 62.6 kg 57.1 kg    Examination:  General exam: No acute distress Respiratory system: Clear to auscultation. Respiratory effort normal. Cardiovascular system: S2, RRR, no murmurs, no pedal edema Gastrointestinal system: Soft, nondistended, mild tenderness to palpation  Central nervous system: Alert and oriented. No focal neurological deficits. Extremities: Symmetric 5 x 5 power. Skin: No rashes, lesions or ulcers Psychiatry: Judgement and insight appear normal. Mood & affect appropriate.     Data Reviewed: I have personally reviewed following labs and imaging studies  CBC: Recent Labs  Lab 11/22/23 1702  11/23/23 0614 11/24/23 0824 11/25/23 0536 11/26/23 0841  WBC 13.9* 14.5* 15.0* 12.0* 8.5  NEUTROABS  --   --  12.8* 9.7* 6.4  HGB 13.2 11.2* 12.3* 10.9* 10.6*  HCT 42.3 35.2* 38.7* 34.6* 33.2*  MCV 72.1* 70.0* 69.4* 69.9* 69.6*  PLT 373 326 308 281 281   Basic Metabolic Panel: Recent Labs  Lab 11/22/23 1702 11/23/23 0614 11/24/23 0824 11/25/23 0536 11/26/23 0841  NA 137 138 135 138 138  K 4.6 4.0 4.0 3.7 3.6  CL 99 100 99 103 103  CO2 27 26 25 24 25   GLUCOSE 134* 96 96 79 84  BUN 16 14 15 17 13   CREATININE 1.00 0.96 0.88 0.77 0.76  CALCIUM 9.3 9.0 9.0 8.5* 8.5*   GFR: Estimated Creatinine Clearance: 66.4 mL/min (by C-G formula based on SCr of 0.76 mg/dL). Liver Function Tests: Recent Labs  Lab 11/22/23 1702  AST 30  ALT 13  ALKPHOS 85  BILITOT 1.0  PROT 8.1  ALBUMIN 4.4   Recent Labs  Lab 11/22/23 1702 11/23/23 0614  LIPASE 397* 143*   No results for input(s): "AMMONIA" in the last 168 hours. Coagulation Profile: No results for input(s): "INR", "PROTIME" in the last 168 hours. Cardiac Enzymes: No results for input(s): "CKTOTAL", "CKMB", "CKMBINDEX", "TROPONINI" in the last 168 hours. BNP (last 3 results) No results for input(s): "PROBNP" in the last 8760 hours. HbA1C: No results for input(s): "HGBA1C" in the last 72 hours. CBG: Recent Labs  Lab 11/24/23 2337 11/25/23 1200 11/25/23 1719 11/25/23 2355 11/26/23 0633  GLUCAP 93 80 90 99 86   Lipid Profile: No results for input(s): "CHOL", "HDL", "LDLCALC", "TRIG", "CHOLHDL", "LDLDIRECT" in the last 72 hours. Thyroid Function Tests: No results for input(s): "TSH", "T4TOTAL", "FREET4", "T3FREE", "THYROIDAB" in the last 72 hours. Anemia Panel: No results for input(s): "VITAMINB12", "FOLATE", "FERRITIN", "TIBC", "IRON", "RETICCTPCT" in the last 72 hours. Sepsis Labs: No results for input(s): "PROCALCITON", "LATICACIDVEN" in the last 168 hours.  No results found for this or any previous visit (from  the past 240 hour(s)).       Radiology Studies: No results found.      Scheduled Meds:  calcium-vitamin D  1 tablet Oral Daily   docusate sodium  100 mg Oral Daily   enoxaparin (LOVENOX) injection  40 mg Subcutaneous Q24H   folic acid  0.5 mg Oral Daily   multivitamin with minerals  1 tablet Oral Daily   polyethylene glycol  17 g Oral BID   senna-docusate  1 tablet Oral QHS   vitamin B-12  100 mcg Oral Daily   Continuous Infusions:  promethazine (PHENERGAN) injection (IM or IVPB) Stopped (11/24/23 0344)     LOS: 4 days     Tresa Moore, MD [facility]   If 7PM-7AM, please contact night-coverage  11/27/2023, 1:38 PM

## 2023-11-28 DIAGNOSIS — R112 Nausea with vomiting, unspecified: Secondary | ICD-10-CM | POA: Diagnosis not present

## 2023-11-28 LAB — CBC WITH DIFFERENTIAL/PLATELET
Abs Immature Granulocytes: 0.02 10*3/uL (ref 0.00–0.07)
Basophils Absolute: 0 10*3/uL (ref 0.0–0.1)
Basophils Relative: 1 %
Eosinophils Absolute: 0.1 10*3/uL (ref 0.0–0.5)
Eosinophils Relative: 1 %
HCT: 35.6 % — ABNORMAL LOW (ref 39.0–52.0)
Hemoglobin: 11.5 g/dL — ABNORMAL LOW (ref 13.0–17.0)
Immature Granulocytes: 0 %
Lymphocytes Relative: 20 %
Lymphs Abs: 1.4 10*3/uL (ref 0.7–4.0)
MCH: 22.5 pg — ABNORMAL LOW (ref 26.0–34.0)
MCHC: 32.3 g/dL (ref 30.0–36.0)
MCV: 69.8 fL — ABNORMAL LOW (ref 80.0–100.0)
Monocytes Absolute: 0.7 10*3/uL (ref 0.1–1.0)
Monocytes Relative: 10 %
Neutro Abs: 4.7 10*3/uL (ref 1.7–7.7)
Neutrophils Relative %: 68 %
Platelets: 345 10*3/uL (ref 150–400)
RBC: 5.1 MIL/uL (ref 4.22–5.81)
RDW: 15.4 % (ref 11.5–15.5)
WBC: 6.9 10*3/uL (ref 4.0–10.5)
nRBC: 0 % (ref 0.0–0.2)

## 2023-11-28 LAB — BASIC METABOLIC PANEL
Anion gap: 11 (ref 5–15)
BUN: 13 mg/dL (ref 8–23)
CO2: 25 mmol/L (ref 22–32)
Calcium: 8.9 mg/dL (ref 8.9–10.3)
Chloride: 104 mmol/L (ref 98–111)
Creatinine, Ser: 0.8 mg/dL (ref 0.61–1.24)
GFR, Estimated: >60 mL/min (ref >60)
Glucose, Bld: 86 mg/dL (ref 70–99)
Potassium: 4.1 mmol/L (ref 3.5–5.1)
Sodium: 140 mmol/L (ref 135–145)

## 2023-11-28 NOTE — Progress Notes (Signed)
[facility] SURGICAL PROGRESS NOTE  Hospital Day(s): 5.   Interval History:  Patient seen and examined No acute events or new complaints overnight.  Patient reports he is feeling better Continues to have incisional soreness; improved No fever, chills, nausea, emesis Labs this morning are reassuring On soft diet; tolerating Passing flatus and having stool x4  Vital signs in last 24 hours: [min-max] current  Temp:  [98.2 F (36.8 C)-98.9 F (37.2 C)] 98.2 F (36.8 C) (12/04 0315) Pulse Rate:  [71-84] 84 (12/04 0315) Resp:  [17-19] 18 (12/04 0315) BP: (150-161)/(75-85) 158/85 (12/04 0315) SpO2:  [95 %-99 %] 99 % (12/04 0315)     Height: 5\' 8"  (172.7 cm) Weight: 57.1 kg BMI (Calculated): 19.14   Intake/Output last 2 shifts:  12/03 0701 - 12/04 0700 In: 0  Out: 90 [Urine:90]   Physical Exam:  Constitutional: alert, cooperative and no distress  Respiratory: breathing non-labored at rest  Cardiovascular: regular rate and sinus rhythm  Gastrointestinal: soft, incisional soreness, and non-distended Integumentary: Laparoscopic incisions are CDI with dermabond, no erythema or drainage   Labs:     Latest Ref Rng & Units 11/28/2023    6:05 AM 11/26/2023    8:41 AM 11/25/2023    5:36 AM  CBC  WBC 4.0 - 10.5 K/uL 6.9  8.5  12.0   Hemoglobin 13.0 - 17.0 g/dL 95.2  84.1  32.4   Hematocrit 39.0 - 52.0 % 35.6  33.2  34.6   Platelets 150 - 400 K/uL 345  281  281       Latest Ref Rng & Units 11/28/2023    6:05 AM 11/26/2023    8:41 AM 11/25/2023    5:36 AM  CMP  Glucose 70 - 99 mg/dL 86  84  79   BUN 8 - 23 mg/dL 13  13  17    Creatinine 0.61 - 1.24 mg/dL 4.01  0.27  2.53   Sodium 135 - 145 mmol/L 140  138  138   Potassium 3.5 - 5.1 mmol/L 4.1  3.6  3.7   Chloride 98 - 111 mmol/L 104  103  103   CO2 22 - 32 mmol/L 25  25  24    Calcium 8.9 - 10.3 mg/dL 8.9  8.5  8.5      Imaging studies: No new pertinent imaging studies   Assessment/Plan:  72 y.o. male now  with clinically improving ileus s/p robotic assisted laparoscopic right inguinal hernia repair on 11/27   - Okay for diet as tolerated  - Monitor abdominal examination; on-going bowel function   - Pain control prn; antiemetics prn - Mobilize as tolerated - Further management per primary team   - Discharge Planning: He is doing well with ROBF. Can DC home today from surgical perspective. He has established follow up on 12/12.   All of the above findings and recommendations were discussed with the patient, and the medical team, and all of patient's questions were answered to his expressed satisfaction.  -- Lynden Oxford, PA-C [facility] 11/28/2023, 7:39 AM M-F: 7am - 4pm

## 2023-11-28 NOTE — Progress Notes (Signed)
PROGRESS NOTE    Jerry Pena  ZOX:[id number] DOB: Jan 02, 1951 DOA: 11/22/2023 PCP: Berniece Salines, FNP  223A/223A-AA  LOS: 5 days   Brief hospital course:   Assessment & Plan: 72 y.o. African-American male with medical history significant for prostate cancer status post prostatectomy, dyslipidemia, GERD, DDD, s/p laparoscopic right inguinal hernia repair by Dr. Claudine Mouton here at [facility] yesterday he was been having significant pain since his surgery yesterday with associated nausea and vomiting.  He denies any hematemesis or coffee-ground emesis.  It has been occasionally greenish though.  He has not been able to keep anything down since his surgery.  He continues to pass flatus.  No dysuria or oliguria or hematuria or flank pain.  No cough or wheezing or dyspnea.  No chest pain or palpitations.  No fever but he has been having occasional chills.   nausea and vomiting and abdominal pain 2/2 Ileus --Pt had robotic assisted laparoscopic right inguinal hernia repair on 11/27.  Patient seen in consultation by general surgery.  Discussed findings of CT scan.  Working diagnosis is air seen in right groin on CAT scan and secondary to residual insufflation and possibly a postoperative ileus from anesthesia and narcotic use.. Still with significant abdominal pain that is difficult to ascertain etiology.  Patient endorses a substantial pain even with very light palpation of his abdomen.  KUB referred by general surgery does demonstrate a moderate stool burden --pain improved, pt tolerating liquid diet and having BM's. Plan: --advance to soft diet --follow up on 12/12 with GenSurg   Pancreatitis, ruled out Suspect patient's symptoms are more secondary to recent surgical intervention and postoperative ileus.  Low suspicion for acute pancreatitis.  CT also reassuring.   Leukocytosis Suspect stress reaction.  Low suspicion for infectious etiology at this time.  Monitor off antibiotics.  White count  downtrending over interval.  Normalized as of 12/2   Dyslipidemia Managed by diet    DVT prophylaxis: Lovenox SQ Code Status: Full code  Family Communication:  Level of care: Med-Surg Dispo:   The patient is from: home Anticipated d/c is to: home Anticipated d/c date is: tomorrow   Subjective and Interval History:  Pt tolerated liquid, but RN reported pt had a hard time soft solid today.  Pt did not feel comfortable going home.   Objective: Vitals:   11/27/23 2001 11/28/23 0315 11/28/23 0820 11/28/23 1558  BP: (!) 151/82 (!) 158/85 (!) 145/82 (!) 143/81  Pulse: 75 84 90 82  Resp: 19 18 18 18   Temp: 98.3 F (36.8 C) 98.2 F (36.8 C) 98 F (36.7 C) 98.4 F (36.9 C)  TempSrc: Oral Oral Oral   SpO2: 97% 99% 99% 96%  Weight:      Height:        Intake/Output Summary (Last 24 hours) at 11/28/2023 1956 Last data filed at 11/27/2023 2100 Gross per 24 hour  Intake 0 ml  Output --  Net 0 ml   Filed Weights   11/22/23 1648 11/23/23 1759  Weight: 62.6 kg 57.1 kg    Examination:   Constitutional: NAD, AAOx3 HEENT: conjunctivae and lids normal, EOMI CV: No cyanosis.   RESP: normal respiratory effort, on RA Neuro: II - XII grossly intact.   Psych: Normal mood and affect.  Appropriate judgement and reason   Data Reviewed: I have personally reviewed labs and imaging studies  Time spent: 35 minutes  Darlin Priestly, MD [facility] If 7PM-7AM, please contact night-coverage 11/28/2023, 7:56 PM

## 2023-11-29 DIAGNOSIS — R112 Nausea with vomiting, unspecified: Secondary | ICD-10-CM | POA: Diagnosis not present

## 2023-11-29 NOTE — Care Management Important Message (Signed)
Important Message  Patient Details  Name: NIHAAN BRILLA MRN: [id number] Date of Birth: 04-Oct-1951   Important Message Given:  Yes - Medicare IM     Verita Schneiders Mattalyn Anderegg 11/29/2023, 2:49 PM

## 2023-11-29 NOTE — Plan of Care (Signed)
Patient slept entire night, no request for pain medication during the night. Patient should be discharged today. Problem: Education: Goal: Knowledge of General Education information will improve Description: Including pain rating scale, medication(s)/side effects and non-pharmacologic comfort measures Outcome: Progressing   Problem: Health Behavior/Discharge Planning: Goal: Ability to manage health-related needs will improve Outcome: Progressing   Problem: Clinical Measurements: Goal: Ability to maintain clinical measurements within normal limits will improve Outcome: Progressing Goal: Will remain free from infection Outcome: Progressing Goal: Diagnostic test results will improve Outcome: Progressing Goal: Respiratory complications will improve Outcome: Progressing Goal: Cardiovascular complication will be avoided Outcome: Progressing   Problem: Activity: Goal: Risk for activity intolerance will decrease Outcome: Progressing   Problem: Nutrition: Goal: Adequate nutrition will be maintained Outcome: Progressing   Problem: Coping: Goal: Level of anxiety will decrease Outcome: Progressing   Problem: Elimination: Goal: Will not experience complications related to bowel motility Outcome: Progressing Goal: Will not experience complications related to urinary retention Outcome: Progressing   Problem: Pain Management: Goal: General experience of comfort will improve Outcome: Progressing   Problem: Safety: Goal: Ability to remain free from injury will improve Outcome: Progressing   Problem: Skin Integrity: Goal: Risk for impaired skin integrity will decrease Outcome: Progressing

## 2023-11-29 NOTE — Progress Notes (Signed)
Patient refused all his morning medications, education provided, MD aware

## 2023-11-29 NOTE — Progress Notes (Signed)
  PROGRESS NOTE    Jerry Pena  ZOX:[id number] DOB: Dec 16, 1951 DOA: 11/22/2023 PCP: Berniece Salines, FNP  223A/223A-AA  LOS: 6 days   Brief hospital course:   Assessment & Plan: 72 y.o. African-American male with medical history significant for prostate cancer status post prostatectomy, dyslipidemia, GERD, DDD, s/p laparoscopic right inguinal hernia repair by Dr. Claudine Mouton here at [facility] yesterday he was been having significant pain since his surgery yesterday with associated nausea and vomiting.  He denies any hematemesis or coffee-ground emesis.  It has been occasionally greenish though.  He has not been able to keep anything down since his surgery.  He continues to pass flatus.  No dysuria or oliguria or hematuria or flank pain.  No cough or wheezing or dyspnea.  No chest pain or palpitations.  No fever but he has been having occasional chills.   nausea and vomiting and abdominal pain 2/2 Ileus --Pt had robotic assisted laparoscopic right inguinal hernia repair on 11/27.  Patient seen in consultation by general surgery.  Discussed findings of CT scan.  Working diagnosis is air seen in right groin on CAT scan and secondary to residual insufflation and possibly a postoperative ileus from anesthesia and narcotic use.. Still with significant abdominal pain that is difficult to ascertain etiology.  Patient endorses a substantial pain even with very light palpation of his abdomen.  KUB referred by general surgery does demonstrate a moderate stool burden --pain improved, pt tolerating liquid diet and having BM's. Plan: --cont soft diet --follow up on 12/12 with GenSurg   Pancreatitis, ruled out Suspect patient's symptoms are more secondary to recent surgical intervention and postoperative ileus.  Low suspicion for acute pancreatitis.  CT also reassuring.   Leukocytosis Suspect stress reaction.  Low suspicion for infectious etiology at this time.  Monitor off antibiotics.  White count  downtrending over interval.  Normalized as of 12/2   Dyslipidemia Managed by diet    DVT prophylaxis: Lovenox SQ Code Status: Full code  Family Communication:  Level of care: Med-Surg Dispo:   The patient is from: home Anticipated d/c is to: home Anticipated d/c date is: tomorrow   Subjective and Interval History:  Pt reported he couldn't eat much solid food.   Objective: Vitals:   11/28/23 2238 11/29/23 0838 11/29/23 1640 11/29/23 2029  BP: (!) 140/86 (!) 140/76 (!) 143/80 119/76  Pulse: 73 74 69 61  Resp:    20  Temp: 98 F (36.7 C) 98.1 F (36.7 C) 98.5 F (36.9 C) 98 F (36.7 C)  TempSrc: Oral Oral  Oral  SpO2: 99% 99% 99% 96%  Weight:      Height:       No intake or output data in the 24 hours ending 11/29/23 2031  Filed Weights   11/22/23 1648 11/23/23 1759  Weight: 62.6 kg 57.1 kg    Examination:   Constitutional: NAD, AAOx3 HEENT: conjunctivae and lids normal, EOMI CV: No cyanosis.   RESP: normal respiratory effort, on RA Neuro: II - XII grossly intact.   Psych: Normal mood and affect.  Appropriate judgement and reason   Data Reviewed: I have personally reviewed labs and imaging studies  Time spent: 25 minutes  Darlin Priestly, MD [facility] If 7PM-7AM, please contact night-coverage 11/29/2023, 8:31 PM

## 2023-11-29 NOTE — Plan of Care (Signed)

## 2023-11-30 DIAGNOSIS — R112 Nausea with vomiting, unspecified: Secondary | ICD-10-CM | POA: Diagnosis not present

## 2023-11-30 NOTE — Plan of Care (Signed)
  Problem: Education: Goal: Knowledge of General Education information will improve Description: Including pain rating scale, medication(s)/side effects and non-pharmacologic comfort measures 11/30/2023 1046 by Dillard Essex, RN Outcome: Completed/Met 11/30/2023 1034 by Dillard Essex, RN Outcome: Progressing   Problem: Health Behavior/Discharge Planning: Goal: Ability to manage health-related needs will improve 11/30/2023 1046 by Dillard Essex, RN Outcome: Completed/Met 11/30/2023 1034 by Dillard Essex, RN Outcome: Progressing   Problem: Clinical Measurements: Goal: Ability to maintain clinical measurements within normal limits will improve 11/30/2023 1046 by Dillard Essex, RN Outcome: Completed/Met 11/30/2023 1034 by Dillard Essex, RN Outcome: Progressing Goal: Will remain free from infection 11/30/2023 1046 by Dillard Essex, RN Outcome: Completed/Met 11/30/2023 1034 by Dillard Essex, RN Outcome: Progressing Goal: Diagnostic test results will improve 11/30/2023 1046 by Dillard Essex, RN Outcome: Completed/Met 11/30/2023 1034 by Dillard Essex, RN Outcome: Progressing Goal: Respiratory complications will improve 11/30/2023 1046 by Dillard Essex, RN Outcome: Completed/Met 11/30/2023 1034 by Dillard Essex, RN Outcome: Progressing Goal: Cardiovascular complication will be avoided 11/30/2023 1046 by Dillard Essex, RN Outcome: Completed/Met 11/30/2023 1034 by Dillard Essex, RN Outcome: Progressing   Problem: Activity: Goal: Risk for activity intolerance will decrease 11/30/2023 1046 by Dillard Essex, RN Outcome: Completed/Met 11/30/2023 1034 by Dillard Essex, RN Outcome: Progressing   Problem: Nutrition: Goal: Adequate nutrition will be maintained 11/30/2023 1046 by Dillard Essex, RN Outcome: Completed/Met 11/30/2023 1034 by Dillard Essex, RN Outcome: Progressing   Problem: Coping: Goal: Level of anxiety will decrease 11/30/2023  1046 by Dillard Essex, RN Outcome: Completed/Met 11/30/2023 1034 by Dillard Essex, RN Outcome: Progressing   Problem: Elimination: Goal: Will not experience complications related to bowel motility 11/30/2023 1046 by Dillard Essex, RN Outcome: Completed/Met 11/30/2023 1034 by Dillard Essex, RN Outcome: Progressing Goal: Will not experience complications related to urinary retention 11/30/2023 1046 by Dillard Essex, RN Outcome: Completed/Met 11/30/2023 1034 by Dillard Essex, RN Outcome: Progressing   Problem: Pain Management: Goal: General experience of comfort will improve 11/30/2023 1046 by Dillard Essex, RN Outcome: Completed/Met 11/30/2023 1034 by Dillard Essex, RN Outcome: Progressing   Problem: Safety: Goal: Ability to remain free from injury will improve 11/30/2023 1046 by Dillard Essex, RN Outcome: Completed/Met 11/30/2023 1034 by Dillard Essex, RN Outcome: Progressing   Problem: Skin Integrity: Goal: Risk for impaired skin integrity will decrease 11/30/2023 1046 by Dillard Essex, RN Outcome: Completed/Met 11/30/2023 1034 by Dillard Essex, RN Outcome: Progressing

## 2023-11-30 NOTE — TOC CM/SW Note (Signed)
Cab voucher provided to the home.  Alfonso Ramus, LCSW Transitions of Care Department [phone]

## 2023-11-30 NOTE — Discharge Summary (Signed)
Physician Discharge Summary   HUNT WALTZER  male DOB: 12-04-1951  GNF:[id number]  PCP: Berniece Salines, FNP  Admit date: 11/22/2023 Discharge date: 11/30/2023  Admitted From: home Disposition:  home CODE STATUS: Full code   Hospital Course:  For full details, please see H&P, progress notes, consult notes and ancillary notes.  Briefly,   EXAVIER MCANNALLY is a 72 y.o. African-American male with medical history significant for prostate cancer status post prostatectomy, s/p laparoscopic right inguinal hernia repair by Dr. Claudine Mouton here at [facility] on 11/21/23 who presented with significant pain since his surgery with associated nausea and vomiting.    nausea and vomiting and abdominal pain 2/2 Ileus --Pt had robotic assisted laparoscopic right inguinal hernia repair on 11/27.  Patient seen in consultation by general surgery.  Working diagnosis is postoperative ileus from anesthesia and narcotic use.  Patient endorsed a substantial pain even with very light palpation of his abdomen.  KUB demonstrated a moderate stool burden --pain did improve, pt tolerating soft diet and having BM's prior to discharge. --outpatient follow up on 12/12 with GenSurg   Pancreatitis, ruled out Low suspicion for acute pancreatitis.  CT also reassuring.   Leukocytosis Suspect stress reaction.  Low suspicion for infectious etiology at this time.  Monitor off antibiotics.  White count downtrending over interval.  Normalized as of 12/2   Dyslipidemia Managed by diet   Discharge Diagnoses:  Principal Problem:   Intractable nausea and vomiting Active Problems:   Idiopathic acute pancreatitis   Leukocytosis   Dyslipidemia   30 Day Unplanned Readmission Risk Score    Flowsheet Row ED to Hosp-Admission (Current) from 11/22/20[facility] in [facility]  30 Day Unplanned Readmission Risk Score (%) 8.7 Filed at 11/30/2023 0801       This score is the patient's risk of an  unplanned readmission within 30 days of being discharged (0 -100%). The score is based on dignosis, age, lab data, medications, orders, and past utilization.   Low:  0-14.9   Medium: 15-21.9   High: 22-29.9   Extreme: 30 and above         Discharge Instructions:  Allergies as of 11/30/2023       Reactions   Aspirin Other (See Comments)   Upset stomach, indigestion and heart burn   Penicillins Hives, Other (See Comments)   Has patient had a PCN reaction causing immediate rash, facial/tongue/throat swelling, SOB or lightheadedness with hypotension: Yes Has patient had a PCN reaction causing severe rash involving mucus membranes or skin necrosis: No Has patient had a PCN reaction that required hospitalization: Yes Has patient had a PCN reaction occurring within the last 10 years: No If all of the above answers are "NO", then may proceed with Cephalosporin use.        Medication List     STOP taking these medications    HYDROcodone-acetaminophen 5-325 MG tablet Commonly known as: NORCO/VICODIN       TAKE these medications    ascorbic acid 500 MG tablet Commonly known as: VITAMIN C Take 500 mg by mouth daily.   calcium-vitamin D 500-200 MG-UNIT tablet Commonly known as: OSCAL WITH D Take 1 tablet by mouth daily.   folic acid 400 MCG tablet Commonly known as: FOLVITE Take 1 tablet (400 mcg total) by mouth daily.   ibuprofen 200 MG tablet Commonly known as: ADVIL Take 2 tablets (400 mg total) by mouth every 6 (six) hours as needed for headache or moderate  pain (pain score 4-6).   melatonin 5 MG Tabs Take 10 mg by mouth daily as needed (Sleep).   multivitamin tablet Take 1 tablet by mouth daily.   polyethylene glycol 17 g packet Commonly known as: MIRALAX / GLYCOLAX Take 17 g by mouth daily.   vitamin B-12 100 MCG tablet Commonly known as: CYANOCOBALAMIN Take 100 mcg by mouth daily.         Follow-up Information     Berniece Salines, FNP Follow up in 1  week(s).   Specialty: Nurse Practitioner Contact information: [address] [place] [address] [phone]                 Allergies  Allergen Reactions   Aspirin Other (See Comments)    Upset stomach, indigestion and heart burn   Penicillins Hives and Other (See Comments)    Has patient had a PCN reaction causing immediate rash, facial/tongue/throat swelling, SOB or lightheadedness with hypotension: Yes Has patient had a PCN reaction causing severe rash involving mucus membranes or skin necrosis: No Has patient had a PCN reaction that required hospitalization: Yes Has patient had a PCN reaction occurring within the last 10 years: No If all of the above answers are "NO", then may proceed with Cephalosporin use.      The results of significant diagnostics from this hospitalization (including imaging, microbiology, ancillary and laboratory) are listed below for reference.   Consultations:   Procedures/Studies: DG Abd 1 View  Result Date: 11/25/2023 CLINICAL DATA:  Abdominal distension.  Postoperative ileus EXAM: ABDOMEN - 1 VIEW COMPARISON:  Abdominal CT 11/22/2023. FINDINGS: Diffuse gas within the colon without over distension. Stool is seen proximally. No small bowel dilatation is seen. Mild opacity at the right base overlapping the diaphragm, presumably atelectasis given interval development. Left acetabular ossicle and lumbar spine degeneration. IMPRESSION: Diffuse colonic gas with mild stool without worrisome over distension. Electronically Signed   By: Tiburcio Pea M.D.   On: 11/25/2023 09:27   US Abdomen Limited RUQ (LIVER/GB)  Result Date: 11/22/2023 CLINICAL DATA:  Acute pancreatitis inguinal hernia repair EXAM: ULTRASOUND ABDOMEN LIMITED RIGHT UPPER QUADRANT COMPARISON:  CT 11/22/2023 FINDINGS: Gallbladder: No gallstones or wall thickening visualized. No sonographic Murphy sign noted by sonographer. Common bile duct: Diameter: 5 mm Liver: No  focal lesion identified. Within normal limits in parenchymal echogenicity. Portal vein is patent on color Doppler imaging with normal direction of blood flow towards the liver. Other: None. IMPRESSION: Negative examination. Electronically Signed   By: Jasmine Pang M.D.   On: 11/22/2023 23:48   CT ABDOMEN PELVIS W CONTRAST  Result Date: 11/22/2023 CLINICAL DATA:  Laparoscopic inguinal hernia repair yesterday. Severe pain vomiting. EXAM: CT ABDOMEN AND PELVIS WITH CONTRAST TECHNIQUE: Multidetector CT imaging of the abdomen and pelvis was performed using the standard protocol following bolus administration of intravenous contrast. RADIATION DOSE REDUCTION: This exam was performed according to the departmental dose-optimization program which includes automated exposure control, adjustment of the mA and/or kV according to patient size and/or use of iterative reconstruction technique. CONTRAST:  OMNIPAQUE IOHEXOL 300 MG/ML  SOLN COMPARISON:  CT of the abdomen and pelvis without contrast 01/14/2021 FINDINGS: Lower chest: The lung bases are clear without focal nodule, mass, or airspace disease. Heart size is normal. No significant pleural or pericardial effusion is present. Hepatobiliary: Choose 1 Pancreas: Mild dilation of pancreatic duct is present. No obstructing lesion is present. The pancreas is otherwise within normal limits. Spleen: Normal in size without  focal abnormality. Adrenals/Urinary Tract: Adrenal glands are unremarkable. Kidneys are normal, without renal calculi, focal lesion, or hydronephrosis. Bladder is unremarkable. Stomach/Bowel: The stomach and duodenum are within normal limits. The small bowel is within normal limits proximally. Fluid and gas extending into the right inguinal canal. This may represent a loop of bowel although direct continuity is unclear. Moderate stool is present throughout the colon. Tiny locules of free air are likely within normal limits following surgery yesterday.  Vascular/Lymphatic: Atherosclerotic calcifications are present in the aorta and branch vessels. No aneurysm is present. Reproductive: Prostate is unremarkable.  Field Other: No other significant ventral hernia is present. Musculoskeletal: Vertebral body heights are normal. Straightening of the normal lumbar lordosis is present. Mild rightward curvature is present the upper lumbar spine. Endplate degenerative changes are most evident at L5-S1. The SI joints are fused bilaterally. The hips are located and within normal limits bilaterally. IMPRESSION: 1. Fluid and gas extending into the right inguinal canal. This may represent a loop of bowel although direct continuity is unclear. 2. Tiny locules of free air are likely within normal limits following surgery yesterday. 3. Moderate stool throughout the colon suggests constipation. 4. Mild dilation of the pancreatic duct without obstructing lesion. This is likely incidental. 5. Fusion of the SI joints bilaterally compatible with ankylosing spondylitis. 6.  Aortic Atherosclerosis (ICD10-I70.0). These results were called by telephone at the time of interpretation on 11/22/2023 at 6:52 pm to provider [facility] , who verbally acknowledged these results. Electronically Signed   By: Marin Roberts M.D.   On: 11/22/2023 18:52      Labs: BNP (last 3 results) No results for input(s): "BNP" in the last 8760 hours. Basic Metabolic Panel: Recent Labs  Lab 11/24/23 0824 11/25/23 0536 11/26/23 0841 11/28/23 0605  NA 135 138 138 140  K 4.0 3.7 3.6 4.1  CL 99 103 103 104  CO2 25 24 25 25   GLUCOSE 96 79 84 86  BUN 15 17 13 13   CREATININE 0.88 0.77 0.76 0.80  CALCIUM 9.0 8.5* 8.5* 8.9   Liver Function Tests: No results for input(s): "AST", "ALT", "ALKPHOS", "BILITOT", "PROT", "ALBUMIN" in the last 168 hours. No results for input(s): "LIPASE", "AMYLASE" in the last 168 hours. No results for input(s): "AMMONIA" in the last 168 hours. CBC: Recent Labs   Lab 11/24/23 0824 11/25/23 0536 11/26/23 0841 11/28/23 0605  WBC 15.0* 12.0* 8.5 6.9  NEUTROABS 12.8* 9.7* 6.4 4.7  HGB 12.3* 10.9* 10.6* 11.5*  HCT 38.7* 34.6* 33.2* 35.6*  MCV 69.4* 69.9* 69.6* 69.8*  PLT 308 281 281 345   Cardiac Enzymes: No results for input(s): "CKTOTAL", "CKMB", "CKMBINDEX", "TROPONINI" in the last 168 hours. BNP: Invalid input(s): "POCBNP" CBG: Recent Labs  Lab 11/24/23 2337 11/25/23 1200 11/25/23 1719 11/25/23 2355 11/26/23 0633  GLUCAP 93 80 90 99 86   D-Dimer No results for input(s): "DDIMER" in the last 72 hours. Hgb A1c No results for input(s): "HGBA1C" in the last 72 hours. Lipid Profile No results for input(s): "CHOL", "HDL", "LDLCALC", "TRIG", "CHOLHDL", "LDLDIRECT" in the last 72 hours. Thyroid function studies No results for input(s): "TSH", "T4TOTAL", "T3FREE", "THYROIDAB" in the last 72 hours.  Invalid input(s): "FREET3" Anemia work up No results for input(s): "VITAMINB12", "FOLATE", "FERRITIN", "TIBC", "IRON", "RETICCTPCT" in the last 72 hours. Urinalysis    Component Value Date/Time   COLORURINE YELLOW (A) 11/22/2023 1854   APPEARANCEUR CLEAR (A) 11/22/2023 1854   APPEARANCEUR Cloudy (A) 10/26/2021 0840   LABSPEC >1.046 (H)  11/22/2023 1854   PHURINE 6.0 11/22/2023 1854   GLUCOSEU NEGATIVE 11/22/2023 1854   HGBUR NEGATIVE 11/22/2023 1854   BILIRUBINUR NEGATIVE 11/22/2023 1854   BILIRUBINUR Negative 10/26/2021 0840   KETONESUR NEGATIVE 11/22/2023 1854   PROTEINUR NEGATIVE 11/22/2023 1854   UROBILINOGEN 0.2 12/14/2020 0807   NITRITE NEGATIVE 11/22/2023 1854   LEUKOCYTESUR NEGATIVE 11/22/2023 1854   Sepsis Labs Recent Labs  Lab 11/24/23 0824 11/25/23 0536 11/26/23 0841 11/28/23 0605  WBC 15.0* 12.0* 8.5 6.9   Microbiology No results found for this or any previous visit (from the past 240 hour(s)).   Total time spend on discharging this patient, including the last patient exam, discussing the hospital stay,  instructions for ongoing care as it relates to all pertinent caregivers, as well as preparing the medical discharge records, prescriptions, and/or referrals as applicable, is 30 minutes.    Darlin Priestly, MD  [facility] 11/30/2023, 9:55 AM

## 2023-11-30 NOTE — Consult Note (Signed)
[facility] Liaison Note  11/30/2023  Jerry Pena 1951/09/02 [id number]  Location: RN Hospital Liaison met patient at bedside at [facility].  Insurance: [employer]   Jerry Pena is a 72 y.o. male who is a Primary Care Patient of Zane Herald, Rudolpho Sevin, FNP [facility]. The patient was screened for readmission hospitalization with noted low risk score for unplanned readmission risk with 1 IP in 6 months.  The patient was assessed for potential Care Management service needs for post hospital transition for care coordination. Review of patient's electronic medical record reveals patient was admitted for Intractable Nausea and Vomiting. Pt will discharged home with self care. No anticipated needs presented at this time. Liaison attempted visit at bedside however pt has discharged home. No anticipated needs documented to address at this time.   Plan: [facility] Liaison will continue to follow progress and disposition to asess for post hospital community care coordination/management needs.  Referral request for community care coordination: anticipate Transitions of Care Team follow up.   [facility] Care Management/Population Health does not replace or interfere with any arrangements made by the Inpatient Transition of Care team.   For questions contact:   Jerry Cousin, RN, [facility] Liaison [place]   [facility], Population Health Office Hours MTWF  8:00 am-6:00 pm Direct Dial: [phone] mobile [phone] [Office toll free line] Office Hours are M-F 8:30 - 5 pm [email or web address]

## 2023-11-30 NOTE — Plan of Care (Signed)

## 2023-12-03 ENCOUNTER — Telehealth: Payer: Self-pay

## 2023-12-03 NOTE — Transitions of Care (Post Inpatient/ED Visit) (Signed)
   12/03/2023  Name: Jerry Pena MRN: [id number] DOB: 01-13-1951  Today's TOC FU Call Status: Today's TOC FU Call Status:: Successful TOC FU Call Completed TOC FU Call Complete Date: 12/03/23 Patient's Name and Date of Birth confirmed.  Transition Care Management Follow-up Telephone Call Date of Discharge: 11/30/23 Discharge Facility: [facility]) Type of Discharge: Inpatient Admission Primary Inpatient Discharge Diagnosis:: nausea /vomiting How have you been since you were released from the hospital?: Better Any questions or concerns?: No  Items Reviewed: Did you receive and understand the discharge instructions provided?: Yes Medications obtained,verified, and reconciled?: Yes (Medications Reviewed) Any new allergies since your discharge?: No Dietary orders reviewed?: Yes Do you have support at home?: No  Medications Reviewed Today: Medications Reviewed Today     Reviewed by Karena Addison, LPN (Licensed Practical Nurse) on 12/03/23 at [phone]  Med List Status: <None>   Medication Order Taking? Sig Documenting Provider Last Dose Status Informant  calcium-vitamin D (OSCAL WITH D) 500-200 MG-UNIT tablet [id number] No Take 1 tablet by mouth daily. [provider] Past Week unknown Active Self  folic acid (FOLVITE) 400 MCG tablet [id number] No Take 1 tablet (400 mcg total) by mouth daily. Rickard Patience, MD Past Week unknown Active Self  ibuprofen (ADVIL) 200 MG tablet [id number] No Take 2 tablets (400 mg total) by mouth every 6 (six) hours as needed for headache or moderate pain (pain score 4-6). [name], MD prn prn Active Self  Melatonin 5 MG TABS [id number] No Take 10 mg by mouth daily as needed (Sleep). [provider] prn prn Active Self  Multiple Vitamin (MULTIVITAMIN) tablet [id number] No Take 1 tablet by mouth daily. [provider] Past Week unknown Active Self  polyethylene glycol (MIRALAX / GLYCOLAX) 17 g packet  [id number] No Take 17 g by mouth daily. [provider] Past Week unknown Active Self  vitamin B-12 (CYANOCOBALAMIN) 100 MCG tablet [id number] No Take 100 mcg by mouth daily. [provider] Past Week unknown Active Self  vitamin C (ASCORBIC ACID) 500 MG tablet [id number] No Take 500 mg by mouth daily. [provider] Past Week unknown Active Self            Home Care and Equipment/Supplies: Were Home Health Services Ordered?: NA Any new equipment or medical supplies ordered?: NA  Functional Questionnaire: Do you need assistance with bathing/showering or dressing?: No Do you need assistance with meal preparation?: No Do you need assistance with eating?: No Do you have difficulty maintaining continence: No Do you need assistance with getting out of bed/getting out of a chair/moving?: No Do you have difficulty managing or taking your medications?: No  Follow up appointments reviewed: PCP Follow-up appointment confirmed?: Yes Date of PCP follow-up appointment?: 12/06/23 Follow-up Provider: [facility] Follow-up appointment confirmed?: Yes Date of Specialist follow-up appointment?: 12/06/23 Follow-Up Specialty Provider:: surgeon Do you need transportation to your follow-up appointment?: No Do you understand care options if your condition(s) worsen?: Yes-patient verbalized understanding    SIGNATURE Karena Addison, LPN [facility] Nurse Health Advisor Direct Dial [phone]

## 2023-12-04 ENCOUNTER — Telehealth: Payer: Self-pay

## 2023-12-05 DIAGNOSIS — Z8719 Personal history of other diseases of the digestive system: Secondary | ICD-10-CM | POA: Insufficient documentation

## 2023-12-05 NOTE — Progress Notes (Unsigned)
There were no vitals taken for this visit.   Subjective:    Patient ID: Jerry Pena, male    DOB: 1951/02/01, 72 y.o.   MRN: [id number]  HPI: Jerry Pena is a 72 y.o. male  No chief complaint on file.   Discussed the use of AI scribe software for clinical note transcription with the patient, who gave verbal consent to proceed.  History of Present Illness           10/10/2023    1:54 PM 01/26/2023    8:16 AM 10/04/2022    3:37 PM  Depression screen PHQ 2/9  Decreased Interest 0 0 0  Down, Depressed, Hopeless 0 0 0  PHQ - 2 Score 0 0 0  Altered sleeping   0  Tired, decreased energy   0  Change in appetite   0  Feeling bad or failure about yourself    0  Trouble concentrating   0  Moving slowly or fidgety/restless   0  Suicidal thoughts   0  PHQ-9 Score   0  Difficult doing work/chores   Not difficult at all    Relevant past medical, surgical, family and social history reviewed and updated as indicated. Interim medical history since our last visit reviewed. Allergies and medications reviewed and updated.  Review of Systems  Per HPI unless specifically indicated above     Objective:    There were no vitals taken for this visit.  {Vitals History (Optional):23777} Wt Readings from Last 3 Encounters:  11/23/23 125 lb 14.1 oz (57.1 kg)  11/21/23 136 lb (61.7 kg)  11/13/23 136 lb (61.7 kg)    Physical Exam  Results for orders placed or performed during the hospital encounter of 11/22/23  Lipase, blood  Result Value Ref Range   Lipase 397 (H) 11 - 51 U/L  Comprehensive metabolic panel  Result Value Ref Range   Sodium 137 135 - 145 mmol/L   Potassium 4.6 3.5 - 5.1 mmol/L   Chloride 99 98 - 111 mmol/L   CO2 27 22 - 32 mmol/L   Glucose, Bld 134 (H) 70 - 99 mg/dL   BUN 16 8 - 23 mg/dL   Creatinine, Ser 2.13 0.61 - 1.24 mg/dL   Calcium 9.3 8.9 - 08.6 mg/dL   Total Protein 8.1 6.5 - 8.1 g/dL   Albumin 4.4 3.5 - 5.0 g/dL   AST 30 15 - 41 U/L   ALT 13 0 -  44 U/L   Alkaline Phosphatase 85 38 - 126 U/L   Total Bilirubin 1.0 <1.2 mg/dL   GFR, Estimated >57 >84 mL/min   Anion gap 11 5 - 15  CBC  Result Value Ref Range   WBC 13.9 (H) 4.0 - 10.5 K/uL   RBC 5.87 (H) 4.22 - 5.81 MIL/uL   Hemoglobin 13.2 13.0 - 17.0 g/dL   HCT 69.6 29.5 - 28.4 %   MCV 72.1 (L) 80.0 - 100.0 fL   MCH 22.5 (L) 26.0 - 34.0 pg   MCHC 31.2 30.0 - 36.0 g/dL   RDW 13.2 (H) 44.0 - 10.2 %   Platelets 373 150 - 400 K/uL   nRBC 0.0 0.0 - 0.2 %  Urinalysis, Routine w reflex microscopic -Urine, Clean Catch  Result Value Ref Range   Color, Urine YELLOW (A) YELLOW   APPearance CLEAR (A) CLEAR   Specific Gravity, Urine >1.046 (H) 1.005 - 1.030   pH 6.0 5.0 - 8.0   Glucose, UA NEGATIVE  NEGATIVE mg/dL   Hgb urine dipstick NEGATIVE NEGATIVE   Bilirubin Urine NEGATIVE NEGATIVE   Ketones, ur NEGATIVE NEGATIVE mg/dL   Protein, ur NEGATIVE NEGATIVE mg/dL   Nitrite NEGATIVE NEGATIVE   Leukocytes,Ua NEGATIVE NEGATIVE  Basic metabolic panel  Result Value Ref Range   Sodium 138 135 - 145 mmol/L   Potassium 4.0 3.5 - 5.1 mmol/L   Chloride 100 98 - 111 mmol/L   CO2 26 22 - 32 mmol/L   Glucose, Bld 96 70 - 99 mg/dL   BUN 14 8 - 23 mg/dL   Creatinine, Ser 5.40 0.61 - 1.24 mg/dL   Calcium 9.0 8.9 - 98.1 mg/dL   GFR, Estimated >19 >14 mL/min   Anion gap 12 5 - 15  CBC  Result Value Ref Range   WBC 14.5 (H) 4.0 - 10.5 K/uL   RBC 5.03 4.22 - 5.81 MIL/uL   Hemoglobin 11.2 (L) 13.0 - 17.0 g/dL   HCT 78.2 (L) 95.6 - 21.3 %   MCV 70.0 (L) 80.0 - 100.0 fL   MCH 22.3 (L) 26.0 - 34.0 pg   MCHC 31.8 30.0 - 36.0 g/dL   RDW 08.6 (H) 57.8 - 46.9 %   Platelets 326 150 - 400 K/uL   nRBC 0.0 0.0 - 0.2 %  Lipase, blood  Result Value Ref Range   Lipase 143 (H) 11 - 51 U/L  CBC with Differential/Platelet  Result Value Ref Range   WBC 15.0 (H) 4.0 - 10.5 K/uL   RBC 5.58 4.22 - 5.81 MIL/uL   Hemoglobin 12.3 (L) 13.0 - 17.0 g/dL   HCT 62.9 (L) 52.8 - 41.3 %   MCV 69.4 (L) 80.0 - 100.0  fL   MCH 22.0 (L) 26.0 - 34.0 pg   MCHC 31.8 30.0 - 36.0 g/dL   RDW 24.4 (H) 01.0 - 27.2 %   Platelets 308 150 - 400 K/uL   nRBC 0.0 0.0 - 0.2 %   Neutrophils Relative % 85 %   Neutro Abs 12.8 (H) 1.7 - 7.7 K/uL   Lymphocytes Relative 7 %   Lymphs Abs 1.1 0.7 - 4.0 K/uL   Monocytes Relative 7 %   Monocytes Absolute 1.0 0.1 - 1.0 K/uL   Eosinophils Relative 0 %   Eosinophils Absolute 0.0 0.0 - 0.5 K/uL   Basophils Relative 0 %   Basophils Absolute 0.0 0.0 - 0.1 K/uL   Immature Granulocytes 1 %   Abs Immature Granulocytes 0.08 (H) 0.00 - 0.07 K/uL  Basic metabolic panel  Result Value Ref Range   Sodium 135 135 - 145 mmol/L   Potassium 4.0 3.5 - 5.1 mmol/L   Chloride 99 98 - 111 mmol/L   CO2 25 22 - 32 mmol/L   Glucose, Bld 96 70 - 99 mg/dL   BUN 15 8 - 23 mg/dL   Creatinine, Ser 5.36 0.61 - 1.24 mg/dL   Calcium 9.0 8.9 - 64.4 mg/dL   GFR, Estimated >03 >47 mL/min   Anion gap 11 5 - 15  CBC with Differential/Platelet  Result Value Ref Range   WBC 12.0 (H) 4.0 - 10.5 K/uL   RBC 4.95 4.22 - 5.81 MIL/uL   Hemoglobin 10.9 (L) 13.0 - 17.0 g/dL   HCT 42.5 (L) 95.6 - 38.7 %   MCV 69.9 (L) 80.0 - 100.0 fL   MCH 22.0 (L) 26.0 - 34.0 pg   MCHC 31.5 30.0 - 36.0 g/dL   RDW 56.4 (H) 33.2 - 95.1 %  Platelets 281 150 - 400 K/uL   nRBC 0.0 0.0 - 0.2 %   Neutrophils Relative % 81 %   Neutro Abs 9.7 (H) 1.7 - 7.7 K/uL   Lymphocytes Relative 9 %   Lymphs Abs 1.1 0.7 - 4.0 K/uL   Monocytes Relative 9 %   Monocytes Absolute 1.1 (H) 0.1 - 1.0 K/uL   Eosinophils Relative 0 %   Eosinophils Absolute 0.0 0.0 - 0.5 K/uL   Basophils Relative 0 %   Basophils Absolute 0.0 0.0 - 0.1 K/uL   WBC Morphology MORPHOLOGY UNREMARKABLE    Smear Review Normal platelet morphology    Immature Granulocytes 1 %   Abs Immature Granulocytes 0.06 0.00 - 0.07 K/uL   Polychromasia PRESENT   Basic metabolic panel  Result Value Ref Range   Sodium 138 135 - 145 mmol/L   Potassium 3.7 3.5 - 5.1 mmol/L    Chloride 103 98 - 111 mmol/L   CO2 24 22 - 32 mmol/L   Glucose, Bld 79 70 - 99 mg/dL   BUN 17 8 - 23 mg/dL   Creatinine, Ser 1.61 0.61 - 1.24 mg/dL   Calcium 8.5 (L) 8.9 - 10.3 mg/dL   GFR, Estimated >09 >60 mL/min   Anion gap 11 5 - 15  Glucose, capillary  Result Value Ref Range   Glucose-Capillary 82 70 - 99 mg/dL  Glucose, capillary  Result Value Ref Range   Glucose-Capillary 93 70 - 99 mg/dL  Glucose, capillary  Result Value Ref Range   Glucose-Capillary 80 70 - 99 mg/dL  Glucose, capillary  Result Value Ref Range   Glucose-Capillary 90 70 - 99 mg/dL  Glucose, capillary  Result Value Ref Range   Glucose-Capillary 99 70 - 99 mg/dL  Glucose, capillary  Result Value Ref Range   Glucose-Capillary 86 70 - 99 mg/dL  CBC with Differential/Platelet  Result Value Ref Range   WBC 8.5 4.0 - 10.5 K/uL   RBC 4.77 4.22 - 5.81 MIL/uL   Hemoglobin 10.6 (L) 13.0 - 17.0 g/dL   HCT 45.4 (L) 09.8 - 11.9 %   MCV 69.6 (L) 80.0 - 100.0 fL   MCH 22.2 (L) 26.0 - 34.0 pg   MCHC 31.9 30.0 - 36.0 g/dL   RDW 14.7 (H) 82.9 - 56.2 %   Platelets 281 150 - 400 K/uL   nRBC 0.0 0.0 - 0.2 %   Neutrophils Relative % 75 %   Neutro Abs 6.4 1.7 - 7.7 K/uL   Lymphocytes Relative 14 %   Lymphs Abs 1.2 0.7 - 4.0 K/uL   Monocytes Relative 11 %   Monocytes Absolute 0.9 0.1 - 1.0 K/uL   Eosinophils Relative 0 %   Eosinophils Absolute 0.0 0.0 - 0.5 K/uL   Basophils Relative 0 %   Basophils Absolute 0.0 0.0 - 0.1 K/uL   WBC Morphology MORPHOLOGY UNREMARKABLE    RBC Morphology MIXED RBC POPULATION    Smear Review Normal platelet morphology    Immature Granulocytes 0 %   Abs Immature Granulocytes 0.03 0.00 - 0.07 K/uL  Basic metabolic panel  Result Value Ref Range   Sodium 138 135 - 145 mmol/L   Potassium 3.6 3.5 - 5.1 mmol/L   Chloride 103 98 - 111 mmol/L   CO2 25 22 - 32 mmol/L   Glucose, Bld 84 70 - 99 mg/dL   BUN 13 8 - 23 mg/dL   Creatinine, Ser 1.30 0.61 - 1.24 mg/dL   Calcium 8.5 (L) 8.9 -  10.3 mg/dL   GFR, Estimated >16 >10 mL/min   Anion gap 10 5 - 15  CBC with Differential/Platelet  Result Value Ref Range   WBC 6.9 4.0 - 10.5 K/uL   RBC 5.10 4.22 - 5.81 MIL/uL   Hemoglobin 11.5 (L) 13.0 - 17.0 g/dL   HCT 96.0 (L) 45.4 - 09.8 %   MCV 69.8 (L) 80.0 - 100.0 fL   MCH 22.5 (L) 26.0 - 34.0 pg   MCHC 32.3 30.0 - 36.0 g/dL   RDW 11.9 14.7 - 82.9 %   Platelets 345 150 - 400 K/uL   nRBC 0.0 0.0 - 0.2 %   Neutrophils Relative % 68 %   Neutro Abs 4.7 1.7 - 7.7 K/uL   Lymphocytes Relative 20 %   Lymphs Abs 1.4 0.7 - 4.0 K/uL   Monocytes Relative 10 %   Monocytes Absolute 0.7 0.1 - 1.0 K/uL   Eosinophils Relative 1 %   Eosinophils Absolute 0.1 0.0 - 0.5 K/uL   Basophils Relative 1 %   Basophils Absolute 0.0 0.0 - 0.1 K/uL   Immature Granulocytes 0 %   Abs Immature Granulocytes 0.02 0.00 - 0.07 K/uL  Basic metabolic panel  Result Value Ref Range   Sodium 140 135 - 145 mmol/L   Potassium 4.1 3.5 - 5.1 mmol/L   Chloride 104 98 - 111 mmol/L   CO2 25 22 - 32 mmol/L   Glucose, Bld 86 70 - 99 mg/dL   BUN 13 8 - 23 mg/dL   Creatinine, Ser 5.62 0.61 - 1.24 mg/dL   Calcium 8.9 8.9 - 13.0 mg/dL   GFR, Estimated >86 >57 mL/min   Anion gap 11 5 - 15   {Labs (Optional):23779}    Assessment & Plan:   Problem List Items Addressed This Visit   None    Assessment and Plan             Follow up plan: No follow-ups on file.

## 2023-12-05 NOTE — Progress Notes (Unsigned)
[facility] POST-OP OFFICE VISIT  12/06/2023  HPI: Jerry Pena is a 72 y.o. male 15 days s/p robotic right inguinal hernia repair.  He denies any right groin pain or bulge.  Reports he continues to feel some degree of fatigue.  Has some of the expected postoperative tenderness to incisions.  Vital signs: BP 104/68   Pulse (!) 101   Temp 97.7 F (36.5 C) (Oral)   Ht 5\' 8"  (1.727 m)   Wt 124 lb 6.4 oz (56.4 kg)   SpO2 99%   BMI 18.91 kg/m    Physical Exam: Constitutional: He looks well, at his baseline. Abdomen: Upright his abdomen is nice and tight rockhard, no tenderness, no hematoma, incisions are all clean dry and intact. Skin: No groin bulge, nontender.  Assessment/Plan: This is a 72 y.o. male 15 days s/p status post robotic right inguinal hernia repair.  Progressing well.  Reasons for recent hospitalization are fully resolved.  Patient Active Problem List   Diagnosis Date Noted   S/P laparoscopic hernia repair 12/05/2023   Intractable nausea and vomiting 11/22/2023   Idiopathic acute pancreatitis 11/22/2023   Leukocytosis 11/22/2023   Dyslipidemia 11/22/2023   Impingement syndrome of shoulder region 07/07/2021   Osteoarthritis of right shoulder 07/07/2021   Pain in joint, hand 07/07/2021   Lipoma of shoulder 07/07/2021   Right inguinal hernia 10/21/2020   Chronic constipation 10/14/2019   Rectal bleeding 10/14/2019   Radiation proctitis    Migraine 01/23/2019   Cataract 01/23/2019   Beta thalassemia trait 09/27/2018   Neutropenia (HCC) 09/23/2018   History of hormone therapy 06/07/2018   Prostate cancer (HCC) 12/10/2017   Facet hypertrophy of lumbar region 06/13/2017   DDD (degenerative disc disease), lumbar 06/13/2017   Hyperlipidemia 09/09/2015   Sciatica 09/09/2015   Arthritis, lumbar spine 09/09/2015   Tobacco abuse 09/09/2015   History of colonic polyps 09/09/2015    -Follow-up as needed.   [name] M.D., FACS 12/06/2023,  10:03 AM

## 2023-12-06 ENCOUNTER — Other Ambulatory Visit: Payer: Self-pay

## 2023-12-06 ENCOUNTER — Encounter: Payer: Self-pay | Admitting: Nurse Practitioner

## 2023-12-06 ENCOUNTER — Other Ambulatory Visit: Payer: 59

## 2023-12-06 ENCOUNTER — Ambulatory Visit: Payer: 59

## 2023-12-06 ENCOUNTER — Encounter: Payer: Self-pay | Admitting: Surgery

## 2023-12-06 ENCOUNTER — Ambulatory Visit (INDEPENDENT_AMBULATORY_CARE_PROVIDER_SITE_OTHER): Payer: 59 | Admitting: Surgery

## 2023-12-06 ENCOUNTER — Other Ambulatory Visit: Payer: Self-pay | Admitting: Nurse Practitioner

## 2023-12-06 ENCOUNTER — Ambulatory Visit: Payer: 59 | Admitting: Nurse Practitioner

## 2023-12-06 VITALS — BP 104/68 | HR 101 | Temp 97.7°F | Ht 68.0 in | Wt 124.4 lb

## 2023-12-06 VITALS — BP 118/72 | HR 92 | Temp 97.8°F | Resp 12 | Ht 68.0 in | Wt 122.9 lb

## 2023-12-06 DIAGNOSIS — Z09 Encounter for follow-up examination after completed treatment for conditions other than malignant neoplasm: Secondary | ICD-10-CM | POA: Diagnosis not present

## 2023-12-06 DIAGNOSIS — R112 Nausea with vomiting, unspecified: Secondary | ICD-10-CM

## 2023-12-06 DIAGNOSIS — K4090 Unilateral inguinal hernia, without obstruction or gangrene, not specified as recurrent: Secondary | ICD-10-CM

## 2023-12-06 DIAGNOSIS — R1032 Left lower quadrant pain: Secondary | ICD-10-CM

## 2023-12-06 DIAGNOSIS — Z8719 Personal history of other diseases of the digestive system: Secondary | ICD-10-CM

## 2023-12-06 DIAGNOSIS — R1909 Other intra-abdominal and pelvic swelling, mass and lump: Secondary | ICD-10-CM

## 2023-12-06 NOTE — Patient Instructions (Signed)

## 2024-02-12 NOTE — Telephone Encounter (Signed)
Enter error

## 2024-04-24 ENCOUNTER — Other Ambulatory Visit

## 2024-04-24 DIAGNOSIS — C61 Malignant neoplasm of prostate: Secondary | ICD-10-CM

## 2024-04-25 ENCOUNTER — Other Ambulatory Visit: Payer: Self-pay

## 2024-04-25 LAB — PSA: Prostate Specific Ag, Serum: <0.1 ng/mL (ref 0.0–4.0)

## 2024-04-29 ENCOUNTER — Ambulatory Visit (INDEPENDENT_AMBULATORY_CARE_PROVIDER_SITE_OTHER): Payer: Self-pay | Admitting: Urology

## 2024-04-29 VITALS — BP 102/68 | HR 91 | Ht 68.0 in | Wt 122.2 lb

## 2024-04-29 DIAGNOSIS — C61 Malignant neoplasm of prostate: Secondary | ICD-10-CM | POA: Diagnosis not present

## 2024-04-29 NOTE — Progress Notes (Signed)
 I,[name],acting as a scribe for Dustin Gimenez, MD.,have documented all relevant documentation on the behalf of Dustin Gimenez, MD,as directed by  Dustin Gimenez, MD while in the presence of Dustin Gimenez, MD.  04/29/2024 3:04 PM   Jerry Pena 04/16/1951 [id number]  Referring provider: Quinton Buckler, FNP [address] [place],  [place] [address]  Chief Complaint  Patient presents with   Prostate Cancer    HPI: 73 year-old male with a personal history of prostate cancer, radiation cystitis, and urinary incontinence presents today for a routine annual follow-up.   He is s/p prostatectomy/salvage XRT. Surgical pathology consistent with Gleason 4+3 prostate cancer since of extraprostatic extension but negative margins.  In addition to this, 1 of 4 nodes from the right pelvic lymph nodes including external iliac and obturator positive, 0 of 2 on the left. pT3a N1 Mx.   Ultimately, his PSA began to rise up to 0.2 and he underwent whole pelvic radiation in 2019.  He also received one 59-month Depo of Lupron  at that time refused any further secondary to side effects.    He underwent a biopsy on 2/282022 that revealed pathology consistent with radiation cystitis.   He recently underwent a inguinal hernia repair.  His most recent PSA from 04/24/2024 remains undetectable.    He mentions having significant pain post-surgery, leading to a nine-day hospital stay due to a bowel obstruction.   He continues to experience urinary incontinence but prefers to manage it with underwear and bladder guards rather than pursuing further interventions.   He had a mild urinary tract infection (UTI) after his hospital stay, likely related to catheter use during surgery, which resolved with urinary tract pain pills. He reports no current issues with burning or infections. He experienced constipation post-surgery, which may have contributed to the UTI, but he is now back to normal and able  to eat full meals.   PMH: Past Medical History:  Diagnosis Date   Beta thalassemia trait 09/27/2018   Cancer (HCC) 10/2017   Prostate   Cataract    Chronic constipation    Colon polyps    DDD (degenerative disc disease), lumbar 06/13/2017   Lumbar imaging June 2018   Elevated PSA 09/12/2017   Refer to urologist, Sept 2018   Facet hypertrophy of lumbar region 06/13/2017   Lumbar imaging June 2018   GERD (gastroesophageal reflux disease)    occ no meds   Hyperlipidemia    Intractable nausea and vomiting 11/22/2023   Radiation proctitis    Right inguinal hernia 10/2023    Surgical History: Past Surgical History:  Procedure Laterality Date   CATARACT EXTRACTION W/ INTRAOCULAR LENS IMPLANT Left 3/ 28/2014   eyes   CATARACT EXTRACTION W/ INTRAOCULAR LENS IMPLANT Right 04/23/2013   COLONOSCOPY WITH PROPOFOL  N/A 07/29/2019   Procedure: COLONOSCOPY WITH PROPOFOL ;  Surgeon: Selena Daily, MD;  Location: [facility];  Service: Gastroenterology;  Laterality: N/A;   CYSTOSCOPY W/ RETROGRADES Bilateral 02/21/2021   Procedure: CYSTOSCOPY WITH RETROGRADE PYELOGRAM;  Surgeon: Dustin Gimenez, MD;  Location: [facility];  Service: Urology;  Laterality: Bilateral;   CYSTOSCOPY WITH BIOPSY N/A 02/21/2021   Procedure: CYSTOSCOPY WITH BIOPSY and fulgeration;  Surgeon: Dustin Gimenez, MD;  Location: [facility];  Service: Urology;  Laterality: N/A;   FLEXIBLE SIGMOIDOSCOPY N/A 11/14/2019   Procedure: FLEXIBLE SIGMOIDOSCOPY;  Surgeon: Selena Daily, MD;  Location: [facility];  Service: Gastroenterology;  Laterality: N/A;   HERNIA REPAIR  11/21/2023   PELVIC  LYMPH NODE DISSECTION N/A 12/10/2017   Procedure: PELVIC LYMPH NODE DISSECTION;  Surgeon: Dustin Gimenez, MD;  Location: [facility];  Service: Urology;  Laterality: N/A;   ROBOT ASSISTED LAPAROSCOPIC RADICAL PROSTATECTOMY N/A 12/10/2017   Procedure: ROBOTIC ASSISTED LAPAROSCOPIC RADICAL PROSTATECTOMY;  Surgeon: Dustin Gimenez,  MD;  Location: [facility];  Service: Urology;  Laterality: N/A;    Home Medications:  Allergies as of 04/29/2024       Reactions   Aspirin Other (See Comments)   Upset stomach, indigestion and heart burn   Penicillins Hives, Other (See Comments)   Has patient had a PCN reaction causing immediate rash, facial/tongue/throat swelling, SOB or lightheadedness with hypotension: Yes Has patient had a PCN reaction causing severe rash involving mucus membranes or skin necrosis: No Has patient had a PCN reaction that required hospitalization: Yes Has patient had a PCN reaction occurring within the last 10 years: No If all of the above answers are "NO", then may proceed with Cephalosporin use.        Medication List        Accurate as of Apr 29, 2024  3:04 PM. If you have any questions, ask your nurse or doctor.          ascorbic acid 500 MG tablet Commonly known as: VITAMIN C Take 500 mg by mouth daily.   calcium -vitamin D 500-200 MG-UNIT tablet Commonly known as: OSCAL WITH D Take 1 tablet by mouth daily.   folic acid  400 MCG tablet Commonly known as: FOLVITE  Take 1 tablet (400 mcg total) by mouth daily.   ibuprofen  200 MG tablet Commonly known as: ADVIL  Take 2 tablets (400 mg total) by mouth every 6 (six) hours as needed for headache or moderate pain (pain score 4-6).   melatonin 5 MG Tabs Take 10 mg by mouth daily as needed (Sleep).   multivitamin tablet Take 1 tablet by mouth daily.   polyethylene glycol 17 g packet Commonly known as: MIRALAX  / GLYCOLAX  Take 17 g by mouth daily.   vitamin B-12 100 MCG tablet Commonly known as: CYANOCOBALAMIN  Take 100 mcg by mouth daily.        Allergies:  Allergies  Allergen Reactions   Aspirin Other (See Comments)    Upset stomach, indigestion and heart burn   Penicillins Hives and Other (See Comments)    Has patient had a PCN reaction causing immediate rash, facial/tongue/throat swelling, SOB or lightheadedness with  hypotension: Yes Has patient had a PCN reaction causing severe rash involving mucus membranes or skin necrosis: No Has patient had a PCN reaction that required hospitalization: Yes Has patient had a PCN reaction occurring within the last 10 years: No If all of the above answers are "NO", then may proceed with Cephalosporin use.     Family History: Family History  Problem Relation Age of Onset   Stroke Mother    Stroke Father    Cancer Sister        breast, lung   Pulmonary Hypertension Sister    Cancer Son        prostate   Prostate cancer Son    Hypertension Sister    Bladder Cancer Neg Hx    Kidney cancer Neg Hx     Social History:  reports that he quit smoking about 5 months ago. His smoking use included cigars. He has never used smokeless tobacco. He reports that he does not currently use alcohol. He reports that he does not currently use drugs after having used the  following drugs: Marijuana.   Physical Exam: BP 102/68   Pulse 91   Ht 5\' 8"  (1.727 m)   Wt 122 lb 4 oz (55.5 kg)   BMI 18.59 kg/m   Constitutional:  Alert and oriented, No acute distress. HEENT: [place] AT, moist mucus membranes.  Trachea midline, no masses. Neurologic: Grossly intact, no focal deficits, moving all 4 extremities. Psychiatric: Normal mood and affect.   Assessment & Plan:    1. Prostate Cancer - PSA remains undetectable, indicating no current evidence of prostate cancer recurrence. Continue monitoring PSA levels every six months to ensure early detection of any recurrence. Schedule a follow-up visit in one year.  2. Urinary Incontinence - He continues to experience significant urinary incontinence but prefers to manage it with absorbent products rather than pursue further interventions. Discussed the option of a penile clamp, but he declined to pursue further at this time.  No changes to the current management plan.  Return in about 1 year (around 04/29/2025) for PSA.  I have reviewed the  above documentation for accuracy and completeness, and I agree with the above.   Dustin Gimenez, MD   [facility] [address] [place], [place] [address] [phone]

## 2024-09-25 NOTE — Progress Notes (Signed)
 GILMER KAMINSKY                                          MRN: [id number]   09/25/2024   The [facility] Quality Team Specialist reviewed this patient medical record for the purposes of chart review for care gap closure. The following were reviewed: abstraction for care gap closure-colorectal cancer screening.    [facility] Quality Team

## 2024-10-28 ENCOUNTER — Other Ambulatory Visit: Payer: Self-pay

## 2024-10-28 DIAGNOSIS — C61 Malignant neoplasm of prostate: Secondary | ICD-10-CM

## 2024-10-30 ENCOUNTER — Other Ambulatory Visit

## 2024-10-30 DIAGNOSIS — C61 Malignant neoplasm of prostate: Secondary | ICD-10-CM

## 2024-10-31 ENCOUNTER — Ambulatory Visit: Payer: Self-pay | Admitting: Urology

## 2024-10-31 LAB — PSA: Prostate Specific Ag, Serum: <0.1 ng/mL (ref 0.0–4.0)

## 2024-11-06 ENCOUNTER — Ambulatory Visit (INDEPENDENT_AMBULATORY_CARE_PROVIDER_SITE_OTHER)

## 2024-11-06 DIAGNOSIS — Z23 Encounter for immunization: Secondary | ICD-10-CM

## 2024-11-07 ENCOUNTER — Ambulatory Visit

## 2024-11-24 ENCOUNTER — Ambulatory Visit: Payer: Self-pay

## 2024-11-24 NOTE — Telephone Encounter (Signed)
 Pt refused same day appointment d/t needing 3 days to arrange transportation. This RN reviewed rationales for being seen today including risk of loss of circulation to lower extremity. Pt verbalized understanding. He will attempt to arrange a ride to be seen here, UC or the ED sooner. Agreed to call EMS if his pain or edema worsen or if sensation in the foot decreases, currently denies loss or change in sensation.  FYI Only or Action Required?: FYI only for provider: 11/27/24.  Patient was last seen in primary care on 12/06/2023 by Gareth Mliss FALCON, FNP.  Called Nurse Triage reporting Leg Injury.  Symptoms began several days ago.  Interventions attempted: Rest, hydration, or home remedies.  Symptoms are: gradually worsening.  Triage Disposition: See HCP Within 4 Hours (Or PCP Triage) - refused  Patient/caregiver understands and will follow disposition?: No, refuses disposition Reason for Disposition  [1] SEVERE pain (e.g., excruciating pain, unable to do any normal activities) AND [2] not improved 2 hours after pain medicine/ice packs  Answer Assessment - Initial Assessment Questions 1. MECHANISM: How did the injury happen? (e.g., twisting injury, direct blow)      Pinned between wall and love seat 2. ONSET: When did the injury happen? (e.g., minutes, hours ago)      11/18/24 3. LOCATION: Where is the injury located?      Left leg from below knee to foot 4. APPEARANCE of INJURY: What does the injury look like?  (e.g., deformity of leg)     Edematous 5. SEVERITY: Can you put weight on that leg? Can you walk?      A little 6. PAIN: Is there pain? If Yes, ask: How bad is the pain?   What does it keep you from doing? (Scale 0-10; or none, mild, moderate, severe)     9/10 7. TETANUS: For any breaks in the skin, ask: When was your last tetanus booster?     Long scrape, Unknown tetanus 8. OTHER SYMPTOMS: Do you have any other symptoms?      Denies  Protocols used:  Leg Injury-A-AH Copied from CRM #[id number]. Topic: Clinical - Red Word Triage >> Nov 24, 2024  8:01 AM Jerry Pena wrote: Red Word that prompted transfer to Nurse Triage: Patient is calling to report that he was moving furniture- was pinned against the wall with a love seat. Reporting left leg is swollen with pain since last Tuesday.

## 2024-11-27 ENCOUNTER — Encounter: Payer: Self-pay | Admitting: Nurse Practitioner

## 2024-11-27 ENCOUNTER — Ambulatory Visit
Admission: RE | Admit: 2024-11-27 | Discharge: 2024-11-27 | Disposition: A | Source: Ambulatory Visit | Attending: Nurse Practitioner

## 2024-11-27 ENCOUNTER — Ambulatory Visit: Admitting: Nurse Practitioner

## 2024-11-27 VITALS — BP 118/62 | HR 79 | Temp 97.7°F | Ht 68.0 in | Wt 128.0 lb

## 2024-11-27 DIAGNOSIS — M7989 Other specified soft tissue disorders: Secondary | ICD-10-CM

## 2024-11-27 DIAGNOSIS — M79604 Pain in right leg: Secondary | ICD-10-CM | POA: Insufficient documentation

## 2024-11-27 DIAGNOSIS — M79605 Pain in left leg: Secondary | ICD-10-CM

## 2024-11-27 NOTE — Progress Notes (Signed)
 BP 118/62   Pulse 79   Temp 97.7 F (36.5 C)   Ht 5' 8 (1.727 m)   Wt 128 lb (58.1 kg)   SpO2 98%   BMI 19.46 kg/m    Subjective:    Patient ID: Jerry Pena, male    DOB: 05-31-1951, 73 y.o.   MRN: [id number]  HPI: Jerry Pena is a 73 y.o. male  Chief Complaint  Patient presents with   Leg Injury    Pt c/o bilateral leg injury after couch slid down stairs. X1 week ago.    Discussed the use of AI scribe software for clinical note transcription with the patient, who gave verbal consent to proceed.  History of Present Illness Jerry Pena is a 73 year old male who presents with bilateral leg swelling and pain following a crush injury.  Lower extremity swelling and pain - Bilateral leg swelling and pain occurred immediately after a crush injury when a seat fell and struck both legs, crushing against a wall. - Swelling has decreased significantly since the injury, but the right leg remains more swollen than the left. - Pain is present in both legs, especially with pressure applied to certain areas. - Tenderness is noted in the calves. - No pain in the feet despite swelling. - Only a few scratches are present on the legs. No signs of infection  Analgesic use - Ibuprofen  is being used for pain management.  Cardiopulmonary symptoms - No chest pain or shortness of breath         12/06/2023    8:18 AM 10/10/2023    1:54 PM 01/26/2023    8:16 AM  Depression screen PHQ 2/9  Decreased Interest 0 0 0  Down, Depressed, Hopeless 0 0 0  PHQ - 2 Score 0 0 0  Altered sleeping 0    Tired, decreased energy 2    Change in appetite 1    Feeling bad or failure about yourself  0    Trouble concentrating 0    Moving slowly or fidgety/restless 0    Suicidal thoughts 0    PHQ-9 Score 3        Data saved with a previous flowsheet row definition    Relevant past medical, surgical, family and social history reviewed and updated as indicated. Interim medical history since our  last visit reviewed. Allergies and medications reviewed and updated.  Review of Systems  Ten systems reviewed and is negative except as mentioned in HPI      Objective:      BP 118/62   Pulse 79   Temp 97.7 F (36.5 C)   Ht 5' 8 (1.727 m)   Wt 128 lb (58.1 kg)   SpO2 98%   BMI 19.46 kg/m    Wt Readings from Last 3 Encounters:  11/27/24 128 lb (58.1 kg)  04/29/24 122 lb 4 oz (55.5 kg)  12/06/23 124 lb 6.4 oz (56.4 kg)    Physical Exam GENERAL: Alert, cooperative, well developed, no acute distress. HEENT: Normocephalic, normal oropharynx, moist mucous membranes. CHEST: Clear to auscultation bilaterally, no wheezes, rhonchi, or crackles. CARDIOVASCULAR: Normal heart rate and rhythm, S1 and S2 normal without murmurs. ABDOMEN: Soft, non-tender, non-distended, without organomegaly, normal bowel sounds. EXTREMITIES: Swelling in right lower leg, positive Homan's sign on both lower extremities, no signs of infection, no cyanosis. NEUROLOGICAL: Cranial nerves grossly intact, moves all extremities without gross motor or sensory deficit.  Results for orders placed or performed in visit  on 10/30/24  PSA   Collection Time: 10/30/24 10:23 AM  Result Value Ref Range   Prostate Specific Ag, Serum <0.1 0.0 - 4.0 ng/mL          Assessment & Plan:   Problem List Items Addressed This Visit   None Visit Diagnoses       Pain in both lower extremities    -  Primary   Relevant Orders   US  Venous Img Lower Bilateral (DVT)     Right leg swelling       Relevant Orders   US  Venous Img Lower Bilateral (DVT)        Assessment and Plan Assessment & Plan Bilateral lower extremity swelling and pain after crush injury Swelling and pain in both lower extremities following a crush injury. Swelling has decreased but persists, particularly in the right leg. Pain is localized to the calves and feet, with tenderness upon palpation. No signs of infection or significant lacerations.  Differential diagnosis includes deep vein thrombosis (DVT) due to calf tenderness. - Ordered bilateral lower extremity Doppler ultrasound to rule out DVT  Suspected deep vein thrombosis of bilateral lower extremities Concern for DVT due to calf tenderness and swelling in the lower extremities. No signs of infection or other complications. Immediate evaluation is necessary to rule out DVT. - Ordered bilateral lower extremity Doppler ultrasound to rule out DVT        Follow up plan: Return if symptoms worsen or fail to improve.

## 2024-11-28 ENCOUNTER — Ambulatory Visit: Payer: Self-pay | Admitting: Nurse Practitioner

## 2024-11-28 ENCOUNTER — Other Ambulatory Visit: Payer: Self-pay | Admitting: Nurse Practitioner

## 2024-11-28 DIAGNOSIS — M7989 Other specified soft tissue disorders: Secondary | ICD-10-CM

## 2024-11-28 DIAGNOSIS — T148XXA Other injury of unspecified body region, initial encounter: Secondary | ICD-10-CM

## 2024-11-28 DIAGNOSIS — R936 Abnormal findings on diagnostic imaging of limbs: Secondary | ICD-10-CM

## 2024-12-05 ENCOUNTER — Ambulatory Visit: Payer: Self-pay | Admitting: Nurse Practitioner

## 2024-12-05 ENCOUNTER — Ambulatory Visit: Admission: RE | Admit: 2024-12-05 | Discharge: 2024-12-05 | Attending: Nurse Practitioner

## 2024-12-05 DIAGNOSIS — R936 Abnormal findings on diagnostic imaging of limbs: Secondary | ICD-10-CM

## 2024-12-05 DIAGNOSIS — M7989 Other specified soft tissue disorders: Secondary | ICD-10-CM

## 2024-12-05 DIAGNOSIS — T148XXA Other injury of unspecified body region, initial encounter: Secondary | ICD-10-CM

## 2024-12-05 MED ORDER — GADOBUTROL 1 MMOL/ML IV SOLN
5.0000 mL | Freq: Once | INTRAVENOUS | Status: AC | PRN
  Administered 2024-12-05: 5 mL via INTRAVENOUS

## 2024-12-15 ENCOUNTER — Ambulatory Visit: Payer: Self-pay | Admitting: *Deleted

## 2024-12-15 NOTE — Telephone Encounter (Addendum)
 FYI Only or Action Required?: FYI only for provider: appointment scheduled on 12/29.  Patient was last seen in primary care on 11/27/2024 by Jerry Mliss FALCON, FNP.  Called Nurse Triage reporting Ankle Pain.  Symptoms began several weeks ago.  Interventions attempted: OTC medications: Advil .  Symptoms are: unchanged.  Triage Disposition: See PCP When Office is Open (Within 3 Days)  Patient/caregiver understands and will follow disposition?: Yes- offered appointment within disposition- could not come due to transportation- see note  Copied from CRM #[id number]. Topic: Clinical - Red Word Triage >> Dec 15, 2024  2:25 PM Jerry Pena wrote: Red Word that prompted transfer to Nurse Triage: patient has ankle pain rate left ankle a 9 and right ankle is not that bad a 4 or 5 has soreness .states he can hardly push up on his ankle. Reason for Disposition  [1] MODERATE pain (e.g., interferes with normal activities, limping) AND [2] present > 3 days  Answer Assessment - Initial Assessment Questions Offered patient appointment this Wednesday- but he has to rely on transportation- they are closed TH/FR due to holiday- so first available chance he can come to office is Monday- He has scheduled an appointment.  1. ONSET: When did the pain start?      2 weeks ago- patient had OV and US  to rule out DVT 2. LOCATION: Where is the pain located?      Both ankles - left is worse 3. PAIN: How bad is the pain?  (Scale 1-10; or mild, moderate, severe)     L- 9/10,R 4-5/10 4. WORK OR EXERCISE: Has there been any recent work or exercise that involved this part of the body?      Patient states he had accident that caused leg swelling 5. CAUSE: What do you think is causing the ankle pain?      Reports slight swelling- both sides, pain with walking- feels he may have torn tendon/muscle- he has pain in ankle/heel area 6. OTHER SYMPTOMS: Do you have any other symptoms? (e.g., calf pain, rash, fever,  swelling)     no  Protocols used: Ankle Pain-A-AH

## 2024-12-22 ENCOUNTER — Ambulatory Visit (INDEPENDENT_AMBULATORY_CARE_PROVIDER_SITE_OTHER): Admitting: Nurse Practitioner

## 2024-12-22 ENCOUNTER — Encounter: Payer: Self-pay | Admitting: Nurse Practitioner

## 2024-12-22 VITALS — BP 130/88 | HR 88 | Temp 98.0°F | Ht 68.0 in | Wt 130.0 lb

## 2024-12-22 DIAGNOSIS — M25572 Pain in left ankle and joints of left foot: Secondary | ICD-10-CM | POA: Diagnosis not present

## 2024-12-22 DIAGNOSIS — M25571 Pain in right ankle and joints of right foot: Secondary | ICD-10-CM | POA: Diagnosis not present

## 2024-12-22 NOTE — Progress Notes (Signed)
 "  BP 130/88   Pulse 88   Temp 98 F (36.7 C)   Ht 5' 8 (1.727 m)   Wt 130 lb (59 kg)   SpO2 98%   BMI 19.77 kg/m    Subjective:    Patient ID: Jerry Pena, male    DOB: [birth year], 73 y.o.   MRN: [id number]  HPI: Jerry Pena is a 73 y.o. male  Chief Complaint  Patient presents with   Ankle Pain    Pt c/o bilateral ankle pain from cough falling on lower extremities.    Discussed the use of AI scribe software for clinical note transcription with the patient, who gave verbal consent to proceed.  History of Present Illness Jerry Pena is a 73 year old male who presents with bilateral ankle pain following a crush injury.  Bilateral ankle pain and swelling - Severe bilateral ankle pain, left greater than right - Pain localized primarily at the posterior aspect of the ankles - Pain exacerbated by ambulation, resulting in a limp - Unable to stand on toes - Persistent swelling in both ankles, left greater than right - Swelling in the left ankle has decreased but remains more pronounced than the right - Tenderness extending from the ankle to the bone - Concern regarding possible tendon involvement -continues to take ibuprofen , ice and elevate  Lower extremity trauma - Crush injury sustained on November 27, 2024, resulting in initial pain in both lower extremities  Imaging findings - Ultrasound revealed a nonspecific rounded soft tissue mass in the right popliteal fossa - MRI of the right knee demonstrated a hematoma without meniscal or ligamentous injury  Mobility and transportation challenges - [employer] transportation for mobility - Experiences logistical difficulties attending appointments         12/06/2023    8:18 AM 10/10/2023    1:54 PM 01/26/2023    8:16 AM  Depression screen PHQ 2/9  Decreased Interest 0 0 0  Down, Depressed, Hopeless 0 0 0  PHQ - 2 Score 0 0 0  Altered sleeping 0    Tired, decreased energy 2    Change in appetite 1     Feeling bad or failure about yourself  0    Trouble concentrating 0    Moving slowly or fidgety/restless 0    Suicidal thoughts 0    PHQ-9 Score 3        Data saved with a previous flowsheet row definition    Relevant past medical, surgical, family and social history reviewed and updated as indicated. Interim medical history since our last visit reviewed. Allergies and medications reviewed and updated.  Review of Systems  Ten systems reviewed and is negative except as mentioned in HPI      Objective:      BP 130/88   Pulse 88   Temp 98 F (36.7 C)   Ht 5' 8 (1.727 m)   Wt 130 lb (59 kg)   SpO2 98%   BMI 19.77 kg/m    Wt Readings from Last 3 Encounters:  12/22/24 130 lb (59 kg)  11/27/24 128 lb (58.1 kg)  04/29/24 122 lb 4 oz (55.5 kg)    Physical Exam GENERAL: Alert, cooperative, well developed, no acute distress HEENT: Normocephalic, normal oropharynx, moist mucous membranes CHEST: Clear to auscultation bilaterally, No wheezes, rhonchi, or crackles CARDIOVASCULAR: Normal heart rate and rhythm, S1 and S2 normal without murmurs ABDOMEN: Soft, non-tender, non-distended, without organomegaly, Normal bowel sounds EXTREMITIES: No cyanosis,  tenderness in the posterior ankle left greater than right NEUROLOGICAL: Cranial nerves grossly intact, Moves all extremities without gross motor or sensory deficit  Results for orders placed or performed in visit on 10/30/24  PSA   Collection Time: 10/30/24 10:23 AM  Result Value Ref Range   Prostate Specific Ag, Serum <0.1 0.0 - 4.0 ng/mL          Assessment & Plan:   Problem List Items Addressed This Visit   None Visit Diagnoses       Acute bilateral ankle pain    -  Primary   Relevant Orders   Ambulatory referral to Orthopedic Surgery        Assessment and Plan Assessment & Plan Acute bilateral ankle pain Bilateral ankle pain, left worse than right, with tenderness and swelling, particularly in the left  ankle. Pain exacerbated by walking and standing on toes. Concern for potential tendon injury due to previous crush injury. - Referred to orthopedic specialist for further evaluation of potential tendon injury. - Coordinated with Josh to arrange an appointment with orthopedics, preferably today or as soon as possible. Patient reports due to transportation that he can not go till Friday -able to get him an appointment with [facility] on Monday.   -instructed patient to continue icing, ibuprofen  and elevation        Follow up plan: Return if symptoms worsen or fail to improve. "

## 2025-02-06 ENCOUNTER — Ambulatory Visit

## 2025-04-24 ENCOUNTER — Other Ambulatory Visit

## 2025-04-28 ENCOUNTER — Ambulatory Visit: Admitting: Urology
